# Patient Record
Sex: Female | Born: 1970
Health system: Southern US, Community
[De-identification: ages and names within clinical notes are randomized; demographics above are authoritative.]

## PROBLEM LIST (undated history)

## (undated) DIAGNOSIS — K219 Gastro-esophageal reflux disease without esophagitis: Secondary | ICD-10-CM

## (undated) DIAGNOSIS — D573 Sickle-cell trait: Secondary | ICD-10-CM

## (undated) DIAGNOSIS — K279 Peptic ulcer, site unspecified, unspecified as acute or chronic, without hemorrhage or perforation: Secondary | ICD-10-CM

## (undated) DIAGNOSIS — Z5189 Encounter for other specified aftercare: Secondary | ICD-10-CM

## (undated) DIAGNOSIS — D649 Anemia, unspecified: Secondary | ICD-10-CM

## (undated) DIAGNOSIS — G8929 Other chronic pain: Secondary | ICD-10-CM

## (undated) DIAGNOSIS — IMO0002 Reserved for concepts with insufficient information to code with codable children: Secondary | ICD-10-CM

## (undated) DIAGNOSIS — J45909 Unspecified asthma, uncomplicated: Secondary | ICD-10-CM

## (undated) HISTORY — PX: MYRINGOTOMY: SUR874

## (undated) HISTORY — PX: HEMORRHOID SURGERY: SHX153

---

## 1998-01-19 ENCOUNTER — Emergency Department (HOSPITAL_COMMUNITY): Admission: EM | Admit: 1998-01-19 | Discharge: 1998-01-19 | Payer: Self-pay | Admitting: Emergency Medicine

## 1998-03-19 ENCOUNTER — Emergency Department (HOSPITAL_COMMUNITY): Admission: EM | Admit: 1998-03-19 | Discharge: 1998-03-19 | Payer: Self-pay | Admitting: Emergency Medicine

## 1998-05-11 ENCOUNTER — Encounter: Payer: Self-pay | Admitting: Emergency Medicine

## 1998-05-11 ENCOUNTER — Emergency Department (HOSPITAL_COMMUNITY): Admission: EM | Admit: 1998-05-11 | Discharge: 1998-05-11 | Payer: Self-pay | Admitting: Emergency Medicine

## 1998-07-25 ENCOUNTER — Emergency Department (HOSPITAL_COMMUNITY): Admission: EM | Admit: 1998-07-25 | Discharge: 1998-07-25 | Payer: Self-pay | Admitting: Emergency Medicine

## 1998-10-28 ENCOUNTER — Ambulatory Visit (HOSPITAL_COMMUNITY): Admission: RE | Admit: 1998-10-28 | Discharge: 1998-10-28 | Payer: Self-pay | Admitting: Internal Medicine

## 1998-10-28 ENCOUNTER — Encounter: Payer: Self-pay | Admitting: Internal Medicine

## 1998-12-06 ENCOUNTER — Emergency Department (HOSPITAL_COMMUNITY): Admission: EM | Admit: 1998-12-06 | Discharge: 1998-12-07 | Payer: Self-pay

## 1999-02-18 ENCOUNTER — Inpatient Hospital Stay (HOSPITAL_COMMUNITY): Admission: AD | Admit: 1999-02-18 | Discharge: 1999-02-18 | Payer: Self-pay | Admitting: Obstetrics

## 1999-05-06 ENCOUNTER — Emergency Department (HOSPITAL_COMMUNITY): Admission: EM | Admit: 1999-05-06 | Discharge: 1999-05-06 | Payer: Self-pay | Admitting: Emergency Medicine

## 1999-05-09 ENCOUNTER — Emergency Department (HOSPITAL_COMMUNITY): Admission: EM | Admit: 1999-05-09 | Discharge: 1999-05-09 | Payer: Self-pay

## 1999-05-13 ENCOUNTER — Emergency Department (HOSPITAL_COMMUNITY): Admission: EM | Admit: 1999-05-13 | Discharge: 1999-05-13 | Payer: Self-pay | Admitting: Emergency Medicine

## 1999-05-20 ENCOUNTER — Emergency Department (HOSPITAL_COMMUNITY): Admission: EM | Admit: 1999-05-20 | Discharge: 1999-05-20 | Payer: Self-pay | Admitting: Emergency Medicine

## 1999-07-19 ENCOUNTER — Emergency Department (HOSPITAL_COMMUNITY): Admission: EM | Admit: 1999-07-19 | Discharge: 1999-07-19 | Payer: Self-pay | Admitting: *Deleted

## 1999-09-02 ENCOUNTER — Emergency Department (HOSPITAL_COMMUNITY): Admission: EM | Admit: 1999-09-02 | Discharge: 1999-09-03 | Payer: Self-pay | Admitting: Emergency Medicine

## 1999-09-15 ENCOUNTER — Emergency Department (HOSPITAL_COMMUNITY): Admission: EM | Admit: 1999-09-15 | Discharge: 1999-09-15 | Payer: Self-pay | Admitting: Emergency Medicine

## 1999-12-12 ENCOUNTER — Emergency Department (HOSPITAL_COMMUNITY): Admission: EM | Admit: 1999-12-12 | Discharge: 1999-12-13 | Payer: Self-pay | Admitting: Internal Medicine

## 1999-12-13 ENCOUNTER — Encounter: Payer: Self-pay | Admitting: Emergency Medicine

## 2000-03-04 ENCOUNTER — Emergency Department (HOSPITAL_COMMUNITY): Admission: EM | Admit: 2000-03-04 | Discharge: 2000-03-04 | Payer: Self-pay | Admitting: Emergency Medicine

## 2000-09-29 ENCOUNTER — Emergency Department (HOSPITAL_COMMUNITY): Admission: EM | Admit: 2000-09-29 | Discharge: 2000-09-29 | Payer: Self-pay | Admitting: Emergency Medicine

## 2000-12-15 ENCOUNTER — Encounter: Payer: Self-pay | Admitting: Emergency Medicine

## 2000-12-15 ENCOUNTER — Emergency Department (HOSPITAL_COMMUNITY): Admission: EM | Admit: 2000-12-15 | Discharge: 2000-12-15 | Payer: Self-pay | Admitting: Emergency Medicine

## 2001-01-18 ENCOUNTER — Encounter: Payer: Self-pay | Admitting: Emergency Medicine

## 2001-01-18 ENCOUNTER — Emergency Department (HOSPITAL_COMMUNITY): Admission: EM | Admit: 2001-01-18 | Discharge: 2001-01-18 | Payer: Self-pay | Admitting: Emergency Medicine

## 2001-01-23 ENCOUNTER — Emergency Department (HOSPITAL_COMMUNITY): Admission: EM | Admit: 2001-01-23 | Discharge: 2001-01-23 | Payer: Self-pay | Admitting: Emergency Medicine

## 2001-01-27 ENCOUNTER — Emergency Department (HOSPITAL_COMMUNITY): Admission: EM | Admit: 2001-01-27 | Discharge: 2001-01-27 | Payer: Self-pay | Admitting: *Deleted

## 2001-04-06 ENCOUNTER — Emergency Department (HOSPITAL_COMMUNITY): Admission: EM | Admit: 2001-04-06 | Discharge: 2001-04-06 | Payer: Self-pay | Admitting: Emergency Medicine

## 2001-04-13 ENCOUNTER — Encounter: Payer: Self-pay | Admitting: Emergency Medicine

## 2001-04-13 ENCOUNTER — Emergency Department (HOSPITAL_COMMUNITY): Admission: EM | Admit: 2001-04-13 | Discharge: 2001-04-13 | Payer: Self-pay | Admitting: Emergency Medicine

## 2001-07-05 ENCOUNTER — Emergency Department (HOSPITAL_COMMUNITY): Admission: EM | Admit: 2001-07-05 | Discharge: 2001-07-05 | Payer: Self-pay | Admitting: Emergency Medicine

## 2002-02-28 ENCOUNTER — Emergency Department (HOSPITAL_COMMUNITY): Admission: EM | Admit: 2002-02-28 | Discharge: 2002-02-28 | Payer: Self-pay | Admitting: Emergency Medicine

## 2002-05-12 ENCOUNTER — Emergency Department (HOSPITAL_COMMUNITY): Admission: EM | Admit: 2002-05-12 | Discharge: 2002-05-12 | Payer: Self-pay | Admitting: Emergency Medicine

## 2002-11-15 ENCOUNTER — Encounter: Payer: Self-pay | Admitting: Emergency Medicine

## 2002-11-15 ENCOUNTER — Emergency Department (HOSPITAL_COMMUNITY): Admission: EM | Admit: 2002-11-15 | Discharge: 2002-11-15 | Payer: Self-pay | Admitting: Emergency Medicine

## 2002-11-20 ENCOUNTER — Emergency Department (HOSPITAL_COMMUNITY): Admission: EM | Admit: 2002-11-20 | Discharge: 2002-11-20 | Payer: Self-pay | Admitting: Emergency Medicine

## 2002-12-08 ENCOUNTER — Emergency Department (HOSPITAL_COMMUNITY): Admission: EM | Admit: 2002-12-08 | Discharge: 2002-12-08 | Payer: Self-pay

## 2002-12-13 ENCOUNTER — Emergency Department (HOSPITAL_COMMUNITY): Admission: EM | Admit: 2002-12-13 | Discharge: 2002-12-13 | Payer: Self-pay | Admitting: Emergency Medicine

## 2003-01-01 ENCOUNTER — Emergency Department (HOSPITAL_COMMUNITY): Admission: EM | Admit: 2003-01-01 | Discharge: 2003-01-01 | Payer: Self-pay | Admitting: Emergency Medicine

## 2003-01-07 ENCOUNTER — Emergency Department (HOSPITAL_COMMUNITY): Admission: EM | Admit: 2003-01-07 | Discharge: 2003-01-07 | Payer: Self-pay | Admitting: Emergency Medicine

## 2003-01-08 ENCOUNTER — Emergency Department (HOSPITAL_COMMUNITY): Admission: EM | Admit: 2003-01-08 | Discharge: 2003-01-08 | Payer: Self-pay | Admitting: Emergency Medicine

## 2003-01-10 ENCOUNTER — Emergency Department (HOSPITAL_COMMUNITY): Admission: EM | Admit: 2003-01-10 | Discharge: 2003-01-10 | Payer: Self-pay | Admitting: Emergency Medicine

## 2003-02-04 ENCOUNTER — Emergency Department (HOSPITAL_COMMUNITY): Admission: EM | Admit: 2003-02-04 | Discharge: 2003-02-04 | Payer: Self-pay | Admitting: Emergency Medicine

## 2003-02-08 ENCOUNTER — Emergency Department (HOSPITAL_COMMUNITY): Admission: EM | Admit: 2003-02-08 | Discharge: 2003-02-08 | Payer: Self-pay | Admitting: Emergency Medicine

## 2003-03-03 ENCOUNTER — Emergency Department (HOSPITAL_COMMUNITY): Admission: EM | Admit: 2003-03-03 | Discharge: 2003-03-03 | Payer: Self-pay | Admitting: Emergency Medicine

## 2003-04-22 ENCOUNTER — Emergency Department (HOSPITAL_COMMUNITY): Admission: EM | Admit: 2003-04-22 | Discharge: 2003-04-22 | Payer: Self-pay | Admitting: Emergency Medicine

## 2003-07-20 ENCOUNTER — Emergency Department (HOSPITAL_COMMUNITY): Admission: EM | Admit: 2003-07-20 | Discharge: 2003-07-20 | Payer: Self-pay | Admitting: Emergency Medicine

## 2003-08-01 ENCOUNTER — Emergency Department (HOSPITAL_COMMUNITY): Admission: EM | Admit: 2003-08-01 | Discharge: 2003-08-01 | Payer: Self-pay | Admitting: Emergency Medicine

## 2003-10-23 ENCOUNTER — Emergency Department (HOSPITAL_COMMUNITY): Admission: EM | Admit: 2003-10-23 | Discharge: 2003-10-24 | Payer: Self-pay | Admitting: Emergency Medicine

## 2003-10-24 ENCOUNTER — Emergency Department (HOSPITAL_COMMUNITY): Admission: EM | Admit: 2003-10-24 | Discharge: 2003-10-25 | Payer: Self-pay | Admitting: Emergency Medicine

## 2003-11-15 ENCOUNTER — Emergency Department (HOSPITAL_COMMUNITY): Admission: EM | Admit: 2003-11-15 | Discharge: 2003-11-15 | Payer: Self-pay | Admitting: Emergency Medicine

## 2003-11-28 ENCOUNTER — Emergency Department (HOSPITAL_COMMUNITY): Admission: EM | Admit: 2003-11-28 | Discharge: 2003-11-28 | Payer: Self-pay | Admitting: Emergency Medicine

## 2004-01-10 ENCOUNTER — Emergency Department (HOSPITAL_COMMUNITY): Admission: EM | Admit: 2004-01-10 | Discharge: 2004-01-11 | Payer: Self-pay | Admitting: Emergency Medicine

## 2004-01-11 ENCOUNTER — Inpatient Hospital Stay (HOSPITAL_COMMUNITY): Admission: EM | Admit: 2004-01-11 | Discharge: 2004-01-12 | Payer: Self-pay | Admitting: Psychiatry

## 2004-04-07 ENCOUNTER — Emergency Department (HOSPITAL_COMMUNITY): Admission: EM | Admit: 2004-04-07 | Discharge: 2004-04-07 | Payer: Self-pay | Admitting: Emergency Medicine

## 2004-04-12 ENCOUNTER — Emergency Department (HOSPITAL_COMMUNITY): Admission: EM | Admit: 2004-04-12 | Discharge: 2004-04-12 | Payer: Self-pay | Admitting: Emergency Medicine

## 2004-04-18 ENCOUNTER — Emergency Department (HOSPITAL_COMMUNITY): Admission: EM | Admit: 2004-04-18 | Discharge: 2004-04-18 | Payer: Self-pay | Admitting: *Deleted

## 2004-08-21 ENCOUNTER — Emergency Department (HOSPITAL_COMMUNITY): Admission: EM | Admit: 2004-08-21 | Discharge: 2004-08-21 | Payer: Self-pay | Admitting: Emergency Medicine

## 2004-09-02 ENCOUNTER — Emergency Department (HOSPITAL_COMMUNITY): Admission: EM | Admit: 2004-09-02 | Discharge: 2004-09-02 | Payer: Self-pay | Admitting: Emergency Medicine

## 2004-09-10 ENCOUNTER — Emergency Department (HOSPITAL_COMMUNITY): Admission: EM | Admit: 2004-09-10 | Discharge: 2004-09-10 | Payer: Self-pay | Admitting: *Deleted

## 2004-09-19 ENCOUNTER — Emergency Department (HOSPITAL_COMMUNITY): Admission: EM | Admit: 2004-09-19 | Discharge: 2004-09-20 | Payer: Self-pay | Admitting: Specialist

## 2004-09-21 ENCOUNTER — Emergency Department (HOSPITAL_COMMUNITY): Admission: EM | Admit: 2004-09-21 | Discharge: 2004-09-21 | Payer: Self-pay | Admitting: Family Medicine

## 2004-10-08 ENCOUNTER — Ambulatory Visit: Payer: Self-pay | Admitting: Family Medicine

## 2004-10-10 ENCOUNTER — Ambulatory Visit: Payer: Self-pay | Admitting: *Deleted

## 2004-10-14 ENCOUNTER — Ambulatory Visit: Payer: Self-pay | Admitting: Family Medicine

## 2004-10-16 ENCOUNTER — Emergency Department (HOSPITAL_COMMUNITY): Admission: EM | Admit: 2004-10-16 | Discharge: 2004-10-16 | Payer: Self-pay | Admitting: Emergency Medicine

## 2004-10-23 ENCOUNTER — Ambulatory Visit: Payer: Self-pay | Admitting: Family Medicine

## 2004-10-26 ENCOUNTER — Emergency Department (HOSPITAL_COMMUNITY): Admission: EM | Admit: 2004-10-26 | Discharge: 2004-10-26 | Payer: Self-pay | Admitting: Emergency Medicine

## 2004-11-25 ENCOUNTER — Ambulatory Visit: Payer: Self-pay | Admitting: Family Medicine

## 2004-12-31 ENCOUNTER — Ambulatory Visit: Payer: Self-pay | Admitting: Family Medicine

## 2005-01-24 ENCOUNTER — Emergency Department (HOSPITAL_COMMUNITY): Admission: EM | Admit: 2005-01-24 | Discharge: 2005-01-24 | Payer: Self-pay | Admitting: Emergency Medicine

## 2005-01-26 ENCOUNTER — Emergency Department (HOSPITAL_COMMUNITY): Admission: EM | Admit: 2005-01-26 | Discharge: 2005-01-26 | Payer: Self-pay | Admitting: Emergency Medicine

## 2005-02-28 ENCOUNTER — Ambulatory Visit: Payer: Self-pay | Admitting: Internal Medicine

## 2005-04-01 ENCOUNTER — Emergency Department (HOSPITAL_COMMUNITY): Admission: EM | Admit: 2005-04-01 | Discharge: 2005-04-01 | Payer: Self-pay | Admitting: Emergency Medicine

## 2005-04-14 ENCOUNTER — Emergency Department (HOSPITAL_COMMUNITY): Admission: EM | Admit: 2005-04-14 | Discharge: 2005-04-15 | Payer: Self-pay | Admitting: Emergency Medicine

## 2005-04-25 ENCOUNTER — Ambulatory Visit: Payer: Self-pay | Admitting: Internal Medicine

## 2005-05-08 ENCOUNTER — Emergency Department (HOSPITAL_COMMUNITY): Admission: EM | Admit: 2005-05-08 | Discharge: 2005-05-08 | Payer: Self-pay | Admitting: Emergency Medicine

## 2005-05-17 ENCOUNTER — Emergency Department (HOSPITAL_COMMUNITY): Admission: EM | Admit: 2005-05-17 | Discharge: 2005-05-17 | Payer: Self-pay | Admitting: Emergency Medicine

## 2005-07-31 ENCOUNTER — Emergency Department (HOSPITAL_COMMUNITY): Admission: EM | Admit: 2005-07-31 | Discharge: 2005-07-31 | Payer: Self-pay | Admitting: Emergency Medicine

## 2005-08-09 ENCOUNTER — Emergency Department (HOSPITAL_COMMUNITY): Admission: EM | Admit: 2005-08-09 | Discharge: 2005-08-09 | Payer: Self-pay | Admitting: Emergency Medicine

## 2005-11-13 ENCOUNTER — Emergency Department (HOSPITAL_COMMUNITY): Admission: EM | Admit: 2005-11-13 | Discharge: 2005-11-13 | Payer: Self-pay | Admitting: Emergency Medicine

## 2005-11-17 ENCOUNTER — Emergency Department (HOSPITAL_COMMUNITY): Admission: EM | Admit: 2005-11-17 | Discharge: 2005-11-17 | Payer: Self-pay | Admitting: Emergency Medicine

## 2005-12-22 ENCOUNTER — Emergency Department (HOSPITAL_COMMUNITY): Admission: EM | Admit: 2005-12-22 | Discharge: 2005-12-22 | Payer: Self-pay | Admitting: Emergency Medicine

## 2006-01-12 ENCOUNTER — Emergency Department (HOSPITAL_COMMUNITY): Admission: EM | Admit: 2006-01-12 | Discharge: 2006-01-12 | Payer: Self-pay | Admitting: Emergency Medicine

## 2006-02-11 ENCOUNTER — Ambulatory Visit: Payer: Self-pay | Admitting: Internal Medicine

## 2006-02-24 ENCOUNTER — Emergency Department (HOSPITAL_COMMUNITY): Admission: EM | Admit: 2006-02-24 | Discharge: 2006-02-25 | Payer: Self-pay | Admitting: Emergency Medicine

## 2006-02-24 ENCOUNTER — Emergency Department (HOSPITAL_COMMUNITY): Admission: EM | Admit: 2006-02-24 | Discharge: 2006-02-24 | Payer: Self-pay | Admitting: Emergency Medicine

## 2006-03-04 ENCOUNTER — Emergency Department (HOSPITAL_COMMUNITY): Admission: EM | Admit: 2006-03-04 | Discharge: 2006-03-04 | Payer: Self-pay | Admitting: Emergency Medicine

## 2006-03-11 ENCOUNTER — Emergency Department (HOSPITAL_COMMUNITY): Admission: EM | Admit: 2006-03-11 | Discharge: 2006-03-11 | Payer: Self-pay | Admitting: Emergency Medicine

## 2006-03-25 ENCOUNTER — Ambulatory Visit: Payer: Self-pay | Admitting: Family Medicine

## 2006-07-09 ENCOUNTER — Emergency Department (HOSPITAL_COMMUNITY): Admission: EM | Admit: 2006-07-09 | Discharge: 2006-07-09 | Payer: Self-pay | Admitting: Emergency Medicine

## 2006-07-13 ENCOUNTER — Emergency Department (HOSPITAL_COMMUNITY): Admission: EM | Admit: 2006-07-13 | Discharge: 2006-07-13 | Payer: Self-pay | Admitting: Emergency Medicine

## 2006-07-20 ENCOUNTER — Emergency Department (HOSPITAL_COMMUNITY): Admission: EM | Admit: 2006-07-20 | Discharge: 2006-07-20 | Payer: Self-pay | Admitting: Emergency Medicine

## 2006-08-02 ENCOUNTER — Emergency Department (HOSPITAL_COMMUNITY): Admission: EM | Admit: 2006-08-02 | Discharge: 2006-08-02 | Payer: Self-pay | Admitting: Emergency Medicine

## 2006-08-11 ENCOUNTER — Emergency Department (HOSPITAL_COMMUNITY): Admission: EM | Admit: 2006-08-11 | Discharge: 2006-08-11 | Payer: Self-pay | Admitting: Emergency Medicine

## 2006-08-12 ENCOUNTER — Emergency Department (HOSPITAL_COMMUNITY): Admission: EM | Admit: 2006-08-12 | Discharge: 2006-08-12 | Payer: Self-pay | Admitting: Family Medicine

## 2006-11-21 ENCOUNTER — Emergency Department (HOSPITAL_COMMUNITY): Admission: EM | Admit: 2006-11-21 | Discharge: 2006-11-21 | Payer: Self-pay | Admitting: Emergency Medicine

## 2006-12-06 ENCOUNTER — Emergency Department (HOSPITAL_COMMUNITY): Admission: EM | Admit: 2006-12-06 | Discharge: 2006-12-06 | Payer: Self-pay | Admitting: Emergency Medicine

## 2006-12-07 ENCOUNTER — Ambulatory Visit: Payer: Self-pay | Admitting: Internal Medicine

## 2006-12-08 LAB — CONVERTED CEMR LAB
ALT: 8 units/L
AST: 15 units/L
Albumin: 4.4 g/dL
Alkaline Phosphatase: 75 units/L
BUN: 12 mg/dL
Basophils Absolute: 0 10*3/uL
Basophils Relative: 1 %
CO2: 16 meq/L
Calcium: 9.4 mg/dL
Chloride: 111 meq/L
Cholesterol: 186 mg/dL
Creatinine, Ser: 0.54 mg/dL
Eosinophils Absolute: 0 10*3/uL
Eosinophils Relative: 1 %
Glucose, Bld: 68 mg/dL
HCT: 38.4 %
HDL: 55 mg/dL
Hemoglobin: 13 g/dL
LDL Cholesterol: 120 mg/dL
Lymphocytes Relative: 41 %
Lymphs Abs: 2.6 10*3/uL
MCHC: 33.9 g/dL
MCV: 90.1 fL
Monocytes Absolute: 1 10*3/uL
Monocytes Relative: 0.4 %
Neutro Abs: 3.2 10*3/uL
Neutrophils Relative %: 51 %
Platelets: 424 10*3/uL
Potassium: 4.1 meq/L
RBC: 4.26 M/uL
RDW: 14.6 %
Sodium: 142 meq/L
Total Bilirubin: 0.4 mg/dL
Total CHOL/HDL Ratio: 3.4
Total Protein: 7.3 g/dL
Triglycerides: 53 mg/dL
VLDL: 11 mg/dL
WBC: 6.3 10*3/uL

## 2007-01-05 ENCOUNTER — Ambulatory Visit: Payer: Self-pay | Admitting: Internal Medicine

## 2007-02-05 ENCOUNTER — Emergency Department (HOSPITAL_COMMUNITY): Admission: EM | Admit: 2007-02-05 | Discharge: 2007-02-05 | Payer: Self-pay | Admitting: Emergency Medicine

## 2007-02-13 ENCOUNTER — Emergency Department (HOSPITAL_COMMUNITY): Admission: EM | Admit: 2007-02-13 | Discharge: 2007-02-13 | Payer: Self-pay | Admitting: Emergency Medicine

## 2007-02-16 ENCOUNTER — Emergency Department (HOSPITAL_COMMUNITY): Admission: EM | Admit: 2007-02-16 | Discharge: 2007-02-16 | Payer: Self-pay | Admitting: Emergency Medicine

## 2007-02-25 DIAGNOSIS — L259 Unspecified contact dermatitis, unspecified cause: Secondary | ICD-10-CM | POA: Insufficient documentation

## 2007-02-25 DIAGNOSIS — G43809 Other migraine, not intractable, without status migrainosus: Secondary | ICD-10-CM | POA: Insufficient documentation

## 2007-03-01 ENCOUNTER — Telehealth (INDEPENDENT_AMBULATORY_CARE_PROVIDER_SITE_OTHER): Payer: Self-pay | Admitting: Internal Medicine

## 2007-03-03 ENCOUNTER — Encounter (INDEPENDENT_AMBULATORY_CARE_PROVIDER_SITE_OTHER): Payer: Self-pay | Admitting: *Deleted

## 2007-03-06 ENCOUNTER — Emergency Department (HOSPITAL_COMMUNITY): Admission: EM | Admit: 2007-03-06 | Discharge: 2007-03-06 | Payer: Self-pay | Admitting: Emergency Medicine

## 2007-03-09 ENCOUNTER — Ambulatory Visit: Payer: Self-pay | Admitting: Nurse Practitioner

## 2007-03-09 DIAGNOSIS — K047 Periapical abscess without sinus: Secondary | ICD-10-CM | POA: Insufficient documentation

## 2007-03-09 DIAGNOSIS — F17200 Nicotine dependence, unspecified, uncomplicated: Secondary | ICD-10-CM | POA: Insufficient documentation

## 2007-03-09 DIAGNOSIS — K219 Gastro-esophageal reflux disease without esophagitis: Secondary | ICD-10-CM | POA: Insufficient documentation

## 2007-03-09 DIAGNOSIS — G43909 Migraine, unspecified, not intractable, without status migrainosus: Secondary | ICD-10-CM | POA: Insufficient documentation

## 2007-03-24 ENCOUNTER — Telehealth (INDEPENDENT_AMBULATORY_CARE_PROVIDER_SITE_OTHER): Payer: Self-pay | Admitting: Internal Medicine

## 2007-04-09 ENCOUNTER — Telehealth (INDEPENDENT_AMBULATORY_CARE_PROVIDER_SITE_OTHER): Payer: Self-pay | Admitting: Nurse Practitioner

## 2007-04-10 ENCOUNTER — Emergency Department (HOSPITAL_COMMUNITY): Admission: EM | Admit: 2007-04-10 | Discharge: 2007-04-10 | Payer: Self-pay | Admitting: Emergency Medicine

## 2007-04-14 ENCOUNTER — Ambulatory Visit: Payer: Self-pay | Admitting: Nurse Practitioner

## 2007-04-14 LAB — CONVERTED CEMR LAB
Bilirubin Urine: NEGATIVE
Chlamydia, DNA Probe: NEGATIVE
GC Probe Amp, Genital: NEGATIVE
Glucose, Urine, Semiquant: NEGATIVE
KOH Prep: NEGATIVE
Ketones, urine, test strip: NEGATIVE
Nitrite: NEGATIVE
Protein, U semiquant: NEGATIVE
Specific Gravity, Urine: 1.01
Urobilinogen, UA: NEGATIVE
WBC Urine, dipstick: NEGATIVE
Whiff Test: NEGATIVE
pH: 6.5

## 2007-04-15 ENCOUNTER — Encounter (INDEPENDENT_AMBULATORY_CARE_PROVIDER_SITE_OTHER): Payer: Self-pay | Admitting: Nurse Practitioner

## 2007-04-15 LAB — CONVERTED CEMR LAB: Pap Smear: NORMAL

## 2007-04-17 ENCOUNTER — Emergency Department (HOSPITAL_COMMUNITY): Admission: EM | Admit: 2007-04-17 | Discharge: 2007-04-17 | Payer: Self-pay | Admitting: Emergency Medicine

## 2007-04-20 ENCOUNTER — Encounter (INDEPENDENT_AMBULATORY_CARE_PROVIDER_SITE_OTHER): Payer: Self-pay | Admitting: Nurse Practitioner

## 2007-06-22 ENCOUNTER — Telehealth (INDEPENDENT_AMBULATORY_CARE_PROVIDER_SITE_OTHER): Payer: Self-pay | Admitting: *Deleted

## 2007-07-22 ENCOUNTER — Ambulatory Visit: Payer: Self-pay | Admitting: Nurse Practitioner

## 2007-07-22 ENCOUNTER — Emergency Department (HOSPITAL_COMMUNITY): Admission: EM | Admit: 2007-07-22 | Discharge: 2007-07-22 | Payer: Self-pay | Admitting: Emergency Medicine

## 2007-07-23 ENCOUNTER — Emergency Department (HOSPITAL_COMMUNITY): Admission: EM | Admit: 2007-07-23 | Discharge: 2007-07-23 | Payer: Self-pay | Admitting: Emergency Medicine

## 2007-07-24 ENCOUNTER — Emergency Department (HOSPITAL_COMMUNITY): Admission: EM | Admit: 2007-07-24 | Discharge: 2007-07-24 | Payer: Self-pay | Admitting: Emergency Medicine

## 2007-08-27 ENCOUNTER — Ambulatory Visit: Payer: Self-pay | Admitting: Nurse Practitioner

## 2007-08-27 DIAGNOSIS — R0602 Shortness of breath: Secondary | ICD-10-CM | POA: Insufficient documentation

## 2007-10-12 ENCOUNTER — Encounter (INDEPENDENT_AMBULATORY_CARE_PROVIDER_SITE_OTHER): Payer: Self-pay | Admitting: Nurse Practitioner

## 2007-12-07 ENCOUNTER — Emergency Department (HOSPITAL_COMMUNITY): Admission: EM | Admit: 2007-12-07 | Discharge: 2007-12-07 | Payer: Self-pay | Admitting: Emergency Medicine

## 2007-12-09 ENCOUNTER — Emergency Department (HOSPITAL_COMMUNITY): Admission: EM | Admit: 2007-12-09 | Discharge: 2007-12-09 | Payer: Self-pay | Admitting: Emergency Medicine

## 2007-12-13 ENCOUNTER — Emergency Department (HOSPITAL_COMMUNITY): Admission: EM | Admit: 2007-12-13 | Discharge: 2007-12-13 | Payer: Self-pay | Admitting: Emergency Medicine

## 2007-12-14 ENCOUNTER — Emergency Department (HOSPITAL_COMMUNITY): Admission: EM | Admit: 2007-12-14 | Discharge: 2007-12-14 | Payer: Self-pay | Admitting: Emergency Medicine

## 2007-12-19 ENCOUNTER — Emergency Department (HOSPITAL_COMMUNITY): Admission: EM | Admit: 2007-12-19 | Discharge: 2007-12-19 | Payer: Self-pay | Admitting: Emergency Medicine

## 2008-01-12 ENCOUNTER — Telehealth (INDEPENDENT_AMBULATORY_CARE_PROVIDER_SITE_OTHER): Payer: Self-pay | Admitting: Nurse Practitioner

## 2008-01-15 ENCOUNTER — Emergency Department (HOSPITAL_COMMUNITY): Admission: EM | Admit: 2008-01-15 | Discharge: 2008-01-15 | Payer: Self-pay | Admitting: Emergency Medicine

## 2008-01-20 ENCOUNTER — Ambulatory Visit: Payer: Self-pay | Admitting: Nurse Practitioner

## 2008-01-20 DIAGNOSIS — K5900 Constipation, unspecified: Secondary | ICD-10-CM | POA: Insufficient documentation

## 2008-01-21 ENCOUNTER — Ambulatory Visit (HOSPITAL_COMMUNITY): Admission: RE | Admit: 2008-01-21 | Discharge: 2008-01-21 | Payer: Self-pay | Admitting: Nurse Practitioner

## 2008-01-24 ENCOUNTER — Ambulatory Visit (HOSPITAL_COMMUNITY): Admission: RE | Admit: 2008-01-24 | Discharge: 2008-01-24 | Payer: Self-pay | Admitting: Neurosurgery

## 2008-01-25 ENCOUNTER — Telehealth (INDEPENDENT_AMBULATORY_CARE_PROVIDER_SITE_OTHER): Payer: Self-pay | Admitting: Nurse Practitioner

## 2008-03-23 ENCOUNTER — Ambulatory Visit: Payer: Self-pay | Admitting: Nurse Practitioner

## 2008-03-23 DIAGNOSIS — J309 Allergic rhinitis, unspecified: Secondary | ICD-10-CM | POA: Insufficient documentation

## 2008-04-04 ENCOUNTER — Encounter (INDEPENDENT_AMBULATORY_CARE_PROVIDER_SITE_OTHER): Payer: Self-pay | Admitting: Nurse Practitioner

## 2008-04-12 ENCOUNTER — Encounter (INDEPENDENT_AMBULATORY_CARE_PROVIDER_SITE_OTHER): Payer: Self-pay | Admitting: Nurse Practitioner

## 2008-04-12 ENCOUNTER — Ambulatory Visit (HOSPITAL_COMMUNITY): Admission: RE | Admit: 2008-04-12 | Discharge: 2008-04-12 | Payer: Self-pay | Admitting: Gastroenterology

## 2008-04-21 ENCOUNTER — Telehealth (INDEPENDENT_AMBULATORY_CARE_PROVIDER_SITE_OTHER): Payer: Self-pay | Admitting: Nurse Practitioner

## 2008-05-16 ENCOUNTER — Telehealth (INDEPENDENT_AMBULATORY_CARE_PROVIDER_SITE_OTHER): Payer: Self-pay | Admitting: Nurse Practitioner

## 2008-06-06 ENCOUNTER — Encounter (INDEPENDENT_AMBULATORY_CARE_PROVIDER_SITE_OTHER): Payer: Self-pay | Admitting: Nurse Practitioner

## 2008-06-07 ENCOUNTER — Encounter (INDEPENDENT_AMBULATORY_CARE_PROVIDER_SITE_OTHER): Payer: Self-pay | Admitting: Nurse Practitioner

## 2008-06-07 DIAGNOSIS — R1013 Epigastric pain: Secondary | ICD-10-CM

## 2008-06-07 DIAGNOSIS — K3189 Other diseases of stomach and duodenum: Secondary | ICD-10-CM | POA: Insufficient documentation

## 2008-06-14 ENCOUNTER — Encounter (INDEPENDENT_AMBULATORY_CARE_PROVIDER_SITE_OTHER): Payer: Self-pay | Admitting: *Deleted

## 2008-06-26 ENCOUNTER — Telehealth (INDEPENDENT_AMBULATORY_CARE_PROVIDER_SITE_OTHER): Payer: Self-pay | Admitting: Nurse Practitioner

## 2008-07-05 ENCOUNTER — Encounter (INDEPENDENT_AMBULATORY_CARE_PROVIDER_SITE_OTHER): Payer: Self-pay | Admitting: Nurse Practitioner

## 2008-07-05 ENCOUNTER — Ambulatory Visit (HOSPITAL_COMMUNITY): Admission: RE | Admit: 2008-07-05 | Discharge: 2008-07-05 | Payer: Self-pay | Admitting: Internal Medicine

## 2008-07-07 ENCOUNTER — Ambulatory Visit: Payer: Self-pay | Admitting: Nurse Practitioner

## 2008-07-14 ENCOUNTER — Emergency Department (HOSPITAL_COMMUNITY): Admission: EM | Admit: 2008-07-14 | Discharge: 2008-07-14 | Payer: Self-pay | Admitting: Nurse Practitioner

## 2008-09-19 ENCOUNTER — Emergency Department (HOSPITAL_COMMUNITY): Admission: EM | Admit: 2008-09-19 | Discharge: 2008-09-19 | Payer: Self-pay | Admitting: Emergency Medicine

## 2008-09-25 ENCOUNTER — Emergency Department (HOSPITAL_COMMUNITY): Admission: EM | Admit: 2008-09-25 | Discharge: 2008-09-25 | Payer: Self-pay | Admitting: Emergency Medicine

## 2008-09-26 ENCOUNTER — Telehealth (INDEPENDENT_AMBULATORY_CARE_PROVIDER_SITE_OTHER): Payer: Self-pay | Admitting: Nurse Practitioner

## 2008-09-28 ENCOUNTER — Ambulatory Visit: Payer: Self-pay | Admitting: Nurse Practitioner

## 2008-09-28 ENCOUNTER — Emergency Department (HOSPITAL_COMMUNITY): Admission: EM | Admit: 2008-09-28 | Discharge: 2008-09-28 | Payer: Self-pay | Admitting: Emergency Medicine

## 2008-10-03 ENCOUNTER — Telehealth (INDEPENDENT_AMBULATORY_CARE_PROVIDER_SITE_OTHER): Payer: Self-pay | Admitting: Nurse Practitioner

## 2008-10-03 ENCOUNTER — Emergency Department (HOSPITAL_COMMUNITY): Admission: EM | Admit: 2008-10-03 | Discharge: 2008-10-03 | Payer: Self-pay | Admitting: Emergency Medicine

## 2008-10-06 ENCOUNTER — Encounter (INDEPENDENT_AMBULATORY_CARE_PROVIDER_SITE_OTHER): Payer: Self-pay | Admitting: Nurse Practitioner

## 2008-10-20 ENCOUNTER — Telehealth (INDEPENDENT_AMBULATORY_CARE_PROVIDER_SITE_OTHER): Payer: Self-pay | Admitting: Nurse Practitioner

## 2008-10-23 ENCOUNTER — Emergency Department (HOSPITAL_COMMUNITY): Admission: EM | Admit: 2008-10-23 | Discharge: 2008-10-23 | Payer: Self-pay | Admitting: Emergency Medicine

## 2008-10-23 ENCOUNTER — Telehealth (INDEPENDENT_AMBULATORY_CARE_PROVIDER_SITE_OTHER): Payer: Self-pay | Admitting: Nurse Practitioner

## 2008-10-25 ENCOUNTER — Encounter (INDEPENDENT_AMBULATORY_CARE_PROVIDER_SITE_OTHER): Payer: Self-pay | Admitting: *Deleted

## 2008-10-26 ENCOUNTER — Encounter (INDEPENDENT_AMBULATORY_CARE_PROVIDER_SITE_OTHER): Payer: Self-pay | Admitting: Nurse Practitioner

## 2008-11-01 ENCOUNTER — Ambulatory Visit: Payer: Self-pay | Admitting: Nurse Practitioner

## 2008-11-01 DIAGNOSIS — E876 Hypokalemia: Secondary | ICD-10-CM | POA: Insufficient documentation

## 2008-11-01 LAB — CONVERTED CEMR LAB
BUN: 9 mg/dL (ref 6–23)
CO2: 24 meq/L (ref 19–32)
Calcium: 9.4 mg/dL (ref 8.4–10.5)
Chloride: 105 meq/L (ref 96–112)
Creatinine, Ser: 0.63 mg/dL (ref 0.40–1.20)
Glucose, Bld: 98 mg/dL (ref 70–99)
Potassium: 4.1 meq/L (ref 3.5–5.3)
Sodium: 140 meq/L (ref 135–145)

## 2008-11-02 ENCOUNTER — Encounter (INDEPENDENT_AMBULATORY_CARE_PROVIDER_SITE_OTHER): Payer: Self-pay | Admitting: Nurse Practitioner

## 2008-11-09 ENCOUNTER — Encounter (INDEPENDENT_AMBULATORY_CARE_PROVIDER_SITE_OTHER): Payer: Self-pay | Admitting: *Deleted

## 2008-11-16 ENCOUNTER — Telehealth (INDEPENDENT_AMBULATORY_CARE_PROVIDER_SITE_OTHER): Payer: Self-pay | Admitting: Nurse Practitioner

## 2008-11-27 ENCOUNTER — Telehealth (INDEPENDENT_AMBULATORY_CARE_PROVIDER_SITE_OTHER): Payer: Self-pay | Admitting: Nurse Practitioner

## 2009-01-09 ENCOUNTER — Emergency Department (HOSPITAL_COMMUNITY): Admission: EM | Admit: 2009-01-09 | Discharge: 2009-01-09 | Payer: Self-pay | Admitting: Emergency Medicine

## 2009-01-10 ENCOUNTER — Emergency Department (HOSPITAL_BASED_OUTPATIENT_CLINIC_OR_DEPARTMENT_OTHER): Admission: EM | Admit: 2009-01-10 | Discharge: 2009-01-10 | Payer: Self-pay | Admitting: Emergency Medicine

## 2009-01-12 ENCOUNTER — Emergency Department (HOSPITAL_COMMUNITY): Admission: EM | Admit: 2009-01-12 | Discharge: 2009-01-12 | Payer: Self-pay | Admitting: Emergency Medicine

## 2009-01-23 ENCOUNTER — Ambulatory Visit: Payer: Self-pay | Admitting: Nurse Practitioner

## 2009-01-23 DIAGNOSIS — G4700 Insomnia, unspecified: Secondary | ICD-10-CM | POA: Insufficient documentation

## 2009-01-24 ENCOUNTER — Telehealth (INDEPENDENT_AMBULATORY_CARE_PROVIDER_SITE_OTHER): Payer: Self-pay | Admitting: Nurse Practitioner

## 2009-01-28 ENCOUNTER — Emergency Department (HOSPITAL_COMMUNITY): Admission: EM | Admit: 2009-01-28 | Discharge: 2009-01-28 | Payer: Self-pay | Admitting: Emergency Medicine

## 2009-01-30 ENCOUNTER — Emergency Department (HOSPITAL_COMMUNITY): Admission: EM | Admit: 2009-01-30 | Discharge: 2009-01-30 | Payer: Self-pay | Admitting: Emergency Medicine

## 2009-02-26 ENCOUNTER — Encounter (INDEPENDENT_AMBULATORY_CARE_PROVIDER_SITE_OTHER): Payer: Self-pay | Admitting: Nurse Practitioner

## 2009-03-05 ENCOUNTER — Emergency Department (HOSPITAL_COMMUNITY): Admission: EM | Admit: 2009-03-05 | Discharge: 2009-03-05 | Payer: Self-pay | Admitting: Emergency Medicine

## 2009-03-09 ENCOUNTER — Emergency Department (HOSPITAL_COMMUNITY): Admission: EM | Admit: 2009-03-09 | Discharge: 2009-03-09 | Payer: Self-pay | Admitting: Emergency Medicine

## 2009-03-16 ENCOUNTER — Telehealth (INDEPENDENT_AMBULATORY_CARE_PROVIDER_SITE_OTHER): Payer: Self-pay | Admitting: Nurse Practitioner

## 2009-03-21 ENCOUNTER — Emergency Department (HOSPITAL_BASED_OUTPATIENT_CLINIC_OR_DEPARTMENT_OTHER): Admission: EM | Admit: 2009-03-21 | Discharge: 2009-03-22 | Payer: Self-pay | Admitting: Emergency Medicine

## 2009-03-22 ENCOUNTER — Ambulatory Visit: Payer: Self-pay | Admitting: Diagnostic Radiology

## 2009-03-22 ENCOUNTER — Ambulatory Visit: Payer: Self-pay | Admitting: Nurse Practitioner

## 2009-04-02 ENCOUNTER — Emergency Department (HOSPITAL_BASED_OUTPATIENT_CLINIC_OR_DEPARTMENT_OTHER): Admission: EM | Admit: 2009-04-02 | Discharge: 2009-04-02 | Payer: Self-pay | Admitting: Emergency Medicine

## 2009-04-13 ENCOUNTER — Encounter (INDEPENDENT_AMBULATORY_CARE_PROVIDER_SITE_OTHER): Payer: Self-pay | Admitting: Nurse Practitioner

## 2009-08-23 ENCOUNTER — Emergency Department (HOSPITAL_COMMUNITY): Admission: EM | Admit: 2009-08-23 | Discharge: 2009-08-24 | Payer: Self-pay | Admitting: Emergency Medicine

## 2009-09-06 ENCOUNTER — Ambulatory Visit: Payer: Self-pay | Admitting: Nurse Practitioner

## 2009-09-06 DIAGNOSIS — E669 Obesity, unspecified: Secondary | ICD-10-CM | POA: Insufficient documentation

## 2009-09-06 LAB — CONVERTED CEMR LAB
Bilirubin Urine: NEGATIVE
Glucose, Urine, Semiquant: NEGATIVE
Ketones, urine, test strip: NEGATIVE
Nitrite: NEGATIVE
Protein, U semiquant: NEGATIVE
Specific Gravity, Urine: 1.02
Urobilinogen, UA: 1
WBC Urine, dipstick: NEGATIVE
pH: 5.5

## 2009-09-12 ENCOUNTER — Encounter (INDEPENDENT_AMBULATORY_CARE_PROVIDER_SITE_OTHER): Payer: Self-pay | Admitting: Nurse Practitioner

## 2009-09-18 ENCOUNTER — Emergency Department (HOSPITAL_COMMUNITY): Admission: EM | Admit: 2009-09-18 | Discharge: 2009-09-18 | Payer: Self-pay | Admitting: Emergency Medicine

## 2009-09-19 ENCOUNTER — Encounter (INDEPENDENT_AMBULATORY_CARE_PROVIDER_SITE_OTHER): Payer: Self-pay | Admitting: *Deleted

## 2009-09-21 ENCOUNTER — Encounter (INDEPENDENT_AMBULATORY_CARE_PROVIDER_SITE_OTHER): Payer: Self-pay | Admitting: Nurse Practitioner

## 2009-09-22 ENCOUNTER — Emergency Department (HOSPITAL_BASED_OUTPATIENT_CLINIC_OR_DEPARTMENT_OTHER): Admission: EM | Admit: 2009-09-22 | Discharge: 2009-09-22 | Payer: Self-pay | Admitting: Emergency Medicine

## 2009-10-02 ENCOUNTER — Emergency Department (HOSPITAL_COMMUNITY): Admission: EM | Admit: 2009-10-02 | Discharge: 2009-10-02 | Payer: Self-pay | Admitting: Emergency Medicine

## 2009-10-02 ENCOUNTER — Emergency Department (HOSPITAL_BASED_OUTPATIENT_CLINIC_OR_DEPARTMENT_OTHER): Admission: EM | Admit: 2009-10-02 | Discharge: 2009-10-03 | Payer: Self-pay | Admitting: Emergency Medicine

## 2009-10-05 ENCOUNTER — Emergency Department (HOSPITAL_COMMUNITY): Admission: EM | Admit: 2009-10-05 | Discharge: 2009-10-06 | Payer: Self-pay | Admitting: Emergency Medicine

## 2009-10-09 ENCOUNTER — Telehealth (INDEPENDENT_AMBULATORY_CARE_PROVIDER_SITE_OTHER): Payer: Self-pay | Admitting: Nurse Practitioner

## 2009-10-12 ENCOUNTER — Emergency Department (HOSPITAL_COMMUNITY): Admission: EM | Admit: 2009-10-12 | Discharge: 2009-10-12 | Payer: Self-pay | Admitting: Emergency Medicine

## 2009-10-15 ENCOUNTER — Emergency Department (HOSPITAL_COMMUNITY): Admission: EM | Admit: 2009-10-15 | Discharge: 2009-10-15 | Payer: Self-pay | Admitting: Emergency Medicine

## 2009-10-20 ENCOUNTER — Emergency Department (HOSPITAL_BASED_OUTPATIENT_CLINIC_OR_DEPARTMENT_OTHER): Admission: EM | Admit: 2009-10-20 | Discharge: 2009-10-21 | Payer: Self-pay | Admitting: Emergency Medicine

## 2009-10-20 ENCOUNTER — Ambulatory Visit: Payer: Self-pay | Admitting: Diagnostic Radiology

## 2009-10-25 ENCOUNTER — Telehealth (INDEPENDENT_AMBULATORY_CARE_PROVIDER_SITE_OTHER): Payer: Self-pay | Admitting: Nurse Practitioner

## 2009-11-02 ENCOUNTER — Encounter (INDEPENDENT_AMBULATORY_CARE_PROVIDER_SITE_OTHER): Payer: Self-pay | Admitting: Nurse Practitioner

## 2010-03-01 ENCOUNTER — Ambulatory Visit: Payer: Self-pay | Admitting: Nurse Practitioner

## 2010-03-14 ENCOUNTER — Emergency Department (HOSPITAL_COMMUNITY): Admission: EM | Admit: 2010-03-14 | Discharge: 2010-03-14 | Payer: Self-pay | Admitting: Emergency Medicine

## 2010-03-25 ENCOUNTER — Ambulatory Visit: Payer: Self-pay | Admitting: Physician Assistant

## 2010-03-25 DIAGNOSIS — J209 Acute bronchitis, unspecified: Secondary | ICD-10-CM | POA: Insufficient documentation

## 2010-04-26 ENCOUNTER — Emergency Department (HOSPITAL_COMMUNITY)
Admission: EM | Admit: 2010-04-26 | Discharge: 2010-04-26 | Payer: Self-pay | Source: Home / Self Care | Admitting: Family Medicine

## 2010-06-18 ENCOUNTER — Emergency Department (HOSPITAL_COMMUNITY)
Admission: EM | Admit: 2010-06-18 | Discharge: 2010-06-18 | Payer: Self-pay | Source: Home / Self Care | Admitting: Emergency Medicine

## 2010-06-21 ENCOUNTER — Telehealth (INDEPENDENT_AMBULATORY_CARE_PROVIDER_SITE_OTHER): Payer: Self-pay | Admitting: Nurse Practitioner

## 2010-07-03 ENCOUNTER — Emergency Department (HOSPITAL_BASED_OUTPATIENT_CLINIC_OR_DEPARTMENT_OTHER)
Admission: EM | Admit: 2010-07-03 | Discharge: 2010-07-03 | Payer: Self-pay | Source: Home / Self Care | Admitting: Emergency Medicine

## 2010-07-07 ENCOUNTER — Encounter: Payer: Self-pay | Admitting: Family Medicine

## 2010-07-07 ENCOUNTER — Emergency Department (HOSPITAL_COMMUNITY)
Admission: EM | Admit: 2010-07-07 | Discharge: 2010-07-08 | Payer: Self-pay | Source: Home / Self Care | Admitting: Emergency Medicine

## 2010-07-12 ENCOUNTER — Emergency Department (HOSPITAL_BASED_OUTPATIENT_CLINIC_OR_DEPARTMENT_OTHER)
Admission: EM | Admit: 2010-07-12 | Discharge: 2010-07-12 | Payer: Self-pay | Source: Home / Self Care | Admitting: Emergency Medicine

## 2010-07-12 LAB — PREGNANCY, URINE: Preg Test, Ur: NEGATIVE

## 2010-07-13 ENCOUNTER — Emergency Department (HOSPITAL_COMMUNITY)
Admission: EM | Admit: 2010-07-13 | Discharge: 2010-07-13 | Payer: Self-pay | Source: Home / Self Care | Admitting: Emergency Medicine

## 2010-07-17 ENCOUNTER — Emergency Department (HOSPITAL_BASED_OUTPATIENT_CLINIC_OR_DEPARTMENT_OTHER)
Admission: EM | Admit: 2010-07-17 | Discharge: 2010-07-18 | Disposition: A | Discharge status: Home / Self Care | Payer: Self-pay | Attending: Emergency Medicine | Admitting: Emergency Medicine

## 2010-07-18 NOTE — Progress Notes (Signed)
Summary: meds refill- Imitrex  Phone Note Refill Request   Refills Requested: Medication #1:  IMITREX STATDOSE SYSTEM 6 MG/0.5ML KIT One injection at onset of headache [phone] [facility], patient need it today please   Initial call taken by: Domenic Polite,  June 21, 2010 12:00 PM  Follow-up for Phone Call        I sent a note to the pharmacy earlier today regarding this - verify that they received. I noted that they could give her what they had available until her ICP meds arrive Follow-up by: Lehman Prom FNP,  June 21, 2010 1:30 PM  Additional Follow-up for Phone Call Additional follow up Details #1::        Pharmacy states pt. picked up meds.  Dutch Quint RN  June 24, 2010 12:20 PM

## 2010-07-18 NOTE — Letter (Signed)
Summary: [facility]   Imported By: Arta Bruce 09/28/2009 10:11:10  _____________________________________________________________________  External Attachment:    Type:   Image     Comment:   External Document

## 2010-07-18 NOTE — Assessment & Plan Note (Signed)
Summary: Headache/Obesity   Vital Signs:  Patient profile:   40 year old female LMP:     08/2009 Weight:      178.5 pounds BMI:     30.75 BSA:     1.87 Temp:     98.0 degrees F oral Pulse rate:   62 / minute Pulse rhythm:   regular Resp:     20 per minute BP sitting:   121 / 84  (left arm) Cuff size:   regular  Vitals Entered By: Levon Hedger (September 06, 2009 3:43 PM) CC: trouble sleeping, weight gain, some chest discomfort x 2 weeks, Headache Is Patient Diabetic? No Pain Assessment Patient in pain? no       Does patient need assistance? Functional Status Self care Ambulation Normal LMP (date): 08/2009 LMP - Character: normal     Enter LMP: 08/2009 Last PAP Result normal   CC:  trouble sleeping, weight gain, some chest discomfort x 2 weeks, and Headache.  History of Present Illness:  Pt into the office with trouble sleeping. Long history of insomnia. Pt gets an average of 4 hours per sleep per night Sleep aids in the past have not been beneficial for pt. +snoring +Headache   Weight gain - Previous Hx of GERD but pt has been taking her meds and is doing better.   She has been gaining weight now for the past 6 months and is now at her highest weight. Admits that she get short of breath with everyday activities such as walking to the mailbox  Forgetfulness - Pt reports that she forgets things very quickly.   History of previous brain injury.  Social - Pt is employed in [place] and drives back and forth three times per week.   Headache HPI:      The headaches will last anywhere from 2 hours to 3 days at a time.  She has approximately 5+ headaches per month.  The patient is right handed.  There is no family history of migraine headaches.        On a scale of 1-10, she describes the headache as a 6.  The headaches are associated with an aura.  The location of the headaches are unilateral-left.  Headache quality is throbbing or pulsating.  The headaches are  associated with photophobia.        Additional history: Headache is under much better control since she has been taking the imitrex.     Habits & Providers  Alcohol-Tobacco-Diet     Alcohol drinks/day: <1     Tobacco Status: current     Tobacco Counseling: to quit use of tobacco products     Cigarette Packs/Day: 6-7 daily     Year Started: 1999  Exercise-Depression-Behavior     Does Patient Exercise: no     Have you felt down or hopeless? no     Have you felt little pleasure in things? no     Depression Counseling: not indicated; screening negative for depression     Drug Use: no     Seat Belt Use: 100     Sun Exposure: rarely  Allergies (verified): 1)  ! Vicodin  Review of Systems General:  Denies fever. CV:  Denies chest pain or discomfort. Resp:  Complains of shortness of breath. GI:  Denies abdominal pain, nausea, and vomiting. GU:  Complains of nocturia and urinary frequency. MS:  Denies joint pain.  Physical Exam  General:  alert.  obese Head:  normocephalic.   Eyes:  pupils round.   Lungs:  normal breath sounds.   Heart:  normal rate and regular rhythm.   Msk:  up to the exam table Neurologic:  alert & oriented X3.   Skin:  color normal.   Psych:  Oriented X3.     Impression & Recommendations:  Problem # 1:  INSOMNIA (ICD-780.52)  Her updated medication list for this problem includes:    Zolpidem Tartrate 10 Mg Tabs (Zolpidem tartrate) ..... One tablet nightly as needed for sleep  Orders: Split Night (Split Night)  Problem # 2:  OBESITY (ICD-278.00) advised pt to get sleep study Orders: Split Night (Split Night)  Problem # 3:  TOBACCO ABUSE (ICD-305.1) advised cessation  Problem # 4:  MIGRAINE HEADACHE (ICD-346.90) headache are much improved with imitrex pens The following medications were removed from the medication list:    Tylenol/codeine #3 300-30 Mg Tabs (Acetaminophen-codeine) .[name]... 1 tablet by mouth daily as needed for headache Her  updated medication list for this problem includes:    Maxalt 10 Mg Tabs (Rizatriptan benzoate) .[name]... 1 tablet by mouth daily at onset of headache then may repeat in 2 hours if needed    Imitrex Statdose System 6 Mg/0.74ml Kit (Sumatriptan succinate) ..... One injection at onset of headache  Orders: Split Night (Split Night)  Complete Medication List: 1)  Nexium 40 Mg Cpdr (Esomeprazole magnesium) .[name].. 1 tablet by mouth two times a day 2)  Neurontin 300 Mg Caps (Gabapentin) .[name].. 1 capsule by mouth nightly for headache 3)  Maxalt 10 Mg Tabs (Rizatriptan benzoate) .[name].. 1 tablet by mouth daily at onset of headache then may repeat in 2 hours if needed 4)  Carafate 1 Gm Tabs (Sucralfate) .[name].. 1 tablet by mouth 30 minutes before meals and at bedtime 5)  Zolpidem Tartrate 10 Mg Tabs (Zolpidem tartrate) .... One tablet nightly as needed for sleep 6)  Amitriptyline Hcl 50 Mg Tabs (Amitriptyline hcl) .... One tablet by mouth nightly for headache, increase to 2 tablets by mouth nightly in 1 week 7)  Imitrex Statdose System 6 Mg/0.28ml Kit (Sumatriptan succinate) .... One injection at onset of headache  Patient Instructions: 1)  Overactive bladder - decrease the stimulants such as caffiene and nicotine.  Stop drinking liquids at least 3 hours before bedtime. 2)  Sleep apnea - you will be scheduled for a sleep study.  You will be  notified of the time/date of the appointment.  It is important to know if this is contributing to your headache. 3)  Weight gain - it is important to eat 3 times per day but you should be eating small portions. 4)  Follow up as needed  Prevention & Chronic Care Immunizations   Influenza vaccine: Not documented    Tetanus booster: 12/16/2004: Historical    Pneumococcal vaccine: Not documented  Other Screening   Pap smear: normal  (04/15/2007)   Smoking status: current  (09/06/2009)   Smoking cessation counseling: yes  (03/23/2008)  Lipids   Total Cholesterol: 186   (12/08/2006)   LDL: 120  (12/08/2006)   LDL Direct: Not documented   HDL: 55  (12/08/2006)   Triglycerides: 53  (12/08/2006)   Laboratory Results   Urine Tests  Date/Time Received: September 06, 2009 4:49 PM   Routine Urinalysis   Color: lt. yellow Glucose: negative   (Normal Range: Negative) Bilirubin: negative   (Normal Range: Negative) Ketone: negative   (Normal Range: Negative) Spec. Gravity: 1.020   (Normal Range: 1.003-1.035) Blood: trace-lysed   (Normal  Range: Negative) pH: 5.5   (Normal Range: 5.0-8.0) Protein: negative   (Normal Range: Negative) Urobilinogen: 1.0   (Normal Range: 0-1) Nitrite: negative   (Normal Range: Negative) Leukocyte Esterace: negative   (Normal Range: Negative)

## 2010-07-18 NOTE — Progress Notes (Signed)
Summary: NEEDS HER IMITREX CALLED IN  Phone Note Call from Patient Call back at Home Phone [phone]   Reason for Call: Refill Medication Summary of Call: Jenny Evans. Jenny Evans CALLED TO SAY THAT SHE IS OUTOF HER SHOTS THAT SHE TAKE FOR HER HEADACHES AND SHE HAS BEEN GOING TO THE [facility] [address] FOR HER HEADACHES. . SHE CALLED [facility] AND WAS TOLD BY SOMEONE THERE TO HAVE Caroll Cunnington TO CALL IN THE MEDICINE IN  BECAUSE ITS TOO EARLY TO GET IT. Jenny Evans SAYS THAT SHE GOES BACK  TO WORK THIS FRIDAY AND SHE WANTS TO MAKE SURE SHE HAS THE MEDICINE BEFORE SHE GOES BACK. Initial call taken by: Leodis Rains,  October 09, 2009 12:07 PM  Follow-up for Phone Call        forward to N. Daphine Deutscher, FNP Follow-up by: Levon Hedger,  October 09, 2009 3:21 PM  Additional Follow-up for Phone Call Additional follow up Details #1::        Imitrex refill sent.  advise Evans that this medication is being sent from the drug company and they will only send a certain amount per given time.  If she is using more that that supply then there may be times when she is without injections Evans should maintain f/u at [facility] and be adhering to their advise which includes not taking BC/Goody powders Additional Follow-up by: Lehman Prom FNP,  October 09, 2009 3:44 PM    Additional Follow-up for Phone Call Additional follow up Details #2::    Evans informed of above information. Follow-up by: Levon Hedger,  October 11, 2009 11:53 AM  Prescriptions: Willette Brace STATDOSE SYSTEM 6 MG/0.5ML KIT (SUMATRIPTAN SUCCINATE) One injection at onset of headache  #6 x 3   Entered and Authorized by:   Lehman Prom FNP   Signed by:   Lehman Prom FNP on 10/09/2009   Method used:   Faxed to ...       [facility] - [facility] (retail)       [address] [place], [place]  [address]       Ph: [phone]       Fax: [phone]   RxID:   [id number]

## 2010-07-18 NOTE — Letter (Signed)
[facility]   Imported By: Arta Bruce 11/07/2009 11:27:32  _____________________________________________________________________  External Attachment:    Type:   Image     Comment:   External Document

## 2010-07-18 NOTE — Letter (Signed)
Summary: [facility]  [facility]   Imported By: Arta Bruce 09/12/2009 10:03:26  _____________________________________________________________________  External Attachment:    Type:   Image     Comment:   External Document

## 2010-07-18 NOTE — Progress Notes (Signed)
Summary: REFILL ON HEADACHE MED  Phone Note Call from Patient Call back at Home Phone [phone]   Reason for Call: Refill Medication Summary of Call: MARTIN PT. Dianey CALLED TO SEE IF YOU CAN REFILL HER IMITREX SHOTS EARLY, BECAUSE SHE HAS BEEN GETING HEADACHES MORE FREQUENTLY AND HAVING TO USE THE SHOTS MORE.  SHE USES [facility]. Initial call taken by: Leodis Rains,  Oct 25, 2009 8:59 AM  Follow-up for Phone Call        forward to N. Daphine Deutscher, fnp Follow-up by: Levon Hedger,  Oct 25, 2009 9:29 AM  Additional Follow-up for Phone Call Additional follow up Details #1::        advise pt that she needs to call neurology at [facility] and let them know she is having more frequent headaches and what see  they advise That's why she is going to them. I have filled imitrex with [facility] and as stated in previous phone note she is getting the maximum allowed per month so if she is requiring more that that she needs to let neurology know. Additional Follow-up by: Lehman Prom FNP,  Oct 25, 2009 9:46 AM    Additional Follow-up for Phone Call Additional follow up Details #2::    pt informed of above information. Follow-up by: Levon Hedger,  Oct 26, 2009 4:49 PM

## 2010-07-18 NOTE — Assessment & Plan Note (Signed)
Summary: Bronchitis   Vital Signs:  Patient profile:   40 year old female Height:      64 inches Weight:      166 pounds BMI:     28.60 Temp:     97.8 degrees F oral Pulse rate:   72 / minute Pulse rhythm:   regular Resp:     18 per minute BP sitting:   100 / 76  (left arm) Cuff size:   regular  Vitals Entered By: Arm[place] Shannon (March 25, 2010 12:29 PM) CC: pt is here for sore throat and congested... Is Patient Diabetic? No Pain Assessment Patient in pain? no       Does patient need assistance? Functional Status Self care Ambulation Normal   Primary Care Provider:  Lehman Prom FNP  CC:  pt is here for sore throat and congested....  History of Present Illness: Here for sore throat and cough.  Started 1 week ago.  + nasal congestion.  + cough . . . nonproductive.  Has gagging assoc with cough resulting in vomiting.  No other vomiting.  No diarrhea.  + headache last week . . .better now.  No ear pain.  Notes fever . . . 100.5.   Has been using robitussin without relief.  Has been using tylenol sinus for headache and nasal congestion.  OUt of work since last Monday.  Problems Prior to Update: 1)  Acute Bronchitis  (ICD-466.0) 2)  Obesity  (ICD-278.00) 3)  Insomnia  (ICD-780.52) 4)  Hypokalemia  (ICD-276.8) 5)  Dyspepsia&other Spec Disorders Function Stomach  (ICD-536.8) 6)  Allergic Rhinitis  (ICD-477.9) 7)  Constipation  (ICD-564.00) 8)  Shortness of Breath  (ICD-786.05) 9)  Health Maintenance Exam  (ICD-V70.0) 10)  Tobacco Abuse  (ICD-305.1) 11)  Abscess, Tooth  (ICD-522.5) 12)  Migraine Headache  (ICD-346.90) 13)  Reflux Gastritis  (ICD-535.40) 14)  Eczema  (ICD-692.9) 15)  Headache, Cluster  (ICD-346.20)  Current Medications (verified): 1)  Nexium 40 Mg Cpdr (Esomeprazole Magnesium) .[name].. 1 Tablet By Mouth Two Times A Day 2)  Neurontin 300 Mg Caps (Gabapentin) .[name].. 1 Capsule By Mouth Nightly For Headache 3)  Amitriptyline Hcl 50 Mg Tabs (Amitriptyline  Hcl) .... One Tablet By Mouth Nightly For Headache, Increase To 2 Tablets By Mouth Nightly in 1 Week 4)  Imitrex Statdose System 6 Mg/0.35ml Kit (Sumatriptan Succinate) .... One Injection At Onset of Headache 5)  Tylenol With Codeine #3 300-30 Mg Tabs (Acetaminophen-Codeine) .... One Tablet By Mouth Daily As Needed For Pain  Allergies (verified): 1)  ! Vicodin  Past History:  Past Medical History: Last updated: 02/25/2007 ECZEMA (ICD-692.9) HEADACHE, CLUSTER (ICD-346.20)  Physical Exam  General:  alert, well-developed, and well-nourished.   Head:  normocephalic and atraumatic.   Eyes:  pupils equal, pupils round, pupils reactive to light, and no injection.   Ears:  R ear normal and L ear normal.   Nose:  mucosal edema.   Mouth:  pharynx pink and moist, no erythema, and no exudates.   Neck:  no cervical lymphadenopathy.   Lungs:  normal breath sounds, no crackles, and no wheezes.   Heart:  normal rate and regular rhythm.   Abdomen:  soft, non-tender, and no hepatomegaly.   Extremities:  no edema  Neurologic:  alert & oriented X3 and cranial nerves II-XII intact.   Psych:  normally interactive.     Impression & Recommendations:  Problem # 1:  ACUTE BRONCHITIS (ICD-466.0)  prob viral does have significant cough . [name] [name]  will get CXR proventil as needed tessalon perles afrin x 3 days nasacort x 1-2 weeks fluids, rest work note for last week and this week f/u in 1 week  Her updated medication list for this problem includes:    Proventil Hfa 108 (90 Base) Mcg/act Aers (Albuterol sulfate) .[name]... 1-2 puffs every 4-6 hours as needed    Tessalon Perles 100 Mg Caps (Benzonatate) .[name]... Take 1 capsule by mouth three times a day as needed for cough  Orders: CXR- 2view (CXR)  Complete Medication List: 1)  Nexium 40 Mg Cpdr (Esomeprazole magnesium) .[name].. 1 tablet by mouth two times a day 2)  Neurontin 300 Mg Caps (Gabapentin) .[name].. 1 capsule by mouth nightly for headache 3)   Amitriptyline Hcl 50 Mg Tabs (Amitriptyline hcl) .... One tablet by mouth nightly for headache, increase to 2 tablets by mouth nightly in 1 week 4)  Imitrex Statdose System 6 Mg/0.18ml Kit (Sumatriptan succinate) .... One injection at onset of headache 5)  Tylenol With Codeine #3 300-30 Mg Tabs (Acetaminophen-codeine) .... One tablet by mouth daily as needed for pain 6)  Proventil Hfa 108 (90 Base) Mcg/act Aers (Albuterol sulfate) .[name].. 1-2 puffs every 4-6 hours as needed 7)  Tessalon Perles 100 Mg Caps (Benzonatate) .... Take 1 capsule by mouth three times a day as needed for cough 8)  Nasacort Aq 55 Mcg/act Aers (Triamcinolone acetonide) .... 2 sprays each nostril once daily  Patient Instructions: 1)  I have faxed the following medicines to the [facility]. pharmacy: 2)  Proventil - inhaler to use for cough; Use it around the clock the first 3-4 days, then as needed. 3)  Nasacort - for nasal congestion; use for 1-2 weeks 4)  Tessalon Perles - for cough; use three times a day as needed. 5)  You can continue the Tylenol sinus as needed. 6)  You can also use Robitussin as directed as needed or get Mucinex DM and take one tablet two times a day for one week. 7)  Drink plenty of fluids.  Try to stick to clear liquids for one day so you stop vomiting with coughing. 8)  Get Afrin and use for 3 days only.  Stop after 3 days. 9)  Get plenty of rest.  10)  Schedule follow up with [facility] (or [facility] if nothing available) in one week for cough.  Return sooner if worse. 11)  Get the xray done. Prescriptions: NASACORT AQ 55 MCG/ACT AERS (TRIAMCINOLONE ACETONIDE) 2 sprays each nostril once daily  #1 x 0   Entered and Authorized by:   Tereso Newcomer PA-C   Signed by:   Tereso Newcomer PA-C on 03/25/2010   Method used:   Faxed to ...       [facility] - [facility] (retail)       [address] [place], [place]  [address]       Ph: [phone]       Fax: [phone]   RxID:    [id number] TESSALON PERLES 100 MG CAPS (BENZONATATE) Take 1 capsule by mouth three times a day as needed for cough  #30 x 0   Entered and Authorized by:   Tereso Newcomer PA-C   Signed by:   Tereso Newcomer PA-C on 03/25/2010   Method used:   Faxed to ...       [facility] - [facility] (retail)       [address]  [place], [place]  [address]       Ph: [phone]       Fax: [phone]   RxID:   [id number] PROVENTIL HFA 108 (90 BASE) MCG/ACT AERS (ALBUTEROL SULFATE) 1-2 puffs every 4-6 hours as needed  #1 x 0   Entered and Authorized by:   Tereso Newcomer PA-C   Signed by:   Tereso Newcomer PA-C on 03/25/2010   Method used:   Faxed to ...       [facility] - [facility] (retail)       [address] [place], [place]  [address]       Ph: [phone]       Fax: [phone]   RxID:   [id number]   Laboratory Results    Other Tests  Rapid Strep: negative

## 2010-07-18 NOTE — Letter (Signed)
Summary: *HSN Results Follow up  [facility]  [address] [place], [place] [address]   Phone: [phone]  Fax: [phone]      09/19/2009   Jenny Evans [address] [place], [place]  [address]   Dear  Ms. Jenny Evans,                            ____S.Drinkard,FNP   ____D. Gore,FNP       ____B. McPherson,MD   ____V. Rankins,MD    ____E. Mulberry,MD    ____N. Daphine Deutscher, FNP  ____D. Reche Dixon, MD    ____K. Philipp Deputy, MD    ____Other     This letter is to inform you that your recent test(s):  _______Pap Smear    _______Lab Test     _______X-ray    _______ is within acceptable limits  _______ requires a medication change  _______ requires a follow-up lab visit  _______ requires a follow-up visit with your provider   Comments: [facility] has been trying to contact you to schedule an appointment.  Please contact them at [phone].  If you have any questions please contact the office.       _________________________________________________________ If you have any questions, please contact our office                     Sincerely,  Levon Hedger [facility]

## 2010-07-18 NOTE — Assessment & Plan Note (Signed)
Summary: Headaches   Vital Signs:  Patient profile:   40 year old female LMP:     01/2010 Weight:      172.2 pounds Pulse rate:   67 / minute Pulse rhythm:   regular Resp:     20 per minute BP sitting:   100 / 70  (left arm) Cuff size:   regular  Vitals Entered By: Levon Hedger (March 01, 2010 10:19 AM) CC: headaches...not sleeping, Abdominal Pain, Headache Is Patient Diabetic? No Pain Assessment Patient in pain? no       Does patient need assistance? Functional Status Self care Ambulation Normal LMP (date): 01/2010 LMP - Character: normal     Enter LMP: 01/2010 Last PAP Result normal   CC:  headaches...not sleeping, Abdominal Pain, and Headache.  History of Present Illness:  Pt the office for headache and sleep. Pt is having ongoing problems with sleeping.  She was scheduled sleep study back in May 2011 but they were never able to contact her directly regarding scheduled.  Pt is still interested in having this test done and will get it rescheduled.  Dyspepsia History:      She has no alarm features of dyspepsia including no history of melena, hematochezia, dysphagia, persistent vomiting, or involuntary weight loss > 5%.  There is a prior history of GERD.  The patient does not have a prior history of documented ulcer disease.  The dominant symptom is heartburn or acid reflux.  An H-2 blocker medication is not currently being taken.  She has no history of a positive H. Pylori serology.  A prior EGD has been done which showed no evidence for moderate or severe esophagitis or Barrett's esophagus.    Headache HPI:      The headaches will last anywhere from 2 hours to 3 days at a time.  She has approximately 5+ headaches per month.  The patient is right handed.  There is no family history of migraine headaches.        On a scale of 1-10, she describes the headache as a 6.  The headaches are associated with an aura.  The location of the headaches are unilateral-left.   Headache quality is throbbing or pulsating.  The headaches are associated with photophobia.          Habits & Providers  Alcohol-Tobacco-Diet     Alcohol drinks/day: <1     Tobacco Status: current     Tobacco Counseling: to quit use of tobacco products     Cigarette Packs/Day: 6-7 daily     Year Started: 1999  Exercise-Depression-Behavior     Does Patient Exercise: no     Depression Counseling: not indicated; screening negative for depression     Drug Use: no     Seat Belt Use: 100     Sun Exposure: rarely  Allergies (verified): 1)  ! Vicodin  Review of Systems General:  Complains of sleep disorder. CV:  Denies chest pain or discomfort. Resp:  Denies cough. GI:  Complains of indigestion; denies abdominal pain, nausea, and vomiting. Neuro:  Complains of headaches; denies seizures.  Physical Exam  General:  alert.   Head:  normocephalic.   Ears:  ear piercing(s) noted.   Lungs:  normal breath sounds.   Heart:  normal rate and regular rhythm.     Impression & Recommendations:  Problem # 1:  INSOMNIA (ICD-780.52) Pt will need get the sleep study The following medications were removed from the medication list:  Zolpidem Tartrate 10 Mg Tabs (Zolpidem tartrate) ..... One tablet nightly as needed for sleep  Problem # 2:  MIGRAINE HEADACHE (ICD-346.90) advised pt to take tylenol #3 with "small" headaches The following medications were removed from the medication list:    Maxalt 10 Mg Tabs (Rizatriptan benzoate) .[name]... 1 tablet by mouth daily at onset of headache then may repeat in 2 hours if needed Her updated medication list for this problem includes:    Imitrex Statdose System 6 Mg/0.84ml Kit (Sumatriptan succinate) ..... One injection at onset of headache    Tylenol With Codeine #3 300-30 Mg Tabs (Acetaminophen-codeine) ..... One tablet by mouth daily as needed for pain  Problem # 3:  REFLUX GASTRITIS (ICD-535.40) Advised pt that she need to take her medications The  following medications were removed from the medication list:    Carafate 1 Gm Tabs (Sucralfate) .[name]... 1 tablet by mouth 30 minutes before meals and at bedtime Her updated medication list for this problem includes:    Nexium 40 Mg Cpdr (Esomeprazole magnesium) .[name]... 1 tablet by mouth two times a day  Complete Medication List: 1)  Nexium 40 Mg Cpdr (Esomeprazole magnesium) .[name].. 1 tablet by mouth two times a day 2)  Neurontin 300 Mg Caps (Gabapentin) .[name].. 1 capsule by mouth nightly for headache 3)  Amitriptyline Hcl 50 Mg Tabs (Amitriptyline hcl) .... One tablet by mouth nightly for headache, increase to 2 tablets by mouth nightly in 1 week 4)  Imitrex Statdose System 6 Mg/0.8ml Kit (Sumatriptan succinate) .... One injection at onset of headache 5)  Tylenol With Codeine #3 300-30 Mg Tabs (Acetaminophen-codeine) .... One tablet by mouth daily as needed for pain  Patient Instructions: 1)  You will need your sleep study .  Keep your appointment. 2)  This will help to tell is this is the reason why you are not sleeping 3)  Take Tylenol #3 for mild headache 4)  Schedule an appointment for a complete physical exam 5)  Do not eat after midnight before this appointment Prescriptions: TYLENOL WITH CODEINE #3 300-30 MG TABS (ACETAMINOPHEN-CODEINE) One tablet by mouth daily as needed for pain  #30 x 0   Entered and Authorized by:   Lehman Prom FNP   Signed by:   Lehman Prom FNP on 03/01/2010   Method used:   Print then Give to Patient   RxID:   [id number]

## 2010-07-22 ENCOUNTER — Emergency Department (HOSPITAL_COMMUNITY)
Admission: EM | Admit: 2010-07-22 | Discharge: 2010-07-22 | Discharge status: Against Medical Advice | Payer: Self-pay | Attending: Emergency Medicine | Admitting: Emergency Medicine

## 2010-07-22 DIAGNOSIS — R51 Headache: Secondary | ICD-10-CM | POA: Insufficient documentation

## 2010-07-24 ENCOUNTER — Emergency Department (HOSPITAL_COMMUNITY)
Admission: EM | Admit: 2010-07-24 | Discharge: 2010-07-24 | Disposition: A | Discharge status: Home / Self Care | Payer: Self-pay | Attending: Emergency Medicine | Admitting: Emergency Medicine

## 2010-07-24 DIAGNOSIS — G43909 Migraine, unspecified, not intractable, without status migrainosus: Secondary | ICD-10-CM | POA: Insufficient documentation

## 2010-09-04 LAB — URINALYSIS, ROUTINE W REFLEX MICROSCOPIC
Bilirubin Urine: NEGATIVE
Glucose, UA: NEGATIVE mg/dL
Hgb urine dipstick: NEGATIVE
Ketones, ur: NEGATIVE mg/dL
Nitrite: NEGATIVE
Protein, ur: NEGATIVE mg/dL
Specific Gravity, Urine: 1.015 (ref 1.005–1.030)
Urobilinogen, UA: 2 mg/dL — ABNORMAL HIGH (ref 0.0–1.0)
pH: 7 (ref 5.0–8.0)

## 2010-09-04 LAB — PREGNANCY, URINE: Preg Test, Ur: NEGATIVE

## 2010-09-19 LAB — CBC
HCT: 31.8 % — ABNORMAL LOW (ref 36.0–46.0)
MCHC: 35.6 g/dL (ref 30.0–36.0)
Platelets: 477 10*3/uL — ABNORMAL HIGH (ref 150–400)
RDW: 16.6 % — ABNORMAL HIGH (ref 11.5–15.5)

## 2010-09-19 LAB — DIFFERENTIAL
Basophils Absolute: 0.2 10*3/uL — ABNORMAL HIGH (ref 0.0–0.1)
Basophils Relative: 2 % — ABNORMAL HIGH (ref 0–1)
Eosinophils Relative: 2 % (ref 0–5)
Lymphocytes Relative: 40 % (ref 12–46)
Monocytes Absolute: 0.7 10*3/uL (ref 0.1–1.0)
Neutro Abs: 5 10*3/uL (ref 1.7–7.7)

## 2010-09-19 LAB — BASIC METABOLIC PANEL
BUN: 12 mg/dL (ref 6–23)
CO2: 28 mEq/L (ref 19–32)
Calcium: 9.1 mg/dL (ref 8.4–10.5)
GFR calc non Af Amer: >60 mL/min (ref >60)
Glucose, Bld: 119 mg/dL — ABNORMAL HIGH (ref 70–99)

## 2010-09-19 LAB — POCT CARDIAC MARKERS: Troponin i, poc: <0.05 ng/mL (ref 0.00–0.09)

## 2010-09-21 ENCOUNTER — Emergency Department (HOSPITAL_BASED_OUTPATIENT_CLINIC_OR_DEPARTMENT_OTHER)
Admission: EM | Admit: 2010-09-21 | Discharge: 2010-09-22 | Disposition: A | Discharge status: Home / Self Care | Payer: Self-pay | Attending: Emergency Medicine | Admitting: Emergency Medicine

## 2010-09-21 DIAGNOSIS — X58XXXA Exposure to other specified factors, initial encounter: Secondary | ICD-10-CM | POA: Insufficient documentation

## 2010-09-21 DIAGNOSIS — S40029A Contusion of unspecified upper arm, initial encounter: Secondary | ICD-10-CM | POA: Insufficient documentation

## 2010-09-21 DIAGNOSIS — F17200 Nicotine dependence, unspecified, uncomplicated: Secondary | ICD-10-CM | POA: Insufficient documentation

## 2010-09-22 LAB — URINALYSIS, ROUTINE W REFLEX MICROSCOPIC
Bilirubin Urine: NEGATIVE
Glucose, UA: NEGATIVE mg/dL
Specific Gravity, Urine: 1.016 (ref 1.005–1.030)
Urobilinogen, UA: 1 mg/dL (ref 0.0–1.0)

## 2010-09-22 LAB — URINE MICROSCOPIC-ADD ON

## 2010-09-24 LAB — URINALYSIS, ROUTINE W REFLEX MICROSCOPIC
Glucose, UA: NEGATIVE mg/dL
Hgb urine dipstick: NEGATIVE
Ketones, ur: NEGATIVE mg/dL
pH: 7 (ref 5.0–8.0)

## 2010-09-24 LAB — CBC
Hemoglobin: 11.3 g/dL — ABNORMAL LOW (ref 12.0–15.0)
MCHC: 34.2 g/dL (ref 30.0–36.0)
Platelets: 401 10*3/uL — ABNORMAL HIGH (ref 150–400)
RDW: 17.9 % — ABNORMAL HIGH (ref 11.5–15.5)

## 2010-09-24 LAB — DIFFERENTIAL
Basophils Relative: 1 % (ref 0–1)
Eosinophils Absolute: 0.1 10*3/uL (ref 0.0–0.7)
Lymphs Abs: 2.6 10*3/uL (ref 0.7–4.0)
Monocytes Absolute: 0.5 10*3/uL (ref 0.1–1.0)
Monocytes Relative: 9 % (ref 3–12)
Neutrophils Relative %: 45 % (ref 43–77)

## 2010-09-24 LAB — COMPREHENSIVE METABOLIC PANEL
ALT: 20 U/L (ref 0–35)
AST: 23 U/L (ref 0–37)
Albumin: 3.6 g/dL (ref 3.5–5.2)
Alkaline Phosphatase: 65 U/L (ref 39–117)
Calcium: 8.9 mg/dL (ref 8.4–10.5)
GFR calc Af Amer: >60 mL/min (ref >60)
Glucose, Bld: 106 mg/dL — ABNORMAL HIGH (ref 70–99)
Potassium: 3.1 mEq/L — ABNORMAL LOW (ref 3.5–5.1)
Sodium: 138 mEq/L (ref 135–145)
Total Protein: 6.5 g/dL (ref 6.0–8.3)

## 2010-09-24 LAB — POCT I-STAT, CHEM 8
BUN: 3 mg/dL — ABNORMAL LOW (ref 6–23)
Chloride: 106 mEq/L (ref 96–112)
HCT: 37 % (ref 36.0–46.0)
Potassium: 3.5 mEq/L (ref 3.5–5.1)

## 2010-09-24 LAB — POCT PREGNANCY, URINE: Preg Test, Ur: NEGATIVE

## 2010-09-25 LAB — URINALYSIS, ROUTINE W REFLEX MICROSCOPIC
Leukocytes, UA: NEGATIVE
Nitrite: NEGATIVE
Specific Gravity, Urine: 1.014 (ref 1.005–1.030)
Urobilinogen, UA: 1 mg/dL (ref 0.0–1.0)
pH: 6 (ref 5.0–8.0)

## 2010-09-25 LAB — URINE MICROSCOPIC-ADD ON

## 2010-09-25 LAB — PREGNANCY, URINE: Preg Test, Ur: NEGATIVE

## 2010-10-13 ENCOUNTER — Emergency Department (HOSPITAL_BASED_OUTPATIENT_CLINIC_OR_DEPARTMENT_OTHER)
Admission: EM | Admit: 2010-10-13 | Discharge: 2010-10-13 | Disposition: A | Discharge status: Home / Self Care | Payer: Self-pay | Attending: Emergency Medicine | Admitting: Emergency Medicine

## 2010-10-13 ENCOUNTER — Emergency Department (INDEPENDENT_AMBULATORY_CARE_PROVIDER_SITE_OTHER): Payer: Self-pay

## 2010-10-13 DIAGNOSIS — R319 Hematuria, unspecified: Secondary | ICD-10-CM

## 2010-10-13 DIAGNOSIS — R109 Unspecified abdominal pain: Secondary | ICD-10-CM | POA: Insufficient documentation

## 2010-10-13 DIAGNOSIS — Z79899 Other long term (current) drug therapy: Secondary | ICD-10-CM | POA: Insufficient documentation

## 2010-10-13 DIAGNOSIS — F17200 Nicotine dependence, unspecified, uncomplicated: Secondary | ICD-10-CM | POA: Insufficient documentation

## 2010-10-13 LAB — URINALYSIS, ROUTINE W REFLEX MICROSCOPIC
Bilirubin Urine: NEGATIVE
Glucose, UA: NEGATIVE mg/dL
Ketones, ur: NEGATIVE mg/dL
Leukocytes, UA: NEGATIVE
pH: 6 (ref 5.0–8.0)

## 2010-10-13 LAB — URINE MICROSCOPIC-ADD ON

## 2010-10-16 ENCOUNTER — Emergency Department (HOSPITAL_COMMUNITY)
Admission: EM | Admit: 2010-10-16 | Discharge: 2010-10-16 | Discharge status: Against Medical Advice | Payer: Self-pay | Attending: Emergency Medicine | Admitting: Emergency Medicine

## 2010-10-16 DIAGNOSIS — R109 Unspecified abdominal pain: Secondary | ICD-10-CM | POA: Insufficient documentation

## 2010-10-16 DIAGNOSIS — R51 Headache: Secondary | ICD-10-CM | POA: Insufficient documentation

## 2010-10-17 ENCOUNTER — Emergency Department (HOSPITAL_COMMUNITY)
Admission: EM | Admit: 2010-10-17 | Discharge: 2010-10-17 | Disposition: A | Discharge status: Home / Self Care | Payer: Self-pay | Attending: Emergency Medicine | Admitting: Emergency Medicine

## 2010-10-17 DIAGNOSIS — R319 Hematuria, unspecified: Secondary | ICD-10-CM | POA: Insufficient documentation

## 2010-10-17 DIAGNOSIS — R109 Unspecified abdominal pain: Secondary | ICD-10-CM | POA: Insufficient documentation

## 2010-10-17 LAB — URINALYSIS, ROUTINE W REFLEX MICROSCOPIC
Bilirubin Urine: NEGATIVE
Nitrite: NEGATIVE
Specific Gravity, Urine: 1.012 (ref 1.005–1.030)
Urobilinogen, UA: 1 mg/dL (ref 0.0–1.0)

## 2010-10-17 LAB — URINE MICROSCOPIC-ADD ON

## 2010-10-17 LAB — POCT I-STAT, CHEM 8
Glucose, Bld: 88 mg/dL (ref 70–99)
HCT: 36 % (ref 36.0–46.0)
Hemoglobin: 12.2 g/dL (ref 12.0–15.0)
Potassium: 3.6 mEq/L (ref 3.5–5.1)
Sodium: 142 mEq/L (ref 135–145)

## 2010-10-17 LAB — POCT PREGNANCY, URINE: Preg Test, Ur: NEGATIVE

## 2010-10-29 ENCOUNTER — Emergency Department (HOSPITAL_COMMUNITY): Payer: Self-pay

## 2010-10-29 ENCOUNTER — Emergency Department (HOSPITAL_COMMUNITY)
Admission: EM | Admit: 2010-10-29 | Discharge: 2010-10-29 | Disposition: A | Discharge status: Home / Self Care | Payer: Self-pay | Attending: Emergency Medicine | Admitting: Emergency Medicine

## 2010-10-29 DIAGNOSIS — K5900 Constipation, unspecified: Secondary | ICD-10-CM | POA: Insufficient documentation

## 2010-10-29 DIAGNOSIS — R109 Unspecified abdominal pain: Secondary | ICD-10-CM | POA: Insufficient documentation

## 2010-11-01 NOTE — Discharge Summary (Signed)
NAMEKELISHA, Jenny Evans                          ACCOUNT NO.:  [id number]   MEDICAL RECORD NO.:  [id number]                   PATIENT TYPE:  IPS   LOCATION:  0508                                 [facility]ACILITY[facility]  [facility]   PHYSICIAN:  Carolanne Grumbling, M.D.                 DATE OF BIRTH:  October 10, 1970   DATE OF ADMISSION:  01/11/2004  DATE OF DISCHARGE:  01/12/2004                                 DISCHARGE SUMMARY   Jenny Evans was a 40 year old female.   INITIAL ASSESSMENT AND DIAGNOSIS:  Jenny Evans was admitted after a  reportedly intentional overdose, taking 22 Flexeril tablets. When she was  medically cleared and sent here, she was very angry, saying that she did not  know that she was signing herself into this hospital from the other  hospital, that she was drowsy and half asleep when she was asked to sign the  papers, and she thought she was just signing her discharge papers. She said  she took the pills to relief some pain. She did have some alcohol on top of  it while playing cards with her cousin. She never intended to harm herself,  and if she did take further pills, she had no awareness of doing it. She  said she was not suicidal prior, during the time, or currently.   Mental status at the time of the initial evaluation revealed an alert,  oriented woman who refused to speak. She was tearful. She was angry,  resistant to admission. There was no evidence of any psychosis. Judgment was  fair. Insight was poor at that time.   ADMISSION DIAGNOSES:   AXIS I:  Depressive disorder, not otherwise specified.   AXIS II:  Deferred.   AXIS III:  Sickle cell anemia.   AXIS IV:  Deferred.   AXIS V:  35/unknown.   FINDINGS:  All indicated laboratory examinations were performed prior to  admission.   HOSPITAL COURSE:  While in the hospital, Jenny Evans by later the same day  of admission and by the next day when I saw her was very clear, alert,  pleasant, apologetic for her behavior the  day before. She said she did have  chronic back pain that she was relieving with Flexeril. She took several  more than she needed to, trying to help with the pain, and then she drank on  top of. She knew she should have, but she did not think it would cause a  problem. At some point, she said she have taken more, but she has no recall  of that. She knows she did not take all of the Flexeril that she was  supposed to have taken. She said she was never suicidal anytime during this  episode. She has lots to live for. She has good family. She has good  friends. She feels good about herself in spite of some problems she has in  her  life. She was very upset when she realized she had signed herself in  her. She said she thought she was signing papers for her discharge from the  other hospital and did not grasp that she was agreeing to come to a  psychiatric hospital. Nevertheless, she said at this point she would be  cooperative with whatever it took to get out of the hospital. Because she  was denying any threats towards herself or anyone else and her story was  plausible, I talked to her cousin that is close to her and she sees  practically every day. He confirmed that she was there and she was drinking  some and that she was not suicidal in his opinion, and he never had any  concerns about her one way or the other when it came to taking her life; it  just was never an issue; and he also concurred that she did not need to be  in the hospital. Consequently, she was discharged home.   FINAL DIAGNOSES:   AXIS I:  Adjustment disorder with mixed emotional disturbance.   AXIS II:  No diagnosis.   AXIS III:  Sickle cell anemia.   AXIS IV:  Moderate.   AXIS V:  60/65.   POST-HOSPITAL CARE PLANS:  She was taking no psychiatric medications at the  time of discharge. She was referred to the [facility] with  an appointment with Dr. _____________ on August 1 if she chose to keep  it.  There were no changes in her pain medication. There were no restrictions  placed on her activity or her diet.                                               Carolanne Grumbling, M.D.    GT/MEDQ  D:  01/25/2004  T:  01/26/2004  Job:  [id number]

## 2010-11-01 NOTE — H&P (Signed)
NAMESHAYLINN, HLADIK                          ACCOUNT NO.:  [id number]   MEDICAL RECORD NO.:  [id number]                   PATIENT TYPE:  IPS   LOCATION:  0508                                 FACILITY:  [facility]   PHYSICIAN:  Jeanice Lim, M.D.              DATE OF BIRTH:  1971-05-15   DATE OF ADMISSION:  01/11/2004  DATE OF DISCHARGE:                         PSYCHIATRIC ADMISSION ASSESSMENT   IDENTIFYING INFORMATION:  A 40 year old single African-American female  voluntarily admitted on January 11, 2004.   HISTORY OF PRESENT ILLNESS:  The patient presents with a history of  intentional overdose, overdosing on 22 Flexeril tablets.  The patient  initially was seen in the assessment area.  At this time, the patient did  not want to arrive on the unit.  She is very angry, states that she was half  asleep and was asked to volunteer.  She states that she had taken the pills  only to release some pain, did have a drink, was playing cards with her  cousin at the time, there was never an intention to harm herself.  She  states that if she is admitted she will not either talk or attend groups if  she has to be a patient.   PAST PSYCHIATRIC HISTORY:  First admission to [facility].  Past psychiatric history is relatively unknown as the patient is not  volunteering any information.   SOCIAL HISTORY:  She is a 40 year old single African-American female, living  her grandmother.  Marital status and children are unknown at this time.   FAMILY HISTORY:  Unknown.   ALCOHOL DRUG HISTORY:  Unknown.   PAST MEDICAL HISTORY:  Primary care Dianelly Ferran is not known at this time.  Medical problems are sickle cell anemia.   MEDICATIONS:  Unknown.   DRUG ALLERGIES:  No known allergies.   PHYSICAL EXAMINATION:  The patient was assessed at [facility] where the  patient was charcoaled.  Her vital signs are stable, 97.1, 88 heart rate, 16  respirations, blood pressure is 122/82.  She is 125  pounds, 5 feet 4-1/2  inches tall.  She is a well-nourished female in no acute distress with a  normal EKG.  Potassium 3.3, urine drug screen is positive for barbiturates.  Alcohol level 8.  Acetaminophen level less than 8.  RBC is 3.8, hematocrit  32.8.  Salicylate level less than 4.   MENTAL STATUS EXAM:  Alert young female, little eye contact.  Will not speak  at this time, very tearful.  Her speech is clear.  She is angry and somewhat  resistant to being admitted, tearful at times.  Thought process:  The  patient is not found to be actively responding to internal stimuli.  Cognitive function:  The patient is aware of self and situation.  Judgment  is fair.  Insight at this time is poor.   ADMISSION DIAGNOSES:   AXIS I:  Depressive disorder  not otherwise specified.[name]   AXIS II:  Deferred.   AXIS III:  Sickle cell anemia.   AXIS IV:  Deferred.[name]   AXIS V:  Current is 35, this past year is unknown.   PLAN:  Admission for intentional overdose.  Contract for safety, stabilize  mood and thinking. We will allow the patient to integrate to unit  activities, encourage the patient to attend groups.  Will contact family for  collateral information.  The patient is to increase coping skills.   TENTATIVE LENGTH OF CARE:  3-4 days.     Landry Corporal, N.P.                       Jeanice Lim, M.D.    JO/MEDQ  D:  01/12/2004  T:  01/12/2004  Job:  [id number]

## 2011-03-13 LAB — DIFFERENTIAL
Basophils Absolute: 0
Basophils Relative: 0
Eosinophils Relative: 0
Lymphs Abs: 1.2
Monocytes Relative: 3

## 2011-03-13 LAB — CBC
MCHC: 34.1
MCV: 81.9
Platelets: 532 — ABNORMAL HIGH
RDW: 18.9 — ABNORMAL HIGH
WBC: 9.2

## 2011-03-13 LAB — POCT I-STAT, CHEM 8
BUN: 6
Calcium, Ion: 1.07 — ABNORMAL LOW
Creatinine, Ser: 0.8
TCO2: 27

## 2011-03-13 LAB — RAPID STREP SCREEN (MED CTR MEBANE ONLY): Streptococcus, Group A Screen (Direct): NEGATIVE

## 2011-03-26 ENCOUNTER — Emergency Department (HOSPITAL_BASED_OUTPATIENT_CLINIC_OR_DEPARTMENT_OTHER)
Admission: EM | Admit: 2011-03-26 | Discharge: 2011-03-26 | Disposition: A | Discharge status: Home / Self Care | Payer: Self-pay | Attending: Emergency Medicine | Admitting: Emergency Medicine

## 2011-03-26 ENCOUNTER — Emergency Department (INDEPENDENT_AMBULATORY_CARE_PROVIDER_SITE_OTHER): Payer: Self-pay

## 2011-03-26 DIAGNOSIS — R109 Unspecified abdominal pain: Secondary | ICD-10-CM | POA: Insufficient documentation

## 2011-03-26 DIAGNOSIS — K219 Gastro-esophageal reflux disease without esophagitis: Secondary | ICD-10-CM | POA: Insufficient documentation

## 2011-03-26 DIAGNOSIS — E876 Hypokalemia: Secondary | ICD-10-CM | POA: Insufficient documentation

## 2011-03-26 DIAGNOSIS — R1013 Epigastric pain: Secondary | ICD-10-CM

## 2011-03-26 HISTORY — DX: Gastro-esophageal reflux disease without esophagitis: K21.9

## 2011-03-26 LAB — COMPREHENSIVE METABOLIC PANEL
ALT: 13 U/L (ref 0–35)
Alkaline Phosphatase: 88 U/L (ref 39–117)
BUN: 10 mg/dL (ref 6–23)
CO2: 24 mEq/L (ref 19–32)
Chloride: 106 mEq/L (ref 96–112)
GFR calc Af Amer: >90 mL/min (ref >90)
Glucose, Bld: 86 mg/dL (ref 70–99)
Potassium: 3 mEq/L — ABNORMAL LOW (ref 3.5–5.1)
Sodium: 139 mEq/L (ref 135–145)
Total Bilirubin: 0.2 mg/dL — ABNORMAL LOW (ref 0.3–1.2)
Total Protein: 6.9 g/dL (ref 6.0–8.3)

## 2011-03-26 LAB — URINE MICROSCOPIC-ADD ON

## 2011-03-26 LAB — URINALYSIS, ROUTINE W REFLEX MICROSCOPIC
Bilirubin Urine: NEGATIVE
Ketones, ur: NEGATIVE mg/dL
Specific Gravity, Urine: 1.015 (ref 1.005–1.030)
Urobilinogen, UA: 1 mg/dL (ref 0.0–1.0)

## 2011-03-26 LAB — DIFFERENTIAL
Eosinophils Absolute: 0.1 10*3/uL (ref 0.0–0.7)
Lymphocytes Relative: 45 % (ref 12–46)
Lymphs Abs: 3.2 10*3/uL (ref 0.7–4.0)
Monocytes Relative: 7 % (ref 3–12)
Neutro Abs: 3.3 10*3/uL (ref 1.7–7.7)
Neutrophils Relative %: 46 % (ref 43–77)

## 2011-03-26 LAB — CBC
Hemoglobin: 10.9 g/dL — ABNORMAL LOW (ref 12.0–15.0)
Platelets: 386 10*3/uL (ref 150–400)
RBC: 3.65 MIL/uL — ABNORMAL LOW (ref 3.87–5.11)
WBC: 7 10*3/uL (ref 4.0–10.5)

## 2011-03-26 MED ORDER — MORPHINE SULFATE 4 MG/ML IJ SOLN
4.0000 mg | Freq: Once | INTRAMUSCULAR | Status: AC
  Administered 2011-03-26: 4 mg via INTRAVENOUS
  Filled 2011-03-26: qty 1

## 2011-03-26 MED ORDER — POTASSIUM CHLORIDE CRYS ER 20 MEQ PO TBCR
40.0000 meq | EXTENDED_RELEASE_TABLET | Freq: Once | ORAL | Status: AC
  Administered 2011-03-26: 40 meq via ORAL
  Filled 2011-03-26: qty 1

## 2011-03-26 MED ORDER — ONDANSETRON HCL 4 MG/2ML IJ SOLN
4.0000 mg | Freq: Once | INTRAMUSCULAR | Status: AC
  Administered 2011-03-26: 4 mg via INTRAVENOUS
  Filled 2011-03-26: qty 2

## 2011-03-26 MED ORDER — OXYCODONE-ACETAMINOPHEN 5-325 MG PO TABS: 1.0000 | ORAL_TABLET | ORAL | Status: AC | PRN

## 2011-03-26 NOTE — ED Provider Notes (Signed)
Medical screening examination/treatment/procedure(s) were performed by non-physician practitioner and as supervising physician I was immediately available for consultation/collaboration.   Lyanne Co, MD 03/26/11 Paulo Fruit

## 2011-03-26 NOTE — ED Provider Notes (Signed)
History     CSN: [id number] Arrival date & time: 03/26/2011  4:18 PM  Chief Complaint  Patient presents with  . Abdominal Pain    (Consider location/radiation/quality/duration/timing/severity/associated sxs/prior treatment) HPI Comments: Pt states that she was told in the past that it could be related to an ulcer:pt denies n/v/d or bloody stools  Patient is a 40 y.o. female presenting with abdominal pain. The history is provided by the patient. No language interpreter was used.  Abdominal Pain The primary symptoms of the illness include abdominal pain and nausea. The primary symptoms of the illness do not include fever, vomiting, diarrhea, dysuria, vaginal discharge or vaginal bleeding. The current episode started more than 2 days ago. The onset of the illness was sudden. The problem has not changed since onset. The patient states that she believes she is currently not pregnant. The patient has not had a change in bowel habit. Symptoms associated with the illness do not include urgency, frequency or back pain.    Past Medical History  Diagnosis Date  . Migraine   . GERD (gastroesophageal reflux disease)     Past Surgical History  Procedure Date  . Myringotomy     No family history on file.  History  Substance Use Topics  . Smoking status: Current Everyday Smoker  . Smokeless tobacco: Not on file  . Alcohol Use: No    OB History    Grav Para Term Preterm Abortions TAB SAB Ect Mult Living                  Review of Systems  Constitutional: Negative for fever.  Gastrointestinal: Positive for nausea and abdominal pain. Negative for vomiting and diarrhea.  Genitourinary: Negative for dysuria, urgency, frequency, vaginal bleeding and vaginal discharge.  Musculoskeletal: Negative for back pain.  All other systems reviewed and are negative.    Allergies  Hydrocodone-acetaminophen  Home Medications  No current outpatient prescriptions on file.  BP 112/66  Pulse 65   Temp(Src) 98.1 F (36.7 C) (Oral)  Resp 18  Ht 5\' 5"  (1.651 m)  Wt 150 lb (68.04 kg)  BMI 24.96 kg/m2  SpO2 100%  LMP 03/12/2011  Physical Exam  Nursing note and vitals reviewed. Constitutional: She is oriented to person, place, and time. She appears well-developed.  HENT:  Head: Normocephalic and atraumatic.  Eyes: Pupils are equal, round, and reactive to light.  Cardiovascular: Normal rate and regular rhythm.   Pulmonary/Chest: Effort normal and breath sounds normal.  Abdominal: Soft.       Epigastric and right upper quadrant tenderness  Musculoskeletal: Normal range of motion.  Neurological: She is alert and oriented to person, place, and time.  Skin: Skin is warm and dry.  Psychiatric: She has a normal mood and affect.    ED Course  Procedures (including critical care time)  Labs Reviewed  CBC - Abnormal; Notable for the following:    RBC 3.65 (*)    Hemoglobin 10.9 (*)    HCT 30.5 (*)    All other components within normal limits  COMPREHENSIVE METABOLIC PANEL - Abnormal; Notable for the following:    Potassium 3.0 (*)    Total Bilirubin 0.2 (*)    All other components within normal limits  URINALYSIS, ROUTINE W REFLEX MICROSCOPIC - Abnormal; Notable for the following:    Hgb urine dipstick TRACE (*)    All other components within normal limits  DIFFERENTIAL  LIPASE, BLOOD  PREGNANCY, URINE  URINE MICROSCOPIC-ADD ON   US  Abdomen Complete  03/26/2011  *RADIOLOGY REPORT*  Clinical Data:  Epigastric abdominal pain.  COMPLETE ABDOMINAL ULTRASOUND  Comparison:  10/13/2010  Findings:  Gallbladder:  Normal without stones, sludge or wall thickening.  Common bile duct:  Normal at 1-2 mm.  Liver:  Normal echogenicity.  No focal lesions.  IVC:  Poorly seen because of overlying bowel gas.  Pancreas:  Poorly seen because of overlying bowel gas.  Spleen:  Normal at 7.8 cm.  Right Kidney:  Normal 11.4 cm.  Normal echogenicity.  No cyst, mass, stone or hydronephrosis.  Left  Kidney:  Similarly normal at 13.0 cm.  Abdominal aorta:  Poorly seen because of overlying bowel gas.  No aneurysm identified.  No ascites  IMPRESSION: Normal abdominal ultrasound.  Suboptimal visualization of the aorta, IVC and pancreas because of overlying bowel gas.  Original Report Authenticated By: Thomasenia Sales, M.D.     1. Abdominal pain   2. Hypokalemia       MDM  Pt treated for hypokalemia:discussed gi follow up with pt:pt is okay to follow up for continued or worsening symptoms:will treat symptomatically        Teressa Lower, NP 03/26/11 1830  Teressa Lower, NP 03/26/11 1610

## 2011-03-26 NOTE — ED Notes (Signed)
abd pain-nausea-denies urinary symptoms

## 2011-03-30 ENCOUNTER — Emergency Department (HOSPITAL_COMMUNITY)
Admission: EM | Admit: 2011-03-30 | Discharge: 2011-03-30 | Discharge status: Against Medical Advice | Payer: Self-pay | Attending: Emergency Medicine | Admitting: Emergency Medicine

## 2011-03-30 DIAGNOSIS — R109 Unspecified abdominal pain: Secondary | ICD-10-CM | POA: Insufficient documentation

## 2011-03-30 DIAGNOSIS — R110 Nausea: Secondary | ICD-10-CM | POA: Insufficient documentation

## 2011-03-31 ENCOUNTER — Emergency Department (INDEPENDENT_AMBULATORY_CARE_PROVIDER_SITE_OTHER): Payer: Self-pay

## 2011-03-31 ENCOUNTER — Encounter (HOSPITAL_BASED_OUTPATIENT_CLINIC_OR_DEPARTMENT_OTHER): Payer: Self-pay | Admitting: *Deleted

## 2011-03-31 ENCOUNTER — Emergency Department (HOSPITAL_BASED_OUTPATIENT_CLINIC_OR_DEPARTMENT_OTHER)
Admission: EM | Admit: 2011-03-31 | Discharge: 2011-03-31 | Disposition: A | Discharge status: Home / Self Care | Payer: Self-pay | Attending: Emergency Medicine | Admitting: Emergency Medicine

## 2011-03-31 DIAGNOSIS — F17200 Nicotine dependence, unspecified, uncomplicated: Secondary | ICD-10-CM | POA: Insufficient documentation

## 2011-03-31 DIAGNOSIS — R110 Nausea: Secondary | ICD-10-CM | POA: Insufficient documentation

## 2011-03-31 DIAGNOSIS — R1033 Periumbilical pain: Secondary | ICD-10-CM | POA: Insufficient documentation

## 2011-03-31 DIAGNOSIS — R109 Unspecified abdominal pain: Secondary | ICD-10-CM

## 2011-03-31 HISTORY — DX: Reserved for concepts with insufficient information to code with codable children: IMO0002

## 2011-03-31 LAB — OCCULT BLOOD X 1 CARD TO LAB, STOOL: Fecal Occult Bld: NEGATIVE

## 2011-03-31 MED ORDER — SUCRALFATE 1 G PO TABS
1.0000 g | ORAL_TABLET | Freq: Once | ORAL | Status: AC
  Administered 2011-03-31: 1 g via ORAL
  Filled 2011-03-31: qty 1

## 2011-03-31 MED ORDER — IOHEXOL 300 MG/ML  SOLN
100.0000 mL | Freq: Once | INTRAMUSCULAR | Status: AC | PRN
  Administered 2011-03-31: 100 mL via INTRAVENOUS

## 2011-03-31 MED ORDER — SUCRALFATE 1 G PO TABS: ORAL_TABLET | ORAL | Status: DC

## 2011-03-31 MED ORDER — GI COCKTAIL ~~LOC~~
30.0000 mL | Freq: Once | ORAL | Status: AC
  Administered 2011-03-31: 30 mL via ORAL
  Filled 2011-03-31: qty 30

## 2011-03-31 MED ORDER — SODIUM CHLORIDE 0.9 % IV SOLN: Freq: Once | INTRAVENOUS | Status: DC

## 2011-03-31 NOTE — ED Provider Notes (Signed)
Scribed for Jenny Seamen, MD, the patient was seen in room MH04/MH04 . This chart was scribed by Ellie Lunch. This patient's care was started at 12:26 AM.   CSN: [id number] Arrival date & time: 03/31/2011 12:19 AM  Chief Complaint  Patient presents with  . Abdominal Pain    (Consider location/radiation/quality/duration/timing/severity/associated sxs/prior treatment) HPI Jenny Evans is a 40 y.o. female who presents to the Emergency Department complaining of abdominal pain starting more than a week ago. Pt describes pain as a nagging pain that is constant and severe. Pt was seen in ED on Wednesday 4 days ago reports she was given oxycodone-acetaminophen which improved her pain. Pt reports that she currently has the same pain with some associated nausea. Pt says she has also had some black stool, although she has not had a BM today. Pt's symptoms are affected by what she eats. Pt reports some foods such as steak aggravate her pain. Pt reports pain is different from symptoms she experiences with acid reflux. There are no other associated symptoms and no other alleviating or aggravating factors.    Past Medical History  Diagnosis Date  . Migraine   . GERD (gastroesophageal reflux disease)   . Ulcer     Past Surgical History  Procedure Date  . Myringotomy     History reviewed. No pertinent family history.  History  Substance Use Topics  . Smoking status: Current Everyday Smoker  . Smokeless tobacco: Not on file  . Alcohol Use: No   Review of Systems  Gastrointestinal: Positive for nausea and abdominal pain. Negative for vomiting and diarrhea.       Black stools  All other systems reviewed and are negative.    Allergies  Hydrocodone-acetaminophen  Home Medications   Current Outpatient Rx  Name Route Sig Dispense Refill  . AMITRIPTYLINE HCL 50 MG PO TABS Oral Take 100 mg by mouth at bedtime.      . BC HEADACHE POWDER PO Oral Take 1 packet by mouth once.      .  ESOMEPRAZOLE MAGNESIUM 40 MG PO CPDR Oral Take 40 mg by mouth 2 (two) times daily.      . OXYCODONE-ACETAMINOPHEN 5-325 MG PO TABS Oral Take 1 tablet by mouth every 4 (four) hours as needed for pain. 10 tablet 0  . SUMATRIPTAN SUCCINATE 6 MG/0.5ML [place] SOLN Subcutaneous Inject 6 mg into the skin every 2 (two) hours as needed. For migraines. No more than 2 injections within 24 hours       BP 109/77  Pulse 48  Temp(Src) 98.1 F (36.7 C) (Oral)  Resp 20  Ht 5\' 5"  (1.651 m)  Wt 150 lb (68.04 kg)  BMI 24.96 kg/m2  SpO2 100%  LMP 03/12/2011  Physical Exam  Constitutional: She is oriented to person, place, and time. She appears well-developed and well-nourished.  HENT:  Head: Normocephalic and atraumatic.  Eyes: Conjunctivae and EOM are normal. No scleral icterus.  Neck: Neck supple.  Cardiovascular: Normal rate, regular rhythm and normal heart sounds.   Pulmonary/Chest: Effort normal and breath sounds normal.  Abdominal: Bowel sounds are normal. She exhibits no distension. There is tenderness (moderate periumbilical tenderness).  Genitourinary: Rectum normal. Rectal exam shows anal tone normal.       Dark stool present in vault.   Musculoskeletal: Normal range of motion. She exhibits no tenderness.  Neurological: She is alert and oriented to person, place, and time.  Skin: Skin is warm and dry.  Psychiatric: She has a  normal mood and affect.   Procedures (including critical care time)  OTHER DATA REVIEWED: Nursing notes, vital signs, and past medical records reviewed.  DIAGNOSTIC STUDIES: Oxygen Saturation is 100% on room air, normal by my interpretation.    Nursing notes and vitals signs, including pulse oximetry, reviewed.  Summary of previous visit's results, reviewed by myself:  Labs:  Results for orders placed during the hospital encounter of 03/26/11  CBC      Component Value Range   WBC 7.0  4.0 - 10.5 (K/uL)   RBC 3.65 (*) 3.87 - 5.11 (MIL/uL)   Hemoglobin 10.9 (*)  12.0 - 15.0 (g/dL)   HCT 45.4 (*) 09.8 - 46.0 (%)   MCV 83.6  78.0 - 100.0 (fL)   MCH 29.9  26.0 - 34.0 (pg)   MCHC 35.7  30.0 - 36.0 (g/dL)   RDW 11.9  14.7 - 82.9 (%)   Platelets 386  150 - 400 (K/uL)  DIFFERENTIAL      Component Value Range   Neutrophils Relative 46  43 - 77 (%)   Neutro Abs 3.3  1.7 - 7.7 (K/uL)   Lymphocytes Relative 45  12 - 46 (%)   Lymphs Abs 3.2  0.7 - 4.0 (K/uL)   Monocytes Relative 7  3 - 12 (%)   Monocytes Absolute 0.5  0.1 - 1.0 (K/uL)   Eosinophils Relative 1  0 - 5 (%)   Eosinophils Absolute 0.1  0.0 - 0.7 (K/uL)   Basophils Relative 0  0 - 1 (%)   Basophils Absolute 0.0  0.0 - 0.1 (K/uL)  COMPREHENSIVE METABOLIC PANEL      Component Value Range   Sodium 139  135 - 145 (mEq/L)   Potassium 3.0 (*) 3.5 - 5.1 (mEq/L)   Chloride 106  96 - 112 (mEq/L)   CO2 24  19 - 32 (mEq/L)   Glucose, Bld 86  70 - 99 (mg/dL)   BUN 10  6 - 23 (mg/dL)   Creatinine, Ser 5.62  0.50 - 1.10 (mg/dL)   Calcium 9.4  8.4 - 13.0 (mg/dL)   Total Protein 6.9  6.0 - 8.3 (g/dL)   Albumin 4.0  3.5 - 5.2 (g/dL)   AST 19  0 - 37 (U/L)   ALT 13  0 - 35 (U/L)   Alkaline Phosphatase 88  39 - 117 (U/L)   Total Bilirubin 0.2 (*) 0.3 - 1.2 (mg/dL)   GFR calc non Af Amer >90  >90 (mL/min)   GFR calc Af Amer >90  >90 (mL/min)  LIPASE, BLOOD      Component Value Range   Lipase 42  11 - 59 (U/L)  URINALYSIS, ROUTINE W REFLEX MICROSCOPIC      Component Value Range   Color, Urine YELLOW  YELLOW    Appearance CLEAR  CLEAR    Specific Gravity, Urine 1.015  1.005 - 1.030    pH 6.5  5.0 - 8.0    Glucose, UA NEGATIVE  NEGATIVE (mg/dL)   Hgb urine dipstick TRACE (*) NEGATIVE    Bilirubin Urine NEGATIVE  NEGATIVE    Ketones, ur NEGATIVE  NEGATIVE (mg/dL)   Protein, ur NEGATIVE  NEGATIVE (mg/dL)   Urobilinogen, UA 1.0  0.0 - 1.0 (mg/dL)   Nitrite NEGATIVE  NEGATIVE    Leukocytes, UA NEGATIVE  NEGATIVE   PREGNANCY, URINE      Component Value Range   Preg Test, Ur NEGATIVE    URINE  MICROSCOPIC-ADD ON  Component Value Range   Squamous Epithelial / LPF RARE  RARE    RBC / HPF 0-2  <3 (RBC/hpf)   Bacteria, UA RARE  RARE     Imaging Studies: US Abdomen Complete  03/26/2011  *RADIOLOGY REPORT*  Clinical Data:  Epigastric abdominal pain.  COMPLETE ABDOMINAL ULTRASOUND  Comparison:  10/13/2010  Findings:  Gallbladder:  Normal without stones, sludge or wall thickening.  Common bile duct:  Normal at 1-2 mm.  Liver:  Normal echogenicity.  No focal lesions.  IVC:  Poorly seen because of overlying bowel gas.  Pancreas:  Poorly seen because of overlying bowel gas.  Spleen:  Normal at 7.8 cm.  Right Kidney:  Normal 11.4 cm.  Normal echogenicity.  No cyst, mass, stone or hydronephrosis.  Left Kidney:  Similarly normal at 13.0 cm.  Abdominal aorta:  Poorly seen because of overlying bowel gas.  No aneurysm identified.  No ascites  IMPRESSION: Normal abdominal ultrasound.  Suboptimal visualization of the aorta, IVC and pancreas because of overlying bowel gas.  Original Report Authenticated By: Thomasenia Sales, M.D.   1:17 AM Equivocal improvement with GI cocktail. Nursing notes and vitals signs, including pulse oximetry, reviewed.  Nursing notes and vitals signs, including pulse oximetry, reviewed.  Summary of this visit's results, reviewed by myself:  Labs:  Results for orders placed during the hospital encounter of 03/31/11  OCCULT BLOOD X 1 CARD TO LAB, STOOL      Component Value Range   Fecal Occult Bld NEGATIVE      Imaging Studies:  Ct Abdomen Pelvis W Contrast  03/31/2011  *RADIOLOGY REPORT*  Clinical Data: Abdominal pain and nausea.  CT ABDOMEN AND PELVIS WITH CONTRAST  Technique:  Multidetector CT imaging of the abdomen and pelvis was performed following the standard protocol during bolus administration of intravenous contrast.  Contrast: OMNIPAQUE IOHEXOL 300 MG/ML IV SOLN  Comparison: 10/13/2010  Findings: Mild dependent changes in the lung bases.  The liver,  spleen, gallbladder, pancreas, adrenal glands, kidneys, abdominal aorta, and retroperitoneal lymph nodes are unremarkable.  The stomach and small bowel are decompressed.  Small accessory spleens. No free fluid or free air in the abdomen.  The colon is decompressed.  Colonic wall thickening cannot be evaluated.  Pelvis:  The appendix is normal.  No inflammatory changes in the sigmoid colon.  Ovaries are somewhat prominent in size but are symmetrical bilaterally and stable since the prior study.  The uterus is retroverted and is not enlarged.  No bladder wall thickening.  No free or loculated pelvic fluid collections.  Normal alignment of the lumbar vertebrae.  IMPRESSION: No acute process demonstrated in the abdomen or pelvis.  Original Report Authenticated By: [name], M.D.   3:34 AM Given negative laboratory studies, ultrasound and CT scan the most likely etiology is gastritis or peptic ulcer disease. Patient is already on Nexium. Will add Carafate.    Jenny Seamen, MD 03/31/11 [phone]

## 2011-03-31 NOTE — ED Notes (Signed)
MD at bedside. EDP [place] in to assess pt

## 2011-03-31 NOTE — ED Notes (Signed)
Transported to CT scan

## 2011-03-31 NOTE — ED Notes (Signed)
MD at bedside. EDP [place] in to reassess pt

## 2011-03-31 NOTE — ED Notes (Signed)
Pt seen here last Wed and dx'd with an ulcer. Given pain meds which helped. Now continues to have pain.

## 2011-05-25 ENCOUNTER — Encounter (HOSPITAL_BASED_OUTPATIENT_CLINIC_OR_DEPARTMENT_OTHER): Payer: Self-pay | Admitting: Emergency Medicine

## 2011-05-25 ENCOUNTER — Emergency Department (HOSPITAL_COMMUNITY): Admission: EM | Admit: 2011-05-25 | Discharge: 2011-05-25 | Discharge status: Against Medical Advice | Payer: Self-pay

## 2011-05-25 ENCOUNTER — Emergency Department (HOSPITAL_BASED_OUTPATIENT_CLINIC_OR_DEPARTMENT_OTHER)
Admission: EM | Admit: 2011-05-25 | Discharge: 2011-05-25 | Disposition: A | Discharge status: Home / Self Care | Payer: Self-pay | Attending: Emergency Medicine | Admitting: Emergency Medicine

## 2011-05-25 DIAGNOSIS — R51 Headache: Secondary | ICD-10-CM | POA: Insufficient documentation

## 2011-05-25 MED ORDER — PROMETHAZINE HCL 25 MG/ML IJ SOLN
25.0000 mg | Freq: Once | INTRAMUSCULAR | Status: AC
  Administered 2011-05-25: 25 mg via INTRAMUSCULAR

## 2011-05-25 MED ORDER — KETOROLAC TROMETHAMINE 60 MG/2ML IM SOLN
60.0000 mg | Freq: Once | INTRAMUSCULAR | Status: AC
  Administered 2011-05-25: 60 mg via INTRAMUSCULAR
  Filled 2011-05-25: qty 2

## 2011-05-25 MED ORDER — DIPHENHYDRAMINE HCL 50 MG/ML IJ SOLN: 25.0000 mg | Freq: Once | INTRAMUSCULAR | Status: DC

## 2011-05-25 MED ORDER — DIPHENHYDRAMINE HCL 50 MG/ML IJ SOLN
25.0000 mg | Freq: Once | INTRAMUSCULAR | Status: AC
  Administered 2011-05-25: 25 mg via INTRAMUSCULAR
  Filled 2011-05-25: qty 1

## 2011-05-25 MED ORDER — METHYLPREDNISOLONE SODIUM SUCC 125 MG IJ SOLR: 250.0000 mg | Freq: Once | INTRAMUSCULAR | Status: DC

## 2011-05-25 MED ORDER — KETOROLAC TROMETHAMINE 30 MG/ML IJ SOLN: 30.0000 mg | Freq: Once | INTRAMUSCULAR | Status: DC

## 2011-05-25 MED ORDER — PROMETHAZINE HCL 25 MG/ML IJ SOLN
25.0000 mg | Freq: Once | INTRAMUSCULAR | Status: DC
  Filled 2011-05-25: qty 1

## 2011-05-25 MED ORDER — PROMETHAZINE HCL 25 MG/ML IJ SOLN: 25.0000 mg | Freq: Once | INTRAMUSCULAR | Status: DC

## 2011-05-25 MED ORDER — SODIUM CHLORIDE 0.9 % IV BOLUS (SEPSIS): 1000.0000 mL | Freq: Once | INTRAVENOUS | Status: DC

## 2011-05-25 NOTE — ED Provider Notes (Signed)
History     CSN: [id number] Arrival date & time: 05/25/2011  9:59 AM   First MD Initiated Contact with Patient 05/25/11 1033      Chief Complaint  Patient presents with  . Migraine  . Headache    (Consider location/radiation/quality/duration/timing/severity/associated sxs/prior treatment) HPI Comments: Patient presented complaining of headache that is her typical migraine and cluster headache.  Patient notes that it began 1-2 days ago and has been not relieved by her Imitrex.  It is left-sided primarily in associated with some mild nausea and light sensitivity.  She has no fevers.  No neck stiffness.  It was not sudden in onset and is not worse than her normal migraine headaches.  She does have followup with a neurologist in [place] later this week.  Patient is a 40 y.o. female presenting with migraine and headaches. The history is provided by the patient. No language interpreter was used.  Migraine This is a recurrent problem. The current episode started 2 days ago. The problem occurs constantly. The problem has not changed since onset.Associated symptoms include headaches. Pertinent negatives include no chest pain, no abdominal pain and no shortness of breath.  Headache  Associated symptoms include nausea. Pertinent negatives include no fever, no shortness of breath and no vomiting.    Past Medical History  Diagnosis Date  . Migraine   . GERD (gastroesophageal reflux disease)   . Ulcer     Past Surgical History  Procedure Date  . Myringotomy     History reviewed. No pertinent family history.  History  Substance Use Topics  . Smoking status: Current Everyday Smoker  . Smokeless tobacco: Not on file  . Alcohol Use: No    OB History    Grav Para Term Preterm Abortions TAB SAB Ect Mult Living                  Review of Systems  Constitutional: Negative.  Negative for fever and chills.  Eyes: Positive for photophobia. Negative for discharge and redness.    Respiratory: Negative.  Negative for cough and shortness of breath.   Cardiovascular: Negative.  Negative for chest pain.  Gastrointestinal: Positive for nausea. Negative for vomiting, abdominal pain and diarrhea.  Genitourinary: Negative.  Negative for dysuria and vaginal discharge.  Musculoskeletal: Negative.  Negative for back pain.  Skin: Negative.  Negative for color change and rash.  Neurological: Positive for headaches. Negative for syncope.  Hematological: Negative.  Negative for adenopathy.  Psychiatric/Behavioral: Negative.  Negative for confusion.  All other systems reviewed and are negative.    Allergies  Hydrocodone-acetaminophen  Home Medications   Current Outpatient Rx  Name Route Sig Dispense Refill  . OXYCODONE-ACETAMINOPHEN 5-325 MG PO TABS Oral Take 1 tablet by mouth every 4 (four) hours as needed.      [name] AMITRIPTYLINE HCL 50 MG PO TABS Oral Take 100 mg by mouth at bedtime.      . BC HEADACHE POWDER PO Oral Take 1 packet by mouth once.      . ESOMEPRAZOLE MAGNESIUM 40 MG PO CPDR Oral Take 40 mg by mouth 2 (two) times daily.      . SUCRALFATE 1 G PO TABS  Take one tablet 3 times a day with meals and at bedtime the 40 tablet 0  . SUMATRIPTAN SUCCINATE 6 MG/0.5ML [place] SOLN Subcutaneous Inject 6 mg into the skin every 2 (two) hours as needed. For migraines. No more than 2 injections within 24 hours  BP 104/57  Pulse 66  Temp(Src) 98.3 F (36.8 C) (Oral)  Resp 22  Ht 5\' 5"  (1.651 m)  Wt 150 lb (68.04 kg)  BMI 24.96 kg/m2  SpO2 100%  LMP 05/11/2011  Physical Exam  Constitutional: She is oriented to person, place, and time. She appears well-developed and well-nourished.  Non-toxic appearance. She does not have a sickly appearance.  HENT:  Head: Normocephalic and atraumatic.  Eyes: Conjunctivae, EOM and lids are normal. Pupils are equal, round, and reactive to light. No scleral icterus.  Neck: Trachea normal and normal range of motion. Neck supple.   Cardiovascular: Normal rate, regular rhythm and normal heart sounds.   Pulmonary/Chest: Effort normal and breath sounds normal. No respiratory distress. She has no wheezes. She has no rales.  Abdominal: Soft. Normal appearance. There is no tenderness. There is no rebound, no guarding and no CVA tenderness.  Musculoskeletal: Normal range of motion.  Neurological: She is alert and oriented to person, place, and time. She has normal strength.  Skin: Skin is warm, dry and intact. No rash noted.  Psychiatric: She has a normal mood and affect. Her behavior is normal. Judgment and thought content normal.    ED Course  Procedures (including critical care time)  Labs Reviewed - No data to display No results found.   No diagnosis found.    MDM  Patient presents with her normal migraine and cluster headaches.  I will treat patient's pain and when improved I believe she'll be safe for discharge home.  She denies symptoms are consistent with infection such as meningitis or encephalitis.  Her story is not consistent with subarachnoid hemorrhage at this time.        Nat Christen, MD 05/25/11 1131

## 2011-05-25 NOTE — ED Notes (Signed)
Pt having migraine x 2 weeks, worse this am.  Sensitivity to light and sound.  Some nausea.  Pt states headache is similar to regular migraines.

## 2011-05-25 NOTE — ED Notes (Signed)
Pt refused IV, requests IM injections; Dr. Golda Acre made aware & OKs IM administration.

## 2011-07-21 ENCOUNTER — Encounter (HOSPITAL_BASED_OUTPATIENT_CLINIC_OR_DEPARTMENT_OTHER): Payer: Self-pay | Admitting: *Deleted

## 2011-07-21 ENCOUNTER — Emergency Department (HOSPITAL_BASED_OUTPATIENT_CLINIC_OR_DEPARTMENT_OTHER)
Admission: EM | Admit: 2011-07-21 | Discharge: 2011-07-21 | Discharge status: Against Medical Advice | Payer: Managed Care, Other (non HMO) | Attending: Emergency Medicine | Admitting: Emergency Medicine

## 2011-07-21 DIAGNOSIS — R51 Headache: Secondary | ICD-10-CM | POA: Insufficient documentation

## 2011-07-21 NOTE — ED Notes (Signed)
Headache x 3 weeks. Hx of cluster headaches.

## 2011-07-21 NOTE — ED Notes (Signed)
States she has been getting relief with Imitrex but no relief this am.

## 2011-07-23 ENCOUNTER — Encounter (HOSPITAL_COMMUNITY): Payer: Self-pay | Admitting: Emergency Medicine

## 2011-07-23 ENCOUNTER — Emergency Department (HOSPITAL_COMMUNITY)
Admission: EM | Admit: 2011-07-23 | Discharge: 2011-07-23 | Disposition: A | Discharge status: Home Health | Payer: Managed Care, Other (non HMO) | Attending: Emergency Medicine | Admitting: Emergency Medicine

## 2011-07-23 DIAGNOSIS — H53149 Visual discomfort, unspecified: Secondary | ICD-10-CM | POA: Insufficient documentation

## 2011-07-23 DIAGNOSIS — R110 Nausea: Secondary | ICD-10-CM | POA: Insufficient documentation

## 2011-07-23 DIAGNOSIS — R51 Headache: Secondary | ICD-10-CM | POA: Insufficient documentation

## 2011-07-23 DIAGNOSIS — R519 Headache, unspecified: Secondary | ICD-10-CM

## 2011-07-23 DIAGNOSIS — H9209 Otalgia, unspecified ear: Secondary | ICD-10-CM | POA: Insufficient documentation

## 2011-07-23 DIAGNOSIS — K219 Gastro-esophageal reflux disease without esophagitis: Secondary | ICD-10-CM | POA: Insufficient documentation

## 2011-07-23 MED ORDER — PROMETHAZINE HCL 25 MG/ML IJ SOLN
25.0000 mg | INTRAMUSCULAR | Status: AC
  Administered 2011-07-23: 25 mg via INTRAMUSCULAR
  Filled 2011-07-23: qty 1

## 2011-07-23 MED ORDER — KETOROLAC TROMETHAMINE 60 MG/2ML IM SOLN
60.0000 mg | Freq: Once | INTRAMUSCULAR | Status: AC
  Administered 2011-07-23: 60 mg via INTRAMUSCULAR
  Filled 2011-07-23: qty 2

## 2011-07-23 NOTE — ED Provider Notes (Signed)
History     CSN: [id number]  Arrival date & time 07/23/11  0731   First MD Initiated Contact with Patient 07/23/11 0757      Chief Complaint  Patient presents with  . Otalgia  . Headache    (Consider location/radiation/quality/duration/timing/severity/associated sxs/prior treatment) HPI Comments: Patient presents emergency Department with chief complaint of typical migraine cluster headache and left earache.  This is a recurring problem.  The current episode has been for 7 days.  Patient states that she's taken 2 Imitrex shots a day for the last 5 days.  Patient states her headache is still unresolved.  Exacerbating factors are light and noise.  Headaches are associated with nausea. Pt is currently seeing a neurologist at [facility] (she can not recall name) and has been given amlodipine and Imitrex. In addition patient states that her left-sided otalgia is chronic as well and has been occurring on and off for the last 2 years.  Patient has seen an ENT at [facility] who diagnosed her with chronic otitis media and fever bilateral ear tubes.  The symptoms have since  will fallen out. in her patient denies fevers, night sweats, chills, jaw pain, neck pain, nuchal rigidity, change in vision, trismus, tinnitus, ear discharge or decreased hearing.   The history is provided by the patient.    Past Medical History  Diagnosis Date  . Migraine   . GERD (gastroesophageal reflux disease)   . Ulcer     Past Surgical History  Procedure Date  . Myringotomy     No family history on file.  History  Substance Use Topics  . Smoking status: Current Everyday Smoker  . Smokeless tobacco: Not on file  . Alcohol Use: No    OB History    Grav Para Term Preterm Abortions TAB SAB Ect Mult Living                  Review of Systems  Constitutional: Positive for activity change. Negative for fever, chills, diaphoresis and fatigue.  HENT: Positive for ear pain. Negative for congestion, facial swelling,  neck pain, neck stiffness, sinus pressure and tinnitus.   Eyes: Positive for photophobia. Negative for redness and visual disturbance.  Respiratory: Negative for cough, shortness of breath, wheezing and stridor.   Cardiovascular: Negative for chest pain.  Gastrointestinal: Positive for nausea. Negative for vomiting and abdominal pain.  Musculoskeletal: Negative for myalgias and gait problem.  Skin: Negative for rash.  Neurological: Positive for headaches. Negative for dizziness, syncope, speech difficulty, weakness, light-headedness and numbness.       No bowel or bladder incontinence.  Psychiatric/Behavioral: Negative for confusion.  All other systems reviewed and are negative.    Allergies  Hydrocodone-acetaminophen  Home Medications   Current Outpatient Rx  Name Route Sig Dispense Refill  . AMITRIPTYLINE HCL 50 MG PO TABS Oral Take 100 mg by mouth at bedtime.      . BC HEADACHE POWDER PO Oral Take 1 packet by mouth once.      . ESOMEPRAZOLE MAGNESIUM 40 MG PO CPDR Oral Take 40 mg by mouth 2 (two) times daily.      . SUMATRIPTAN SUCCINATE 6 MG/0.5ML  SOLN Subcutaneous Inject 6 mg into the skin every 2 (two) hours as needed. For migraines. No more than 2 injections within 24 hours       BP 139/71  Pulse 72  Temp(Src) 97.6 F (36.4 C) (Oral)  Resp 18  SpO2 100%  Physical Exam  Nursing note and vitals  reviewed. Constitutional: She is oriented to person, place, and time. She appears well-developed and well-nourished. No distress.  HENT:  Head: Normocephalic and atraumatic.  Right Ear: External ear normal.  Left Ear: External ear normal.       No tragal or mastoid tenderness. Normal TM & canal bilaterally. No sign of middle ear effusion or bulging TM.   Eyes: Conjunctivae and EOM are normal. Pupils are equal, round, and reactive to light. Right eye exhibits no discharge. Left eye exhibits no discharge. Right conjunctiva is not injected. Right conjunctiva has no hemorrhage.  Left conjunctiva is not injected. Left conjunctiva has no hemorrhage. No scleral icterus. Right eye exhibits no nystagmus. Left eye exhibits no nystagmus.  Neck: Normal range of motion and full passive range of motion without pain. Neck supple. No spinous process tenderness present. No rigidity.  Cardiovascular: Normal rate, regular rhythm, normal heart sounds and intact distal pulses.   Pulmonary/Chest: Effort normal and breath sounds normal. No respiratory distress.  Musculoskeletal: Normal range of motion.  Neurological: She is alert and oriented to person, place, and time. She has normal strength. No cranial nerve deficit or sensory deficit. Coordination and gait normal.  Skin: Skin is warm and dry. No rash noted. She is not diaphoretic.    ED Course  Procedures (including critical care time)  Labs Reviewed - No data to display No results found.   No diagnosis found.    MDM  HA  Pt states HA is like typical migraine cluster type & denies neurological s/s. Patient's headache has been managed in the emergency department with Toradol and, Phenergan, and by mouth fluids.  Patient states that her headache has improved and that she will followup with her doctor.  Discussed thoroughly that the over use of Imitrex can cause rebound headaches.  Recommended patient to orally hydrate.        Jaci Carrel, [place] 07/23/11 [phone]

## 2011-07-23 NOTE — ED Notes (Signed)
Pt states that her lt ear and lt side of head has been hurting for 3 days now, no emesis, alert x4, no weakness,

## 2011-07-25 ENCOUNTER — Encounter (HOSPITAL_COMMUNITY): Payer: Self-pay

## 2011-07-25 ENCOUNTER — Emergency Department (INDEPENDENT_AMBULATORY_CARE_PROVIDER_SITE_OTHER)
Admission: EM | Admit: 2011-07-25 | Discharge: 2011-07-25 | Disposition: A | Discharge status: Home / Self Care | Payer: Managed Care, Other (non HMO) | Source: Home / Self Care | Attending: Emergency Medicine | Admitting: Emergency Medicine

## 2011-07-25 DIAGNOSIS — R51 Headache: Secondary | ICD-10-CM

## 2011-07-25 DIAGNOSIS — H9209 Otalgia, unspecified ear: Secondary | ICD-10-CM

## 2011-07-25 LAB — POCT RAPID STREP A: Streptococcus, Group A Screen (Direct): NEGATIVE

## 2011-07-25 MED ORDER — SUMATRIPTAN SUCCINATE 6 MG/0.5ML ~~LOC~~ SOLN: 6.0000 mg | Freq: Once | SUBCUTANEOUS | Status: DC

## 2011-07-25 MED ORDER — ACETAMINOPHEN-CODEINE #3 300-30 MG PO TABS: 1.0000 | ORAL_TABLET | Freq: Four times a day (QID) | ORAL | Status: AC | PRN

## 2011-07-25 NOTE — ED Provider Notes (Signed)
History     CSN: [id number]  Arrival date & time 07/25/11  4098   First MD Initiated Contact with Patient 07/25/11 [phone]      Chief Complaint  Patient presents with  . Otalgia  . Headache    (Consider location/radiation/quality/duration/timing/severity/associated sxs/prior treatment) Patient is a 41 y.o. female presenting with ear pain and headaches. The history is provided by the patient.  Otalgia This is a recurrent problem. There is pain in the left ear. The problem occurs constantly. The problem has not changed since onset.There has been no fever. Associated symptoms include headaches. Pertinent negatives include no neck pain.  Headache The primary symptoms include headaches.    Past Medical History  Diagnosis Date  . Migraine   . GERD (gastroesophageal reflux disease)   . Ulcer     Past Surgical History  Procedure Date  . Myringotomy     No family history on file.  History  Substance Use Topics  . Smoking status: Current Everyday Smoker  . Smokeless tobacco: Not on file  . Alcohol Use: No    OB History    Grav Para Term Preterm Abortions TAB SAB Ect Mult Living                  Review of Systems  HENT: Positive for ear pain. Negative for neck pain.   Neurological: Positive for headaches.    Allergies  Hydrocodone-acetaminophen  Home Medications   Current Outpatient Rx  Name Route Sig Dispense Refill  . AMITRIPTYLINE HCL 50 MG PO TABS Oral Take 100 mg by mouth at bedtime.      . BC HEADACHE POWDER PO Oral Take 1 packet by mouth once.      . ESOMEPRAZOLE MAGNESIUM 40 MG PO CPDR Oral Take 40 mg by mouth 2 (two) times daily.      . SUMATRIPTAN SUCCINATE 6 MG/0.5ML [place] SOLN Subcutaneous Inject 6 mg into the skin every 2 (two) hours as needed. For migraines. No more than 2 injections within 24 hours     . ACETAMINOPHEN-CODEINE #3 300-30 MG PO TABS Oral Take 1-2 tablets by mouth every 6 (six) hours as needed for pain. 15 tablet 0  . SUMATRIPTAN SUCCINATE  6 MG/0.5ML [place] SOLN Subcutaneous Inject 0.5 mLs (6 mg total) into the skin once. F 1 vial 0    BP 130/86  Pulse 78  Temp(Src) 97.9 F (36.6 C) (Oral)  Resp 18  SpO2 98%  LMP 07/18/2011  Physical Exam  Nursing note and vitals reviewed. Constitutional: She is oriented to person, place, and time. She appears well-developed and well-nourished. No distress.  HENT:  Head: Normocephalic.  Right Ear: Tympanic membrane normal. No drainage or tenderness. No foreign bodies. Tympanic membrane is not perforated and not erythematous. No middle ear effusion.  Left Ear: Tympanic membrane normal. No drainage or tenderness. No foreign bodies. Tympanic membrane is not perforated and not erythematous.  No middle ear effusion.  Mouth/Throat: Uvula is midline and mucous membranes are normal. No uvula swelling. No oropharyngeal exudate, posterior oropharyngeal erythema or tonsillar abscesses.  Eyes: Conjunctivae are normal. Right eye exhibits no discharge. Left eye exhibits no discharge.  Neck: Neck supple. No JVD present. No tracheal deviation present. No thyromegaly present.  Lymphadenopathy:    She has no cervical adenopathy.  Neurological: She is alert and oriented to person, place, and time. She has normal strength. She displays tremor. She displays no atrophy. No cranial nerve deficit or sensory deficit. She exhibits normal muscle  tone.  Skin: Skin is warm. No erythema.    ED Course  Procedures (including critical care time)   Labs Reviewed  POCT RAPID STREP A ([facility] CARE ONLY)   No results found.   1. Otalgia   2. Headache       MDM  Patient presents with recurrent ongoing symptoms previously evaluated in the emergency department today cystoscopy and seen at [facility] by home neurologist in your doctor. No didn't        Jimmie Molly, MD 07/25/11 1045

## 2011-07-25 NOTE — ED Notes (Signed)
C/o lt ear and neck pain pain for 2 weeks. Also c/o  intermittent headache to lt temporal area for 2 weeks- out of her IM imitrex.

## 2011-07-26 NOTE — ED Provider Notes (Signed)
Medical screening examination/treatment/procedure(s) were performed by non-physician practitioner and as supervising physician I was immediately available for consultation/collaboration.   Forbes Cellar, MD 07/26/11 [phone]

## 2011-07-29 ENCOUNTER — Emergency Department (HOSPITAL_BASED_OUTPATIENT_CLINIC_OR_DEPARTMENT_OTHER)
Admission: EM | Admit: 2011-07-29 | Discharge: 2011-07-30 | Disposition: A | Discharge status: Home / Self Care | Payer: Managed Care, Other (non HMO) | Attending: Emergency Medicine | Admitting: Emergency Medicine

## 2011-07-29 ENCOUNTER — Emergency Department (HOSPITAL_COMMUNITY)
Admission: EM | Admit: 2011-07-29 | Discharge: 2011-07-29 | Disposition: A | Discharge status: Home / Self Care | Payer: Managed Care, Other (non HMO) | Attending: Emergency Medicine | Admitting: Emergency Medicine

## 2011-07-29 ENCOUNTER — Encounter (HOSPITAL_BASED_OUTPATIENT_CLINIC_OR_DEPARTMENT_OTHER): Payer: Self-pay | Admitting: *Deleted

## 2011-07-29 ENCOUNTER — Encounter (HOSPITAL_COMMUNITY): Payer: Self-pay

## 2011-07-29 DIAGNOSIS — H6692 Otitis media, unspecified, left ear: Secondary | ICD-10-CM

## 2011-07-29 DIAGNOSIS — K219 Gastro-esophageal reflux disease without esophagitis: Secondary | ICD-10-CM | POA: Insufficient documentation

## 2011-07-29 DIAGNOSIS — F17200 Nicotine dependence, unspecified, uncomplicated: Secondary | ICD-10-CM | POA: Insufficient documentation

## 2011-07-29 DIAGNOSIS — H53149 Visual discomfort, unspecified: Secondary | ICD-10-CM | POA: Insufficient documentation

## 2011-07-29 DIAGNOSIS — R110 Nausea: Secondary | ICD-10-CM | POA: Insufficient documentation

## 2011-07-29 DIAGNOSIS — H9209 Otalgia, unspecified ear: Secondary | ICD-10-CM | POA: Insufficient documentation

## 2011-07-29 DIAGNOSIS — H6690 Otitis media, unspecified, unspecified ear: Secondary | ICD-10-CM | POA: Insufficient documentation

## 2011-07-29 DIAGNOSIS — Z79899 Other long term (current) drug therapy: Secondary | ICD-10-CM | POA: Insufficient documentation

## 2011-07-29 DIAGNOSIS — G43909 Migraine, unspecified, not intractable, without status migrainosus: Secondary | ICD-10-CM | POA: Insufficient documentation

## 2011-07-29 MED ORDER — DIPHENHYDRAMINE HCL 50 MG/ML IJ SOLN
25.0000 mg | Freq: Once | INTRAMUSCULAR | Status: DC
  Filled 2011-07-29: qty 1

## 2011-07-29 MED ORDER — KETOROLAC TROMETHAMINE 30 MG/ML IJ SOLN
30.0000 mg | Freq: Once | INTRAMUSCULAR | Status: AC
  Administered 2011-07-29: 30 mg via INTRAMUSCULAR
  Filled 2011-07-29: qty 1

## 2011-07-29 MED ORDER — DIPHENHYDRAMINE HCL 50 MG/ML IJ SOLN
25.0000 mg | Freq: Once | INTRAMUSCULAR | Status: AC
  Administered 2011-07-29: 07:00:00 via INTRAMUSCULAR

## 2011-07-29 MED ORDER — METOCLOPRAMIDE HCL 5 MG/ML IJ SOLN
10.0000 mg | Freq: Once | INTRAMUSCULAR | Status: AC
  Administered 2011-07-29: 10 mg via INTRAMUSCULAR
  Filled 2011-07-29: qty 2

## 2011-07-29 NOTE — ED Provider Notes (Signed)
History     CSN: [id number]  Arrival date & time 07/29/11  1308   First MD Initiated Contact with Patient 07/29/11 [phone]      Chief Complaint  Patient presents with  . Migraine    (Consider location/radiation/quality/duration/timing/severity/associated sxs/prior treatment) HPI Comments: Jenny Evans presents to the ED with 2 hour history of constant left sided migraine and ear pain. This migraine/ear pain is a chronic problem. Symptoms are same as typical symptoms. She takes imitrex IM for her migraines. This usually helps relieve her pain, but the injection did not help this morning. She has associated nausea, +photophobia, +phonophobia. Jenny Evans reports that she usually comes to the ED for pain and nausea medication when her imitrex does not stop her migraines. She does not request any particular medications.   Jenny Evans denies fever, vomiting, recent illness, and head trauma.   Patient is a 41 y.o. female presenting with migraine. The history is provided by the patient.  Migraine This is a new problem. The current episode started today. Associated symptoms include headaches and nausea. Pertinent negatives include no abdominal pain, chest pain, congestion, fever, neck pain, numbness, rash or vomiting.    Past Medical History  Diagnosis Date  . Migraine   . GERD (gastroesophageal reflux disease)   . Ulcer     Past Surgical History  Procedure Date  . Myringotomy     History reviewed. No pertinent family history.  History  Substance Use Topics  . Smoking status: Current Everyday Smoker  . Smokeless tobacco: Not on file  . Alcohol Use: No    OB History    Grav Para Term Preterm Abortions TAB SAB Ect Mult Living                  Review of Systems  Constitutional: Negative for fever.  HENT: Positive for ear pain. Negative for congestion, neck pain, neck stiffness and sinus pressure.   Eyes: Positive for photophobia. Negative for pain.  Respiratory: Negative  for shortness of breath.   Cardiovascular: Negative for chest pain.  Gastrointestinal: Positive for nausea. Negative for vomiting and abdominal pain.  Genitourinary: Negative for dysuria.  Skin: Negative for rash.  Neurological: Positive for headaches. Negative for numbness.    Allergies  Hydrocodone-acetaminophen  Home Medications   Current Outpatient Rx  Name Route Sig Dispense Refill  . ACETAMINOPHEN-CODEINE #3 300-30 MG PO TABS Oral Take 1-2 tablets by mouth every 6 (six) hours as needed for pain. 15 tablet 0  . AMITRIPTYLINE HCL 50 MG PO TABS Oral Take 100 mg by mouth at bedtime.      . BC HEADACHE POWDER PO Oral Take 1 packet by mouth once.      . ESOMEPRAZOLE MAGNESIUM 40 MG PO CPDR Oral Take 40 mg by mouth 2 (two) times daily.      . SUMATRIPTAN SUCCINATE 6 MG/0.5ML [place] SOLN Subcutaneous Inject 0.5 mLs (6 mg total) into the skin once. F 1 vial 0    BP 119/92  Pulse 88  Temp(Src) 97.9 F (36.6 C) (Oral)  Resp 20  SpO2 98%  LMP 07/18/2011  Physical Exam  Nursing note and vitals reviewed. Constitutional: She is oriented to person, place, and time. She appears well-developed and well-nourished.  HENT:  Head: Normocephalic and atraumatic.  Right Ear: Tympanic membrane, external ear and ear canal normal. No hemotympanum.  Left Ear: Tympanic membrane, external ear and ear canal normal. No hemotympanum.  Nose: Nose normal.  Mouth/Throat: Uvula is midline, oropharynx  is clear and moist and mucous membranes are normal.  Eyes: Conjunctivae, EOM and lids are normal. Pupils are equal, round, and reactive to light. Right eye exhibits no nystagmus. Left eye exhibits no nystagmus.  Neck: Normal range of motion. Neck supple.       No meningeal signs  Cardiovascular: Normal rate, regular rhythm and normal heart sounds.   Pulmonary/Chest: Effort normal and breath sounds normal.  Abdominal: Soft.  Musculoskeletal:       Cervical back: She exhibits normal range of motion, no  tenderness and no bony tenderness.       Thoracic back: She exhibits no tenderness and no bony tenderness.       Lumbar back: She exhibits no tenderness and no bony tenderness.  Neurological: She is alert and oriented to person, place, and time. She has normal strength and normal reflexes. No cranial nerve deficit or sensory deficit. Coordination normal. GCS eye subscore is 4. GCS verbal subscore is 5. GCS motor subscore is 6.  Skin: Skin is warm and dry. She is not diaphoretic.  Psychiatric: She has a normal mood and affect.    ED Course  Procedures (including critical care time)  Labs Reviewed - No data to display No results found.   1. Migraine     7:11 AM Patient seen and examined. Medications ordered. Will d/c to home.   Vital signs reviewed and are as follows: Filed Vitals:   07/29/11 0516  BP: 119/92  Pulse: 88  Temp: 97.9 F (36.6 C)  Resp: 20   Meds have been given. Pt has a ride home.   7:12 AM Patient urged to return with worsening symptoms, worsening severe HA, persistent vomiting, fever, or if she has any other concerns. Pt verbalizes understanding and agrees with plan.   MDM  Pt with migraine headache with typical symptoms. She has had multiple recent visits -- but she denies head injury, fever, neck stiffness. Normal neurological exam. Patient has neurology followup. Do not suspect intracranial bleed, meningitis, or other serious etiology given presentation.       Eustace Moore [place], [place] 07/29/11 [phone]

## 2011-07-29 NOTE — Discharge Instructions (Signed)
Please read and follow instructions below.    You were given toradol, reglan, and benadryl today for your headache.  Please follow-up with your family doctor or neurologist for management of your headaches.    Please return to the Emergency Department if you have dizziness, confusion, blurry vision, persistent vomiting, worsening headache, numbness, tingling, or weakness in arms or legs, or if you have any other concerns.

## 2011-07-29 NOTE — ED Notes (Signed)
Pt complains of a migraine for one hour

## 2011-07-29 NOTE — ED Notes (Signed)
Pt c/o left neck pain x 1 day

## 2011-07-29 NOTE — ED Provider Notes (Signed)
Medical screening examination/treatment/procedure(s) were performed by non-physician practitioner and as supervising physician I was immediately available for consultation/collaboration.  Jasmine Awe, MD 07/29/11 [phone]

## 2011-07-30 ENCOUNTER — Encounter (HOSPITAL_COMMUNITY): Payer: Self-pay | Admitting: *Deleted

## 2011-07-30 ENCOUNTER — Emergency Department (HOSPITAL_COMMUNITY)
Admission: EM | Admit: 2011-07-30 | Discharge: 2011-07-31 | Disposition: A | Discharge status: Home / Self Care | Payer: Managed Care, Other (non HMO) | Attending: Emergency Medicine | Admitting: Emergency Medicine

## 2011-07-30 DIAGNOSIS — R599 Enlarged lymph nodes, unspecified: Secondary | ICD-10-CM | POA: Insufficient documentation

## 2011-07-30 DIAGNOSIS — H9209 Otalgia, unspecified ear: Secondary | ICD-10-CM | POA: Insufficient documentation

## 2011-07-30 DIAGNOSIS — H9202 Otalgia, left ear: Secondary | ICD-10-CM

## 2011-07-30 DIAGNOSIS — K219 Gastro-esophageal reflux disease without esophagitis: Secondary | ICD-10-CM | POA: Insufficient documentation

## 2011-07-30 DIAGNOSIS — R51 Headache: Secondary | ICD-10-CM | POA: Insufficient documentation

## 2011-07-30 DIAGNOSIS — Z79899 Other long term (current) drug therapy: Secondary | ICD-10-CM | POA: Insufficient documentation

## 2011-07-30 DIAGNOSIS — M542 Cervicalgia: Secondary | ICD-10-CM | POA: Insufficient documentation

## 2011-07-30 DIAGNOSIS — R070 Pain in throat: Secondary | ICD-10-CM | POA: Insufficient documentation

## 2011-07-30 DIAGNOSIS — F17200 Nicotine dependence, unspecified, uncomplicated: Secondary | ICD-10-CM | POA: Insufficient documentation

## 2011-07-30 MED ORDER — IBUPROFEN 600 MG PO TABS: 600.0000 mg | ORAL_TABLET | Freq: Three times a day (TID) | ORAL | Status: AC | PRN

## 2011-07-30 MED ORDER — AMOXICILLIN 500 MG PO CAPS: 500.0000 mg | ORAL_CAPSULE | Freq: Three times a day (TID) | ORAL | Status: AC

## 2011-07-30 MED ORDER — ANTIPYRINE-BENZOCAINE 5.4-1.4 % OT SOLN
OTIC | Status: AC
  Administered 2011-07-30: 2 [drp] via OTIC
  Filled 2011-07-30: qty 10

## 2011-07-30 NOTE — Discharge Instructions (Signed)

## 2011-07-30 NOTE — ED Provider Notes (Signed)
History     CSN: [id number]  Arrival date & time 07/30/11  7829   First MD Initiated Contact with Patient 07/30/11 [phone]      Chief Complaint  Patient presents with  . Otalgia    (Consider location/radiation/quality/duration/timing/severity/associated sxs/prior treatment) Patient is a 41 y.o. female presenting with ear pain. The history is provided by the patient.  Otalgia This is a new problem. The current episode started more than 2 days ago. There is pain in the left ear. The problem occurs constantly. The problem has been gradually worsening. There has been no fever. The pain is at a severity of 9/10. The pain is moderate. Associated symptoms include headaches, sore throat and neck pain. Pertinent negatives include no ear discharge, no rhinorrhea, no abdominal pain and no rash.  Pt with left ear ache started several days ago. States was seen for this few times already in the last few days. Was seen at [facility] yesterday, had antibiotics and ibuprofen given for an ear infection. Pt states pain is worsening. Ibuprofen not helping. Pt states pain is moving down her left side of the neck. Denies fever, chills. States having a headache, sore throat, pain with swallowing. No other complaints.   Past Medical History  Diagnosis Date  . Migraine   . GERD (gastroesophageal reflux disease)   . Ulcer     Past Surgical History  Procedure Date  . Myringotomy     History reviewed. No pertinent family history.  History  Substance Use Topics  . Smoking status: Current Everyday Smoker  . Smokeless tobacco: Not on file  . Alcohol Use: No    OB History    Grav Para Term Preterm Abortions TAB SAB Ect Mult Living                  Review of Systems  Constitutional: Negative for fever and chills.  HENT: Positive for ear pain, sore throat and neck pain. Negative for rhinorrhea, neck stiffness and ear discharge.   Eyes: Negative.   Respiratory: Negative.   Cardiovascular: Negative.     Gastrointestinal: Negative.  Negative for abdominal pain.  Genitourinary: Negative.   Skin: Negative for rash.  Neurological: Positive for headaches.  Psychiatric/Behavioral: Negative.     Allergies  Hydrocodone-acetaminophen  Home Medications   Current Outpatient Rx  Name Route Sig Dispense Refill  . ACETAMINOPHEN-CODEINE #3 300-30 MG PO TABS Oral Take 1-2 tablets by mouth every 6 (six) hours as needed for pain. 15 tablet 0  . AMITRIPTYLINE HCL 50 MG PO TABS Oral Take 100 mg by mouth at bedtime.      . AMOXICILLIN 500 MG PO CAPS Oral Take 1 capsule (500 mg total) by mouth 3 (three) times daily. 21 capsule 0  . BC HEADACHE POWDER PO Oral Take 1 packet by mouth as needed. For headache and pain    . ESOMEPRAZOLE MAGNESIUM 40 MG PO CPDR Oral Take 40 mg by mouth 2 (two) times daily.      . IBUPROFEN 600 MG PO TABS Oral Take 1 tablet (600 mg total) by mouth every 8 (eight) hours as needed for pain. 15 tablet 0  . SUMATRIPTAN SUCCINATE 6 MG/0.5ML [place] SOLN Subcutaneous Inject 6 mg into the skin every 2 (two) hours as needed. For migraine      BP 143/119  Pulse 83  Temp(Src) 97.5 F (36.4 C) (Oral)  Resp 16  SpO2 97%  LMP 07/15/2011  Physical Exam  Nursing note and vitals reviewed. Constitutional: She  is oriented to person, place, and time. She appears well-developed and well-nourished.  HENT:  Head: Normocephalic.  Right Ear: Tympanic membrane, external ear and ear canal normal.  Left Ear: External ear and ear canal normal. Tympanic membrane is injected.  Nose: Nose normal. No mucosal edema or rhinorrhea.  Mouth/Throat: Uvula is midline, oropharynx is clear and moist and mucous membranes are normal.  Eyes: Conjunctivae are normal.  Neck: Neck supple.       Submandibular and anterior cervical lymphadenopathy on left, tender to palpation  Cardiovascular: Normal rate and regular rhythm.   Pulmonary/Chest: Effort normal and breath sounds normal. No respiratory distress.  Abdominal:  Soft. Bowel sounds are normal. She exhibits no distension.  Neurological: She is alert and oriented to person, place, and time.  Skin: Skin is warm and dry.  Psychiatric: She has a normal mood and affect.    ED Course  Procedures (including critical care time)  This is pt's 3rd visit for the same. Ear slightly injected, however, does not look particulary toxic. No mastoid tenderness. Pain mainly over subnamdibular and down the anterior neck. Pt afebrile, non toxic. I have put in CT soft tissue neck to rule out an abscess after discussing the case with Dr. Judd Lien. Pt however, refusing CT, stated "its not that bad." Asking to be referred to ENT. Pt wanting to be discharged. I will d/c pt, will refer to ENT, however, explained if it gets worse, if she runs fevers, unable to follow up, to return to ED>  No diagnosis found.    MDM          Lottie Mussel, PA 07/30/11 [phone]

## 2011-07-30 NOTE — ED Provider Notes (Signed)
History     CSN: [id number]  Arrival date & time 07/29/11  2208   First MD Initiated Contact with Patient 07/29/11 2339      Chief Complaint  Patient presents with  . Neck Pain    (Consider location/radiation/quality/duration/timing/severity/associated sxs/prior treatment) Patient is a 41 y.o. female presenting with neck pain. The history is provided by the patient.  Neck Pain    patient presents with several days of left-sided ear pain.  Now she does develop mild left neck pain.  She denies sore throat.  She denies fevers and chills.  She reports her ears bother her for approximately one week.  She denies nausea and vomiting.  She denies headache at this time.  Nothing worsens her symptoms.  Nothing improves her symptoms.  Her symptoms are constant.  Her pain is mild in severity the  Past Medical History  Diagnosis Date  . Migraine   . GERD (gastroesophageal reflux disease)   . Ulcer     Past Surgical History  Procedure Date  . Myringotomy     History reviewed. No pertinent family history.  History  Substance Use Topics  . Smoking status: Current Everyday Smoker  . Smokeless tobacco: Not on file  . Alcohol Use: No    OB History    Grav Para Term Preterm Abortions TAB SAB Ect Mult Living                  Review of Systems  HENT: Positive for neck pain.   All other systems reviewed and are negative.    Allergies  Hydrocodone-acetaminophen  Home Medications   Current Outpatient Rx  Name Route Sig Dispense Refill  . ACETAMINOPHEN-CODEINE #3 300-30 MG PO TABS Oral Take 1-2 tablets by mouth every 6 (six) hours as needed for pain. 15 tablet 0  . AMITRIPTYLINE HCL 50 MG PO TABS Oral Take 100 mg by mouth at bedtime.      . BC HEADACHE POWDER PO Oral Take 1 packet by mouth as needed. For headache and pain    . ESOMEPRAZOLE MAGNESIUM 40 MG PO CPDR Oral Take 40 mg by mouth 2 (two) times daily.      . SUMATRIPTAN SUCCINATE 6 MG/0.5ML [place] SOLN Subcutaneous Inject 6  mg into the skin every 2 (two) hours as needed. For migraine    . AMOXICILLIN 500 MG PO CAPS Oral Take 1 capsule (500 mg total) by mouth 3 (three) times daily. 21 capsule 0  . IBUPROFEN 600 MG PO TABS Oral Take 1 tablet (600 mg total) by mouth every 8 (eight) hours as needed for pain. 15 tablet 0    BP 115/86  Pulse 71  Temp 97.7 F (36.5 C)  Resp 18  Ht 5\' 5"  (1.651 m)  Wt 157 lb (71.215 kg)  BMI 26.13 kg/m2  SpO2 99%  LMP 07/15/2011  Physical Exam  Nursing note and vitals reviewed. Constitutional: She is oriented to person, place, and time. She appears well-developed and well-nourished. No distress.  HENT:  Head: Normocephalic and atraumatic.  Right Ear: Hearing, external ear and ear canal normal. Tympanic membrane is not perforated, not erythematous and not bulging.  Left Ear: Hearing, external ear and ear canal normal. Tympanic membrane is erythematous and bulging. Tympanic membrane is not perforated.  Mouth/Throat: Uvula is midline, oropharynx is clear and moist and mucous membranes are normal. No dental caries.  Eyes: EOM are normal.  Neck: Trachea normal and normal range of motion. No spinous process tenderness and  no muscular tenderness present. No rigidity. No Brudzinski's sign and no Kernig's sign noted. No mass present.       No obvious lymphadenopathy noted  Cardiovascular: Normal rate, regular rhythm and normal heart sounds.   Pulmonary/Chest: Effort normal and breath sounds normal.  Abdominal: Soft. She exhibits no distension. There is no tenderness.  Musculoskeletal: Normal range of motion.  Neurological: She is alert and oriented to person, place, and time.  Skin: Skin is warm and dry.  Psychiatric: She has a normal mood and affect. Judgment normal.    ED Course  Procedures (including critical care time)  Labs Reviewed - No data to display No results found.   1. Otitis media, left       MDM  I suspect this is left otitis media given a small effusion in  her left ear.  She be treated with amoxicillin.  She we have and ibuprofen for pain.  DC home in good condition.        Lyanne Co, MD 07/30/11 [phone]

## 2011-07-30 NOTE — ED Notes (Signed)
Patient presents with c/o left ear pain.  Has been seen by PMD and started on Amoxicillin 1 day ago but states she has been taking Ibuprofen without relief.

## 2011-07-30 NOTE — ED Notes (Signed)
Pt reports being seen at [facility] today and diagnosised with a (L) ear infection.  Reports that the medication is not helping her pain.  No distress noted.

## 2011-07-30 NOTE — Discharge Instructions (Signed)
Continue ibuprofen and tylenol #3. Apply auralgan ear drops, 4 drops in left ear, every 2 hrs as needed. Follow up with primary care doctor or ENT as referred. Return if worsening.  Otalgia The most common reason for this in children is an infection of the middle ear. Pain from the middle ear is usually caused by a build-up of fluid and pressure behind the eardrum. Pain from an earache can be sharp, dull, or burning. The pain may be temporary or constant. The middle ear is connected to the nasal passages by a short narrow tube called the Eustachian tube. The Eustachian tube allows fluid to drain out of the middle ear, and helps keep the pressure in your ear equalized. CAUSES  A cold or allergy can block the Eustachian tube with inflammation and the build-up of secretions. This is especially likely in small children, because their Eustachian tube is shorter and more horizontal. When the Eustachian tube closes, the normal flow of fluid from the middle ear is stopped. Fluid can accumulate and cause stuffiness, pain, hearing loss, and an ear infection if germs start growing in this area. SYMPTOMS  The symptoms of an ear infection may include fever, ear pain, fussiness, increased crying, and irritability. Many children will have temporary and minor hearing loss during and right after an ear infection. Permanent hearing loss is rare, but the risk increases the more infections a child has. Other causes of ear pain include retained water in the outer ear canal from swimming and bathing. Ear pain in adults is less likely to be from an ear infection. Ear pain may be referred from other locations. Referred pain may be from the joint between your jaw and the skull. It may also come from a tooth problem or problems in the neck. Other causes of ear pain include:  A foreign body in the ear.   Outer ear infection.   Sinus infections.   Impacted ear wax.   Ear injury.   Arthritis of the jaw or TMJ problems.    Middle ear infection.   Tooth infections.   Sore throat with pain to the ears.  DIAGNOSIS  Your caregiver can usually make the diagnosis by examining you. Sometimes other special studies, including x-rays and lab work may be necessary. TREATMENT   If antibiotics were prescribed, use them as directed and finish them even if you or your child's symptoms seem to be improved.   Sometimes PE tubes are needed in children. These are little plastic tubes which are put into the eardrum during a simple surgical procedure. They allow fluid to drain easier and allow the pressure in the middle ear to equalize. This helps relieve the ear pain caused by pressure changes.  HOME CARE INSTRUCTIONS   Only take over-the-counter or prescription medicines for pain, discomfort, or fever as directed by your caregiver. DO NOT GIVE CHILDREN ASPIRIN because of the association of Reye's Syndrome in children taking aspirin.   Use a cold pack applied to the outer ear for 15 to 20 minutes, 3 to 4 times per day or as needed may reduce pain. Do not apply ice directly to the skin. You may cause frost bite.   Over-the-counter ear drops used as directed may be effective. Your caregiver may sometimes prescribe ear drops.   Resting in an upright position may help reduce pressure in the middle ear and relieve pain.   Ear pain caused by rapidly descending from high altitudes can be relieved by swallowing or chewing gum.  Allowing infants to suck on a bottle during airplane travel can help.   Do not smoke in the house or near children. If you are unable to quit smoking, smoke outside.   Control allergies.  SEEK IMMEDIATE MEDICAL CARE IF:   You or your child are becoming sicker.   Pain or fever relief is not obtained with medicine.   You or your child's symptoms (pain, fever, or irritability) do not improve within 24 to 48 hours or as instructed.   Severe pain suddenly stops hurting. This may indicate a ruptured  eardrum.   You or your children develop new problems such as severe headaches, stiff neck, difficulty swallowing, or swelling of the face or around the ear.  Document Released: 01/18/2004 Document Revised: 02/12/2011 Document Reviewed: 05/24/2008 [facility] Patient Information 2012 [place], [place].

## 2011-07-31 NOTE — ED Provider Notes (Signed)
Medical screening examination/treatment/procedure(s) were performed by non-physician practitioner and as supervising physician I was immediately available for consultation/collaboration.  Geoffery Lyons, MD 07/31/11 [phone]

## 2011-08-09 ENCOUNTER — Encounter (HOSPITAL_COMMUNITY): Payer: Self-pay | Admitting: *Deleted

## 2011-08-09 ENCOUNTER — Emergency Department (HOSPITAL_COMMUNITY): Payer: Managed Care, Other (non HMO)

## 2011-08-09 ENCOUNTER — Emergency Department (HOSPITAL_COMMUNITY)
Admission: EM | Admit: 2011-08-09 | Discharge: 2011-08-09 | Disposition: A | Discharge status: Home Health | Payer: Managed Care, Other (non HMO) | Attending: Emergency Medicine | Admitting: Emergency Medicine

## 2011-08-09 DIAGNOSIS — F17200 Nicotine dependence, unspecified, uncomplicated: Secondary | ICD-10-CM | POA: Insufficient documentation

## 2011-08-09 DIAGNOSIS — H9209 Otalgia, unspecified ear: Secondary | ICD-10-CM | POA: Insufficient documentation

## 2011-08-09 DIAGNOSIS — Z79899 Other long term (current) drug therapy: Secondary | ICD-10-CM | POA: Insufficient documentation

## 2011-08-09 DIAGNOSIS — K219 Gastro-esophageal reflux disease without esophagitis: Secondary | ICD-10-CM | POA: Insufficient documentation

## 2011-08-09 DIAGNOSIS — R51 Headache: Secondary | ICD-10-CM | POA: Insufficient documentation

## 2011-08-09 MED ORDER — IOHEXOL 300 MG/ML  SOLN
100.0000 mL | Freq: Once | INTRAMUSCULAR | Status: AC | PRN
  Administered 2011-08-09: 100 mL via INTRAVENOUS

## 2011-08-09 MED ORDER — MORPHINE SULFATE 4 MG/ML IJ SOLN
4.0000 mg | Freq: Once | INTRAMUSCULAR | Status: AC
  Administered 2011-08-09: 4 mg via INTRAVENOUS
  Filled 2011-08-09: qty 1

## 2011-08-09 MED ORDER — TRAMADOL HCL 50 MG PO TABS: 50.0000 mg | ORAL_TABLET | Freq: Four times a day (QID) | ORAL | Status: AC | PRN

## 2011-08-09 MED ORDER — ONDANSETRON HCL 4 MG/2ML IJ SOLN
4.0000 mg | Freq: Once | INTRAMUSCULAR | Status: AC
  Administered 2011-08-09: 4 mg via INTRAVENOUS
  Filled 2011-08-09: qty 2

## 2011-08-09 NOTE — ED Notes (Signed)
Pt states that she is experiencing head pain and left sided jaw pain that she associates with swelling on the left side of her face.

## 2011-08-09 NOTE — Discharge Instructions (Signed)

## 2011-08-09 NOTE — ED Provider Notes (Signed)
History     CSN: [id number]  Arrival date & time 08/09/11  0348   First MD Initiated Contact with Patient 08/09/11 0354      Chief Complaint  Patient presents with  . Headache  . Otalgia    (Consider location/radiation/quality/duration/timing/severity/associated sxs/prior treatment) HPI Comments: Patient presents to the emergency department complaints left ear pain and swelling to the left side of the neck. She's actually been seen 5 times in February for left ear pain. She was recently started on amoxicillin for an otitis media. She was seen here a few days ago and was complaining of some swelling to left side of her submandibular area and was recommended to have CT soft tissue neck but at that point she refused. She came back today to get a CT scan and she says her pain is not getting any better. She describes the pain mostly to the left upper neck area in the submandibular region just inferior to the left ear. She also feels like this area swollen. She has some pain to her ear itself but it's mostly just under the ear or in the submandibular region. She also has a headache which is consistent with her past migraine-type headaches. She states she's had recurrent pain in her left ear for several years and recurrent infections in the left ear. She denies that the ear pain is related to her migraines. Denies any fevers she's had some nausea but no vomiting. Denies any numbness or weakness in her extremities denies any dizziness or balance problems. Denies any hearing deficits.  Patient is a 41 y.o. female presenting with headaches and ear pain. The history is provided by the patient.  Headache  Pertinent negatives include no fever, no shortness of breath, no nausea and no vomiting.  Otalgia Associated symptoms include headaches. Pertinent negatives include no rhinorrhea, no abdominal pain, no diarrhea, no vomiting, no cough and no rash.    Past Medical History  Diagnosis Date  . Migraine     . GERD (gastroesophageal reflux disease)   . Ulcer     Past Surgical History  Procedure Date  . Myringotomy     No family history on file.  History  Substance Use Topics  . Smoking status: Current Everyday Smoker  . Smokeless tobacco: Not on file  . Alcohol Use: No    OB History    Grav Para Term Preterm Abortions TAB SAB Ect Mult Living                  Review of Systems  Constitutional: Negative for fever, chills, diaphoresis and fatigue.  HENT: Positive for ear pain. Negative for congestion, rhinorrhea and sneezing.   Eyes: Negative.   Respiratory: Negative for cough, chest tightness and shortness of breath.   Cardiovascular: Negative for chest pain and leg swelling.  Gastrointestinal: Negative for nausea, vomiting, abdominal pain, diarrhea and blood in stool.  Genitourinary: Negative for frequency, hematuria, flank pain and difficulty urinating.  Musculoskeletal: Negative for back pain and arthralgias.  Skin: Negative for rash.  Neurological: Positive for headaches. Negative for dizziness, speech difficulty, weakness and numbness.    Allergies  Hydrocodone-acetaminophen  Home Medications   Current Outpatient Rx  Name Route Sig Dispense Refill  . AMITRIPTYLINE HCL 50 MG PO TABS Oral Take 100 mg by mouth at bedtime.      . AMOXICILLIN 500 MG PO CAPS Oral Take 1 capsule (500 mg total) by mouth 3 (three) times daily. 21 capsule 0  . BC  HEADACHE POWDER PO Oral Take 1 packet by mouth as needed. For headache and pain    . ESOMEPRAZOLE MAGNESIUM 40 MG PO CPDR Oral Take 40 mg by mouth 2 (two) times daily.      . IBUPROFEN 600 MG PO TABS Oral Take 1 tablet (600 mg total) by mouth every 8 (eight) hours as needed for pain. 15 tablet 0  . SUMATRIPTAN SUCCINATE 6 MG/0.5ML [place] SOLN Subcutaneous Inject 6 mg into the skin every 2 (two) hours as needed. For migraine    . TRAMADOL HCL 50 MG PO TABS Oral Take 1 tablet (50 mg total) by mouth every 6 (six) hours as needed for pain.  15 tablet 0    BP 133/87  Pulse 107  Temp(Src) 98.3 F (36.8 C) (Oral)  Resp 18  SpO2 97%  LMP 07/15/2011  Physical Exam  Constitutional: She is oriented to person, place, and time. She appears well-developed and well-nourished.  HENT:  Head: Normocephalic and atraumatic.       Both ears appear normal with some questionable clear fluid behind the left ear but does not appear to be infected. There is no elevation of the peanut. There is no pain over the mastoid. There is some tenderness to the left anterior neck area in the submandibular region. There some mild lymphadenopathy and fullness to this area. There is no pain or stiffness to the posterior neck. No trismus is noted.  Eyes: Pupils are equal, round, and reactive to light.  Neck: Normal range of motion. Neck supple.  Cardiovascular: Normal rate, regular rhythm and normal heart sounds.   Pulmonary/Chest: Effort normal and breath sounds normal. No respiratory distress. She has no wheezes. She has no rales. She exhibits no tenderness.  Abdominal: Soft. Bowel sounds are normal. There is no tenderness. There is no rebound and no guarding.  Musculoskeletal: Normal range of motion. She exhibits no edema.  Lymphadenopathy:    She has no cervical adenopathy.  Neurological: She is alert and oriented to person, place, and time.  Skin: Skin is warm and dry. No rash noted.  Psychiatric: She has a normal mood and affect.    ED Course  Procedures (including critical care time)  Results for orders placed during the hospital encounter of 07/25/11  POCT RAPID STREP A ([facility]ARE ONLY)      Component Value Range   Streptococcus, Group A Screen (Direct) NEGATIVE  NEGATIVE    Ct Soft Tissue Neck W Contrast  08/09/2011  *RADIOLOGY REPORT*  Clinical Data: Left jaw pain, left facial swelling.  CT NECK WITH CONTRAST  Technique:  Multidetector CT imaging of the neck was performed with intravenous contrast.  Contrast: OMNIPAQUE IOHEXOL 300  MG/ML IV SOLN  Comparison: 01/12/2006  Findings: Soft tissues are unremarkable.  No evidence of abscess. No acute bony abnormality.  No evidence of peri apical abscess. Airway is patent.  Orbital soft tissues are unremarkable. Paranasal sinuses are clear.  Lung apices clear.  IMPRESSION: No acute findings.  Original Report Authenticated By: Cyndie Chime, M.D.       1. Otalgia       MDM  Pt with left side ear/neck pain.  Unclear etiology.  I don't see any evidence of OM, although pt is currently on antiobiotics.  Nothing to suggest mastoiditis.  No evidence of soft tissue abscess.  Could be a neuralgia, although pt does not have pain to TMJ.  Will refer pt back to see her neurologist in [facility].  Give small rx for pain meds        Rolan Bucco, MD 08/09/11 [phone]

## 2011-09-04 ENCOUNTER — Encounter (HOSPITAL_BASED_OUTPATIENT_CLINIC_OR_DEPARTMENT_OTHER): Payer: Self-pay | Admitting: *Deleted

## 2011-09-04 ENCOUNTER — Emergency Department (HOSPITAL_BASED_OUTPATIENT_CLINIC_OR_DEPARTMENT_OTHER)
Admission: EM | Admit: 2011-09-04 | Discharge: 2011-09-04 | Disposition: A | Discharge status: Home / Self Care | Payer: Managed Care, Other (non HMO) | Attending: Emergency Medicine | Admitting: Emergency Medicine

## 2011-09-04 DIAGNOSIS — R112 Nausea with vomiting, unspecified: Secondary | ICD-10-CM | POA: Insufficient documentation

## 2011-09-04 DIAGNOSIS — IMO0001 Reserved for inherently not codable concepts without codable children: Secondary | ICD-10-CM | POA: Insufficient documentation

## 2011-09-04 DIAGNOSIS — R51 Headache: Secondary | ICD-10-CM

## 2011-09-04 LAB — BASIC METABOLIC PANEL
BUN: 5 mg/dL — ABNORMAL LOW (ref 6–23)
CO2: 26 mEq/L (ref 19–32)
Calcium: 9.5 mg/dL (ref 8.4–10.5)
Creatinine, Ser: 0.5 mg/dL (ref 0.50–1.10)
Glucose, Bld: 119 mg/dL — ABNORMAL HIGH (ref 70–99)

## 2011-09-04 LAB — CBC
HCT: 30.9 % — ABNORMAL LOW (ref 36.0–46.0)
MCH: 28.1 pg (ref 26.0–34.0)
MCV: 78.8 fL (ref 78.0–100.0)
Platelets: 360 10*3/uL (ref 150–400)
RBC: 3.92 MIL/uL (ref 3.87–5.11)

## 2011-09-04 LAB — URINALYSIS, MICROSCOPIC ONLY
Bilirubin Urine: NEGATIVE
Nitrite: NEGATIVE
Protein, ur: NEGATIVE mg/dL
Specific Gravity, Urine: 1.013 (ref 1.005–1.030)
Urobilinogen, UA: 1 mg/dL (ref 0.0–1.0)

## 2011-09-04 MED ORDER — SODIUM CHLORIDE 0.9 % IV BOLUS (SEPSIS)
1000.0000 mL | Freq: Once | INTRAVENOUS | Status: AC
  Administered 2011-09-04: 1000 mL via INTRAVENOUS

## 2011-09-04 MED ORDER — IBUPROFEN 800 MG PO TABS: 800.0000 mg | ORAL_TABLET | Freq: Three times a day (TID) | ORAL | Status: DC

## 2011-09-04 MED ORDER — POTASSIUM CHLORIDE CRYS ER 20 MEQ PO TBCR
20.0000 meq | EXTENDED_RELEASE_TABLET | Freq: Once | ORAL | Status: AC
  Administered 2011-09-04: 20 meq via ORAL
  Filled 2011-09-04: qty 1

## 2011-09-04 MED ORDER — KETOROLAC TROMETHAMINE 30 MG/ML IJ SOLN
30.0000 mg | Freq: Once | INTRAMUSCULAR | Status: AC
  Administered 2011-09-04: 30 mg via INTRAVENOUS
  Filled 2011-09-04: qty 1

## 2011-09-04 MED ORDER — ONDANSETRON HCL 4 MG PO TABS: 4.0000 mg | ORAL_TABLET | Freq: Four times a day (QID) | ORAL | Status: AC

## 2011-09-04 NOTE — ED Provider Notes (Signed)
History     CSN: [id number]  Arrival date & time 09/04/11  0107   First MD Initiated Contact with Patient 09/04/11 0224      Chief Complaint  Patient presents with  . Generalized Body Aches  . Headache    (Consider location/radiation/quality/duration/timing/severity/associated sxs/prior treatment) Patient is a 41 y.o. female presenting with headaches. The history is provided by the patient.  Headache  This is a new problem. The current episode started 6 to 12 hours ago. The problem occurs constantly. The problem has not changed since onset.The headache is associated with nothing. The pain is located in the bilateral region. The quality of the pain is described as throbbing. The pain is moderate. The pain does not radiate. Associated symptoms include nausea and vomiting. Pertinent negatives include no fever and no shortness of breath. She has tried nothing for the symptoms. The treatment provided no relief.   onset around 2 PM this afternoon start her with mild headache which progressively got worse and feels like her typical migraine. She then developed nausea and vomiting and became concerned she may be getting the same viral infection that her son had last week, which was nausea and vomiting with low-grade fevers. No associated abdominal pain, chest pain or difficulty breathing. No weakness or numbness. No sick contacts otherwise. No rash or recent travel. No neck pain or neck stiffness. No difficulty walking or speaking.  Past Medical History  Diagnosis Date  . Migraine   . GERD (gastroesophageal reflux disease)   . Ulcer     Past Surgical History  Procedure Date  . Myringotomy     History reviewed. No pertinent family history.  History  Substance Use Topics  . Smoking status: Current Everyday Smoker  . Smokeless tobacco: Not on file  . Alcohol Use: No    OB History    Grav Para Term Preterm Abortions TAB SAB Ect Mult Living                  Review of Systems    Constitutional: Negative for fever and chills.  HENT: Negative for neck pain and neck stiffness.   Eyes: Negative for pain.  Respiratory: Negative for shortness of breath.   Cardiovascular: Negative for chest pain.  Gastrointestinal: Positive for nausea and vomiting. Negative for abdominal pain.  Genitourinary: Negative for dysuria.  Musculoskeletal: Negative for back pain.  Skin: Negative for rash.  Neurological: Positive for headaches.  All other systems reviewed and are negative.    Allergies  Hydrocodone-acetaminophen  Home Medications   Current Outpatient Rx  Name Route Sig Dispense Refill  . AMITRIPTYLINE HCL 50 MG PO TABS Oral Take 100 mg by mouth at bedtime.      . BC HEADACHE POWDER PO Oral Take 1 packet by mouth as needed. For headache and pain    . ESOMEPRAZOLE MAGNESIUM 40 MG PO CPDR Oral Take 40 mg by mouth 2 (two) times daily.      . SUMATRIPTAN SUCCINATE 6 MG/0.5ML [place] SOLN Subcutaneous Inject 6 mg into the skin every 2 (two) hours as needed. For migraine      BP 101/69  Pulse 56  Temp(Src) 98.6 F (37 C) (Oral)  Resp 16  Ht 5\' 5"  (1.651 m)  Wt 155 lb (70.308 kg)  BMI 25.79 kg/m2  SpO2 98%  Physical Exam  Constitutional: She is oriented to person, place, and time. She appears well-developed and well-nourished.  HENT:  Head: Normocephalic and atraumatic.  Eyes: Conjunctivae and EOM  are normal. Pupils are equal, round, and reactive to light.  Neck: Trachea normal and full passive range of motion without pain. Neck supple. No thyromegaly present.       No meningismus  Cardiovascular: Normal rate, regular rhythm, S1 normal, S2 normal, intact distal pulses and normal pulses.     No systolic murmur is present   No diastolic murmur is present  Pulses:      Radial pulses are 2+ on the right side, and 2+ on the left side.  Pulmonary/Chest: Effort normal and breath sounds normal. She has no wheezes. She has no rhonchi. She has no rales. She exhibits no  tenderness.  Abdominal: Soft. Normal appearance and bowel sounds are normal. There is no tenderness. There is no CVA tenderness and negative Murphy's sign.  Musculoskeletal: Normal range of motion.       BLE:s Calves nontender, no cords or erythema, negative Homans sign  Neurological: She is alert and oriented to person, place, and time. She has normal strength and normal reflexes. No cranial nerve deficit or sensory deficit. She displays a negative Romberg sign. GCS eye subscore is 4. GCS verbal subscore is 5. GCS motor subscore is 6.       Normal Gait  Skin: Skin is warm and dry. No rash noted. She is not diaphoretic. No cyanosis. Nails show no clubbing.  Psychiatric: She has a normal mood and affect. Her speech is normal and behavior is normal.       Cooperative and appropriate    ED Course  Procedures (including critical care time)  Labs Reviewed  URINALYSIS, WITH MICROSCOPIC - Abnormal; Notable for the following:    Hgb urine dipstick MODERATE (*)    Squamous Epithelial / LPF FEW (*)    All other components within normal limits  CBC - Abnormal; Notable for the following:    Hemoglobin 11.0 (*)    HCT 30.9 (*)    RDW 16.4 (*)    All other components within normal limits  BASIC METABOLIC PANEL - Abnormal; Notable for the following:    Potassium 3.4 (*)    Glucose, Bld 119 (*)    BUN 5 (*)    All other components within normal limits  PREGNANCY, URINE   IV fluids. IV Toradol. Labs and UA obtained and reviewed as above. Potassium provided for mild hypokalemia. Recheck at 4 AM is feeling better and requesting to be discharged home.  MDM   Headache with nausea vomiting and history of migraines. No neuro deficits or indication for emergent CT scan of brain. Improved with medications as above. No abnormal tenderness or peritonitis serial exams. No indication for further workup at this time. Given improving condition is stable for discharge home and outpatient followup as needed  reliable historian verbalizes understanding headache and vomiting precautions.Sunnie Nielsen, MD 09/04/11 [phone]

## 2011-09-04 NOTE — Discharge Instructions (Signed)
Headache The specific cause of your headache may not have been found today. There are many causes and types of headache. A few common ones are:  Tension headache.  Migraine.  Infections (examples: dental and sinus infections).  Bone and/or joint problems in the neck or jaw.  Depression.  Eye problems.  These headaches are not life threatening.  Headaches can sometimes be diagnosed by a patient history and a physical exam. Sometimes, lab and imaging studies (such as x-ray and/or CT scan) are used to rule out more serious problems. In some cases, a spinal tap (lumbar puncture) may be requested. There are many times when your exam and tests may be normal on the first visit even when there is a serious problem causing your headaches. Because of that, it is very important to follow up with your doctor or local clinic for further evaluation.  FINDING OUT THE RESULTS OF TESTS  If a radiology test was performed, a radiologist will review your results.  You will be contacted by the emergency department or your physician if any test results require a change in your treatment plan.  Not all test results may be available during your visit. If your test results are not back during the visit, make an appointment with your caregiver to find out the results. Do not assume everything is normal if you have not heard from your caregiver or the medical facility. It is important for you to follow up on all of your test results.  HOME CARE INSTRUCTIONS  Keep follow-up appointments with your caregiver, or any specialist referral.  Only take over-the-counter or prescription medicines for pain, discomfort, or fever as directed by your caregiver.  Biofeedback, massage, or other relaxation techniques may be helpful.  Ice packs or heat applied to the head and neck can be used. Do this three to four times per day, or as needed.  Call your doctor if you have any questions or concerns.  If you smoke, you should quit.  SEEK  MEDICAL CARE IF:  You develop problems with medications prescribed.  You do not respond to or obtain relief from medications.  You have a change from the usual headache.  You develop nausea or vomiting.  SEEK IMMEDIATE MEDICAL CARE IF:  If your headache becomes severe.  You have an unexplained oral temperature above 102 F (38.9 C), or as your caregiver suggests.  You have a stiff neck.  You have loss of vision.  You have muscular weakness.  You have loss of muscular control.  You develop severe symptoms different from your first symptoms.  You start losing your balance or have trouble walking.  You feel faint or pass out.  MAKE SURE YOU:  Understand these instructions.  Will watch your condition.  Will get help right away if you are not doing well or get worse. 

## 2011-09-04 NOTE — ED Notes (Signed)
C/o headache, body aches, nausea, and weakness since this afternoon

## 2011-09-07 ENCOUNTER — Emergency Department (HOSPITAL_COMMUNITY)
Admission: EM | Admit: 2011-09-07 | Discharge: 2011-09-08 | Disposition: A | Discharge status: Home / Self Care | Payer: Managed Care, Other (non HMO) | Attending: Emergency Medicine | Admitting: Emergency Medicine

## 2011-09-07 ENCOUNTER — Encounter (HOSPITAL_COMMUNITY): Payer: Self-pay | Admitting: Emergency Medicine

## 2011-09-07 DIAGNOSIS — F17200 Nicotine dependence, unspecified, uncomplicated: Secondary | ICD-10-CM | POA: Insufficient documentation

## 2011-09-07 DIAGNOSIS — K089 Disorder of teeth and supporting structures, unspecified: Secondary | ICD-10-CM | POA: Insufficient documentation

## 2011-09-07 DIAGNOSIS — K219 Gastro-esophageal reflux disease without esophagitis: Secondary | ICD-10-CM | POA: Insufficient documentation

## 2011-09-07 DIAGNOSIS — G43909 Migraine, unspecified, not intractable, without status migrainosus: Secondary | ICD-10-CM | POA: Insufficient documentation

## 2011-09-07 DIAGNOSIS — K0889 Other specified disorders of teeth and supporting structures: Secondary | ICD-10-CM

## 2011-09-07 DIAGNOSIS — Z79899 Other long term (current) drug therapy: Secondary | ICD-10-CM | POA: Insufficient documentation

## 2011-09-07 NOTE — ED Notes (Signed)
C/o pain to lower left wisdom tooth since yesterday.  States has appt to have it pulled in 2 weeks.

## 2011-09-08 ENCOUNTER — Encounter (HOSPITAL_COMMUNITY): Payer: Self-pay | Admitting: Emergency Medicine

## 2011-09-08 ENCOUNTER — Emergency Department (HOSPITAL_COMMUNITY)
Admission: EM | Admit: 2011-09-08 | Discharge: 2011-09-09 | Disposition: A | Discharge status: Home / Self Care | Payer: Managed Care, Other (non HMO) | Attending: Emergency Medicine | Admitting: Emergency Medicine

## 2011-09-08 DIAGNOSIS — K0889 Other specified disorders of teeth and supporting structures: Secondary | ICD-10-CM

## 2011-09-08 DIAGNOSIS — Z79899 Other long term (current) drug therapy: Secondary | ICD-10-CM | POA: Insufficient documentation

## 2011-09-08 DIAGNOSIS — G43909 Migraine, unspecified, not intractable, without status migrainosus: Secondary | ICD-10-CM | POA: Insufficient documentation

## 2011-09-08 DIAGNOSIS — K029 Dental caries, unspecified: Secondary | ICD-10-CM | POA: Insufficient documentation

## 2011-09-08 DIAGNOSIS — F17200 Nicotine dependence, unspecified, uncomplicated: Secondary | ICD-10-CM | POA: Insufficient documentation

## 2011-09-08 DIAGNOSIS — K219 Gastro-esophageal reflux disease without esophagitis: Secondary | ICD-10-CM | POA: Insufficient documentation

## 2011-09-08 MED ORDER — KETOROLAC TROMETHAMINE 30 MG/ML IJ SOLN
30.0000 mg | Freq: Once | INTRAMUSCULAR | Status: AC
  Administered 2011-09-08: 30 mg via INTRAMUSCULAR
  Filled 2011-09-08: qty 1

## 2011-09-08 MED ORDER — OXYCODONE-ACETAMINOPHEN 5-325 MG PO TABS
2.0000 | ORAL_TABLET | Freq: Once | ORAL | Status: AC
  Administered 2011-09-08: 2 via ORAL
  Filled 2011-09-08: qty 2

## 2011-09-08 MED ORDER — PENICILLIN V POTASSIUM 500 MG PO TABS: 500.0000 mg | ORAL_TABLET | Freq: Four times a day (QID) | ORAL | Status: AC

## 2011-09-08 MED ORDER — ONDANSETRON 4 MG PO TBDP
4.0000 mg | ORAL_TABLET | Freq: Once | ORAL | Status: AC
  Administered 2011-09-08: 4 mg via ORAL
  Filled 2011-09-08: qty 1

## 2011-09-08 MED ORDER — OXYCODONE-ACETAMINOPHEN 5-325 MG PO TABS: 2.0000 | ORAL_TABLET | ORAL | Status: AC | PRN

## 2011-09-08 MED ORDER — MORPHINE SULFATE 4 MG/ML IJ SOLN
4.0000 mg | Freq: Once | INTRAMUSCULAR | Status: AC
  Administered 2011-09-08: 4 mg via INTRAMUSCULAR
  Filled 2011-09-08: qty 1

## 2011-09-08 NOTE — ED Provider Notes (Signed)
History     CSN: [id number]  Arrival date & time 09/08/11  2215   First MD Initiated Contact with Patient 09/08/11 2247      Chief Complaint  Patient presents with  . Dental Pain  . Oral Swelling    (Consider location/radiation/quality/duration/timing/severity/associated sxs/prior treatment) HPI  Patient presents to the emergency department with complaints of wound left lower molar tooth pain the patient was seen at [facility] yesterday in the emergency department for the same given Percocets and penicillin. Patient states she did get the Percocet filled because she didn't feel like they would work. She denies being unable to eat or swallow. She states that her face feels really swollen. The patient says she has a dental appointment tomorrow to get her tooth pulled. She states the pain was so bad that she could not wait until tomorrow to see the dentist before getting something done.  Past Medical History  Diagnosis Date  . Migraine   . GERD (gastroesophageal reflux disease)   . Ulcer     Past Surgical History  Procedure Date  . Myringotomy     No family history on file.  History  Substance Use Topics  . Smoking status: Current Everyday Smoker  . Smokeless tobacco: Not on file  . Alcohol Use: No    OB History    Grav Para Term Preterm Abortions TAB SAB Ect Mult Living                  Review of Systems  All other systems reviewed and are negative.    Allergies  Hydrocodone-acetaminophen  Home Medications   Current Outpatient Rx  Name Route Sig Dispense Refill  . AMITRIPTYLINE HCL 50 MG PO TABS Oral Take 100 mg by mouth at bedtime.      . BC HEADACHE POWDER PO Oral Take 1 packet by mouth as needed. For headache and pain    . ESOMEPRAZOLE MAGNESIUM 40 MG PO CPDR Oral Take 40 mg by mouth 2 (two) times daily.      . SUMATRIPTAN SUCCINATE 6 MG/0.5ML [place] SOLN Subcutaneous Inject 6 mg into the skin every 2 (two) hours as needed. For migraine    . ONDANSETRON HCL  4 MG PO TABS Oral Take 1 tablet (4 mg total) by mouth every 6 (six) hours. 12 tablet 0  . OXYCODONE-ACETAMINOPHEN 5-325 MG PO TABS Oral Take 2 tablets by mouth every 4 (four) hours as needed for pain. 15 tablet 0  . PENICILLIN V POTASSIUM 500 MG PO TABS Oral Take 1 tablet (500 mg total) by mouth 4 (four) times daily. 40 tablet 0    BP 134/72  Pulse 53  Temp(Src) 98.3 F (36.8 C) (Oral)  Resp 20  SpO2 97%  LMP 09/01/2011  Physical Exam  Constitutional: She appears well-developed and well-nourished. No distress.  HENT:  Head: Normocephalic and atraumatic. No trismus in the jaw.  Mouth/Throat: Uvula is midline, oropharynx is clear and moist and mucous membranes are normal. Normal dentition. Dental caries (Pts tooth shows no obvious abscess but moderate to severe tenderness to palpation of marked tooth) present. No uvula swelling. No oropharyngeal exudate or posterior oropharyngeal erythema.  Eyes: Pupils are equal, round, and reactive to light.  Neck: Trachea normal, normal range of motion and full passive range of motion without pain. Neck supple.  Cardiovascular: Normal rate, regular rhythm, normal heart sounds and normal pulses.   Pulmonary/Chest: Effort normal and breath sounds normal. No respiratory distress. Chest wall is not  dull to percussion. She exhibits no tenderness, no crepitus, no edema, no deformity and no retraction.  Abdominal: Normal appearance.  Musculoskeletal: Normal range of motion.  Neurological: She is alert. She has normal strength.  Skin: Skin is warm, dry and intact. She is not diaphoretic.  Psychiatric: She has a normal mood and affect. Her speech is normal. Cognition and memory are normal.    ED Course  Procedures (including critical care time)  Labs Reviewed - No data to display No results found.   1. Toothache       MDM  Pt appears to be uncomfortable but I am not concerned with abscess. Dr. Manus Gunning did not see an abscess yesterday and I do not  see one today either. She is not having any throat pain or trismus. Pt has been given a shot of Morphine and Toradol in ED and advised to have her Percocet filled. She says that she has a dental appointment tomorrow for tooth extraction.  Pt has been advised of the symptoms that warrant their return to the ED. Patient has voiced understanding and has agreed to follow-up with the PCP or specialist.         Dorthula Matas, PA 09/08/11 2351   Medical screening examination/treatment/procedure(s) were performed by non-physician practitioner and as supervising physician I was immediately available for consultation/collaboration.  Sunnie Nielsen, MD 09/10/11 [phone]

## 2011-09-08 NOTE — ED Provider Notes (Signed)
History     CSN: [id number]  Arrival date & time 09/07/11  2321   First MD Initiated Contact with Patient 09/08/11 0104      Chief Complaint  Patient presents with  . Dental Pain    (Consider location/radiation/quality/duration/timing/severity/associated sxs/prior treatment) HPI Comments: Patient complains of left lower molar dental pain for the past 2 days. She denies any trauma or fractured the tooth. She denies any difficulty breathing or swallowing. She denies any nausea or vomiting. She is scheduled to have her tooth pulled in 2 weeks. She taking Tylenol and Goody powder at home.  The history is provided by the patient.    Past Medical History  Diagnosis Date  . Migraine   . GERD (gastroesophageal reflux disease)   . Ulcer     Past Surgical History  Procedure Date  . Myringotomy     No family history on file.  History  Substance Use Topics  . Smoking status: Current Everyday Smoker  . Smokeless tobacco: Not on file  . Alcohol Use: No    OB History    Grav Para Term Preterm Abortions TAB SAB Ect Mult Living                  Review of Systems  Constitutional: Negative for fever, activity change and appetite change.  HENT: Positive for mouth sores and dental problem. Negative for neck pain and neck stiffness.   Respiratory: Negative for cough and shortness of breath.   Cardiovascular: Negative for chest pain.  Gastrointestinal: Negative for nausea and vomiting.  Genitourinary: Negative for dysuria, vaginal bleeding and vaginal discharge.  Musculoskeletal: Negative for back pain.  Neurological: Negative for headaches.    Allergies  Hydrocodone-acetaminophen  Home Medications   Current Outpatient Rx  Name Route Sig Dispense Refill  . AMITRIPTYLINE HCL 50 MG PO TABS Oral Take 100 mg by mouth at bedtime.      . BC HEADACHE POWDER PO Oral Take 1 packet by mouth as needed. For headache and pain    . ESOMEPRAZOLE MAGNESIUM 40 MG PO CPDR Oral Take 40 mg by  mouth 2 (two) times daily.      [name] ONDANSETRON HCL 4 MG PO TABS Oral Take 1 tablet (4 mg total) by mouth every 6 (six) hours. 12 tablet 0  . SUMATRIPTAN SUCCINATE 6 MG/0.5ML [place] SOLN Subcutaneous Inject 6 mg into the skin every 2 (two) hours as needed. For migraine      BP 124/77  Pulse 78  Temp(Src) 98.2 F (36.8 C) (Oral)  Resp 19  SpO2 99%  Physical Exam  Constitutional: She is oriented to person, place, and time. She appears well-developed and well-nourished. No distress.  HENT:  Head: Normocephalic and atraumatic.  Mouth/Throat: Oropharynx is clear and moist. No oropharyngeal exudate.    Eyes: Conjunctivae are normal. Pupils are equal, round, and reactive to light.  Neck: Normal range of motion. Neck supple.  Cardiovascular: Normal rate, regular rhythm and normal heart sounds.   No murmur heard. Pulmonary/Chest: Effort normal. No respiratory distress.  Abdominal: Soft. There is no tenderness. There is no rebound and no guarding.  Musculoskeletal: Normal range of motion. She exhibits no edema and no tenderness.  Neurological: She is alert and oriented to person, place, and time. No cranial nerve deficit.  Skin: Skin is warm.    ED Course  Procedures (including critical care time)  Labs Reviewed - No data to display No results found.   No diagnosis found.  MDM  Dental pain without obvious abscess. No airway involvement. No evidence of ludwig's angina.  Stable for followup with Dentist.       Glynn Octave, MD 09/08/11 [phone]

## 2011-09-08 NOTE — Discharge Instructions (Signed)
Toothache Toothaches are usually caused by tooth decay (cavity). However, other causes of toothache include:  Gum disease.   Cracked tooth.   Cracked filling.   Injury.   Jaw problem (temporo mandibular joint or TMJ disorder).   Tooth abscess.   Root sensitivity.   Grinding.   Eruption problems.  Swelling and redness around a painful tooth often means you have a dental abscess. Pain medicine and antibiotics can help reduce symptoms, but you will need to see a dentist within the next few days to have your problem properly evaluated and treated. If tooth decay is the problem, you may need a filling or root canal to save your tooth. If the problem is more severe, your tooth may need to be pulled. SEEK IMMEDIATE MEDICAL CARE IF:  You cannot swallow.   You develop severe swelling, increased redness, or increased pain in your mouth or face.   You have a fever.   You cannot open your mouth adequately.  Document Released: 07/10/2004 Document Revised: 05/22/2011 Document Reviewed: 08/30/2009 [employer] Patient Information 2012 [employer]. 

## 2011-09-08 NOTE — Discharge Instructions (Signed)
  Get your percocet filled and continue antibiotics until completion.  Dental Pain A tooth ache may be caused by cavities (tooth decay). Cavities expose the nerve of the tooth to air and hot or cold temperatures. It may come from an infection or abscess (also called a boil or furuncle) around your tooth. It is also often caused by dental caries (tooth decay). This causes the pain you are having. DIAGNOSIS  Your caregiver can diagnose this problem by exam. TREATMENT   If caused by an infection, it may be treated with medications which kill germs (antibiotics) and pain medications as prescribed by your caregiver. Take medications as directed.   Only take over-the-counter or prescription medicines for pain, discomfort, or fever as directed by your caregiver.   Whether the tooth ache today is caused by infection or dental disease, you should see your dentist as soon as possible for further care.  SEEK MEDICAL CARE IF: The exam and treatment you received today has been provided on an emergency basis only. This is not a substitute for complete medical or dental care. If your problem worsens or new problems (symptoms) appear, and you are unable to meet with your dentist, call or return to this location. SEEK IMMEDIATE MEDICAL CARE IF:   You have a fever.   You develop redness and swelling of your face, jaw, or neck.   You are unable to open your mouth.   You have severe pain uncontrolled by pain medicine.  MAKE SURE YOU:   Understand these instructions.   Will watch your condition.   Will get help right away if you are not doing well or get worse.  Document Released: 06/02/2005 Document Revised: 05/22/2011 Document Reviewed: 01/19/2008 [facility] Patient Information 2012 [place], [place].

## 2011-09-08 NOTE — ED Notes (Signed)
Pt states that she is having dental pain. Pt refuses to let me exam her mouth due to pain. Pt denies fever chills or drainage.

## 2011-09-08 NOTE — ED Notes (Signed)
Pt reports mouth swelling and pain started a couple hours ago and denies eating or drinking anything out of the ordinary.

## 2011-09-09 ENCOUNTER — Emergency Department (INDEPENDENT_AMBULATORY_CARE_PROVIDER_SITE_OTHER): Payer: Managed Care, Other (non HMO)

## 2011-09-09 ENCOUNTER — Emergency Department (HOSPITAL_BASED_OUTPATIENT_CLINIC_OR_DEPARTMENT_OTHER)
Admission: EM | Admit: 2011-09-09 | Discharge: 2011-09-09 | Disposition: A | Discharge status: Home / Self Care | Payer: Managed Care, Other (non HMO) | Attending: Emergency Medicine | Admitting: Emergency Medicine

## 2011-09-09 ENCOUNTER — Encounter (HOSPITAL_BASED_OUTPATIENT_CLINIC_OR_DEPARTMENT_OTHER): Payer: Self-pay | Admitting: Emergency Medicine

## 2011-09-09 DIAGNOSIS — K219 Gastro-esophageal reflux disease without esophagitis: Secondary | ICD-10-CM | POA: Insufficient documentation

## 2011-09-09 DIAGNOSIS — K0889 Other specified disorders of teeth and supporting structures: Secondary | ICD-10-CM

## 2011-09-09 DIAGNOSIS — K089 Disorder of teeth and supporting structures, unspecified: Secondary | ICD-10-CM | POA: Insufficient documentation

## 2011-09-09 DIAGNOSIS — R1310 Dysphagia, unspecified: Secondary | ICD-10-CM

## 2011-09-09 DIAGNOSIS — R221 Localized swelling, mass and lump, neck: Secondary | ICD-10-CM

## 2011-09-09 DIAGNOSIS — G8929 Other chronic pain: Secondary | ICD-10-CM | POA: Insufficient documentation

## 2011-09-09 DIAGNOSIS — R609 Edema, unspecified: Secondary | ICD-10-CM

## 2011-09-09 DIAGNOSIS — K1370 Unspecified lesions of oral mucosa: Secondary | ICD-10-CM

## 2011-09-09 DIAGNOSIS — J029 Acute pharyngitis, unspecified: Secondary | ICD-10-CM | POA: Insufficient documentation

## 2011-09-09 DIAGNOSIS — F17200 Nicotine dependence, unspecified, uncomplicated: Secondary | ICD-10-CM | POA: Insufficient documentation

## 2011-09-09 DIAGNOSIS — R220 Localized swelling, mass and lump, head: Secondary | ICD-10-CM | POA: Insufficient documentation

## 2011-09-09 HISTORY — DX: Other chronic pain: G89.29

## 2011-09-09 MED ORDER — MORPHINE SULFATE 4 MG/ML IJ SOLN
4.0000 mg | Freq: Once | INTRAMUSCULAR | Status: AC
  Administered 2011-09-09: 4 mg via INTRAVENOUS
  Filled 2011-09-09: qty 1

## 2011-09-09 MED ORDER — DEXTROSE 5 % IV SOLN
1.0000 g | Freq: Once | INTRAVENOUS | Status: AC
  Administered 2011-09-09: 1 g via INTRAVENOUS
  Filled 2011-09-09: qty 10

## 2011-09-09 MED ORDER — PENICILLIN V POTASSIUM 500 MG PO TABS: 500.0000 mg | ORAL_TABLET | Freq: Three times a day (TID) | ORAL | Status: AC

## 2011-09-09 MED ORDER — ONDANSETRON HCL 4 MG/2ML IJ SOLN
4.0000 mg | Freq: Once | INTRAMUSCULAR | Status: AC
  Administered 2011-09-09: 4 mg via INTRAVENOUS
  Filled 2011-09-09: qty 2

## 2011-09-09 MED ORDER — IOHEXOL 300 MG/ML  SOLN
75.0000 mL | Freq: Once | INTRAMUSCULAR | Status: AC | PRN
  Administered 2011-09-09: 75 mL via INTRAVENOUS
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
## 2011-09-09 NOTE — ED Provider Notes (Signed)
History     CSN: [id number]  Arrival date & time 09/09/11  1639   First MD Initiated Contact with Patient 09/09/11 1703      Chief Complaint  Patient presents with  . Sore Throat  . Facial Swelling  . Dental Pain    (Consider location/radiation/quality/duration/timing/severity/associated sxs/prior treatment) HPI Comments: 4th trip to the ER in 4 days for dental pain.  Has tried to get appointment with dentistry but no one can get her in.  Now having pain and swelling in the chin area.  Having difficulty eating and swallowing.  Patient is a 41 y.o. female presenting with pharyngitis and tooth pain. The history is provided by the patient.  Sore Throat This is a new problem. The current episode started more than 2 days ago. The problem occurs constantly. The problem has been gradually worsening. The symptoms are aggravated by swallowing and eating. The symptoms are relieved by nothing. She has tried nothing for the symptoms.  Dental Pain   Past Medical History  Diagnosis Date  . Migraine   . GERD (gastroesophageal reflux disease)   . Ulcer   . Toothache   . Chronic pain     Past Surgical History  Procedure Date  . Myringotomy     History reviewed. No pertinent family history.  History  Substance Use Topics  . Smoking status: Current Everyday Smoker  . Smokeless tobacco: Not on file  . Alcohol Use: No    OB History    Grav Para Term Preterm Abortions TAB SAB Ect Mult Living                  Review of Systems  Allergies  Hydrocodone-acetaminophen  Home Medications   Current Outpatient Rx  Name Route Sig Dispense Refill  . AMITRIPTYLINE HCL 50 MG PO TABS Oral Take 100 mg by mouth at bedtime.      . BC HEADACHE POWDER PO Oral Take 1 packet by mouth 5 (five) times daily. For headache and pain    . DIHYDROERGOTAMINE MESYLATE 4 MG/ML NA SOLN Nasal Place 1 spray into the nose as needed. Use in one nostril as directed.  No more than 4 sprays in one hour for  migraines    . ESOMEPRAZOLE MAGNESIUM 40 MG PO CPDR Oral Take 40 mg by mouth 2 (two) times daily.      [name] ONDANSETRON HCL 4 MG PO TABS Oral Take 1 tablet (4 mg total) by mouth every 6 (six) hours. 12 tablet 0  . OXYCODONE-ACETAMINOPHEN 5-325 MG PO TABS Oral Take 2 tablets by mouth every 4 (four) hours as needed for pain. 15 tablet 0  . PENICILLIN V POTASSIUM 500 MG PO TABS Oral Take 1 tablet (500 mg total) by mouth 4 (four) times daily. 40 tablet 0    LMP 09/01/2011  Physical Exam  ED Course  Procedures (including critical care time)  Labs Reviewed - No data to display No results found.   No diagnosis found.    MDM  There is no sign on ct of abscess/Ludwig's.  She was given rocephin and pain meds.  She will be given the number for Dr. Leanord Asal to arrange follow up in the next 2 days.          Geoffery Lyons, MD 09/09/11 [phone]

## 2011-09-09 NOTE — ED Notes (Signed)
Pt has wisdom tooth coming in on bottom left.  Pt c/o sore throat and swelling in jaw since Friday.  Seen at [facility] yesterday for same.

## 2011-09-09 NOTE — ED Notes (Signed)
Pt acuity changed from 4 to 3 based on plan of care. 

## 2011-09-09 NOTE — Discharge Instructions (Signed)
Dental Pain  A tooth ache may be caused by cavities (tooth decay). Cavities expose the nerve of the tooth to air and hot or cold temperatures. It may come from an infection or abscess (also called a boil or furuncle) around your tooth. It is also often caused by dental caries (tooth decay). This causes the pain you are having.  DIAGNOSIS   Your caregiver can diagnose this problem by exam.  TREATMENT   · If caused by an infection, it may be treated with medications which kill germs (antibiotics) and pain medications as prescribed by your caregiver. Take medications as directed.  · Only take over-the-counter or prescription medicines for pain, discomfort, or fever as directed by your caregiver.  · Whether the tooth ache today is caused by infection or dental disease, you should see your dentist as soon as possible for further care.  SEEK MEDICAL CARE IF:  The exam and treatment you received today has been provided on an emergency basis only. This is not a substitute for complete medical or dental care. If your problem worsens or new problems (symptoms) appear, and you are unable to meet with your dentist, call or return to this location.  SEEK IMMEDIATE MEDICAL CARE IF:   · You have a fever.  · You develop redness and swelling of your face, jaw, or neck.  · You are unable to open your mouth.  · You have severe pain uncontrolled by pain medicine.  MAKE SURE YOU:   · Understand these instructions.  · Will watch your condition.  · Will get help right away if you are not doing well or get worse.  Document Released: 06/02/2005 Document Revised: 05/22/2011 Document Reviewed: 01/19/2008  ExitCare® Patient Information ©2012 [employer].

## 2011-12-14 ENCOUNTER — Encounter (HOSPITAL_COMMUNITY): Payer: Self-pay | Admitting: *Deleted

## 2011-12-14 ENCOUNTER — Emergency Department (HOSPITAL_COMMUNITY)
Admission: EM | Admit: 2011-12-14 | Discharge: 2011-12-14 | Disposition: A | Discharge status: Home / Self Care | Payer: Managed Care, Other (non HMO) | Attending: Emergency Medicine | Admitting: Emergency Medicine

## 2011-12-14 DIAGNOSIS — G8929 Other chronic pain: Secondary | ICD-10-CM | POA: Insufficient documentation

## 2011-12-14 DIAGNOSIS — R109 Unspecified abdominal pain: Secondary | ICD-10-CM

## 2011-12-14 DIAGNOSIS — R110 Nausea: Secondary | ICD-10-CM | POA: Insufficient documentation

## 2011-12-14 DIAGNOSIS — F17200 Nicotine dependence, unspecified, uncomplicated: Secondary | ICD-10-CM | POA: Insufficient documentation

## 2011-12-14 DIAGNOSIS — R197 Diarrhea, unspecified: Secondary | ICD-10-CM | POA: Insufficient documentation

## 2011-12-14 DIAGNOSIS — K219 Gastro-esophageal reflux disease without esophagitis: Secondary | ICD-10-CM | POA: Insufficient documentation

## 2011-12-14 LAB — CBC WITH DIFFERENTIAL/PLATELET
Basophils Absolute: 0 10*3/uL (ref 0.0–0.1)
Basophils Relative: 1 % (ref 0–1)
Eosinophils Absolute: 0.2 10*3/uL (ref 0.0–0.7)
Eosinophils Relative: 3 % (ref 0–5)
HCT: 31.6 % — ABNORMAL LOW (ref 36.0–46.0)
MCHC: 34.2 g/dL (ref 30.0–36.0)
MCV: 78.2 fL (ref 78.0–100.0)
Monocytes Absolute: 0.4 10*3/uL (ref 0.1–1.0)
Neutro Abs: 3 10*3/uL (ref 1.7–7.7)
RDW: 16.4 % — ABNORMAL HIGH (ref 11.5–15.5)

## 2011-12-14 LAB — COMPREHENSIVE METABOLIC PANEL
AST: 22 U/L (ref 0–37)
Albumin: 4.2 g/dL (ref 3.5–5.2)
Calcium: 9.5 mg/dL (ref 8.4–10.5)
Creatinine, Ser: 0.59 mg/dL (ref 0.50–1.10)
Total Protein: 7.6 g/dL (ref 6.0–8.3)

## 2011-12-14 LAB — URINALYSIS, ROUTINE W REFLEX MICROSCOPIC
Bilirubin Urine: NEGATIVE
Nitrite: NEGATIVE
Specific Gravity, Urine: 1.022 (ref 1.005–1.030)
Urobilinogen, UA: 0.2 mg/dL (ref 0.0–1.0)

## 2011-12-14 LAB — URINE MICROSCOPIC-ADD ON

## 2011-12-14 MED ORDER — SUCRALFATE 1 G PO TABS: 1.0000 g | ORAL_TABLET | Freq: Four times a day (QID) | ORAL | Status: DC

## 2011-12-14 MED ORDER — OXYCODONE-ACETAMINOPHEN 5-325 MG PO TABS
1.0000 | ORAL_TABLET | Freq: Once | ORAL | Status: AC
  Administered 2011-12-14: 1 via ORAL
  Filled 2011-12-14: qty 1

## 2011-12-14 NOTE — ED Provider Notes (Signed)
History     CSN: [id number]  Arrival date & time 12/14/11  1408   First MD Initiated Contact with Patient 12/14/11 1706      Chief Complaint  Patient presents with  . Abdominal Pain    Umbilical pain  . Nausea  . Diarrhea    (Consider location/radiation/quality/duration/timing/severity/associated sxs/prior treatment) Patient is a 41 y.o. female presenting with abdominal pain and diarrhea. The history is provided by the patient.  Abdominal Pain The primary symptoms of the illness include abdominal pain and diarrhea. The current episode started more than 2 days ago. The onset of the illness was gradual. The problem has not changed since onset. The abdominal pain is located in the periumbilical region and epigastric region. The abdominal pain does not radiate. Pain scale: nagging and throbbing, waxes and wanes. The abdominal pain is relieved by nothing. The abdominal pain is exacerbated by eating (eating BBQ wings).  The diarrhea occurs once per day.  Additional symptoms associated with the illness include anorexia. Symptoms associated with the illness do not include chills or urgency. Significant associated medical issues include PUD.  Diarrhea The primary symptoms include abdominal pain and diarrhea.  The illness is also significant for anorexia. The illness does not include chills. Significant associated medical issues include PUD.    Past Medical History  Diagnosis Date  . Migraine   . GERD (gastroesophageal reflux disease)   . Ulcer   . Toothache   . Chronic pain     Past Surgical History  Procedure Date  . Myringotomy    No gb or appendix History reviewed. No pertinent family history.  History  Substance Use Topics  . Smoking status: Current Everyday Smoker -- 0.2 packs/day    Types: Cigarettes  . Smokeless tobacco: Never Used  . Alcohol Use: Yes     occ    OB History    Grav Para Term Preterm Abortions TAB SAB Ect Mult Living                  Review of  Systems  Constitutional: Negative for chills.  Gastrointestinal: Positive for abdominal pain, diarrhea and anorexia.  Genitourinary: Negative for urgency.  All other systems reviewed and are negative.    Allergies  Hydrocodone-acetaminophen  Home Medications   Current Outpatient Rx  Name Route Sig Dispense Refill  . AMITRIPTYLINE HCL 50 MG PO TABS Oral Take 100 mg by mouth at bedtime.     . BC HEADACHE POWDER PO Oral Take 1 packet by mouth 5 (five) times daily. For headache and pain    . DIHYDROERGOTAMINE MESYLATE 4 MG/ML NA SOLN Nasal Place 1 spray into the nose as needed. Use in one nostril as directed.  No more than 4 sprays in one hour for migraines    . ESOMEPRAZOLE MAGNESIUM 40 MG PO CPDR Oral Take 40 mg by mouth 2 (two) times daily.        BP 110/72  Pulse 50  Temp 98.4 F (36.9 C) (Oral)  Resp 17  Ht 5\' 4"  (1.626 m)  Wt 160 lb (72.576 kg)  BMI 27.46 kg/m2  SpO2 100%  LMP 11/30/2011  Physical Exam  Nursing note and vitals reviewed. Constitutional: She appears well-developed and well-nourished. No distress.  HENT:  Head: Normocephalic and atraumatic.  Right Ear: External ear normal.  Left Ear: External ear normal.  Eyes: Conjunctivae are normal. Right eye exhibits no discharge. Left eye exhibits no discharge. No scleral icterus.  Neck: Neck supple. No tracheal  deviation present.  Cardiovascular: Normal rate, regular rhythm and intact distal pulses.   Pulmonary/Chest: Effort normal and breath sounds normal. No stridor. No respiratory distress. She has no wheezes. She has no rales.  Abdominal: Soft. Bowel sounds are normal. She exhibits no distension. There is tenderness in the epigastric area and periumbilical area. There is no rigidity, no rebound, no guarding and no CVA tenderness. No hernia.  Musculoskeletal: She exhibits no edema and no tenderness.  Neurological: She is alert. She has normal strength. No sensory deficit. Cranial nerve deficit:  no gross defecits  noted. She exhibits normal muscle tone. She displays no seizure activity. Coordination normal.  Skin: Skin is warm and dry. No rash noted.  Psychiatric: She has a normal mood and affect.    ED Course  Procedures (including critical care time)  Labs Reviewed  URINALYSIS, ROUTINE W REFLEX MICROSCOPIC - Abnormal; Notable for the following:    APPearance CLOUDY (*)     Hgb urine dipstick TRACE (*)     All other components within normal limits  CBC WITH DIFFERENTIAL - Abnormal; Notable for the following:    Hemoglobin 10.8 (*)     HCT 31.6 (*)     RDW 16.4 (*)     Platelets 437 (*)     Lymphocytes Relative 47 (*)     All other components within normal limits  COMPREHENSIVE METABOLIC PANEL - Abnormal; Notable for the following:    Potassium 3.1 (*)     BUN 5 (*)     Total Bilirubin 0.1 (*)     All other components within normal limits  PREGNANCY, URINE  URINE MICROSCOPIC-ADD ON  LIPASE, BLOOD   No results found.     MDM  The patient's abdominal exam is benign. Have a low suspicion for appendicitis or cholecystitis. I reviewed her old records. In the last year patient has had a normal abdominal pelvic CT as well as a normal abdominal ultrasound. The patient has been eating spicy foods and taking a lot of aspirin. We discussed diet modification..  I'll prescribe Carafate and have the patient return to emergency room she denies having any fever or worsening symptoms        Celene Kras, MD 12/14/11 [phone]

## 2011-12-14 NOTE — ED Notes (Signed)
abd pain started 3-4 days ago hurts worse after eating. States "I think I have an ulcer" has hx of ulcers

## 2011-12-14 NOTE — Discharge Instructions (Signed)

## 2011-12-14 NOTE — ED Notes (Signed)
Pt from home with reports of abdominal pain in the umbilical area that started about 3-4 days ago as well as nausea. Pt also endorses diarrhea today. Pt reports that pain is worse with eating and endorses same type of pain last year when she was told that she had stomach ulcers.

## 2012-02-01 ENCOUNTER — Encounter (HOSPITAL_BASED_OUTPATIENT_CLINIC_OR_DEPARTMENT_OTHER): Payer: Self-pay | Admitting: *Deleted

## 2012-02-01 ENCOUNTER — Emergency Department (HOSPITAL_BASED_OUTPATIENT_CLINIC_OR_DEPARTMENT_OTHER)
Admission: EM | Admit: 2012-02-01 | Discharge: 2012-02-01 | Disposition: A | Discharge status: Home / Self Care | Payer: Managed Care, Other (non HMO) | Attending: Emergency Medicine | Admitting: Emergency Medicine

## 2012-02-01 DIAGNOSIS — K089 Disorder of teeth and supporting structures, unspecified: Secondary | ICD-10-CM | POA: Insufficient documentation

## 2012-02-01 DIAGNOSIS — G8929 Other chronic pain: Secondary | ICD-10-CM | POA: Insufficient documentation

## 2012-02-01 DIAGNOSIS — Z888 Allergy status to other drugs, medicaments and biological substances status: Secondary | ICD-10-CM | POA: Insufficient documentation

## 2012-02-01 DIAGNOSIS — R51 Headache: Secondary | ICD-10-CM | POA: Insufficient documentation

## 2012-02-01 DIAGNOSIS — F17200 Nicotine dependence, unspecified, uncomplicated: Secondary | ICD-10-CM | POA: Insufficient documentation

## 2012-02-01 DIAGNOSIS — K219 Gastro-esophageal reflux disease without esophagitis: Secondary | ICD-10-CM | POA: Insufficient documentation

## 2012-02-01 MED ORDER — METOCLOPRAMIDE HCL 5 MG/ML IJ SOLN
10.0000 mg | Freq: Once | INTRAMUSCULAR | Status: AC
  Administered 2012-02-01: 10 mg via INTRAVENOUS
  Filled 2012-02-01: qty 2

## 2012-02-01 MED ORDER — KETOROLAC TROMETHAMINE 30 MG/ML IJ SOLN
30.0000 mg | Freq: Once | INTRAMUSCULAR | Status: AC
  Administered 2012-02-01: 30 mg via INTRAVENOUS
  Filled 2012-02-01: qty 1

## 2012-02-01 MED ORDER — SODIUM CHLORIDE 0.9 % IV BOLUS (SEPSIS)
1000.0000 mL | Freq: Once | INTRAVENOUS | Status: AC
  Administered 2012-02-01: 1000 mL via INTRAVENOUS

## 2012-02-01 NOTE — ED Notes (Signed)
Pt states she feels much better- ride present

## 2012-02-01 NOTE — ED Notes (Signed)
Pt has hx of migraines, but is now c/o headache off and on for 2 weeks. +nausea. Denies other s/s.

## 2012-02-01 NOTE — ED Provider Notes (Signed)
History  This chart was scribed for Jenny B. Bernette Mayers, MD by Shari Heritage. The patient was seen in room MH09/MH09. Patient's care was started at 1807.     CSN: [id number]  Arrival date & time 02/01/12  1807   First MD Initiated Contact with Patient 02/01/12 1824      Chief Complaint  Patient presents with  . Headache    The history is provided by the patient. No language interpreter was used.    [name] is a 41 y.o. female who presents to the Emergency Department complaining of a moderate, constant headache onset more than 12 hours ago. Associated symptoms include nausea, vomiting and photophobia. Patient says that she has been having this HA intermittently for the past 2 weeks, but today it has been persistent. She has tried [employer] and Excedrin for the headache with no relief. She says that she has only taken 1 narcotic pain in the past 2 weeks for headache relief. Patient states that this current pain is different in location but not character or severity from her usual migraine pain. She also has a medical history of ulcer and GERD.  Past Medical History  Diagnosis Date  . Migraine   . GERD (gastroesophageal reflux disease)   . Ulcer   . Toothache   . Chronic pain     Past Surgical History  Procedure Date  . Myringotomy     History reviewed. No pertinent family history.  History  Substance Use Topics  . Smoking status: Current Everyday Smoker -- 0.2 packs/day    Types: Cigarettes  . Smokeless tobacco: Never Used  . Alcohol Use: Yes     occ    OB History    Grav Para Term Preterm Abortions TAB SAB Ect Mult Living                  Review of Systems A complete 10 system review of systems was obtained and all systems are negative except as noted in the HPI and PMH.   Allergies  Hydrocodone-acetaminophen  Home Medications   Current Outpatient Rx  Name Route Sig Dispense Refill  . AMITRIPTYLINE HCL 50 MG PO TABS Oral Take 100 mg by mouth at  bedtime.     . BC HEADACHE POWDER PO Oral Take 1 packet by mouth 5 (five) times daily. For headache and pain    . DIHYDROERGOTAMINE MESYLATE 4 MG/ML NA SOLN Nasal Place 1 spray into the nose as needed. Use in one nostril as directed.  No more than 4 sprays in one hour for migraines    . ESOMEPRAZOLE MAGNESIUM 40 MG PO CPDR Oral Take 40 mg by mouth 2 (two) times daily.      . SUCRALFATE 1 G PO TABS Oral Take 1 tablet (1 g total) by mouth 4 (four) times daily. 28 tablet 1    BP 112/67  Pulse 92  Temp 97.6 F (36.4 C) (Oral)  Resp 18  Ht 5\' 5"  (1.651 m)  Wt 160 lb (72.576 kg)  BMI 26.63 kg/m2  SpO2 96%  LMP 01/15/2012  Physical Exam  Nursing note and vitals reviewed. Constitutional: She is oriented to person, place, and time. She appears well-developed and well-nourished.  HENT:  Head: Normocephalic and atraumatic.  Eyes: EOM are normal.       Pinpoint pupils.  Neck: Normal range of motion. Neck supple.  Cardiovascular: Normal rate, normal heart sounds and intact distal pulses.   Pulmonary/Chest: Effort normal and breath sounds  normal.  Abdominal: Bowel sounds are normal. She exhibits no distension. There is no tenderness.  Musculoskeletal: Normal range of motion. She exhibits no edema and no tenderness.  Neurological: She is alert and oriented to person, place, and time. She has normal strength. No cranial nerve deficit or sensory deficit.  Skin: Skin is warm and dry. No rash noted.  Psychiatric: She has a normal mood and affect.    ED Course  Procedures (including critical care time) DIAGNOSTIC STUDIES: Oxygen Saturation is 96% on room air, adequate by my interpretation.    COORDINATION OF CARE: 6:25pm- Patient informed of current plan for treatment and evaluation and agrees with plan at this time.   Labs Reviewed - No data to display  No results found.   No diagnosis found.    MDM  Chronic headache. Likely rebound to some extent. Advised to avoid narcotics.        I personally performed the services described in the documentation, which were scribed in my presence. The recorded information has been reviewed and considered.     Jenny B. Bernette Mayers, MD 02/01/12 Corky Crafts

## 2012-02-01 NOTE — Discharge Instructions (Signed)
Headaches, Analgesic Rebound Analgesic agents are prescription or over-the-counter medications used to control pain, including headaches. However, overuse or misuse of theses medications can lead to rebound headaches. Rebound headaches are headaches that recur after the analgesic medication wears off. Eventually, the rebound headaches can become longlasting (chronic). If this happens, you must completely stop using analgesic medications. If not, the chronic headache is likely to continue despite the use of any other treatment. Usually when you stop taking analgesic medications, the headache may initally get worse for several days. Along with this you may experience sickness in your stomach (nausea), and you may throw up (vomit). After a period of 3 to 5 days, these symptoms begin to improve. Sometimes improvement may take longer. Eventually, the headaches will slowly improve with treatment with the right medications. Most people are able to stop using analgesic medications at home with a caregiver's supervision. But some find it difficult and may require hospitalization. Document Released: 08/23/2003 Document Revised: 05/22/2011 Document Reviewed: 01/20/2008 [facility] Patient Information 2012 [place], [place].

## 2012-02-20 ENCOUNTER — Encounter (HOSPITAL_BASED_OUTPATIENT_CLINIC_OR_DEPARTMENT_OTHER): Payer: Self-pay | Admitting: *Deleted

## 2012-02-20 DIAGNOSIS — G8929 Other chronic pain: Secondary | ICD-10-CM | POA: Insufficient documentation

## 2012-02-20 DIAGNOSIS — K6289 Other specified diseases of anus and rectum: Secondary | ICD-10-CM | POA: Insufficient documentation

## 2012-02-20 DIAGNOSIS — F17200 Nicotine dependence, unspecified, uncomplicated: Secondary | ICD-10-CM | POA: Insufficient documentation

## 2012-02-20 DIAGNOSIS — K219 Gastro-esophageal reflux disease without esophagitis: Secondary | ICD-10-CM | POA: Insufficient documentation

## 2012-02-20 NOTE — ED Notes (Signed)
Pt c/o Hemorid x 3 days 

## 2012-02-21 ENCOUNTER — Emergency Department (HOSPITAL_BASED_OUTPATIENT_CLINIC_OR_DEPARTMENT_OTHER)
Admission: EM | Admit: 2012-02-21 | Discharge: 2012-02-21 | Disposition: A | Discharge status: Home / Self Care | Payer: Managed Care, Other (non HMO) | Attending: Emergency Medicine | Admitting: Emergency Medicine

## 2012-02-21 DIAGNOSIS — K602 Anal fissure, unspecified: Secondary | ICD-10-CM

## 2012-02-21 MED ORDER — LIDOCAINE (ANORECTAL) 5 % EX CREA: TOPICAL_CREAM | CUTANEOUS | Status: DC

## 2012-02-21 NOTE — ED Notes (Signed)
Pt with c/o hemrrhoids x 3 days

## 2012-02-21 NOTE — ED Provider Notes (Signed)
History     CSN: [id number]  Arrival date & time 02/20/12  2158   First MD Initiated Contact with Patient 02/21/12 0032      Chief Complaint  Patient presents with  . Hemorrhoids    (Consider location/radiation/quality/duration/timing/severity/associated sxs/prior treatment) HPI This is a 41 year old black female with a history of chronic constipation. She was placed on an unspecified laxatives by her primary care physician about 2 weeks ago. About a week ago she developed what she believes is hemorrhoids. She is having pain and swelling in her anal region. The symptoms are moderate and had improved for several days but worsened recently. She states the pain is improved by bearing down during a bowel movement. She denies nausea, vomiting abdominal pain. She has been applying Preparation H without adequate relief. There has been some mild bleeding.  Past Medical History  Diagnosis Date  . Migraine   . GERD (gastroesophageal reflux disease)   . Ulcer   . Toothache   . Chronic pain     Past Surgical History  Procedure Date  . Myringotomy     History reviewed. No pertinent family history.  History  Substance Use Topics  . Smoking status: Current Everyday Smoker -- 0.2 packs/day    Types: Cigarettes  . Smokeless tobacco: Never Used  . Alcohol Use: Yes     occ    OB History    Grav Para Term Preterm Abortions TAB SAB Ect Mult Living                  Review of Systems  All other systems reviewed and are negative.    Allergies  Hydrocodone-acetaminophen  Home Medications   Current Outpatient Rx  Name Route Sig Dispense Refill  . AMITRIPTYLINE HCL 50 MG PO TABS Oral Take 100 mg by mouth at bedtime.     . BC HEADACHE POWDER PO Oral Take 1 packet by mouth 5 (five) times daily. For headache and pain    . DIHYDROERGOTAMINE MESYLATE 4 MG/ML NA SOLN Nasal Place 1 spray into the nose as needed. Use in one nostril as directed.  No more than 4 sprays in one hour for  migraines    . ESOMEPRAZOLE MAGNESIUM 40 MG PO CPDR Oral Take 40 mg by mouth 2 (two) times daily.      . SUCRALFATE 1 G PO TABS Oral Take 1 tablet (1 g total) by mouth 4 (four) times daily. 28 tablet 1    BP 109/67  Pulse 84  Temp 97.8 F (36.6 C)  Resp 16  Ht 5\' 4"  (1.626 m)  Wt 153 lb (69.4 kg)  BMI 26.26 kg/m2  SpO2 100%  LMP 02/17/2012  Physical Exam General: Well-developed, well-nourished female in no acute distress; appearance consistent with age of record HENT: normocephalic, atraumatic Eyes: pupils equal round and reactive to light; extraocular muscles intact Neck: supple Heart: regular rate and rhythm Lungs: clear to auscultation bilaterally Abdomen: soft; nondistended; nontender; no masses or hepatosplenomegaly; bowel sounds present Rectal: No hemorrhoids seen; normal sphincter tone; tenderness along anterior midline Extremities: No deformity; full range of motion Neurologic: Awake, alert and oriented; motor function intact in all extremities and symmetric; no facial droop Skin: Warm and dry Psychiatric: Normal mood and affect    ED Course  Procedures (including critical care time)     MDM  Examination consistent with anal fissure.        [name], MD 02/21/12 0040

## 2012-02-27 ENCOUNTER — Encounter (HOSPITAL_COMMUNITY): Payer: Self-pay

## 2012-02-27 ENCOUNTER — Emergency Department (HOSPITAL_COMMUNITY)
Admission: EM | Admit: 2012-02-27 | Discharge: 2012-02-27 | Disposition: A | Discharge status: Home / Self Care | Payer: Managed Care, Other (non HMO) | Attending: Emergency Medicine | Admitting: Emergency Medicine

## 2012-02-27 ENCOUNTER — Emergency Department (HOSPITAL_COMMUNITY): Payer: Managed Care, Other (non HMO)

## 2012-02-27 DIAGNOSIS — H53149 Visual discomfort, unspecified: Secondary | ICD-10-CM | POA: Insufficient documentation

## 2012-02-27 DIAGNOSIS — R51 Headache: Secondary | ICD-10-CM | POA: Insufficient documentation

## 2012-02-27 DIAGNOSIS — H538 Other visual disturbances: Secondary | ICD-10-CM | POA: Insufficient documentation

## 2012-02-27 DIAGNOSIS — R42 Dizziness and giddiness: Secondary | ICD-10-CM | POA: Insufficient documentation

## 2012-02-27 DIAGNOSIS — R519 Headache, unspecified: Secondary | ICD-10-CM | POA: Diagnosis present

## 2012-02-27 LAB — POCT PREGNANCY, URINE: Preg Test, Ur: NEGATIVE

## 2012-02-27 MED ORDER — METOCLOPRAMIDE HCL 5 MG/ML IJ SOLN
10.0000 mg | Freq: Once | INTRAMUSCULAR | Status: AC
  Administered 2012-02-27: 10 mg via INTRAVENOUS
  Filled 2012-02-27: qty 2

## 2012-02-27 MED ORDER — SODIUM CHLORIDE 0.9 % IV BOLUS (SEPSIS)
1000.0000 mL | INTRAVENOUS | Status: AC
  Administered 2012-02-27: 1000 mL via INTRAVENOUS

## 2012-02-27 MED ORDER — DIPHENHYDRAMINE HCL 50 MG/ML IJ SOLN
25.0000 mg | Freq: Once | INTRAMUSCULAR | Status: AC
  Administered 2012-02-27: 25 mg via INTRAVENOUS
  Filled 2012-02-27: qty 1

## 2012-02-27 MED ORDER — DEXAMETHASONE SODIUM PHOSPHATE 10 MG/ML IJ SOLN
10.0000 mg | Freq: Once | INTRAMUSCULAR | Status: AC
  Administered 2012-02-27: 10 mg via INTRAVENOUS
  Filled 2012-02-27: qty 1

## 2012-02-27 NOTE — ED Provider Notes (Signed)
I saw and evaluated the patient, reviewed the resident's note and I agree with the findings and plan.  Patient given migraine cocktail here feels better. Head CT was negative. Neurological exam stable. No concern for subarachnoid. Patient stable for discharge  Toy Baker, MD 02/27/12 2144

## 2012-02-27 NOTE — ED Notes (Signed)
D/c instructions reviewed w/ pt and family - pt and family deny any further questions or concerns at present. Pt ambulating independently w/ steady gait on d/c in no acute distress, A&Ox4.  

## 2012-02-27 NOTE — ED Provider Notes (Signed)
History     CSN: [id number]  Arrival date & time 02/27/12  1810   First MD Initiated Contact with Patient 02/27/12 2013      Chief Complaint  Patient presents with  . Headache    (Consider location/radiation/quality/duration/timing/severity/associated sxs/prior treatment) Patient is a 41 y.o. female presenting with headaches. The history is provided by the patient.  Headache  This is a new problem. The current episode started more than 1 week ago. The problem occurs constantly. Progression since onset: waxing/waning. Associated with: unknown. Pain location: diffuse. The quality of the pain is described as sharp. The pain is at a severity of 8/10. The pain is moderate. The pain does not radiate. Associated symptoms include nausea. Pertinent negatives include no fever, no shortness of breath and no vomiting. She has tried NSAIDs for the symptoms. The treatment provided mild relief.    Past Medical History  Diagnosis Date  . Migraine   . GERD (gastroesophageal reflux disease)   . Ulcer   . Toothache   . Chronic pain     Past Surgical History  Procedure Date  . Myringotomy     No family history on file.  History  Substance Use Topics  . Smoking status: Current Every Day Smoker -- 0.2 packs/day    Types: Cigarettes  . Smokeless tobacco: Never Used  . Alcohol Use: Yes     occ    OB History    Grav Para Term Preterm Abortions TAB SAB Ect Mult Living                  Review of Systems  Constitutional: Negative for fever and fatigue.  HENT: Negative for congestion, drooling and neck pain.   Eyes: Positive for photophobia. Negative for pain.  Respiratory: Negative for cough and shortness of breath.   Cardiovascular: Negative for chest pain.  Gastrointestinal: Positive for nausea. Negative for vomiting, abdominal pain and diarrhea.  Genitourinary: Negative for dysuria and hematuria.  Musculoskeletal: Negative for back pain and gait problem.  Skin: Negative for color  change.  Neurological: Positive for headaches. Negative for dizziness.  Hematological: Negative for adenopathy.  Psychiatric/Behavioral: Negative for behavioral problems.  All other systems reviewed and are negative.    Allergies  Hydrocodone-acetaminophen  Home Medications   Current Outpatient Rx  Name Route Sig Dispense Refill  . AMITRIPTYLINE HCL 50 MG PO TABS Oral Take 100 mg by mouth at bedtime.     . BC HEADACHE POWDER PO Oral Take 1 packet by mouth 5 (five) times daily as needed. For headache and pain    . ESOMEPRAZOLE MAGNESIUM 40 MG PO CPDR Oral Take 40 mg by mouth 2 (two) times daily.        BP 95/57  Pulse 70  Temp 98.4 F (36.9 C) (Oral)  Resp 18  SpO2 98%  LMP 02/17/2012  Physical Exam  Nursing note and vitals reviewed. Constitutional: She is oriented to person, place, and time. She appears well-developed and well-nourished.  HENT:  Head: Normocephalic.  Mouth/Throat: No oropharyngeal exudate.  Eyes: Conjunctivae normal and EOM are normal. Pupils are equal, round, and reactive to light.  Neck: Normal range of motion. Neck supple.  Cardiovascular: Normal rate, regular rhythm, normal heart sounds and intact distal pulses.  Exam reveals no gallop and no friction rub.   No murmur heard. Pulmonary/Chest: Effort normal and breath sounds normal. No respiratory distress. She has no wheezes.  Abdominal: Soft. Bowel sounds are normal. There is no tenderness. There is  no rebound and no guarding.  Musculoskeletal: Normal range of motion. She exhibits no edema and no tenderness.  Neurological: She is alert and oriented to person, place, and time. She has normal strength. No cranial nerve deficit or sensory deficit. She displays a negative Romberg sign. Coordination and gait normal.  Skin: Skin is warm and dry.  Psychiatric: She has a normal mood and affect. Her behavior is normal.    ED Course  Procedures (including critical care time)   Labs Reviewed  POCT  PREGNANCY, URINE   Ct Head Wo Contrast  02/27/2012  *RADIOLOGY REPORT*  Clinical Data: Fall headache, dizziness, blurry vision.  CT HEAD WITHOUT CONTRAST  Technique:  Contiguous axial images were obtained from the base of the skull through the vertex without contrast.  Comparison: 01/30/2009  Findings: There is no evidence for acute hemorrhage, hydrocephalus, mass lesion, or abnormal extra-axial fluid collection.  No definite CT evidence for acute infarction.  The visualized paranasal sinuses and mastoid air cells are predominately clear.  IMPRESSION: No acute intracranial abnormality.   Original Report Authenticated By: Waneta Martins, M.D.      1. Headache       MDM  12:15 AM 41 y.o. female w hx of migraines pw HA x 3 weeks. Pt notes nausea, but denies fever, vomiting. Pt notes HA is waxing/waning and sometimes completely gone, but returns. Pt AFVSS here, neurologically intact on exam. Will get migraine cocktail and plan for CT head.    12:15 AM: CT head non-contrib. Pt continues to appear well.  I have discussed the diagnosis/risks/treatment options with the patient and believe the pt to be eligible for discharge home to follow-up with migraine clinic as scheduled. We also discussed returning to the ED immediately if new or worsening sx occur. We discussed the sx which are most concerning (e.g., worsening pain, fever) that necessitate immediate return. Any new prescriptions provided to the patient are listed below.  New Prescriptions   No medications on file   Clinical Impression 1. Headache         Purvis Sheffield, MD 02/28/12 [phone]

## 2012-02-27 NOTE — ED Notes (Signed)
Pt complains of headache, light headed and dizzy for three weeks, nad noted intraige.

## 2012-02-28 NOTE — ED Provider Notes (Signed)
I saw and evaluated the patient, reviewed the resident's note and I agree with the findings and plan.  Alpa Salvo T Haris Baack, MD 02/28/12 1656 

## 2012-05-18 ENCOUNTER — Emergency Department (HOSPITAL_BASED_OUTPATIENT_CLINIC_OR_DEPARTMENT_OTHER)
Admission: EM | Admit: 2012-05-18 | Discharge: 2012-05-18 | Disposition: A | Discharge status: Home / Self Care | Payer: Managed Care, Other (non HMO) | Attending: Emergency Medicine | Admitting: Emergency Medicine

## 2012-05-18 ENCOUNTER — Emergency Department (HOSPITAL_BASED_OUTPATIENT_CLINIC_OR_DEPARTMENT_OTHER): Payer: Managed Care, Other (non HMO)

## 2012-05-18 ENCOUNTER — Encounter (HOSPITAL_BASED_OUTPATIENT_CLINIC_OR_DEPARTMENT_OTHER): Payer: Self-pay

## 2012-05-18 DIAGNOSIS — Z3202 Encounter for pregnancy test, result negative: Secondary | ICD-10-CM | POA: Insufficient documentation

## 2012-05-18 DIAGNOSIS — F17200 Nicotine dependence, unspecified, uncomplicated: Secondary | ICD-10-CM | POA: Insufficient documentation

## 2012-05-18 DIAGNOSIS — Z8711 Personal history of peptic ulcer disease: Secondary | ICD-10-CM | POA: Insufficient documentation

## 2012-05-18 DIAGNOSIS — G43909 Migraine, unspecified, not intractable, without status migrainosus: Secondary | ICD-10-CM | POA: Insufficient documentation

## 2012-05-18 DIAGNOSIS — R1013 Epigastric pain: Secondary | ICD-10-CM | POA: Insufficient documentation

## 2012-05-18 DIAGNOSIS — D649 Anemia, unspecified: Secondary | ICD-10-CM | POA: Insufficient documentation

## 2012-05-18 DIAGNOSIS — Z87898 Personal history of other specified conditions: Secondary | ICD-10-CM | POA: Insufficient documentation

## 2012-05-18 DIAGNOSIS — G8929 Other chronic pain: Secondary | ICD-10-CM | POA: Insufficient documentation

## 2012-05-18 DIAGNOSIS — K219 Gastro-esophageal reflux disease without esophagitis: Secondary | ICD-10-CM | POA: Insufficient documentation

## 2012-05-18 DIAGNOSIS — R110 Nausea: Secondary | ICD-10-CM | POA: Insufficient documentation

## 2012-05-18 LAB — LIPASE, BLOOD: Lipase: 32 U/L (ref 11–59)

## 2012-05-18 LAB — CBC WITH DIFFERENTIAL/PLATELET
Basophils Absolute: 0 10*3/uL (ref 0.0–0.1)
Eosinophils Absolute: 0.1 10*3/uL (ref 0.0–0.7)
Lymphocytes Relative: 40 % (ref 12–46)
Lymphs Abs: 3.2 10*3/uL (ref 0.7–4.0)
MCH: 25.1 pg — ABNORMAL LOW (ref 26.0–34.0)
Neutrophils Relative %: 50 % (ref 43–77)
Platelets: 479 10*3/uL — ABNORMAL HIGH (ref 150–400)
RBC: 3.54 MIL/uL — ABNORMAL LOW (ref 3.87–5.11)
RDW: 16.2 % — ABNORMAL HIGH (ref 11.5–15.5)
WBC: 8 10*3/uL (ref 4.0–10.5)

## 2012-05-18 LAB — COMPREHENSIVE METABOLIC PANEL
Albumin: 4 g/dL (ref 3.5–5.2)
BUN: 8 mg/dL (ref 6–23)
Calcium: 9.2 mg/dL (ref 8.4–10.5)
GFR calc Af Amer: >90 mL/min (ref >90)
Glucose, Bld: 101 mg/dL — ABNORMAL HIGH (ref 70–99)
Sodium: 137 mEq/L (ref 135–145)
Total Protein: 7.2 g/dL (ref 6.0–8.3)

## 2012-05-18 LAB — URINALYSIS, ROUTINE W REFLEX MICROSCOPIC
Nitrite: NEGATIVE
Specific Gravity, Urine: 1.016 (ref 1.005–1.030)
Urobilinogen, UA: 1 mg/dL (ref 0.0–1.0)
pH: 5.5 (ref 5.0–8.0)

## 2012-05-18 LAB — URINE MICROSCOPIC-ADD ON

## 2012-05-18 MED ORDER — KETOROLAC TROMETHAMINE 60 MG/2ML IM SOLN
60.0000 mg | Freq: Once | INTRAMUSCULAR | Status: AC
  Administered 2012-05-18: 60 mg via INTRAMUSCULAR
  Filled 2012-05-18: qty 22

## 2012-05-18 MED ORDER — GI COCKTAIL ~~LOC~~
30.0000 mL | Freq: Once | ORAL | Status: AC
  Administered 2012-05-18: 30 mL via ORAL
  Filled 2012-05-18: qty 30

## 2012-05-18 MED ORDER — OMEPRAZOLE 20 MG PO CPDR: 20.0000 mg | DELAYED_RELEASE_CAPSULE | Freq: Two times a day (BID) | ORAL | Status: DC

## 2012-05-18 NOTE — ED Notes (Signed)
Patient transported to Ultrasound 

## 2012-05-18 NOTE — ED Notes (Signed)
Abd pain x 2 weeks

## 2012-05-18 NOTE — ED Provider Notes (Signed)
History     CSN: [id number]  Arrival date & time 05/18/12  2103   First MD Initiated Contact with Patient 05/18/12 2127      Chief Complaint  Patient presents with  . Abdominal Pain    (Consider location/radiation/quality/duration/timing/severity/associated sxs/prior treatment) HPI Comments: Patient presents with complaints of pain in the abdomen for the past two weeks.  She says that she was diagnosed last year with ulcers and this feels the same.  No fevers or chills.  No n/v/d.    Patient is a 41 y.o. female presenting with abdominal pain. The history is provided by the patient.  Abdominal Pain The primary symptoms of the illness include abdominal pain and nausea. The primary symptoms of the illness do not include fever, vomiting or diarrhea. The onset of the illness was gradual. The problem has been gradually worsening.    Past Medical History  Diagnosis Date  . Migraine   . GERD (gastroesophageal reflux disease)   . Ulcer   . Toothache   . Chronic pain     Past Surgical History  Procedure Date  . Myringotomy     No family history on file.  History  Substance Use Topics  . Smoking status: Current Every Day Smoker -- 0.2 packs/day    Types: Cigarettes  . Smokeless tobacco: Never Used  . Alcohol Use: Yes     Comment: occ    OB History    Grav Para Term Preterm Abortions TAB SAB Ect Mult Living                  Review of Systems  Constitutional: Negative for fever.  Gastrointestinal: Positive for nausea and abdominal pain. Negative for vomiting and diarrhea.  All other systems reviewed and are negative.    Allergies  Hydrocodone-acetaminophen  Home Medications   Current Outpatient Rx  Name  Route  Sig  Dispense  Refill  . SUMATRIPTAN SUCCINATE 6 MG/0.5ML [place] SOLN   Subcutaneous   Inject 6 mg into the skin every 2 (two) hours as needed. F         . AMITRIPTYLINE HCL 50 MG PO TABS   Oral   Take 100 mg by mouth at bedtime.          . BC  HEADACHE POWDER PO   Oral   Take 1 packet by mouth 5 (five) times daily as needed. For headache and pain         . ESOMEPRAZOLE MAGNESIUM 40 MG PO CPDR   Oral   Take 40 mg by mouth 2 (two) times daily.             BP 111/66  Pulse 92  Temp 98.2 F (36.8 C) (Oral)  Resp 16  Ht 5\' 5"  (1.651 m)  Wt 159 lb (72.122 kg)  BMI 26.46 kg/m2  SpO2 100%  LMP 05/04/2012  Physical Exam  Nursing note and vitals reviewed. Constitutional: She is oriented to person, place, and time. She appears well-developed and well-nourished. No distress.  HENT:  Head: Normocephalic and atraumatic.  Neck: Normal range of motion. Neck supple.  Cardiovascular: Normal rate and regular rhythm.  Exam reveals no gallop and no friction rub.   No murmur heard. Pulmonary/Chest: Effort normal and breath sounds normal. No respiratory distress. She has no wheezes.  Abdominal: Soft. Bowel sounds are normal. She exhibits no distension. There is no tenderness.  Musculoskeletal: Normal range of motion.  Neurological: She is alert and oriented to person, place,  and time.  Skin: Skin is warm and dry. She is not diaphoretic.    ED Course  Procedures (including critical care time)   Labs Reviewed  PREGNANCY, URINE  URINALYSIS, ROUTINE W REFLEX MICROSCOPIC  CBC WITH DIFFERENTIAL  COMPREHENSIVE METABOLIC PANEL  LIPASE, BLOOD   No results found.   No diagnosis found.    MDM  The [place] is negative and labs are unremarkable with the exception of anemia which she has had before.  She will be discharged with prilosec, follow up with pcp if not improving.          Geoffery Lyons, MD 05/18/12 [phone]

## 2012-07-08 ENCOUNTER — Emergency Department (HOSPITAL_COMMUNITY)
Admission: EM | Admit: 2012-07-08 | Discharge: 2012-07-08 | Disposition: A | Discharge status: Home / Self Care | Payer: Managed Care, Other (non HMO) | Attending: Emergency Medicine | Admitting: Emergency Medicine

## 2012-07-08 ENCOUNTER — Emergency Department (HOSPITAL_COMMUNITY): Payer: Managed Care, Other (non HMO)

## 2012-07-08 ENCOUNTER — Encounter (HOSPITAL_COMMUNITY): Payer: Self-pay | Admitting: *Deleted

## 2012-07-08 DIAGNOSIS — F17200 Nicotine dependence, unspecified, uncomplicated: Secondary | ICD-10-CM | POA: Insufficient documentation

## 2012-07-08 DIAGNOSIS — Z720 Tobacco use: Secondary | ICD-10-CM

## 2012-07-08 DIAGNOSIS — R05 Cough: Secondary | ICD-10-CM

## 2012-07-08 DIAGNOSIS — G43909 Migraine, unspecified, not intractable, without status migrainosus: Secondary | ICD-10-CM | POA: Insufficient documentation

## 2012-07-08 DIAGNOSIS — R059 Cough, unspecified: Secondary | ICD-10-CM | POA: Insufficient documentation

## 2012-07-08 DIAGNOSIS — R071 Chest pain on breathing: Secondary | ICD-10-CM | POA: Insufficient documentation

## 2012-07-08 DIAGNOSIS — J209 Acute bronchitis, unspecified: Secondary | ICD-10-CM | POA: Insufficient documentation

## 2012-07-08 DIAGNOSIS — K219 Gastro-esophageal reflux disease without esophagitis: Secondary | ICD-10-CM | POA: Insufficient documentation

## 2012-07-08 DIAGNOSIS — Z8719 Personal history of other diseases of the digestive system: Secondary | ICD-10-CM | POA: Insufficient documentation

## 2012-07-08 DIAGNOSIS — Z79899 Other long term (current) drug therapy: Secondary | ICD-10-CM | POA: Insufficient documentation

## 2012-07-08 DIAGNOSIS — Z8711 Personal history of peptic ulcer disease: Secondary | ICD-10-CM | POA: Insufficient documentation

## 2012-07-08 MED ORDER — HYDROCOD POLST-CHLORPHEN POLST 10-8 MG/5ML PO LQCR
5.0000 mL | Freq: Once | ORAL | Status: AC
  Administered 2012-07-08: 5 mL via ORAL
  Filled 2012-07-08: qty 5

## 2012-07-08 MED ORDER — AZITHROMYCIN 250 MG PO TABS
500.0000 mg | ORAL_TABLET | Freq: Once | ORAL | Status: AC
  Administered 2012-07-08: 500 mg via ORAL
  Filled 2012-07-08: qty 2

## 2012-07-08 MED ORDER — ONDANSETRON HCL 4 MG PO TABS: 4.0000 mg | ORAL_TABLET | Freq: Four times a day (QID) | ORAL | Status: DC

## 2012-07-08 MED ORDER — AZITHROMYCIN 250 MG PO TABS: ORAL_TABLET | ORAL | Status: DC

## 2012-07-08 MED ORDER — HYDROCOD POLST-CHLORPHEN POLST 10-8 MG/5ML PO LQCR: 5.0000 mL | Freq: Two times a day (BID) | ORAL | Status: DC | PRN

## 2012-07-08 MED ORDER — ALBUTEROL SULFATE HFA 108 (90 BASE) MCG/ACT IN AERS: 2.0000 | INHALATION_SPRAY | RESPIRATORY_TRACT | Status: DC | PRN

## 2012-07-08 MED ORDER — ALBUTEROL SULFATE HFA 108 (90 BASE) MCG/ACT IN AERS
2.0000 | INHALATION_SPRAY | RESPIRATORY_TRACT | Status: DC | PRN
  Administered 2012-07-08: 2 via RESPIRATORY_TRACT
  Filled 2012-07-08: qty 6.7

## 2012-07-08 MED ORDER — ONDANSETRON 8 MG PO TBDP
8.0000 mg | ORAL_TABLET | Freq: Once | ORAL | Status: AC
  Administered 2012-07-08: 8 mg via ORAL
  Filled 2012-07-08: qty 1

## 2012-07-08 NOTE — ED Notes (Signed)
Pt c/o cough x 2 wks; right back pain; no fever

## 2012-07-08 NOTE — ED Provider Notes (Signed)
History     CSN: [id number]  Arrival date & time 07/08/12  0206   First MD Initiated Contact with Patient 07/08/12 0230      Chief Complaint  Patient presents with  . Cough    (Consider location/radiation/quality/duration/timing/severity/associated sxs/prior treatment) HPI 42-year-old female presents to emergency room complaining of persistent cough for the last 2 weeks. Cough is worse when lying down. Patient is a pack-a-day smoker. She denies any fever or chills. Cough is dry, hacking, nonproductive. She reports she has chest wall pain secondary to all the coughing. She reports her son had similar infection but his has cleared up. She denies history of bronchitis, COPD, asthma. She reports she has cut back on her smoking due to the coughing. She has tried over-the-counter NyQuil without improvement in symptoms.  Past Medical History  Diagnosis Date  . Migraine   . GERD (gastroesophageal reflux disease)   . Ulcer   . Toothache   . Chronic pain     Past Surgical History  Procedure Date  . Myringotomy     History reviewed. No pertinent family history.  History  Substance Use Topics  . Smoking status: Current Every Day Smoker -- 0.2 packs/day    Types: Cigarettes  . Smokeless tobacco: Never Used  . Alcohol Use: Yes     Comment: occ    OB History    Grav Para Term Preterm Abortions TAB SAB Ect Mult Living                  Review of Systems  See History of Present Illness; otherwise all other systems are reviewed and negative Allergies  Hydrocodone-acetaminophen Causes nausea and vomiting Home Medications   Current Outpatient Rx  Name  Route  Sig  Dispense  Refill  . AMITRIPTYLINE HCL 50 MG PO TABS   Oral   Take 100 mg by mouth at bedtime.          . BC HEADACHE POWDER PO   Oral   Take 1 packet by mouth 5 (five) times daily as needed. For headache and pain         . ESOMEPRAZOLE MAGNESIUM 40 MG PO CPDR   Oral   Take 40 mg by mouth 2 (two) times  daily.           [name] OMEPRAZOLE 20 MG PO CPDR   Oral   Take 1 capsule (20 mg total) by mouth 2 (two) times daily.   60 capsule   0   . PSEUDOEPH-DOXYLAMINE-DM-APAP 60-7.11-12-998 MG/30ML PO LIQD   Oral   Take 30 mLs by mouth every evening.         . SUMATRIPTAN SUCCINATE 6 MG/0.5ML [place] SOLN   Subcutaneous   Inject 6 mg into the skin every 2 (two) hours as needed. F         . ALBUTEROL SULFATE HFA 108 (90 BASE) MCG/ACT IN AERS   Inhalation   Inhale 2 puffs into the lungs every 4 (four) hours as needed for wheezing or shortness of breath (cough).   1 Inhaler   0   . AZITHROMYCIN 250 MG PO TABS      Take one tab q day for the next 4 days, starting Friday   4 each   0   . HYDROCOD POLST-CPM POLST ER 10-8 MG/5ML PO LQCR   Oral   Take 5 mLs by mouth every 12 (twelve) hours as needed (cough).   140 mL   0   .  ONDANSETRON HCL 4 MG PO TABS   Oral   Take 1 tablet (4 mg total) by mouth every 6 (six) hours. PRN nausea/vomiting   12 tablet   0     BP 105/66  Pulse 84  Temp 97.6 F (36.4 C)  Resp 16  SpO2 98%  LMP 06/24/2012  Physical Exam  Nursing note and vitals reviewed. Constitutional: She is oriented to person, place, and time. She appears well-developed and well-nourished.  HENT:  Head: Normocephalic and atraumatic.  Right Ear: External ear normal.  Left Ear: External ear normal.  Nose: Nose normal.  Mouth/Throat: Oropharynx is clear and moist.  Eyes: Conjunctivae normal and EOM are normal. Pupils are equal, round, and reactive to light.  Neck: Normal range of motion. Neck supple. No JVD present. No tracheal deviation present. No thyromegaly present.  Cardiovascular: Normal rate, regular rhythm, normal heart sounds and intact distal pulses.  Exam reveals no gallop and no friction rub.   No murmur heard. Pulmonary/Chest: Effort normal and breath sounds normal. No stridor. No respiratory distress. She has no wheezes. She has no rales. She exhibits no  tenderness.       Patient with persistent dry hacking cough otherwise normal exam  Abdominal: Soft. Bowel sounds are normal. She exhibits no distension and no mass. There is no tenderness. There is no rebound and no guarding.  Musculoskeletal: Normal range of motion. She exhibits no edema and no tenderness.  Lymphadenopathy:    She has no cervical adenopathy.  Neurological: She is alert and oriented to person, place, and time. She exhibits normal muscle tone. Coordination normal.  Skin: Skin is warm and dry. No rash noted. No erythema. No pallor.  Psychiatric: She has a normal mood and affect. Her behavior is normal. Judgment and thought content normal.    ED Course  Procedures (including critical care time)  Labs Reviewed - No data to display Dg Chest 2 View  07/08/2012  *RADIOLOGY REPORT*  Clinical Data: Chest pain and cough.  CHEST - 2 VIEW  Comparison: Chest radiograph performed 03/22/2009  Findings: The lungs are well-aerated and clear.  There is no evidence of focal opacification, pleural effusion or pneumothorax.  The heart is normal in size; the mediastinal contour is within normal limits.  No acute osseous abnormalities are seen.  IMPRESSION: No acute cardiopulmonary process seen.   Original Report Authenticated By: Tonia Ghent, M.D.      1. Cough   2. Acute bronchitis   3. Tobacco abuse       MDM   42 year old female with cough for 2 weeks. Suspect bronchitis.   chest x-ray unremarkable. Will start on albuterol, Zithromax and Tussionex. Followup with her primary care DrMarland Kitchen        Olivia Mackie, MD 07/08/12 [phone]

## 2012-07-12 ENCOUNTER — Emergency Department (HOSPITAL_BASED_OUTPATIENT_CLINIC_OR_DEPARTMENT_OTHER)
Admission: EM | Admit: 2012-07-12 | Discharge: 2012-07-12 | Disposition: A | Discharge status: Home / Self Care | Payer: Managed Care, Other (non HMO) | Attending: Emergency Medicine | Admitting: Emergency Medicine

## 2012-07-12 ENCOUNTER — Emergency Department (HOSPITAL_BASED_OUTPATIENT_CLINIC_OR_DEPARTMENT_OTHER): Payer: Managed Care, Other (non HMO)

## 2012-07-12 ENCOUNTER — Encounter (HOSPITAL_BASED_OUTPATIENT_CLINIC_OR_DEPARTMENT_OTHER): Payer: Self-pay

## 2012-07-12 DIAGNOSIS — Z8679 Personal history of other diseases of the circulatory system: Secondary | ICD-10-CM | POA: Insufficient documentation

## 2012-07-12 DIAGNOSIS — K219 Gastro-esophageal reflux disease without esophagitis: Secondary | ICD-10-CM | POA: Insufficient documentation

## 2012-07-12 DIAGNOSIS — Z3202 Encounter for pregnancy test, result negative: Secondary | ICD-10-CM | POA: Insufficient documentation

## 2012-07-12 DIAGNOSIS — Z8709 Personal history of other diseases of the respiratory system: Secondary | ICD-10-CM | POA: Insufficient documentation

## 2012-07-12 DIAGNOSIS — Z87898 Personal history of other specified conditions: Secondary | ICD-10-CM | POA: Insufficient documentation

## 2012-07-12 DIAGNOSIS — F17200 Nicotine dependence, unspecified, uncomplicated: Secondary | ICD-10-CM | POA: Insufficient documentation

## 2012-07-12 DIAGNOSIS — J069 Acute upper respiratory infection, unspecified: Secondary | ICD-10-CM | POA: Insufficient documentation

## 2012-07-12 DIAGNOSIS — R0982 Postnasal drip: Secondary | ICD-10-CM | POA: Insufficient documentation

## 2012-07-12 DIAGNOSIS — Z79899 Other long term (current) drug therapy: Secondary | ICD-10-CM | POA: Insufficient documentation

## 2012-07-12 DIAGNOSIS — J3489 Other specified disorders of nose and nasal sinuses: Secondary | ICD-10-CM | POA: Insufficient documentation

## 2012-07-12 MED ORDER — BENZONATATE 100 MG PO CAPS: 100.0000 mg | ORAL_CAPSULE | Freq: Three times a day (TID) | ORAL | Status: DC

## 2012-07-12 MED ORDER — FLUTICASONE PROPIONATE 50 MCG/ACT NA SUSP: 2.0000 | Freq: Every day | NASAL | Status: DC

## 2012-07-12 MED ORDER — DESLORATADINE 5 MG PO TABS: 5.0000 mg | ORAL_TABLET | Freq: Every day | ORAL | Status: DC

## 2012-07-12 MED ORDER — NAPROXEN 375 MG PO TABS: 375.0000 mg | ORAL_TABLET | Freq: Two times a day (BID) | ORAL | Status: DC

## 2012-07-12 MED ORDER — IBUPROFEN 800 MG PO TABS
800.0000 mg | ORAL_TABLET | Freq: Once | ORAL | Status: DC
  Filled 2012-07-12: qty 1

## 2012-07-12 NOTE — ED Notes (Signed)
Patient here with persistent cough and congestion x 3 weeks. Seen at [facility] for same and diagnosed with bronchitis, taking meds as prescribed.

## 2012-07-12 NOTE — ED Notes (Signed)
Pt states "Ibuprofen doesn't work for me, I don't won't it." MD notified of patient's refusal.

## 2012-07-12 NOTE — ED Provider Notes (Signed)
History     CSN: [id number]  Arrival date & time 07/12/12  0039   First MD Initiated Contact with Patient 07/12/12 0211      Chief Complaint  Patient presents with  . cough, congestion     (Consider location/radiation/quality/duration/timing/severity/associated sxs/prior treatment) Patient is a 42 y.o. female presenting with cough. The history is provided by the patient.  Cough This is a new problem. The current episode started more than 1 week ago. The problem occurs constantly. The problem has not changed since onset.The cough is non-productive. Pertinent negatives include no chest pain, no chills, no sweats, no weight loss, no myalgias, no shortness of breath and no wheezing. Treatments tried: z pak just finshed. The treatment provided no relief. She is a smoker. Her past medical history does not include pneumonia.    Past Medical History  Diagnosis Date  . Migraine   . GERD (gastroesophageal reflux disease)   . Ulcer   . Toothache   . Chronic pain   . Bronchitis     Past Surgical History  Procedure Date  . Myringotomy     No family history on file.  History  Substance Use Topics  . Smoking status: Current Every Day Smoker -- 0.2 packs/day    Types: Cigarettes  . Smokeless tobacco: Never Used  . Alcohol Use: Yes     Comment: occ    OB History    Grav Para Term Preterm Abortions TAB SAB Ect Mult Living                  Review of Systems  Constitutional: Negative for chills and weight loss.  HENT: Positive for congestion.   Respiratory: Positive for cough. Negative for shortness of breath and wheezing.   Cardiovascular: Negative for chest pain, palpitations and leg swelling.  Musculoskeletal: Negative for myalgias.  All other systems reviewed and are negative.    Allergies  Hydrocodone-acetaminophen  Home Medications   Current Outpatient Rx  Name  Route  Sig  Dispense  Refill  . ALBUTEROL SULFATE HFA 108 (90 BASE) MCG/ACT IN AERS   Inhalation  Inhale 2 puffs into the lungs every 4 (four) hours as needed for wheezing or shortness of breath (cough).   1 Inhaler   0   . AMITRIPTYLINE HCL 50 MG PO TABS   Oral   Take 100 mg by mouth at bedtime.          . BC HEADACHE POWDER PO   Oral   Take 1 packet by mouth 5 (five) times daily as needed. For headache and pain         . AZITHROMYCIN 250 MG PO TABS      Take one tab q day for the next 4 days, starting Friday   4 each   0   . BENZONATATE 100 MG PO CAPS   Oral   Take 1 capsule (100 mg total) by mouth every 8 (eight) hours.   21 capsule   0   . HYDROCOD POLST-CPM POLST ER 10-8 MG/5ML PO LQCR   Oral   Take 5 mLs by mouth every 12 (twelve) hours as needed (cough).   140 mL   0   . DESLORATADINE 5 MG PO TABS   Oral   Take 1 tablet (5 mg total) by mouth daily.   7 tablet   0   . ESOMEPRAZOLE MAGNESIUM 40 MG PO CPDR   Oral   Take 40 mg by mouth 2 (two) times  daily.           [name] FLUTICASONE PROPIONATE 50 MCG/ACT NA SUSP   Nasal   Place 2 sprays into the nose daily.   16 g   0   . NAPROXEN 375 MG PO TABS   Oral   Take 1 tablet (375 mg total) by mouth 2 (two) times daily.   20 tablet   0   . OMEPRAZOLE 20 MG PO CPDR   Oral   Take 1 capsule (20 mg total) by mouth 2 (two) times daily.   60 capsule   0   . ONDANSETRON HCL 4 MG PO TABS   Oral   Take 1 tablet (4 mg total) by mouth every 6 (six) hours. PRN nausea/vomiting   12 tablet   0   . PSEUDOEPH-DOXYLAMINE-DM-APAP 60-7.11-12-998 MG/30ML PO LIQD   Oral   Take 30 mLs by mouth every evening.         . SUMATRIPTAN SUCCINATE 6 MG/0.5ML [place] SOLN   Subcutaneous   Inject 6 mg into the skin every 2 (two) hours as needed. F           BP 112/68  Pulse 89  Temp 98.1 F (36.7 C) (Oral)  Resp 20  Ht 5\' 5"  (1.651 m)  Wt 156 lb (70.761 kg)  BMI 25.96 kg/m2  SpO2 100%  LMP 06/24/2012  Physical Exam  Constitutional: She is oriented to person, place, and time. She appears well-developed and  well-nourished. No distress.  HENT:  Head: Normocephalic and atraumatic.  Mouth/Throat: No oropharyngeal exudate.       Cobblestoning and clear drainage consistent with post nasal drip  Eyes: Conjunctivae normal are normal. Pupils are equal, round, and reactive to light.  Neck: Normal range of motion. Neck supple.  Cardiovascular: Normal rate, regular rhythm and intact distal pulses.   Pulmonary/Chest: Effort normal and breath sounds normal. No respiratory distress. She has no wheezes. She has no rales. She exhibits no tenderness.  Abdominal: Soft. Bowel sounds are normal.  Musculoskeletal: Normal range of motion. She exhibits no edema.  Lymphadenopathy:    She has no cervical adenopathy.  Neurological: She is alert and oriented to person, place, and time.  Skin: Skin is warm and dry.  Psychiatric: She has a normal mood and affect.    ED Course  Procedures (including critical care time)   Labs Reviewed  PREGNANCY, URINE   Dg Chest 2 View  07/12/2012  *RADIOLOGY REPORT*  Clinical Data: Cough  CHEST - 2 VIEW  Comparison: 07/08/2012  Findings: Lungs are clear. No pleural effusion or pneumothorax. The cardiomediastinal contours are within normal limits. The visualized bones and soft tissues are without significant appreciable abnormality.  IMPRESSION: No radiographic evidence of acute cardiopulmonary process.   Original Report Authenticated By: Jearld Lesch, M.D.      1. Post-nasal drip   2. Viral URI       MDM  Perc negative wells 0  Will treat for post nasal drip.  Z pak is still active in patient's system no indication for further abx        Lisanne Ponce Smitty Cords, MD 07/12/12 0865

## 2012-08-03 ENCOUNTER — Emergency Department (HOSPITAL_COMMUNITY)
Admission: EM | Admit: 2012-08-03 | Discharge: 2012-08-04 | Disposition: A | Discharge status: Home / Self Care | Payer: Managed Care, Other (non HMO) | Attending: Emergency Medicine | Admitting: Emergency Medicine

## 2012-08-03 ENCOUNTER — Encounter (HOSPITAL_COMMUNITY): Payer: Self-pay | Admitting: Emergency Medicine

## 2012-08-03 DIAGNOSIS — Z8679 Personal history of other diseases of the circulatory system: Secondary | ICD-10-CM | POA: Insufficient documentation

## 2012-08-03 DIAGNOSIS — J42 Unspecified chronic bronchitis: Secondary | ICD-10-CM | POA: Insufficient documentation

## 2012-08-03 DIAGNOSIS — F17200 Nicotine dependence, unspecified, uncomplicated: Secondary | ICD-10-CM | POA: Insufficient documentation

## 2012-08-03 DIAGNOSIS — Z872 Personal history of diseases of the skin and subcutaneous tissue: Secondary | ICD-10-CM | POA: Insufficient documentation

## 2012-08-03 DIAGNOSIS — Z79899 Other long term (current) drug therapy: Secondary | ICD-10-CM | POA: Insufficient documentation

## 2012-08-03 DIAGNOSIS — G8929 Other chronic pain: Secondary | ICD-10-CM | POA: Insufficient documentation

## 2012-08-03 DIAGNOSIS — M545 Low back pain, unspecified: Secondary | ICD-10-CM | POA: Insufficient documentation

## 2012-08-03 DIAGNOSIS — K219 Gastro-esophageal reflux disease without esophagitis: Secondary | ICD-10-CM | POA: Insufficient documentation

## 2012-08-03 NOTE — ED Notes (Signed)
Pt c/o cough and chest pain x's 1 month.  St's was dx with bronchitis and took her meds but not feeling any better

## 2012-08-04 ENCOUNTER — Emergency Department (HOSPITAL_COMMUNITY): Payer: Managed Care, Other (non HMO)

## 2012-08-04 MED ORDER — SULFAMETHOXAZOLE-TRIMETHOPRIM 800-160 MG PO TABS: 1.0000 | ORAL_TABLET | Freq: Two times a day (BID) | ORAL | Status: DC

## 2012-08-04 MED ORDER — ALBUTEROL SULFATE HFA 108 (90 BASE) MCG/ACT IN AERS
2.0000 | INHALATION_SPRAY | RESPIRATORY_TRACT | Status: DC | PRN
  Administered 2012-08-04: 2 via RESPIRATORY_TRACT
  Filled 2012-08-04: qty 6.7

## 2012-08-04 MED ORDER — SULFAMETHOXAZOLE-TMP DS 800-160 MG PO TABS
1.0000 | ORAL_TABLET | Freq: Once | ORAL | Status: AC
  Administered 2012-08-04: 1 via ORAL
  Filled 2012-08-04: qty 1

## 2012-08-04 NOTE — ED Provider Notes (Signed)
Medical screening examination/treatment/procedure(s) were performed by non-physician practitioner and as supervising physician I was immediately available for consultation/collaboration.  Shirleymae Hauth Lytle Michaels, MD 08/04/12 [phone]

## 2012-08-04 NOTE — ED Provider Notes (Signed)
History     CSN: [id number]  Arrival date & time 08/03/12  2313   First MD Initiated Contact with Patient 08/04/12 0054      Chief Complaint  Patient presents with  . Cough    (Consider location/radiation/quality/duration/timing/severity/associated sxs/prior treatment) HPI  Patient presents to the emergency department with complaints of cough that is nonproductive it has been persisting for one month. She's been treated with azithromycin, Tussionex, Flonase, albuterol inhaler. She says that it got better after the antibiotics but now it's come back. She is having some throbbing low back pain from all the coughing. [facility] Dr[facility] she is going to see them yet. He body aches, nasal congestion, rhinorrhea, shortness of breath, headache, sinus pressure, neck pain, sore throat, sneezing, chills. His past medical history just positive for migraine, GERD, posterior, today, chronic pain and bronchitis. She denies having history of asthma or COPD. She denies being around other sick contacts. She's concerned that she may have pneumonia and is concerned about her low back hurting. nad vss     Past Medical History  Diagnosis Date  . Migraine   . GERD (gastroesophageal reflux disease)   . Ulcer   . Toothache   . Chronic pain   . Bronchitis     Past Surgical History  Procedure Laterality Date  . Myringotomy      No family history on file.  History  Substance Use Topics  . Smoking status: Current Every Day Smoker -- 0.25 packs/day    Types: Cigarettes  . Smokeless tobacco: Never Used  . Alcohol Use: Yes     Comment: occ    OB History   Grav Para Term Preterm Abortions TAB SAB Ect Mult Living                  Review of Systems  Review of Systems  Gen: no weight loss, fevers, chills, night sweats  Eyes: no discharge or drainage, no occular pain or visual changes  Nose: no epistaxis or rhinorrhea  Mouth: no dental pain, no sore throat  Neck:  no neck pain  Lungs:No wheezing,  or hemoptysis + coughing CV: no chest pain, palpitations, dependent edema or orthopnea  Abd: no abdominal pain, nausea, vomiting  GU: no dysuria or gross hematuria  MSK:  No abnormalities  Neuro: no headache, no focal neurologic deficits  Skin: no abnormalities Psyche: negative.    Allergies  Hydrocodone-acetaminophen  Home Medications   Current Outpatient Rx  Name  Route  Sig  Dispense  Refill  . albuterol (PROVENTIL HFA;VENTOLIN HFA) 108 (90 BASE) MCG/ACT inhaler   Inhalation   Inhale 2 puffs into the lungs every 4 (four) hours as needed for wheezing or shortness of breath (cough).   1 Inhaler   0   . chlorpheniramine-HYDROcodone (TUSSIONEX) 10-8 MG/5ML LQCR   Oral   Take 5 mLs by mouth every 12 (twelve) hours as needed (cough).   140 mL   0   . esomeprazole (NEXIUM) 40 MG capsule   Oral   Take 40 mg by mouth 2 (two) times daily.           . SUMAtriptan (IMITREX) 6 MG/0.5ML SOLN injection   Subcutaneous   Inject 6 mg into the skin every 2 (two) hours as needed for headache. F         . sulfamethoxazole-trimethoprim (SEPTRA DS) 800-160 MG per tablet   Oral   Take 1 tablet by mouth every  12 (twelve) hours.   10 tablet   0     BP 105/69  Pulse 90  Temp(Src) 99.2 F (37.3 C) (Oral)  Resp 20  SpO2 97%  LMP 06/27/2012  Physical Exam  Nursing note and vitals reviewed. Constitutional: She appears well-developed and well-nourished. No distress.  HENT:  Head: Normocephalic and atraumatic.  Eyes: Pupils are equal, round, and reactive to light.  Neck: Normal range of motion. Neck supple.  Cardiovascular: Normal rate and regular rhythm.   Pulmonary/Chest: Effort normal. No respiratory distress. She has no wheezes. She has no rales. She exhibits no tenderness.  Pt coughs frequently during exam. No sputum  Abdominal: Soft.  Neurological: She is alert.  Skin: Skin is warm and dry.    ED Course  Procedures (including  critical care time)  Labs Reviewed - No data to display Dg Chest 2 View  08/04/2012  *RADIOLOGY REPORT*  Clinical Data: Cough, short of breath  CHEST - 2 VIEW  Comparison: Prior chest x-ray 07/12/2012  Findings: The lungs are well-aerated and free from pulmonary edema, focal airspace consolidation or pulmonary nodule.  Cardiac and mediastinal contours are within normal limits.  No pneumothorax, or pleural effusion. No acute osseous findings.  IMPRESSION:  No acute cardiopulmonary disease.   Original Report Authenticated By: Malachy Moan, M.D.      1. Chronic bronchitis       MDM  She given a course of antibiotics at the beginning of warts of her illness. Which helped her infection transiently but then it came back. She is coughing and he exam room. Ultra giving her another antibiotic as well as albuterol inhaler. The patient does have a primary care doctor I given her a work note for tomorrow she is to going to see her PCP for further workup evaluation treatment and management.  Pt has been advised of the symptoms that warrant their return to the ED. Patient has voiced understanding and has agreed to follow-up with the PCP or specialist.         Dorthula Matas, PA 08/04/12 [phone]

## 2012-08-11 ENCOUNTER — Encounter (HOSPITAL_BASED_OUTPATIENT_CLINIC_OR_DEPARTMENT_OTHER): Payer: Self-pay | Admitting: Emergency Medicine

## 2012-08-11 ENCOUNTER — Emergency Department (HOSPITAL_BASED_OUTPATIENT_CLINIC_OR_DEPARTMENT_OTHER)
Admission: EM | Admit: 2012-08-11 | Discharge: 2012-08-11 | Disposition: A | Discharge status: Home / Self Care | Payer: Managed Care, Other (non HMO) | Attending: Emergency Medicine | Admitting: Emergency Medicine

## 2012-08-11 DIAGNOSIS — G43909 Migraine, unspecified, not intractable, without status migrainosus: Secondary | ICD-10-CM | POA: Insufficient documentation

## 2012-08-11 DIAGNOSIS — K219 Gastro-esophageal reflux disease without esophagitis: Secondary | ICD-10-CM | POA: Insufficient documentation

## 2012-08-11 DIAGNOSIS — Z8719 Personal history of other diseases of the digestive system: Secondary | ICD-10-CM | POA: Insufficient documentation

## 2012-08-11 DIAGNOSIS — F17200 Nicotine dependence, unspecified, uncomplicated: Secondary | ICD-10-CM | POA: Insufficient documentation

## 2012-08-11 DIAGNOSIS — Z79899 Other long term (current) drug therapy: Secondary | ICD-10-CM | POA: Insufficient documentation

## 2012-08-11 DIAGNOSIS — Z8709 Personal history of other diseases of the respiratory system: Secondary | ICD-10-CM | POA: Insufficient documentation

## 2012-08-11 DIAGNOSIS — Z872 Personal history of diseases of the skin and subcutaneous tissue: Secondary | ICD-10-CM | POA: Insufficient documentation

## 2012-08-11 MED ORDER — SODIUM CHLORIDE 0.9 % IV BOLUS (SEPSIS)
500.0000 mL | Freq: Once | INTRAVENOUS | Status: AC
  Administered 2012-08-11: 500 mL via INTRAVENOUS

## 2012-08-11 MED ORDER — DIPHENHYDRAMINE HCL 50 MG/ML IJ SOLN
12.5000 mg | Freq: Once | INTRAMUSCULAR | Status: AC
  Administered 2012-08-11: 12.5 mg via INTRAVENOUS
  Filled 2012-08-11: qty 1

## 2012-08-11 MED ORDER — DEXAMETHASONE SODIUM PHOSPHATE 10 MG/ML IJ SOLN
10.0000 mg | Freq: Once | INTRAMUSCULAR | Status: AC
  Administered 2012-08-11: 10 mg via INTRAVENOUS
  Filled 2012-08-11: qty 1

## 2012-08-11 MED ORDER — METOCLOPRAMIDE HCL 5 MG/ML IJ SOLN
10.0000 mg | Freq: Once | INTRAMUSCULAR | Status: AC
  Administered 2012-08-11: 10 mg via INTRAVENOUS
  Filled 2012-08-11: qty 2

## 2012-08-11 MED ORDER — KETOROLAC TROMETHAMINE 30 MG/ML IJ SOLN
30.0000 mg | Freq: Once | INTRAMUSCULAR | Status: AC
Start: 2012-08-11 — End: 2012-08-11
  Administered 2012-08-11: 30 mg via INTRAVENOUS
  Filled 2012-08-11: qty 1

## 2012-08-11 MED ORDER — SODIUM CHLORIDE 0.9 % IV BOLUS (SEPSIS): 1000.0000 mL | Freq: Once | INTRAVENOUS | Status: DC

## 2012-08-11 NOTE — ED Notes (Signed)
Pt c/o headache x 45 min. Pt states symptoms are same as previous migraines

## 2012-08-11 NOTE — ED Notes (Signed)
Upon enter room, pt is sleeping.

## 2012-08-11 NOTE — ED Notes (Signed)
Blanket given and lights dimmed per pt request

## 2012-08-11 NOTE — ED Provider Notes (Signed)
History     CSN: [id number]  Arrival date & time 08/11/12  0232   First MD Initiated Contact with Patient 08/11/12 [phone]      Chief Complaint  Patient presents with  . Migraine    (Consider location/radiation/quality/duration/timing/severity/associated sxs/prior treatment) Patient is a 42 y.o. female presenting with migraines. The history is provided by the patient.  Migraine This is a recurrent problem. The current episode started less than 1 hour ago. The problem occurs constantly. The problem has not changed since onset.Associated symptoms include headaches. Pertinent negatives include no chest pain, no abdominal pain and no shortness of breath. Nothing aggravates the symptoms. She has tried nothing for the symptoms. The treatment provided no relief.  Not sudden onset not the worst HA of her life is out of imitrex  Past Medical History  Diagnosis Date  . Migraine   . GERD (gastroesophageal reflux disease)   . Ulcer   . Toothache   . Chronic pain   . Bronchitis     Past Surgical History  Procedure Laterality Date  . Myringotomy      No family history on file.  History  Substance Use Topics  . Smoking status: Current Every Day Smoker -- 0.25 packs/day    Types: Cigarettes  . Smokeless tobacco: Never Used  . Alcohol Use: Yes     Comment: occ    OB History   Grav Para Term Preterm Abortions TAB SAB Ect Mult Living                  Review of Systems  Eyes: Negative for visual disturbance.  Respiratory: Negative for shortness of breath.   Cardiovascular: Negative for chest pain.  Gastrointestinal: Negative for abdominal pain.  Neurological: Positive for headaches.  All other systems reviewed and are negative.    Allergies  Hydrocodone-acetaminophen  Home Medications   Current Outpatient Rx  Name  Route  Sig  Dispense  Refill  . albuterol (PROVENTIL HFA;VENTOLIN HFA) 108 (90 BASE) MCG/ACT inhaler   Inhalation   Inhale 2 puffs into the lungs every 4  (four) hours as needed for wheezing or shortness of breath (cough).   1 Inhaler   0   . chlorpheniramine-HYDROcodone (TUSSIONEX) 10-8 MG/5ML LQCR   Oral   Take 5 mLs by mouth every 12 (twelve) hours as needed (cough).   140 mL   0   . SUMAtriptan (IMITREX) 6 MG/0.5ML SOLN injection   Subcutaneous   Inject 6 mg into the skin every 2 (two) hours as needed for headache. F         . esomeprazole (NEXIUM) 40 MG capsule   Oral   Take 40 mg by mouth 2 (two) times daily.           [name] sulfamethoxazole-trimethoprim (SEPTRA DS) 800-160 MG per tablet   Oral   Take 1 tablet by mouth every 12 (twelve) hours.   10 tablet   0     BP 119/74  Pulse 89  Temp(Src) 97.6 F (36.4 C) (Oral)  Resp 18  Ht 5\' 5"  (1.651 m)  Wt 150 lb (68.04 kg)  BMI 24.96 kg/m2  SpO2 100%  LMP 06/27/2012  Physical Exam  Constitutional: She is oriented to person, place, and time. She appears well-developed and well-nourished. No distress.  HENT:  Head: Normocephalic and atraumatic.  Mouth/Throat: Oropharynx is clear and moist.  Eyes: Conjunctivae and EOM are normal. Pupils are equal, round, and reactive to light.  Neck: Normal range of  motion. Neck supple.  No meningeal signs  Cardiovascular: Normal rate, regular rhythm and intact distal pulses.   Pulmonary/Chest: Effort normal and breath sounds normal.  Abdominal: Soft. Bowel sounds are normal. There is no tenderness. There is no rebound and no guarding.  Musculoskeletal: Normal range of motion.  Neurological: She is alert and oriented to person, place, and time. She has normal reflexes. She displays normal reflexes. No cranial nerve deficit.  Skin: Skin is warm and dry.  Psychiatric: She has a normal mood and affect.    ED Course  Procedures (including critical care time)  Labs Reviewed - No data to display No results found.   No diagnosis found.    MDM  Sleeping soundly in room upon entrance arouses easily to tactile stimuli.  No  indication for imaging or LP at this time.  Follow up with your family doctor        Candas Deemer K Keyonte Cookston-Rasch, MD 08/11/12 [phone]

## 2012-08-13 ENCOUNTER — Emergency Department (HOSPITAL_COMMUNITY)
Admission: EM | Admit: 2012-08-13 | Discharge: 2012-08-13 | Disposition: A | Discharge status: Home / Self Care | Payer: Managed Care, Other (non HMO) | Attending: Emergency Medicine | Admitting: Emergency Medicine

## 2012-08-13 ENCOUNTER — Encounter (HOSPITAL_COMMUNITY): Payer: Self-pay | Admitting: *Deleted

## 2012-08-13 DIAGNOSIS — R51 Headache: Secondary | ICD-10-CM

## 2012-08-13 DIAGNOSIS — Z872 Personal history of diseases of the skin and subcutaneous tissue: Secondary | ICD-10-CM | POA: Insufficient documentation

## 2012-08-13 DIAGNOSIS — K219 Gastro-esophageal reflux disease without esophagitis: Secondary | ICD-10-CM | POA: Insufficient documentation

## 2012-08-13 DIAGNOSIS — F17200 Nicotine dependence, unspecified, uncomplicated: Secondary | ICD-10-CM | POA: Insufficient documentation

## 2012-08-13 DIAGNOSIS — Z79899 Other long term (current) drug therapy: Secondary | ICD-10-CM | POA: Insufficient documentation

## 2012-08-13 DIAGNOSIS — Z8709 Personal history of other diseases of the respiratory system: Secondary | ICD-10-CM | POA: Insufficient documentation

## 2012-08-13 DIAGNOSIS — G8929 Other chronic pain: Secondary | ICD-10-CM | POA: Insufficient documentation

## 2012-08-13 DIAGNOSIS — G43909 Migraine, unspecified, not intractable, without status migrainosus: Secondary | ICD-10-CM | POA: Insufficient documentation

## 2012-08-13 MED ORDER — SUMATRIPTAN SUCCINATE 6 MG/0.5ML ~~LOC~~ SOLN: SUBCUTANEOUS | Status: DC

## 2012-08-13 MED ORDER — METOCLOPRAMIDE HCL 5 MG/ML IJ SOLN
10.0000 mg | Freq: Once | INTRAMUSCULAR | Status: AC
  Administered 2012-08-13: 10 mg via INTRAVENOUS
  Filled 2012-08-13: qty 2

## 2012-08-13 MED ORDER — SUMATRIPTAN SUCCINATE 6 MG/0.5ML ~~LOC~~ SOLN
6.0000 mg | Freq: Once | SUBCUTANEOUS | Status: AC
  Administered 2012-08-13: 6 mg via SUBCUTANEOUS
  Filled 2012-08-13: qty 0.5

## 2012-08-13 MED ORDER — DIPHENHYDRAMINE HCL 50 MG/ML IJ SOLN
25.0000 mg | Freq: Once | INTRAMUSCULAR | Status: AC
  Administered 2012-08-13: 25 mg via INTRAVENOUS
  Filled 2012-08-13: qty 1

## 2012-08-13 MED ORDER — KETOROLAC TROMETHAMINE 30 MG/ML IJ SOLN
30.0000 mg | Freq: Once | INTRAMUSCULAR | Status: AC
  Administered 2012-08-13: 30 mg via INTRAVENOUS
  Filled 2012-08-13: qty 1

## 2012-08-13 NOTE — ED Notes (Addendum)
Pt seen in ED for same complaint yesterday, reports not given any rx. Pt has hx of migraines. Reports migraine x2-3 days. sensitivity to light and sound. Pain 10/10. Reports n/v, but no vomiting today.   Upon assessment pt reports LMP 1/12, denies chance of pregnancy, reports normal for her to skip periods.

## 2012-08-13 NOTE — ED Provider Notes (Signed)
History     CSN: [id number]  Arrival date & time 08/13/12  0711   First MD Initiated Contact with Patient 08/13/12 [phone]      Chief Complaint  Patient presents with  . Migraine    (Consider location/radiation/quality/duration/timing/severity/associated sxs/prior treatment) Patient is a 42 y.o. female presenting with migraines. The history is provided by the patient (the pt complains of a headache). No language interpreter was used.  Migraine This is a recurrent problem. The current episode started 6 to 12 hours ago. The problem occurs constantly. The problem has not changed since onset.Pertinent negatives include no chest pain, no abdominal pain and no headaches. Nothing aggravates the symptoms. Nothing relieves the symptoms.    Past Medical History  Diagnosis Date  . Migraine   . GERD (gastroesophageal reflux disease)   . Ulcer   . Toothache   . Chronic pain   . Bronchitis     Past Surgical History  Procedure Laterality Date  . Myringotomy      History reviewed. No pertinent family history.  History  Substance Use Topics  . Smoking status: Current Every Day Smoker -- 0.25 packs/day    Types: Cigarettes  . Smokeless tobacco: Never Used  . Alcohol Use: Yes     Comment: occ    OB History   Grav Para Term Preterm Abortions TAB SAB Ect Mult Living                  Review of Systems  Constitutional: Negative for fatigue.  HENT: Negative for congestion, sinus pressure and ear discharge.   Eyes: Negative for discharge.  Respiratory: Negative for cough.   Cardiovascular: Negative for chest pain.  Gastrointestinal: Negative for abdominal pain and diarrhea.  Genitourinary: Negative for frequency and hematuria.  Musculoskeletal: Negative for back pain.  Skin: Negative for rash.  Neurological: Negative for seizures and headaches.  Psychiatric/Behavioral: Negative for hallucinations.    Allergies  Hydrocodone-acetaminophen  Home Medications   Current  Outpatient Rx  Name  Route  Sig  Dispense  Refill  . albuterol (PROVENTIL HFA;VENTOLIN HFA) 108 (90 BASE) MCG/ACT inhaler   Inhalation   Inhale 2 puffs into the lungs every 4 (four) hours as needed for wheezing or shortness of breath (cough).   1 Inhaler   0   . chlorpheniramine-HYDROcodone (TUSSIONEX) 10-8 MG/5ML LQCR   Oral   Take 5 mLs by mouth every 12 (twelve) hours as needed (cough).   140 mL   0   . esomeprazole (NEXIUM) 40 MG capsule   Oral   Take 40 mg by mouth 2 (two) times daily.           [name] sulfamethoxazole-trimethoprim (SEPTRA DS) 800-160 MG per tablet   Oral   Take 1 tablet by mouth every 12 (twelve) hours.   10 tablet   0   . SUMAtriptan (IMITREX) 6 MG/0.5ML SOLN injection   Subcutaneous   Inject 6 mg into the skin every 2 (two) hours as needed for headache. F         . SUMAtriptan (IMITREX) 6 MG/0.5ML SOLN injection      Use once a day if needed for headache   10 vial   0     BP 116/81  Pulse 63  Temp(Src) 97.9 F (36.6 C) (Oral)  Resp 16  SpO2 100%  LMP 06/27/2012  Physical Exam  Constitutional: She is oriented to person, place, and time. She appears well-developed.  HENT:  Head: Normocephalic and atraumatic.  Eyes: Conjunctivae and EOM are normal. No scleral icterus.  Neck: Neck supple. No thyromegaly present.  Cardiovascular: Normal rate and regular rhythm.  Exam reveals no gallop and no friction rub.   No murmur heard. Pulmonary/Chest: No stridor. She has no wheezes. She has no rales. She exhibits no tenderness.  Abdominal: She exhibits no distension. There is no tenderness. There is no rebound.  Musculoskeletal: Normal range of motion. She exhibits no edema.  Lymphadenopathy:    She has no cervical adenopathy.  Neurological: She is oriented to person, place, and time. Coordination normal.  Skin: No rash noted. No erythema.  Psychiatric: She has a normal mood and affect. Her behavior is normal.    ED Course  Procedures (including  critical care time)  Labs Reviewed - No data to display No results found.   1. Headache    Pt improved with tx   MDM          Benny Lennert, MD 08/13/12 [phone]

## 2012-08-13 NOTE — ED Notes (Addendum)
Per Zamett  If can not get IV ok to give medication IM & done.

## 2012-08-14 ENCOUNTER — Encounter (HOSPITAL_COMMUNITY): Payer: Self-pay

## 2012-08-14 ENCOUNTER — Emergency Department (HOSPITAL_COMMUNITY)
Admission: EM | Admit: 2012-08-14 | Discharge: 2012-08-14 | Disposition: A | Discharge status: Home / Self Care | Payer: Managed Care, Other (non HMO) | Attending: Emergency Medicine | Admitting: Emergency Medicine

## 2012-08-14 DIAGNOSIS — Z8709 Personal history of other diseases of the respiratory system: Secondary | ICD-10-CM | POA: Insufficient documentation

## 2012-08-14 DIAGNOSIS — J3489 Other specified disorders of nose and nasal sinuses: Secondary | ICD-10-CM | POA: Insufficient documentation

## 2012-08-14 DIAGNOSIS — R51 Headache: Secondary | ICD-10-CM

## 2012-08-14 DIAGNOSIS — Z8711 Personal history of peptic ulcer disease: Secondary | ICD-10-CM | POA: Insufficient documentation

## 2012-08-14 DIAGNOSIS — G8929 Other chronic pain: Secondary | ICD-10-CM | POA: Insufficient documentation

## 2012-08-14 DIAGNOSIS — K219 Gastro-esophageal reflux disease without esophagitis: Secondary | ICD-10-CM | POA: Insufficient documentation

## 2012-08-14 DIAGNOSIS — J329 Chronic sinusitis, unspecified: Secondary | ICD-10-CM

## 2012-08-14 DIAGNOSIS — Z79899 Other long term (current) drug therapy: Secondary | ICD-10-CM | POA: Insufficient documentation

## 2012-08-14 DIAGNOSIS — Z8719 Personal history of other diseases of the digestive system: Secondary | ICD-10-CM | POA: Insufficient documentation

## 2012-08-14 DIAGNOSIS — F17200 Nicotine dependence, unspecified, uncomplicated: Secondary | ICD-10-CM | POA: Insufficient documentation

## 2012-08-14 DIAGNOSIS — H53149 Visual discomfort, unspecified: Secondary | ICD-10-CM | POA: Insufficient documentation

## 2012-08-14 MED ORDER — AMOXICILLIN-POT CLAVULANATE 875-125 MG PO TABS: 1.0000 | ORAL_TABLET | Freq: Two times a day (BID) | ORAL | Status: DC

## 2012-08-14 MED ORDER — SUMATRIPTAN SUCCINATE 6 MG/0.5ML ~~LOC~~ SOLN
6.0000 mg | Freq: Once | SUBCUTANEOUS | Status: AC
  Administered 2012-08-14: 6 mg via SUBCUTANEOUS
  Filled 2012-08-14: qty 0.5

## 2012-08-14 NOTE — ED Provider Notes (Signed)
History     CSN: [id number]  Arrival date & time 08/14/12  1112   First MD Initiated Contact with Patient 08/14/12 1114      Chief Complaint  Patient presents with  . Headache    (Consider location/radiation/quality/duration/timing/severity/associated sxs/prior treatment) HPI Comments: Patient with history of chronic headaches, has been seen by neurology at [facility] and tried on different medications, seen in emergency department yesterday and discharged home in improved condition -- presents with typical headache pain is around the left eye with associated documentation. Pain is described as throbbing. It has been severe since 11 pm last night. This morning upon waking, the patient noted that she had swelling of her left neck and tenderness. She has not had trouble or difficulty swallowing or breathing. No trouble with movement of the neck. Review of records show patient with similar complaints approximately one year ago. She had 2 soft tissue neck CTs performed which were negative. Pain is made worse with palpation. Nothing makes it better. Patient has had no success with headaches with Imitrex. Oxygen has helped in the past. No fevers, nausea, vomiting or diarrhea. No recent cold symptoms. Onset of symptoms gradual. Course is constant. Leg makes pain worse.  Patient is a 42 y.o. female presenting with headaches. The history is provided by the patient and medical records.  Headache Associated symptoms: congestion and photophobia   Associated symptoms: no fever, no nausea, no neck pain, no neck stiffness, no numbness, no sinus pressure and no vomiting     Past Medical History  Diagnosis Date  . Migraine   . GERD (gastroesophageal reflux disease)   . Ulcer   . Toothache   . Chronic pain   . Bronchitis     Past Surgical History  Procedure Laterality Date  . Myringotomy      History reviewed. No pertinent family history.  History  Substance Use Topics  . Smoking status:  Current Every Day Smoker -- 0.25 packs/day    Types: Cigarettes  . Smokeless tobacco: Never Used  . Alcohol Use: No     Comment: occ    OB History   Grav Para Term Preterm Abortions TAB SAB Ect Mult Living                  Review of Systems  Constitutional: Negative for fever.  HENT: Positive for congestion and rhinorrhea. Negative for neck pain, neck stiffness, dental problem and sinus pressure.   Eyes: Positive for photophobia and discharge (lacrimation). Negative for redness and visual disturbance.  Respiratory: Negative for shortness of breath.   Cardiovascular: Negative for chest pain.  Gastrointestinal: Negative for nausea and vomiting.  Musculoskeletal: Negative for gait problem.  Skin: Negative for rash.  Neurological: Positive for headaches. Negative for syncope, speech difficulty, weakness, light-headedness and numbness.  Psychiatric/Behavioral: Negative for confusion.    Allergies  Hydrocodone-acetaminophen  Home Medications   Current Outpatient Rx  Name  Route  Sig  Dispense  Refill  . albuterol (PROVENTIL HFA;VENTOLIN HFA) 108 (90 BASE) MCG/ACT inhaler   Inhalation   Inhale 2 puffs into the lungs every 4 (four) hours as needed for wheezing or shortness of breath (cough).   1 Inhaler   0   . chlorpheniramine-HYDROcodone (TUSSIONEX) 10-8 MG/5ML LQCR   Oral   Take 5 mLs by mouth every 12 (twelve) hours as needed (cough).   140 mL   0   . esomeprazole (NEXIUM) 40 MG capsule   Oral   Take 40 mg  by mouth 2 (two) times daily.           [name] sulfamethoxazole-trimethoprim (SEPTRA DS) 800-160 MG per tablet   Oral   Take 1 tablet by mouth every 12 (twelve) hours.   10 tablet   0   . SUMAtriptan (IMITREX) 6 MG/0.5ML SOLN injection   Subcutaneous   Inject 6 mg into the skin every 2 (two) hours as needed for headache. F         . SUMAtriptan (IMITREX) 6 MG/0.5ML SOLN injection      Use once a day if needed for headache   10 vial   0     BP 124/86   Pulse 75  Temp(Src) 98.2 F (36.8 C) (Oral)  Resp 16  SpO2 100%  LMP 07/31/2012  Physical Exam  Nursing note and vitals reviewed. Constitutional: She is oriented to person, place, and time. She appears well-developed and well-nourished.  HENT:  Head: Normocephalic and atraumatic.  Right Ear: Tympanic membrane, external ear and ear canal normal.  Left Ear: Tympanic membrane, external ear and ear canal normal.  Nose: Mucosal edema and rhinorrhea present. No epistaxis. Right sinus exhibits no maxillary sinus tenderness and no frontal sinus tenderness. Left sinus exhibits maxillary sinus tenderness and frontal sinus tenderness.  Mouth/Throat: Uvula is midline, oropharynx is clear and moist and mucous membranes are normal.  No mastoid tenderness or swelling, erythema  Eyes: Conjunctivae, EOM and lids are normal. Pupils are equal, round, and reactive to light. Right eye exhibits no nystagmus. Left eye exhibits no nystagmus.  Lacrimation during exam. Mild conjunctival injection.   Neck: Normal range of motion. Neck supple.  Patient tender along left lateral aspect of neck. Patient does not have pain to movement of head. No difficulty swallowing. No erythema or signs of cellulitis. No gross edema.  Cardiovascular: Normal rate and regular rhythm.   Pulmonary/Chest: Effort normal and breath sounds normal.  Abdominal: Soft. There is no tenderness.  Musculoskeletal:       Cervical back: She exhibits normal range of motion, no tenderness and no bony tenderness.  Neurological: She is alert and oriented to person, place, and time. She has normal strength and normal reflexes. No cranial nerve deficit or sensory deficit. She displays a negative Romberg sign. Coordination and gait normal. GCS eye subscore is 4. GCS verbal subscore is 5. GCS motor subscore is 6.  Skin: Skin is warm and dry.  Psychiatric: She has a normal mood and affect.    ED Course  Procedures (including critical care time)  Labs  Reviewed - No data to display No results found.   1. Headache   2. Sinusitis     11:33 AM Patient seen and examined. Previous ED visits reviewed. Work-up initiated. Medications ordered.   Vital signs reviewed and are as follows: Filed Vitals:   08/14/12 1119  BP: 124/86  Pulse: 75  Temp: 98.2 F (36.8 C)  Resp: 16   Patient discussed with and seen by Dr. Hyacinth Meeker. Degrees, no indication for CT scan. Will treat for sinusitis.  Patient with nearly complete resolution of headache after Imitrex. Will discharge home with course of Augmentin. Patient urged to return with worsening symptoms, fever, worsening head or neck pain, persistent vomiting, confusion or trouble walking, or if she has any other concerns. Patient verbalizes understanding and agrees with plan.    MDM  Headache: Similar to patient's previous chronic headaches. Resolved with Imitrex. Normal neurological exam.  Neck pain: Do not suspect meningitis, no meningeal  signs. Possible reactive lymphadenopathy. There is no redness or swelling to suggest cellulitis. There is no pain with movement of the head to suggest deep space tissue infection in neck. Patient appears well, nontoxic. No dental abscess.        Renne Crigler, [place] 08/14/12 1340

## 2012-08-14 NOTE — ED Notes (Signed)
Pt c/co headache and "the side of my neck is swollen." Pt has mild swelling noted to left side of neck. Pt states pain started yesterday and is also c/o left ear pain.

## 2012-08-14 NOTE — ED Provider Notes (Signed)
Vision presents with recurrent and ongoing left-sided headache which is above her left eye, below left eye and associated with photophobia. She has had very little to E., had some improvement yesterday when seen with medications  On exam the patient has mild tenderness over sinuses of the left frontal and maxillary sinuses, supple neck, no lymphadenopathy, no trismus or torticollis, no fver and no focal neuro findings as she has clear speech, normal coordinated movements and no visual changes, CN 3-12 intact.    Stable for outpt tx of ha with abx for sinusitis and pain medicine PRN  Medical screening examination/treatment/procedure(s) were conducted as a shared visit with non-physician practitioner(s) and myself.  I personally evaluated the patient during the encounter    Vida Roller, MD 08/15/12 [phone]

## 2012-08-18 ENCOUNTER — Emergency Department (HOSPITAL_BASED_OUTPATIENT_CLINIC_OR_DEPARTMENT_OTHER): Payer: Managed Care, Other (non HMO)

## 2012-08-18 ENCOUNTER — Encounter (HOSPITAL_BASED_OUTPATIENT_CLINIC_OR_DEPARTMENT_OTHER): Payer: Self-pay | Admitting: Family Medicine

## 2012-08-18 ENCOUNTER — Emergency Department (HOSPITAL_BASED_OUTPATIENT_CLINIC_OR_DEPARTMENT_OTHER)
Admission: EM | Admit: 2012-08-18 | Discharge: 2012-08-18 | Disposition: A | Discharge status: Home / Self Care | Payer: Managed Care, Other (non HMO) | Attending: Emergency Medicine | Admitting: Emergency Medicine

## 2012-08-18 DIAGNOSIS — Z8719 Personal history of other diseases of the digestive system: Secondary | ICD-10-CM | POA: Insufficient documentation

## 2012-08-18 DIAGNOSIS — Z792 Long term (current) use of antibiotics: Secondary | ICD-10-CM | POA: Insufficient documentation

## 2012-08-18 DIAGNOSIS — H9209 Otalgia, unspecified ear: Secondary | ICD-10-CM | POA: Insufficient documentation

## 2012-08-18 DIAGNOSIS — Z79899 Other long term (current) drug therapy: Secondary | ICD-10-CM | POA: Insufficient documentation

## 2012-08-18 DIAGNOSIS — J3489 Other specified disorders of nose and nasal sinuses: Secondary | ICD-10-CM | POA: Insufficient documentation

## 2012-08-18 DIAGNOSIS — R51 Headache: Secondary | ICD-10-CM | POA: Insufficient documentation

## 2012-08-18 DIAGNOSIS — F17200 Nicotine dependence, unspecified, uncomplicated: Secondary | ICD-10-CM | POA: Insufficient documentation

## 2012-08-18 DIAGNOSIS — Z8709 Personal history of other diseases of the respiratory system: Secondary | ICD-10-CM | POA: Insufficient documentation

## 2012-08-18 DIAGNOSIS — K219 Gastro-esophageal reflux disease without esophagitis: Secondary | ICD-10-CM | POA: Insufficient documentation

## 2012-08-18 DIAGNOSIS — Z8679 Personal history of other diseases of the circulatory system: Secondary | ICD-10-CM | POA: Insufficient documentation

## 2012-08-18 DIAGNOSIS — Z872 Personal history of diseases of the skin and subcutaneous tissue: Secondary | ICD-10-CM | POA: Insufficient documentation

## 2012-08-18 DIAGNOSIS — M542 Cervicalgia: Secondary | ICD-10-CM

## 2012-08-18 LAB — BASIC METABOLIC PANEL
BUN: 6 mg/dL (ref 6–23)
Chloride: 104 mEq/L (ref 96–112)
GFR calc Af Amer: >90 mL/min (ref >90)
GFR calc non Af Amer: >90 mL/min (ref >90)
Glucose, Bld: 95 mg/dL (ref 70–99)
Potassium: 3.5 mEq/L (ref 3.5–5.1)
Sodium: 138 mEq/L (ref 135–145)

## 2012-08-18 LAB — CBC WITH DIFFERENTIAL/PLATELET
Basophils Relative: 0 % (ref 0–1)
Hemoglobin: 9.5 g/dL — ABNORMAL LOW (ref 12.0–15.0)
Lymphs Abs: 1.8 10*3/uL (ref 0.7–4.0)
Monocytes Relative: 8 % (ref 3–12)
Neutro Abs: 5.1 10*3/uL (ref 1.7–7.7)
Neutrophils Relative %: 67 % (ref 43–77)
Platelets: 604 10*3/uL — ABNORMAL HIGH (ref 150–400)
RBC: 3.98 MIL/uL (ref 3.87–5.11)

## 2012-08-18 MED ORDER — TRAMADOL HCL 50 MG PO TABS: 50.0000 mg | ORAL_TABLET | Freq: Four times a day (QID) | ORAL | Status: DC | PRN

## 2012-08-18 MED ORDER — SODIUM CHLORIDE 0.9 % IV BOLUS (SEPSIS)
500.0000 mL | Freq: Once | INTRAVENOUS | Status: AC
  Administered 2012-08-18: 500 mL via INTRAVENOUS

## 2012-08-18 MED ORDER — NAPROXEN 500 MG PO TABS: 500.0000 mg | ORAL_TABLET | Freq: Two times a day (BID) | ORAL | Status: DC

## 2012-08-18 MED ORDER — SODIUM CHLORIDE 0.9 % IV SOLN: INTRAVENOUS | Status: DC

## 2012-08-18 MED ORDER — IOHEXOL 300 MG/ML  SOLN
76.0000 mL | Freq: Once | INTRAMUSCULAR | Status: AC | PRN
  Administered 2012-08-18: 76 mL via INTRAVENOUS

## 2012-08-18 MED ORDER — CYCLOBENZAPRINE HCL 10 MG PO TABS: 10.0000 mg | ORAL_TABLET | Freq: Two times a day (BID) | ORAL | Status: DC | PRN

## 2012-08-18 NOTE — ED Notes (Signed)
Pt c/o left sided facial and neck swelling and pain. Pt seen on 03/01 for same and taking amoxicillin for same.

## 2012-08-18 NOTE — ED Provider Notes (Addendum)
History     CSN: [id number]  Arrival date & time 08/18/12  0955   First MD Initiated Contact with Patient 08/18/12 1035      Chief Complaint  Patient presents with  . Facial Swelling    (Consider location/radiation/quality/duration/timing/severity/associated sxs/prior treatment) The history is provided by the patient.   patient with 2 week history of left-sided neck face and head pain. Has been seen 3 times before twice treated for migraine-type headache which she has a history of in the last time given a trial of Augmentin antibiotic. Notices made a difference. Patient feels as if the swelling along the left side of her face and her neck. The pain is anywhere from 2-8/10. Is made worse with movement of the neck. Described as a sharp ache and has swelling.  Past Medical History  Diagnosis Date  . Migraine   . GERD (gastroesophageal reflux disease)   . Ulcer   . Toothache   . Chronic pain   . Bronchitis     Past Surgical History  Procedure Laterality Date  . Myringotomy      No family history on file.  History  Substance Use Topics  . Smoking status: Current Every Day Smoker -- 0.25 packs/day    Types: Cigarettes  . Smokeless tobacco: Never Used  . Alcohol Use: No     Comment: occ    OB History   Grav Para Term Preterm Abortions TAB SAB Ect Mult Living                  Review of Systems  Constitutional: Negative for fever.  HENT: Positive for ear pain, congestion and neck pain. Negative for sore throat, trouble swallowing and voice change.   Eyes: Negative for redness.  Respiratory: Negative for cough, shortness of breath and stridor.   Cardiovascular: Negative for chest pain.  Gastrointestinal: Negative for nausea, vomiting and abdominal pain.  Genitourinary: Negative for dysuria.  Musculoskeletal: Negative for back pain.  Skin: Negative for rash.  Neurological: Positive for headaches. Negative for dizziness and speech difficulty.  Hematological: Negative  for adenopathy. Does not bruise/bleed easily.    Allergies  Hydrocodone-acetaminophen  Home Medications   Current Outpatient Rx  Name  Route  Sig  Dispense  Refill  . albuterol (PROVENTIL HFA;VENTOLIN HFA) 108 (90 BASE) MCG/ACT inhaler   Inhalation   Inhale 2 puffs into the lungs every 4 (four) hours as needed for wheezing or shortness of breath (cough).   1 Inhaler   0   . amoxicillin-clavulanate (AUGMENTIN) 875-125 MG per tablet   Oral   Take 1 tablet by mouth every 12 (twelve) hours.   20 tablet   0   . chlorpheniramine-HYDROcodone (TUSSIONEX) 10-8 MG/5ML LQCR   Oral   Take 5 mLs by mouth every 12 (twelve) hours as needed (cough).   140 mL   0   . cyclobenzaprine (FLEXERIL) 10 MG tablet   Oral   Take 1 tablet (10 mg total) by mouth 2 (two) times daily as needed for muscle spasms.   20 tablet   0   . esomeprazole (NEXIUM) 40 MG capsule   Oral   Take 40 mg by mouth 2 (two) times daily as needed (acid reflux).          . naproxen (NAPROSYN) 500 MG tablet   Oral   Take 1 tablet (500 mg total) by mouth 2 (two) times daily.   14 tablet   0   . sulfamethoxazole-trimethoprim (BACTRIM DS)  800-160 MG per tablet   Oral   Take 1 tablet by mouth 2 (two) times daily. Started 08-04-12 for upper respiratory infection . Has 2 doses left to take.         . SUMAtriptan (IMITREX) 6 MG/0.5ML SOLN injection   Subcutaneous   Inject 6 mg into the skin every 2 (two) hours as needed for migraine or headache. F         . traMADol (ULTRAM) 50 MG tablet   Oral   Take 1 tablet (50 mg total) by mouth every 6 (six) hours as needed for pain.   15 tablet   0     BP 119/64  Pulse 92  Temp(Src) 97.9 F (36.6 C) (Oral)  Resp 16  Ht 5\' 4"  (1.626 m)  Wt 145 lb (65.772 kg)  BMI 24.88 kg/m2  SpO2 100%  LMP 07/31/2012  Physical Exam  Nursing note and vitals reviewed. Constitutional: She is oriented to person, place, and time. She appears well-developed and well-nourished. No  distress.  HENT:  Head: Normocephalic and atraumatic.  Left Ear: External ear normal.  Mouth/Throat: Oropharynx is clear and moist.  Eyes: Conjunctivae and EOM are normal. Pupils are equal, round, and reactive to light. Left eye exhibits no discharge. No scleral icterus.  Neck: Normal range of motion. Neck supple. No JVD present. No tracheal deviation present. No thyromegaly present.  Tender to palpation along the left sternocleidomastoid muscle. No adenopathy no mass no significant swelling.  Cardiovascular: Normal rate, regular rhythm and normal heart sounds.   No murmur heard. Pulmonary/Chest: Effort normal and breath sounds normal. No stridor. No respiratory distress. She has no wheezes. She has no rales.  Abdominal: Soft. Bowel sounds are normal. There is no tenderness.  Musculoskeletal: Normal range of motion.  Lymphadenopathy:    She has no cervical adenopathy.  Neurological: She is alert and oriented to person, place, and time. No cranial nerve deficit. She exhibits normal muscle tone. Coordination normal.  Skin: Skin is warm. No rash noted. No erythema.    ED Course  Procedures (including critical care time)  Labs Reviewed  CBC WITH DIFFERENTIAL - Abnormal; Notable for the following:    Hemoglobin 9.5 (*)    HCT 28.5 (*)    MCV 71.6 (*)    MCH 23.9 (*)    RDW 17.4 (*)    Platelets 604 (*)    All other components within normal limits  BASIC METABOLIC PANEL   Ct Head Wo Contrast  08/18/2012  *RADIOLOGY REPORT*  Clinical Data: Headache.  CT HEAD WITHOUT CONTRAST  Technique:  Contiguous axial images were obtained from the base of the skull through the vertex without contrast.  Comparison: 02/27/2012  Findings: No intracranial hemorrhage.  No CT evidence of large acute infarct.  No hydrocephalus.  No intracranial mass lesion detected on this unenhanced exam.  Mastoid air cells, middle ear cavities and visualized sinuses are clear.  IMPRESSION: Negative noncontrast enhanced head  CT as noted above.   Original Report Authenticated By: Lacy Duverney, M.D.    Ct Soft Tissue Neck W Contrast  08/18/2012  *RADIOLOGY REPORT*  Clinical Data: Left-sided facial and neck swelling.  Headache.  CT NECK WITH CONTRAST  Technique:  Multidetector CT imaging of the neck was performed with intravenous contrast.  Contrast: 76mL OMNIPAQUE IOHEXOL 300 MG/ML  SOLN .  Comparison: 08/09/2011  Findings: No evidence of neck inflammatory process or worrisome adenopathy.  There is minimal asymmetry of the vocal cord region with  slight adduction on the right. This is of questionable significance. Direct visualization would be necessary to determine if there is vocal cord abnormality.  Lung apices which are visualized are clear.  Visualized mastoid air cells, middle ear cavities, and paranasal sinuses are clear.  No bony destructive lesion.  No significant dental disease detected.  IMPRESSION: No evidence of neck inflammatory process or worrisome adenopathy.  There is minimal asymmetry of the vocal cord region with slight adduction on the right. This is of questionable significance. Direct visualization would be necessary to determine if there is vocal cord abnormality.   Original Report Authenticated By: Lacy Duverney, M.D.    Results for orders placed during the hospital encounter of 08/18/12  CBC WITH DIFFERENTIAL      Result Value Range   WBC 7.6  4.0 - 10.5 K/uL   RBC 3.98  3.87 - 5.11 MIL/uL   Hemoglobin 9.5 (*) 12.0 - 15.0 g/dL   HCT 16.1 (*) 09.6 - 04.5 %   MCV 71.6 (*) 78.0 - 100.0 fL   MCH 23.9 (*) 26.0 - 34.0 pg   MCHC 33.3  30.0 - 36.0 g/dL   RDW 40.9 (*) 81.1 - 91.4 %   Platelets 604 (*) 150 - 400 K/uL   Neutrophils Relative 67  43 - 77 %   Neutro Abs 5.1  1.7 - 7.7 K/uL   Lymphocytes Relative 24  12 - 46 %   Lymphs Abs 1.8  0.7 - 4.0 K/uL   Monocytes Relative 8  3 - 12 %   Monocytes Absolute 0.6  0.1 - 1.0 K/uL   Eosinophils Relative 1  0 - 5 %   Eosinophils Absolute 0.1  0.0 - 0.7 K/uL    Basophils Relative 0  0 - 1 %   Basophils Absolute 0.0  0.0 - 0.1 K/uL  BASIC METABOLIC PANEL      Result Value Range   Sodium 138  135 - 145 mEq/L   Potassium 3.5  3.5 - 5.1 mEq/L   Chloride 104  96 - 112 mEq/L   CO2 26  19 - 32 mEq/L   Glucose, Bld 95  70 - 99 mg/dL   BUN 6  6 - 23 mg/dL   Creatinine, Ser 7.82  0.50 - 1.10 mg/dL   Calcium 9.5  8.4 - 95.6 mg/dL   GFR calc non Af Amer >90  >90 mL/min   GFR calc Af Amer >90  >90 mL/min     1. Neck pain on left side       MDM   Today's workup negative from 6 CT head CT soft tissue neck with contrast no evidence of abscess no evidence of significant soft tissue swelling. No evidence of sinusitis. Suspect her symptoms may be related to irritation or inflammation of the left sternocleidomastoid mastoid muscle. She is tender to palpation in that area. Will treat with muscle relaxers anti-inflammatories and pain medicine. Patient is no acute distress and nontoxic.       Shelda Jakes, MD 08/18/12 1303  Shelda Jakes, MD 08/18/12 [phone]

## 2012-08-18 NOTE — ED Notes (Signed)
Pt returned from CT. IVF infusing.

## 2012-09-18 ENCOUNTER — Emergency Department (HOSPITAL_COMMUNITY)
Admission: EM | Admit: 2012-09-18 | Discharge: 2012-09-18 | Disposition: A | Discharge status: Home / Self Care | Payer: Managed Care, Other (non HMO) | Attending: Emergency Medicine | Admitting: Emergency Medicine

## 2012-09-18 ENCOUNTER — Encounter (HOSPITAL_COMMUNITY): Payer: Self-pay

## 2012-09-18 DIAGNOSIS — F17200 Nicotine dependence, unspecified, uncomplicated: Secondary | ICD-10-CM | POA: Insufficient documentation

## 2012-09-18 DIAGNOSIS — Z8711 Personal history of peptic ulcer disease: Secondary | ICD-10-CM | POA: Insufficient documentation

## 2012-09-18 DIAGNOSIS — Z8679 Personal history of other diseases of the circulatory system: Secondary | ICD-10-CM | POA: Insufficient documentation

## 2012-09-18 DIAGNOSIS — K219 Gastro-esophageal reflux disease without esophagitis: Secondary | ICD-10-CM | POA: Insufficient documentation

## 2012-09-18 DIAGNOSIS — Z79899 Other long term (current) drug therapy: Secondary | ICD-10-CM | POA: Insufficient documentation

## 2012-09-18 DIAGNOSIS — R21 Rash and other nonspecific skin eruption: Secondary | ICD-10-CM

## 2012-09-18 DIAGNOSIS — G8929 Other chronic pain: Secondary | ICD-10-CM | POA: Insufficient documentation

## 2012-09-18 DIAGNOSIS — Z8709 Personal history of other diseases of the respiratory system: Secondary | ICD-10-CM | POA: Insufficient documentation

## 2012-09-18 DIAGNOSIS — Z8719 Personal history of other diseases of the digestive system: Secondary | ICD-10-CM | POA: Insufficient documentation

## 2012-09-18 MED ORDER — HYDROXYZINE HCL 25 MG PO TABS: 25.0000 mg | ORAL_TABLET | Freq: Four times a day (QID) | ORAL | Status: DC

## 2012-09-18 MED ORDER — HYDROXYZINE HCL 10 MG PO TABS
10.0000 mg | ORAL_TABLET | Freq: Once | ORAL | Status: AC
  Administered 2012-09-18: 10 mg via ORAL
  Filled 2012-09-18: qty 1

## 2012-09-18 MED ORDER — DEXAMETHASONE SODIUM PHOSPHATE 10 MG/ML IJ SOLN
10.0000 mg | Freq: Once | INTRAMUSCULAR | Status: AC
  Administered 2012-09-18: 10 mg via INTRAMUSCULAR
  Filled 2012-09-18: qty 1

## 2012-09-18 MED ORDER — PREDNISONE 20 MG PO TABS
60.0000 mg | ORAL_TABLET | Freq: Once | ORAL | Status: AC
  Administered 2012-09-18: 60 mg via ORAL
  Filled 2012-09-18: qty 3

## 2012-09-18 MED ORDER — PREDNISONE 20 MG PO TABS: ORAL_TABLET | ORAL | Status: DC

## 2012-09-18 NOTE — ED Provider Notes (Signed)
History    This chart was scribed for non-physician practitioner working with Richardean Canal, MD by Sofie Rower, ED Scribe. This patient was seen in room TR08C/TR08C and the patient's care was started at 7:03PM.   CSN: [id number]  Arrival date & time 09/18/12  1830   First MD Initiated Contact with Patient 09/18/12 1903      Chief Complaint  Patient presents with  . Rash    (Consider location/radiation/quality/duration/timing/severity/associated sxs/prior treatment) Patient is a 42 y.o. female presenting with rash. The history is provided by the patient. No language interpreter was used.  Rash Location:  Shoulder/arm and torso Shoulder/arm rash location:  L arm, R arm, L forearm and R forearm Torso rash location:  Lower back, abd LUQ, abd LLQ, abd RUQ and abd RLQ Quality: itchiness   Severity:  Moderate Onset quality:  Gradual Duration:  2 weeks Timing:  Constant Progression:  Worsening Chronicity:  New Context: not chemical exposure, not exposure to similar rash, not food, not insect bite/sting, not medications, not new detergent/soap, not nuts, not pollen, not pregnancy, not sick contacts and not sun exposure   Relieved by:  Nothing Worsened by:  Nothing tried Ineffective treatments:  None tried Associated symptoms: no fever, no hoarse voice, no nausea, no shortness of breath, no throat swelling, no tongue swelling and not vomiting     Jenny Evans is a 42 y.o. female , with a hx of migraine, who presents to the Emergency Department complaining of  gradual, progressively worsening, rash located at the bilateral upper arms, bilateral lower extremities, abdomen and back, onset two weeks ago.  Associated symptoms include itching located at the bilateral upper extremities and bilateral lower extremities. 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 The pt believes the new environmental exposures may have contributed to her rash at  the present point and time. The pt has taken benadryl which she informs, provides mild relief of the itching associated with the rash.  The pt denies genital involvement with regards to the rash. Additionally, the pt denies utilizing any new soaps, detergents, and lotions.   The pt is a current everyday smoker, in addition to drinking alcohol occasionally.      Past Medical History  Diagnosis Date  . Migraine   . GERD (gastroesophageal reflux disease)   . Ulcer   . Toothache   . Chronic pain   . Bronchitis     Past Surgical History  Procedure Laterality Date  . Myringotomy      No family history on file.  History  Substance Use Topics  . Smoking status: Current Every Day Smoker -- 0.25 packs/day    Types: Cigarettes  . Smokeless tobacco: Never Used  . Alcohol Use: No     Comment: occ    OB History   Grav Para Term Preterm Abortions TAB SAB Ect Mult Living                  Review of Systems  Constitutional: Negative for fever and chills.  HENT: Negative for hoarse voice.   Respiratory: Negative for shortness of breath.   Gastrointestinal: Negative for nausea and vomiting.  Skin: Positive for rash.    Allergies  Hydrocodone-acetaminophen  Home Medications   Current Outpatient Rx  Name  Route  Sig  Dispense  Refill  . albuterol (PROVENTIL HFA;VENTOLIN HFA) 108 (90 BASE) MCG/ACT inhaler   Inhalation   Inhale 2 puffs into the  lungs every 4 (four) hours as needed for wheezing or shortness of breath (cough).   1 Inhaler   0   . amoxicillin-clavulanate (AUGMENTIN) 875-125 MG per tablet   Oral   Take 1 tablet by mouth every 12 (twelve) hours.   20 tablet   0   . chlorpheniramine-HYDROcodone (TUSSIONEX) 10-8 MG/5ML LQCR   Oral   Take 5 mLs by mouth every 12 (twelve) hours as needed (cough).   140 mL   0   . cyclobenzaprine (FLEXERIL) 10 MG tablet   Oral   Take 1 tablet (10 mg total) by mouth 2 (two) times daily as needed for muscle spasms.   20  tablet   0   . esomeprazole (NEXIUM) 40 MG capsule   Oral   Take 40 mg by mouth 2 (two) times daily as needed (acid reflux).          . naproxen (NAPROSYN) 500 MG tablet   Oral   Take 1 tablet (500 mg total) by mouth 2 (two) times daily.   14 tablet   0   . sulfamethoxazole-trimethoprim (BACTRIM DS) 800-160 MG per tablet   Oral   Take 1 tablet by mouth 2 (two) times daily. Started 08-04-12 for upper respiratory infection . Has 2 doses left to take.         . SUMAtriptan (IMITREX) 6 MG/0.5ML SOLN injection   Subcutaneous   Inject 6 mg into the skin every 2 (two) hours as needed for migraine or headache. F         . traMADol (ULTRAM) 50 MG tablet   Oral   Take 1 tablet (50 mg total) by mouth every 6 (six) hours as needed for pain.   15 tablet   0     BP 119/85  Pulse 95  Temp(Src) 98.2 F (36.8 C) (Oral)  Resp 18  SpO2 98%  LMP 09/05/2012  Physical Exam  Nursing note and vitals reviewed. Constitutional: She is oriented to person, place, and time. She appears well-developed and well-nourished. No distress.  HENT:  Head: Normocephalic and atraumatic.  Eyes: EOM are normal.  Neck: Neck supple. No tracheal deviation present.  Cardiovascular: Normal rate.   Pulmonary/Chest: Effort normal. No respiratory distress.  Musculoskeletal: Normal range of motion.  Neurological: She is alert and oriented to person, place, and time.  Skin: Skin is warm and dry. Rash noted.  Multiple, scattered, macular papular lesions with some scale on the bilateral arms and trunk,   Psychiatric: She has a normal mood and affect. Her behavior is normal.    ED Course  Procedures (including critical care time)  DIAGNOSTIC STUDIES: Oxygen Saturation is 98% on room air, normal by my interpretation.    COORDINATION OF CARE:  7:35 PM- Treatment plan discussed with patient. Pt agrees with treatment.      Labs Reviewed - No data to display No results found.   1. Rash       MDM   BP 119/85  Pulse 95  Temp(Src) 98.2 F (36.8 C) (Oral)  Resp 18  SpO2 98%  LMP 09/05/2012 Rash appears to be allergic or related to bites. No contacts with similar,changes in lotions/soaps/detergents, exposure to animal or plant irritants, and denies purulent discharge. Will treat symptoms and I have referred her to both primary care and dermatology if symptoms worsen or do not resolve. The patient appears reasonably screened and/or stabilized for discharge and I doubt any other medical condition or other [facility] requiring further screening,  evaluation, or treatment in the ED at this time prior to discharge.  I personally performed the services described in this documentation, which was scribed in my presence. The recorded information has been reviewed and is accurate.          Arthor Captain, PA-C 09/21/12 1300  Arthor Captain, PA-C 09/21/12 1307

## 2012-09-18 NOTE — ED Notes (Signed)
Pt. Noticed a rash developing 2 weeks ago  Abdomen and back, arms and legs. Itching.

## 2012-09-22 NOTE — ED Provider Notes (Signed)
Medical screening examination/treatment/procedure(s) were performed by non-physician practitioner and as supervising physician I was immediately available for consultation/collaboration.   Richardean Canal, MD 09/22/12 2258

## 2012-10-13 ENCOUNTER — Encounter (HOSPITAL_COMMUNITY): Payer: Self-pay | Admitting: *Deleted

## 2012-10-13 ENCOUNTER — Emergency Department (HOSPITAL_COMMUNITY)
Admission: EM | Admit: 2012-10-13 | Discharge: 2012-10-13 | Discharge status: Against Medical Advice | Payer: Managed Care, Other (non HMO) | Attending: Emergency Medicine | Admitting: Emergency Medicine

## 2012-10-13 DIAGNOSIS — Z3202 Encounter for pregnancy test, result negative: Secondary | ICD-10-CM | POA: Insufficient documentation

## 2012-10-13 DIAGNOSIS — F17200 Nicotine dependence, unspecified, uncomplicated: Secondary | ICD-10-CM | POA: Insufficient documentation

## 2012-10-13 DIAGNOSIS — R109 Unspecified abdominal pain: Secondary | ICD-10-CM

## 2012-10-13 LAB — URINALYSIS, ROUTINE W REFLEX MICROSCOPIC
Bilirubin Urine: NEGATIVE
Leukocytes, UA: NEGATIVE
Nitrite: NEGATIVE
Specific Gravity, Urine: 1.014 (ref 1.005–1.030)
Urobilinogen, UA: 1 mg/dL (ref 0.0–1.0)
pH: 6.5 (ref 5.0–8.0)

## 2012-10-13 LAB — URINE MICROSCOPIC-ADD ON: Urine-Other: NONE SEEN

## 2012-10-13 LAB — PREGNANCY, URINE: Preg Test, Ur: NEGATIVE

## 2012-10-13 NOTE — ED Notes (Signed)
Pt upset with wait time and care. Pt states MD came into room for 5 min, called out of room by phone and did not return.  No orders were given and pt has been asleep.  Pt decided to leave AMA.  Will notify physician.

## 2012-10-13 NOTE — ED Provider Notes (Addendum)
I was called away to a cardiac arrest CODE BLUE before I could perform an H&P on this patient. This patient eloped before I returned.   [name], MD 10/13/12 4098  [name], MD 10/13/12 [phone]

## 2012-10-13 NOTE — ED Notes (Signed)
Patient is alert and oriented x3.  She is complaining of right flank pain that started today.  She states that the pain is intermittent.  Currently she rates her pain at a 6 of 10.  She adds That she has had urinary frequency.

## 2012-10-16 ENCOUNTER — Emergency Department (HOSPITAL_COMMUNITY)
Admission: EM | Admit: 2012-10-16 | Discharge: 2012-10-16 | Disposition: A | Discharge status: Home / Self Care | Payer: Managed Care, Other (non HMO) | Attending: Emergency Medicine | Admitting: Emergency Medicine

## 2012-10-16 ENCOUNTER — Encounter (HOSPITAL_COMMUNITY): Payer: Self-pay | Admitting: *Deleted

## 2012-10-16 ENCOUNTER — Emergency Department (HOSPITAL_COMMUNITY): Payer: Managed Care, Other (non HMO)

## 2012-10-16 DIAGNOSIS — F17200 Nicotine dependence, unspecified, uncomplicated: Secondary | ICD-10-CM | POA: Insufficient documentation

## 2012-10-16 DIAGNOSIS — K219 Gastro-esophageal reflux disease without esophagitis: Secondary | ICD-10-CM | POA: Insufficient documentation

## 2012-10-16 DIAGNOSIS — R079 Chest pain, unspecified: Secondary | ICD-10-CM

## 2012-10-16 DIAGNOSIS — R0789 Other chest pain: Secondary | ICD-10-CM | POA: Insufficient documentation

## 2012-10-16 DIAGNOSIS — G8929 Other chronic pain: Secondary | ICD-10-CM | POA: Insufficient documentation

## 2012-10-16 DIAGNOSIS — Z8711 Personal history of peptic ulcer disease: Secondary | ICD-10-CM | POA: Insufficient documentation

## 2012-10-16 DIAGNOSIS — Z8719 Personal history of other diseases of the digestive system: Secondary | ICD-10-CM | POA: Insufficient documentation

## 2012-10-16 DIAGNOSIS — Z79899 Other long term (current) drug therapy: Secondary | ICD-10-CM | POA: Insufficient documentation

## 2012-10-16 DIAGNOSIS — M549 Dorsalgia, unspecified: Secondary | ICD-10-CM

## 2012-10-16 DIAGNOSIS — Z8709 Personal history of other diseases of the respiratory system: Secondary | ICD-10-CM | POA: Insufficient documentation

## 2012-10-16 DIAGNOSIS — R109 Unspecified abdominal pain: Secondary | ICD-10-CM | POA: Insufficient documentation

## 2012-10-16 DIAGNOSIS — Z8679 Personal history of other diseases of the circulatory system: Secondary | ICD-10-CM | POA: Insufficient documentation

## 2012-10-16 LAB — URINALYSIS, ROUTINE W REFLEX MICROSCOPIC
Bilirubin Urine: NEGATIVE
Ketones, ur: NEGATIVE mg/dL
Leukocytes, UA: NEGATIVE
Nitrite: NEGATIVE
Protein, ur: NEGATIVE mg/dL
Urobilinogen, UA: 1 mg/dL (ref 0.0–1.0)
pH: 7 (ref 5.0–8.0)

## 2012-10-16 LAB — URINE MICROSCOPIC-ADD ON

## 2012-10-16 MED ORDER — NAPROXEN 500 MG PO TABS: 500.0000 mg | ORAL_TABLET | Freq: Two times a day (BID) | ORAL | Status: DC

## 2012-10-16 MED ORDER — KETOROLAC TROMETHAMINE 60 MG/2ML IM SOLN
60.0000 mg | Freq: Once | INTRAMUSCULAR | Status: AC
  Administered 2012-10-16: 60 mg via INTRAMUSCULAR
  Filled 2012-10-16: qty 2

## 2012-10-16 MED ORDER — TRAMADOL HCL 50 MG PO TABS: 50.0000 mg | ORAL_TABLET | Freq: Four times a day (QID) | ORAL | Status: DC | PRN

## 2012-10-16 NOTE — ED Provider Notes (Signed)
History     CSN: [id number]  Arrival date & time 10/16/12  0113   First MD Initiated Contact with Patient 10/16/12 0124      Chief Complaint  Patient presents with  . Chest Pain  . Hip Pain    (Consider location/radiation/quality/duration/timing/severity/associated sxs/prior treatment) HPI Jenny Evans is a 42 y.o. female who presents to ED with complaint of bilateral flank pain and chest pain. States symptoms began about 2 wks ago. States pain in her flank is sharp, does not radiate, worsened with movement. Denies any injuries. States "my sides feel swollen." Denies urinary symptoms. No nausea, vomiting, diarrhea. No fever. States chest pain is intermittent for several weeks as well. Hx of gerd. No sob, no cough. No recent travel or OCPs.  Past Medical History  Diagnosis Date  . Migraine   . GERD (gastroesophageal reflux disease)   . Ulcer   . Toothache   . Chronic pain   . Bronchitis     Past Surgical History  Procedure Laterality Date  . Myringotomy      No family history on file.  History  Substance Use Topics  . Smoking status: Current Every Day Smoker -- 0.25 packs/day    Types: Cigarettes  . Smokeless tobacco: Never Used  . Alcohol Use: No     Comment: occ    OB History   Grav Para Term Preterm Abortions TAB SAB Ect Mult Living                  Review of Systems  Constitutional: Negative for fever and chills.  HENT: Negative for neck pain and neck stiffness.   Respiratory: Negative.   Cardiovascular: Positive for chest pain. Negative for palpitations and leg swelling.  Gastrointestinal: Negative.   Genitourinary: Positive for flank pain. Negative for dysuria, urgency, hematuria, decreased urine volume, vaginal bleeding, vaginal discharge and pelvic pain.  Neurological: Negative for dizziness and weakness.    Allergies  Hydrocodone-acetaminophen  Home Medications   Current Outpatient Rx  Name  Route  Sig  Dispense  Refill  . albuterol  (PROVENTIL HFA;VENTOLIN HFA) 108 (90 BASE) MCG/ACT inhaler   Inhalation   Inhale 2 puffs into the lungs every 4 (four) hours as needed for wheezing or shortness of breath (cough).   1 Inhaler   0   . esomeprazole (NEXIUM) 40 MG capsule   Oral   Take 40 mg by mouth 2 (two) times daily as needed (acid reflux).          . SUMAtriptan (IMITREX) 6 MG/0.5ML SOLN injection   Subcutaneous   Inject 6 mg into the skin every 2 (two) hours as needed for migraine or headache. F           BP 112/69  Pulse 92  Temp(Src) 97.9 F (36.6 C) (Oral)  SpO2 100%  LMP 08/22/2012  Physical Exam  Nursing note and vitals reviewed. Constitutional: She is oriented to person, place, and time. She appears well-developed and well-nourished. No distress.  HENT:  Head: Normocephalic.  Eyes: Conjunctivae are normal.  Neck: Neck supple.  Cardiovascular: Normal rate, regular rhythm and normal heart sounds.   Pulmonary/Chest: Effort normal and breath sounds normal. No respiratory distress. She has no wheezes. She has no rales. She exhibits tenderness.  Tender over sternum  Abdominal: Soft.  Tender in bilateral flank. CVA tenderness bilaterally  Musculoskeletal: She exhibits no edema.  Neurological: She is alert and oriented to person, place, and time.  Skin: Skin is warm  and dry.    ED Course  Procedures (including critical care time)   Date: 10/16/2012  Rate: 77  Rhythm: normal sinus rhythm  QRS Axis: normal  Intervals: normal  ST/T Wave abnormalities: normal  Conduction Disutrbances:none  Narrative Interpretation:   Old EKG Reviewed: none available  Results for orders placed during the hospital encounter of 10/16/12  URINALYSIS, ROUTINE W REFLEX MICROSCOPIC      Result Value Range   Color, Urine YELLOW  YELLOW   APPearance CLEAR  CLEAR   Specific Gravity, Urine 1.009  1.005 - 1.030   pH 7.0  5.0 - 8.0   Glucose, UA NEGATIVE  NEGATIVE mg/dL   Hgb urine dipstick TRACE (*) NEGATIVE    Bilirubin Urine NEGATIVE  NEGATIVE   Ketones, ur NEGATIVE  NEGATIVE mg/dL   Protein, ur NEGATIVE  NEGATIVE mg/dL   Urobilinogen, UA 1.0  0.0 - 1.0 mg/dL   Nitrite NEGATIVE  NEGATIVE   Leukocytes, UA NEGATIVE  NEGATIVE  PREGNANCY, URINE      Result Value Range   Preg Test, Ur NEGATIVE  NEGATIVE  URINE MICROSCOPIC-ADD ON      Result Value Range   Squamous Epithelial / LPF FEW (*) RARE   RBC / HPF 0-2  <3 RBC/hpf   Bacteria, UA RARE  RARE   Dg Chest 2 View  10/16/2012  *RADIOLOGY REPORT*  Clinical Data: Chest pain  CHEST - 2 VIEW  Comparison: 08/04/2012  Findings: Lungs are clear. No pleural effusion or pneumothorax. The cardiomediastinal contours are within normal limits. The visualized bones and soft tissues are without significant appreciable abnormality.  IMPRESSION: No radiographic evidence of acute cardiopulmonary process.   Original Report Authenticated By: Jearld Lesch, M.D.    Dg Abd 2 Views  10/16/2012  *RADIOLOGY REPORT*  Clinical Data: Abdominal pain  ABDOMEN - 2 VIEW  Comparison: 03/31/2011 CT  Findings: Lung bases clear.  No free intraperitoneal air. The bowel gas pattern is non-obstructive. Organ outlines are normal where seen. No acute or aggressive osseous abnormality identified.  IMPRESSION: Nonobstructive bowel gas pattern.   Original Report Authenticated By: Jearld Lesch, M.D.       1. Back pain   2. Chest pain       MDM  Pt with bilateral flank pain. Worsened with movement and palpation. Atypical cp. No risk factors for PE or ACS. PERC negative. UA negative. X-rays normal. Suspect pain is musculoskeletal. Will treat with naprosyn and ultram for pain. Follow up with pcp. Pt has an apt in 2 days.    Filed Vitals:   10/16/12 0122 10/16/12 0125 10/16/12 0208 10/16/12 0336  BP: 112/69  101/61 99/72  Pulse:  92 82 73  Temp:      TempSrc:      Resp:    15  SpO2:  100% 98% 100%        Myriam Jacobson Cutberto Winfree, PA-C 10/16/12 0422

## 2012-10-16 NOTE — ED Notes (Addendum)
EDPA at [facility], speaking with pt about plan, NAD, calm, interactive, resps e/u.

## 2012-10-16 NOTE — ED Notes (Signed)
BP: L arm, small adult cuff, lying supine on back.

## 2012-10-16 NOTE — ED Notes (Signed)
Substernal cp with inspiration. Bilateral hip pain. Onset: x 1 - 2 weeks ago. Pain is much worse now.

## 2012-10-16 NOTE — ED Notes (Signed)
Pt alert, NAD, calm, interactive, resps e/u, speaking in clear complete sentences, to xray via stretcher.

## 2012-10-18 NOTE — ED Provider Notes (Signed)
Medical screening examination/treatment/procedure(s) were performed by non-physician practitioner and as supervising physician I was immediately available for consultation/collaboration.   Sophira Rumler L Amunique Neyra, MD 10/18/12 1921 

## 2012-12-14 ENCOUNTER — Encounter (HOSPITAL_COMMUNITY): Payer: Self-pay | Admitting: Emergency Medicine

## 2012-12-14 DIAGNOSIS — K219 Gastro-esophageal reflux disease without esophagitis: Secondary | ICD-10-CM | POA: Insufficient documentation

## 2012-12-14 DIAGNOSIS — Z872 Personal history of diseases of the skin and subcutaneous tissue: Secondary | ICD-10-CM | POA: Insufficient documentation

## 2012-12-14 DIAGNOSIS — D649 Anemia, unspecified: Secondary | ICD-10-CM | POA: Insufficient documentation

## 2012-12-14 DIAGNOSIS — F17200 Nicotine dependence, unspecified, uncomplicated: Secondary | ICD-10-CM | POA: Insufficient documentation

## 2012-12-14 DIAGNOSIS — G8929 Other chronic pain: Secondary | ICD-10-CM | POA: Insufficient documentation

## 2012-12-14 DIAGNOSIS — R079 Chest pain, unspecified: Secondary | ICD-10-CM | POA: Insufficient documentation

## 2012-12-14 DIAGNOSIS — Z8709 Personal history of other diseases of the respiratory system: Secondary | ICD-10-CM | POA: Insufficient documentation

## 2012-12-14 DIAGNOSIS — Z79899 Other long term (current) drug therapy: Secondary | ICD-10-CM | POA: Insufficient documentation

## 2012-12-14 DIAGNOSIS — E876 Hypokalemia: Secondary | ICD-10-CM | POA: Insufficient documentation

## 2012-12-14 DIAGNOSIS — G43909 Migraine, unspecified, not intractable, without status migrainosus: Secondary | ICD-10-CM | POA: Insufficient documentation

## 2012-12-14 DIAGNOSIS — Z3202 Encounter for pregnancy test, result negative: Secondary | ICD-10-CM | POA: Insufficient documentation

## 2012-12-14 DIAGNOSIS — Z8719 Personal history of other diseases of the digestive system: Secondary | ICD-10-CM | POA: Insufficient documentation

## 2012-12-14 NOTE — ED Notes (Signed)
PT. REPORTS INTERMITTENT MID CHEST PAIN WITH SLIGHT SOB FOR 3 DAYS , DENIES NAUSEA OR DIAPHORESIS.

## 2012-12-15 ENCOUNTER — Emergency Department (HOSPITAL_COMMUNITY)
Admit: 2012-12-15 | Discharge: 2012-12-15 | Disposition: A | Discharge status: Home / Self Care | Payer: Managed Care, Other (non HMO) | Attending: Emergency Medicine | Admitting: Emergency Medicine

## 2012-12-15 ENCOUNTER — Emergency Department (HOSPITAL_COMMUNITY)
Admission: EM | Admit: 2012-12-15 | Discharge: 2012-12-15 | Disposition: A | Discharge status: Home / Self Care | Payer: Managed Care, Other (non HMO) | Attending: Emergency Medicine | Admitting: Emergency Medicine

## 2012-12-15 DIAGNOSIS — D649 Anemia, unspecified: Secondary | ICD-10-CM

## 2012-12-15 DIAGNOSIS — E876 Hypokalemia: Secondary | ICD-10-CM

## 2012-12-15 DIAGNOSIS — R079 Chest pain, unspecified: Secondary | ICD-10-CM

## 2012-12-15 LAB — BASIC METABOLIC PANEL
Calcium: 9.3 mg/dL (ref 8.4–10.5)
GFR calc Af Amer: >90 mL/min (ref >90)
GFR calc non Af Amer: >90 mL/min (ref >90)
Potassium: 3.2 mEq/L — ABNORMAL LOW (ref 3.5–5.1)
Sodium: 137 mEq/L (ref 135–145)

## 2012-12-15 LAB — CBC
MCH: 23.2 pg — ABNORMAL LOW (ref 26.0–34.0)
MCHC: 33.1 g/dL (ref 30.0–36.0)
Platelets: 401 10*3/uL — ABNORMAL HIGH (ref 150–400)

## 2012-12-15 LAB — POCT I-STAT TROPONIN I: Troponin i, poc: 0 ng/mL (ref 0.00–0.08)

## 2012-12-15 LAB — PRO B NATRIURETIC PEPTIDE: Pro B Natriuretic peptide (BNP): 25.4 pg/mL (ref 0–125)

## 2012-12-15 MED ORDER — KETOROLAC TROMETHAMINE 60 MG/2ML IM SOLN
60.0000 mg | Freq: Once | INTRAMUSCULAR | Status: AC
  Administered 2012-12-15: 60 mg via INTRAMUSCULAR
  Filled 2012-12-15: qty 2

## 2012-12-15 MED ORDER — FAMOTIDINE 20 MG PO TABS: 20.0000 mg | ORAL_TABLET | Freq: Two times a day (BID) | ORAL | Status: DC

## 2012-12-15 MED ORDER — POTASSIUM CHLORIDE CRYS ER 20 MEQ PO TBCR
40.0000 meq | EXTENDED_RELEASE_TABLET | Freq: Once | ORAL | Status: AC
  Administered 2012-12-15: 40 meq via ORAL
  Filled 2012-12-15: qty 1

## 2012-12-15 MED ORDER — NITROGLYCERIN 0.4 MG SL SUBL: 0.4000 mg | SUBLINGUAL_TABLET | SUBLINGUAL | Status: DC | PRN

## 2012-12-15 NOTE — ED Notes (Signed)
Pt. C/o intermittent throbbing chest pain with numbness and tingling in left arm. Pt. Has hx of acid reflux.

## 2012-12-15 NOTE — ED Provider Notes (Signed)
History    CSN: [id number] Arrival date & time 12/14/12  2332  First MD Initiated Contact with Patient 12/15/12 0146     Chief Complaint  Patient presents with  . Chest Pain   (Consider location/radiation/quality/duration/timing/severity/associated sxs/prior Treatment) HPI Hx per PT - CP x 3 days L sided able to point with one finger where it hurts. No cough, no trauma, no rash, no h/o same, no associated SOB, nausea or diaphoresis. No associated leg pain or swelling. No history of DVT or PE. Pain does not radiate but has associated tingling in the left hand. 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 Uses tob daily. Pain mild to mod in severity. Has history of chronic pain and reflux disease.  Past Medical History  Diagnosis Date  . Migraine   . GERD (gastroesophageal reflux disease)   . Ulcer   . Toothache   . Chronic pain   . Bronchitis    Past Surgical History  Procedure Laterality Date  . Myringotomy     No family history on file. History  Substance Use Topics  . Smoking status: Current Every Day Smoker -- 0.25 packs/day    Types: Cigarettes  . Smokeless tobacco: Never Used  . Alcohol Use: No     Comment: occ   OB History   Grav Para Term Preterm Abortions TAB SAB Ect Mult Living                 Review of Systems  Constitutional: Negative for fever and chills.  HENT: Negative for neck pain and neck stiffness.   Eyes: Negative for pain.  Respiratory: Negative for shortness of breath.   Cardiovascular: Positive for chest pain.  Gastrointestinal: Negative for abdominal pain.  Genitourinary: Negative for dysuria.  Musculoskeletal: Negative for back pain.  Skin: Negative for rash.  Neurological: Negative for headaches.  All other systems reviewed and are negative.    Allergies  Hydrocodone-acetaminophen  Home Medications   Current Outpatient Rx  Name  Route  Sig  Dispense  Refill  . albuterol (PROVENTIL HFA;VENTOLIN HFA) 108 (90  BASE) MCG/ACT inhaler   Inhalation   Inhale 2 puffs into the lungs every 4 (four) hours as needed for wheezing or shortness of breath (cough).   1 Inhaler   0   . esomeprazole (NEXIUM) 40 MG capsule   Oral   Take 40 mg by mouth 2 (two) times daily as needed (acid reflux).          . naproxen (NAPROSYN) 500 MG tablet   Oral   Take 1 tablet (500 mg total) by mouth 2 (two) times daily.   30 tablet   0   . SUMAtriptan (IMITREX) 6 MG/0.5ML SOLN injection   Subcutaneous   Inject 6 mg into the skin every 2 (two) hours as needed for migraine or headache. F         . traMADol (ULTRAM) 50 MG tablet   Oral   Take 1 tablet (50 mg total) by mouth every 6 (six) hours as needed for pain.   15 tablet   0    BP 111/74  Pulse 60  Temp(Src) 98.5 F (36.9 C) (Oral)  Resp 20  SpO2 98%  LMP 12/06/2012 Physical Exam  Constitutional: She is oriented to person, place, and time. She appears well-developed and well-nourished.  HENT:  Head: Normocephalic and atraumatic.  Eyes: Conjunctivae and EOM are normal. Pupils are equal, round, and reactive to light.  Neck: Trachea normal. Neck supple. No thyromegaly present.  Cardiovascular: Normal rate, regular rhythm, S1 normal, S2 normal and normal pulses.     No systolic murmur is present   No diastolic murmur is present  Pulses:      Radial pulses are 2+ on the right side, and 2+ on the left side.  Pulmonary/Chest: Effort normal and breath sounds normal. She has no wheezes. She has no rhonchi. She has no rales. She exhibits no tenderness.  Abdominal: Soft. Normal appearance and bowel sounds are normal. There is no tenderness. There is no CVA tenderness and negative Murphy's sign.  Musculoskeletal:  calves nontender, no cords or erythema, negative Homans sign  Neurological: She is alert and oriented to person, place, and time. She has normal strength. No cranial nerve deficit or sensory deficit. GCS eye subscore is 4. GCS verbal subscore is 5.  GCS motor subscore is 6.  Skin: Skin is warm and dry. No rash noted. She is not diaphoretic.  Psychiatric: Her speech is normal.  Cooperative and appropriate    ED Course  Procedures (including critical care time)   Results for orders placed during the hospital encounter of 12/15/12  CBC      Result Value Range   WBC 6.2  4.0 - 10.5 K/uL   RBC 3.71 (*) 3.87 - 5.11 MIL/uL   Hemoglobin 8.6 (*) 12.0 - 15.0 g/dL   HCT 81.1 (*) 91.4 - 78.2 %   MCV 70.1 (*) 78.0 - 100.0 fL   MCH 23.2 (*) 26.0 - 34.0 pg   MCHC 33.1  30.0 - 36.0 g/dL   RDW 95.6 (*) 21.3 - 08.6 %   Platelets 401 (*) 150 - 400 K/uL  BASIC METABOLIC PANEL      Result Value Range   Sodium 137  135 - 145 mEq/L   Potassium 3.2 (*) 3.5 - 5.1 mEq/L   Chloride 102  96 - 112 mEq/L   CO2 26  19 - 32 mEq/L   Glucose, Bld 94  70 - 99 mg/dL   BUN 9  6 - 23 mg/dL   Creatinine, Ser 5.78  0.50 - 1.10 mg/dL   Calcium 9.3  8.4 - 46.9 mg/dL   GFR calc non Af Amer >90  >90 mL/min   GFR calc Af Amer >90  >90 mL/min  PRO B NATRIURETIC PEPTIDE      Result Value Range   Pro B Natriuretic peptide (BNP) 25.4  0 - 125 pg/mL  POCT I-STAT TROPONIN I      Result Value Range   Troponin i, poc 0.00  0.00 - 0.08 ng/mL   Comment 3           POCT PREGNANCY, URINE      Result Value Range   Preg Test, Ur NEGATIVE  NEGATIVE  POCT I-STAT TROPONIN I      Result Value Range   Troponin i, poc 0.05  0.00 - 0.08 ng/mL   Comment 3            Dg Chest 2 View  12/15/2012   *RADIOLOGY REPORT*  Clinical Data: Chest pain.  CHEST - 2 VIEW  Comparison: 10/16/2012.  Findings:  Cardiopericardial silhouette within normal limits. Mediastinal contours normal. Trachea midline.  No airspace disease or effusion.  IMPRESSION: No active cardiopulmonary disease.   Original Report Authenticated By: Andreas Newport, M.D.     Date: 12/15/2012  Rate: 91  Rhythm: normal sinus rhythm  QRS Axis: normal  Intervals:  normal  ST/T Wave abnormalities: nonspecific ST changes   Conduction Disutrbances:none  Narrative Interpretation: NSR with PVCs  Old EKG Reviewed: none available  Plan discharge home with outpatient referral provided. Prescription for Pepcid. Chest pain precautions verbalized is understood.  MDM  Chest pain x3 days in a patient with history of reflux and chronic pain. Symptoms improved with medications provided.  EKG reviewed as above.  Chest x-ray reviewed as above - no pneumothorax portable trait.  Labs reviewed as above, serial troponins negative.  Is anemic today and by review of records has history of same.  For mild hypokalemia, potassium provided.  Vital signs and nursing notes reviewed  Sunnie Nielsen, MD 12/15/12 [phone]

## 2012-12-15 NOTE — ED Notes (Signed)
Pt. States Toradol helped pain.

## 2012-12-26 ENCOUNTER — Encounter (HOSPITAL_BASED_OUTPATIENT_CLINIC_OR_DEPARTMENT_OTHER): Payer: Self-pay

## 2012-12-26 ENCOUNTER — Emergency Department (HOSPITAL_BASED_OUTPATIENT_CLINIC_OR_DEPARTMENT_OTHER): Payer: Managed Care, Other (non HMO)

## 2012-12-26 ENCOUNTER — Emergency Department (HOSPITAL_BASED_OUTPATIENT_CLINIC_OR_DEPARTMENT_OTHER)
Admission: EM | Admit: 2012-12-26 | Discharge: 2012-12-26 | Disposition: A | Discharge status: Home / Self Care | Payer: Managed Care, Other (non HMO) | Attending: Emergency Medicine | Admitting: Emergency Medicine

## 2012-12-26 DIAGNOSIS — Z872 Personal history of diseases of the skin and subcutaneous tissue: Secondary | ICD-10-CM | POA: Insufficient documentation

## 2012-12-26 DIAGNOSIS — K219 Gastro-esophageal reflux disease without esophagitis: Secondary | ICD-10-CM | POA: Insufficient documentation

## 2012-12-26 DIAGNOSIS — F17200 Nicotine dependence, unspecified, uncomplicated: Secondary | ICD-10-CM | POA: Insufficient documentation

## 2012-12-26 DIAGNOSIS — G8929 Other chronic pain: Secondary | ICD-10-CM | POA: Insufficient documentation

## 2012-12-26 DIAGNOSIS — G43909 Migraine, unspecified, not intractable, without status migrainosus: Secondary | ICD-10-CM | POA: Insufficient documentation

## 2012-12-26 DIAGNOSIS — Z8709 Personal history of other diseases of the respiratory system: Secondary | ICD-10-CM | POA: Insufficient documentation

## 2012-12-26 DIAGNOSIS — K5900 Constipation, unspecified: Secondary | ICD-10-CM | POA: Insufficient documentation

## 2012-12-26 MED ORDER — FLEET ENEMA 7-19 GM/118ML RE ENEM
1.0000 | ENEMA | Freq: Once | RECTAL | Status: AC
  Administered 2012-12-26: 1 via RECTAL
  Filled 2012-12-26: qty 1

## 2012-12-26 MED ORDER — MAGNESIUM CITRATE PO SOLN
1.0000 | Freq: Once | ORAL | Status: AC
  Administered 2012-12-26: 1 via ORAL
  Filled 2012-12-26: qty 296

## 2012-12-26 NOTE — ED Notes (Signed)
Pt reports she is constipated unrelieved after taking multiple doses of miralax.

## 2012-12-26 NOTE — ED Provider Notes (Signed)
History    CSN: [id number] Arrival date & time 12/26/12  1045  First MD Initiated Contact with Patient 12/26/12 1129     Chief Complaint  Patient presents with  . Constipation   (Consider location/radiation/quality/duration/timing/severity/associated sxs/prior Treatment) HPI Comments: Patient presents with constipation. She states that she has not had a bowel movement in about 2 weeks. She states that this is not uncommon for her. She has some mild discomfort along her bilateral flank areas. She denies he nausea or vomiting. She denies a fevers or chills. She denies any urinary symptoms. She's taking MiraLAX at home without improvement of symptoms.  Patient is a 42 y.o. female presenting with constipation.  Constipation Associated symptoms: no abdominal pain, no back pain, no diarrhea, no fever, no nausea and no vomiting    Past Medical History  Diagnosis Date  . Migraine   . GERD (gastroesophageal reflux disease)   . Ulcer   . Toothache   . Chronic pain   . Bronchitis    Past Surgical History  Procedure Laterality Date  . Myringotomy     No family history on file. History  Substance Use Topics  . Smoking status: Current Every Day Smoker -- 0.25 packs/day    Types: Cigarettes  . Smokeless tobacco: Never Used  . Alcohol Use: No     Comment: occ   OB History   Grav Para Term Preterm Abortions TAB SAB Ect Mult Living                 Review of Systems  Constitutional: Negative for fever, chills, diaphoresis, fatigue and unexpected weight change.  HENT: Negative for congestion, rhinorrhea and sneezing.   Eyes: Negative.   Respiratory: Negative for cough, chest tightness and shortness of breath.   Cardiovascular: Negative for chest pain and leg swelling.  Gastrointestinal: Positive for constipation. Negative for nausea, vomiting, abdominal pain, diarrhea and blood in stool.  Genitourinary: Negative for frequency, hematuria, flank pain and difficulty urinating.   Musculoskeletal: Negative for back pain and arthralgias.  Skin: Negative for rash.  Neurological: Negative for dizziness, speech difficulty, weakness, numbness and headaches.    Allergies  Hydrocodone-acetaminophen  Home Medications   Current Outpatient Rx  Name  Route  Sig  Dispense  Refill  . albuterol (PROVENTIL HFA;VENTOLIN HFA) 108 (90 BASE) MCG/ACT inhaler   Inhalation   Inhale 2 puffs into the lungs every 4 (four) hours as needed for wheezing or shortness of breath (cough).   1 Inhaler   0   . esomeprazole (NEXIUM) 40 MG capsule   Oral   Take 40 mg by mouth 2 (two) times daily.          . SUMAtriptan (IMITREX) 6 MG/0.5ML SOLN injection   Subcutaneous   Inject 6 mg into the skin every 2 (two) hours as needed for migraine or headache. F          BP 113/83  Pulse 96  Temp(Src) 98.4 F (36.9 C) (Oral)  Resp 16  Ht 5\' 4"  (1.626 m)  Wt 150 lb (68.04 kg)  BMI 25.73 kg/m2  SpO2 100%  LMP 12/06/2012 Physical Exam  Constitutional: She is oriented to person, place, and time. She appears well-developed and well-nourished.  HENT:  Head: Normocephalic and atraumatic.  Eyes: Pupils are equal, round, and reactive to light.  Neck: Normal range of motion. Neck supple.  Cardiovascular: Normal rate, regular rhythm and normal heart sounds.   Pulmonary/Chest: Effort normal and breath sounds normal. No respiratory  distress. She has no wheezes. She has no rales. She exhibits no tenderness.  Abdominal: Soft. Bowel sounds are normal. There is no tenderness. There is no rebound and no guarding.  Musculoskeletal: Normal range of motion. She exhibits no edema.  Lymphadenopathy:    She has no cervical adenopathy.  Neurological: She is alert and oriented to person, place, and time.  Skin: Skin is warm and dry. No rash noted.  Psychiatric: She has a normal mood and affect.    ED Course  Procedures (including critical care time) Labs Reviewed - No data to display Dg Abd 1  View  12/26/2012   *RADIOLOGY REPORT*  Clinical Data: Abdominal pain.  Constipation.  ABDOMEN - 1 VIEW  Comparison: 10/16/2012  Findings: No evidence of dilated bowel loops.  A large stool burden seen throughout the colon.  Right pelvic phlebolith again noted but no radiopaque calculi identified.  IMPRESSION: No acute findings.  Large stool burden noted.   Original Report Authenticated By: Myles Rosenthal, M.D.   1. Constipation     MDM  Patient is well-appearing with no abdominal tenderness on exam. She has no fevers or vomiting. At this point I did not feel that further imaging is indicated. She was discharged with a fleets enema and mag citrate to try home. I advised her followup with her primary care physician if her symptoms are not improving.  Rolan Bucco, MD 12/26/12 1214

## 2012-12-31 ENCOUNTER — Emergency Department (HOSPITAL_COMMUNITY)
Admission: EM | Admit: 2012-12-31 | Discharge: 2013-01-01 | Discharge status: Against Medical Advice | Payer: Managed Care, Other (non HMO) | Attending: Emergency Medicine | Admitting: Emergency Medicine

## 2012-12-31 ENCOUNTER — Encounter (HOSPITAL_COMMUNITY): Payer: Self-pay | Admitting: *Deleted

## 2012-12-31 DIAGNOSIS — K5900 Constipation, unspecified: Secondary | ICD-10-CM | POA: Insufficient documentation

## 2012-12-31 NOTE — ED Notes (Signed)
Continue to be unable to locate patient in [facility]

## 2012-12-31 NOTE — ED Notes (Signed)
Pt reports constipation.  LBM Sunday.  Pt reports being seen at [facility] and was given enema and stool softener and only had small BM afterwards.  Pt reports she has not had a BM since.  Pt reports taking 2 stool softeners and drinking castor oil today without relief.  Pt also reports abd pain with nausea.

## 2012-12-31 NOTE — ED Notes (Signed)
Unable to locate patient.

## 2013-01-14 ENCOUNTER — Emergency Department (HOSPITAL_BASED_OUTPATIENT_CLINIC_OR_DEPARTMENT_OTHER)
Admission: EM | Admit: 2013-01-14 | Discharge: 2013-01-14 | Disposition: A | Discharge status: Home / Self Care | Payer: Managed Care, Other (non HMO) | Attending: Emergency Medicine | Admitting: Emergency Medicine

## 2013-01-14 ENCOUNTER — Encounter (HOSPITAL_BASED_OUTPATIENT_CLINIC_OR_DEPARTMENT_OTHER): Payer: Self-pay | Admitting: *Deleted

## 2013-01-14 DIAGNOSIS — Z8709 Personal history of other diseases of the respiratory system: Secondary | ICD-10-CM | POA: Insufficient documentation

## 2013-01-14 DIAGNOSIS — Z79899 Other long term (current) drug therapy: Secondary | ICD-10-CM | POA: Insufficient documentation

## 2013-01-14 DIAGNOSIS — G43909 Migraine, unspecified, not intractable, without status migrainosus: Secondary | ICD-10-CM | POA: Insufficient documentation

## 2013-01-14 DIAGNOSIS — Z872 Personal history of diseases of the skin and subcutaneous tissue: Secondary | ICD-10-CM | POA: Insufficient documentation

## 2013-01-14 DIAGNOSIS — N343 Urethral syndrome, unspecified: Secondary | ICD-10-CM | POA: Insufficient documentation

## 2013-01-14 DIAGNOSIS — F17200 Nicotine dependence, unspecified, uncomplicated: Secondary | ICD-10-CM | POA: Insufficient documentation

## 2013-01-14 DIAGNOSIS — G8929 Other chronic pain: Secondary | ICD-10-CM | POA: Insufficient documentation

## 2013-01-14 DIAGNOSIS — Z8719 Personal history of other diseases of the digestive system: Secondary | ICD-10-CM | POA: Insufficient documentation

## 2013-01-14 DIAGNOSIS — K219 Gastro-esophageal reflux disease without esophagitis: Secondary | ICD-10-CM | POA: Insufficient documentation

## 2013-01-14 DIAGNOSIS — R109 Unspecified abdominal pain: Secondary | ICD-10-CM | POA: Insufficient documentation

## 2013-01-14 DIAGNOSIS — Z3202 Encounter for pregnancy test, result negative: Secondary | ICD-10-CM | POA: Insufficient documentation

## 2013-01-14 LAB — URINALYSIS, ROUTINE W REFLEX MICROSCOPIC
Bilirubin Urine: NEGATIVE
Glucose, UA: NEGATIVE mg/dL
Hgb urine dipstick: NEGATIVE
Nitrite: NEGATIVE
Specific Gravity, Urine: 1.014 (ref 1.005–1.030)
pH: 6.5 (ref 5.0–8.0)

## 2013-01-14 MED ORDER — NAPROXEN 500 MG PO TABS: 500.0000 mg | ORAL_TABLET | Freq: Two times a day (BID) | ORAL | Status: DC

## 2013-01-14 MED ORDER — DOXYCYCLINE HYCLATE 100 MG IV SOLR: 100.0000 mg | Freq: Once | INTRAVENOUS | Status: DC

## 2013-01-14 MED ORDER — DOXYCYCLINE HYCLATE 100 MG PO CAPS: 100.0000 mg | ORAL_CAPSULE | Freq: Two times a day (BID) | ORAL | Status: DC

## 2013-01-14 MED ORDER — NAPROXEN 250 MG PO TABS
500.0000 mg | ORAL_TABLET | Freq: Once | ORAL | Status: AC
  Administered 2013-01-14: 500 mg via ORAL
  Filled 2013-01-14: qty 2

## 2013-01-14 MED ORDER — DOXYCYCLINE HYCLATE 100 MG PO TABS
100.0000 mg | ORAL_TABLET | Freq: Once | ORAL | Status: AC
  Administered 2013-01-14: 100 mg via ORAL
  Filled 2013-01-14: qty 1

## 2013-01-14 NOTE — ED Provider Notes (Signed)
CSN: [id number]     Arrival date & time 01/14/13  0509 History     First MD Initiated Contact with Patient 01/14/13 0536     Chief Complaint  Patient presents with  . Dysuria   (Consider location/radiation/quality/duration/timing/severity/associated sxs/prior Treatment) Patient is a 42 y.o. female presenting with dysuria. The history is provided by the patient.  Dysuria She complains of bilateral flank pain for the last week. Nothing makes pain better nothing makes it worse. She rates the pain at 8.5/10. Along with this pain, she has noticed urinary urgency, frequency, and dysuria. She denies fever, chills, sweats. She denies any radiation of the pain. There is no nausea or vomiting or diarrhea. She denies any weakness or numbness or tingling. She has taken [employer] with no relief.  Past Medical History  Diagnosis Date  . Migraine   . GERD (gastroesophageal reflux disease)   . Ulcer   . Toothache   . Chronic pain   . Bronchitis    Past Surgical History  Procedure Laterality Date  . Myringotomy     No family history on file. History  Substance Use Topics  . Smoking status: Current Every Day Smoker -- 0.25 packs/day    Types: Cigarettes  . Smokeless tobacco: Never Used  . Alcohol Use: No     Comment: occ   OB History   Grav Para Term Preterm Abortions TAB SAB Ect Mult Living                 Review of Systems  Genitourinary: Positive for dysuria.  All other systems reviewed and are negative.    Allergies  Hydrocodone-acetaminophen  Home Medications   Current Outpatient Rx  Name  Route  Sig  Dispense  Refill  . albuterol (PROVENTIL HFA;VENTOLIN HFA) 108 (90 BASE) MCG/ACT inhaler   Inhalation   Inhale 2 puffs into the lungs every 4 (four) hours as needed for wheezing or shortness of breath (cough).   1 Inhaler   0   . esomeprazole (NEXIUM) 40 MG capsule   Oral   Take 40 mg by mouth 2 (two) times daily.          . SUMAtriptan (IMITREX) 6 MG/0.5ML SOLN  injection   Subcutaneous   Inject 6 mg into the skin every 2 (two) hours as needed for migraine or headache. F          BP 111/70  Pulse 80  Temp(Src) 97.7 F (36.5 C) (Oral)  Resp 18  Ht 5\' 4"  (1.626 m)  Wt 135 lb (61.236 kg)  BMI 23.16 kg/m2  SpO2 100%  LMP 12/21/2012 Physical Exam  Nursing note and vitals reviewed.  42 year old female, resting comfortably and in no acute distress. Vital signs are normal. Oxygen saturation is 100%, which is normal. Head is normocephalic and atraumatic. PERRLA, EOMI. Oropharynx is clear. Neck is nontender and supple without adenopathy or JVD. Back is nontender in the midline. There is mild to moderate bilateral CVA tenderness. There is a negative straight leg raise. Lungs are clear without rales, wheezes, or rhonchi. Chest is nontender. Heart has regular rate and rhythm without murmur. Abdomen is soft, flat, nontender without masses or hepatosplenomegaly and peristalsis is normoactive. Extremities have no cyanosis or edema, full range of motion is present. Skin is warm and dry without rash. Neurologic: Mental status is normal, cranial nerves are intact, there are no motor or sensory deficits.  ED Course   Procedures (including critical care time)  Results  for orders placed during the hospital encounter of 01/14/13  URINALYSIS, ROUTINE W REFLEX MICROSCOPIC      Result Value Range   Color, Urine YELLOW  YELLOW   APPearance CLOUDY (*) CLEAR   Specific Gravity, Urine 1.014  1.005 - 1.030   pH 6.5  5.0 - 8.0   Glucose, UA NEGATIVE  NEGATIVE mg/dL   Hgb urine dipstick NEGATIVE  NEGATIVE   Bilirubin Urine NEGATIVE  NEGATIVE   Ketones, ur NEGATIVE  NEGATIVE mg/dL   Protein, ur NEGATIVE  NEGATIVE mg/dL   Urobilinogen, UA 1.0  0.0 - 1.0 mg/dL   Nitrite NEGATIVE  NEGATIVE   Leukocytes, UA NEGATIVE  NEGATIVE  PREGNANCY, URINE      Result Value Range   Preg Test, Ur NEGATIVE  NEGATIVE   1. Bilateral flank pain   2. Urethral syndrome      MDM  Bilateral flank pain and dysuria with essentially normal urinalysis. This could be musculoskeletal pain but also consider possibility of urethral syndrome. She will be treated empirically with doxycycline and also given a prescription for naproxen. She is to followup with her PCP if not improving over the next 3 days. Old records are reviewed and there are no relevant previous visits.  Dione Booze, MD 01/14/13 [phone]

## 2013-01-14 NOTE — ED Notes (Signed)
Pt c/o urinary frequency and intermittent bilateral lower back pain x1 week.

## 2013-01-18 ENCOUNTER — Other Ambulatory Visit (HOSPITAL_COMMUNITY)
Admission: RE | Admit: 2013-01-18 | Discharge: 2013-01-18 | Disposition: A | Discharge status: Home / Self Care | Payer: Managed Care, Other (non HMO) | Source: Ambulatory Visit | Attending: Family Medicine | Admitting: Family Medicine

## 2013-01-18 ENCOUNTER — Other Ambulatory Visit: Payer: Self-pay | Admitting: Family Medicine

## 2013-01-18 DIAGNOSIS — Z0000 Encounter for general adult medical examination without abnormal findings: Secondary | ICD-10-CM | POA: Insufficient documentation

## 2013-01-19 ENCOUNTER — Other Ambulatory Visit: Payer: Self-pay

## 2013-01-19 ENCOUNTER — Other Ambulatory Visit: Payer: Self-pay | Admitting: Family Medicine

## 2013-01-19 DIAGNOSIS — Z1231 Encounter for screening mammogram for malignant neoplasm of breast: Secondary | ICD-10-CM

## 2013-02-09 ENCOUNTER — Ambulatory Visit: Payer: Managed Care, Other (non HMO)

## 2013-02-23 ENCOUNTER — Ambulatory Visit: Payer: Managed Care, Other (non HMO)

## 2013-03-14 ENCOUNTER — Emergency Department (HOSPITAL_COMMUNITY)
Admission: EM | Admit: 2013-03-14 | Discharge: 2013-03-14 | Disposition: A | Discharge status: Home / Self Care | Payer: Managed Care, Other (non HMO) | Attending: Emergency Medicine | Admitting: Emergency Medicine

## 2013-03-14 ENCOUNTER — Encounter (HOSPITAL_COMMUNITY): Payer: Self-pay | Admitting: Emergency Medicine

## 2013-03-14 ENCOUNTER — Emergency Department (HOSPITAL_COMMUNITY): Payer: Managed Care, Other (non HMO)

## 2013-03-14 DIAGNOSIS — Z8709 Personal history of other diseases of the respiratory system: Secondary | ICD-10-CM | POA: Insufficient documentation

## 2013-03-14 DIAGNOSIS — M6281 Muscle weakness (generalized): Secondary | ICD-10-CM | POA: Insufficient documentation

## 2013-03-14 DIAGNOSIS — N949 Unspecified condition associated with female genital organs and menstrual cycle: Secondary | ICD-10-CM | POA: Insufficient documentation

## 2013-03-14 DIAGNOSIS — Z3202 Encounter for pregnancy test, result negative: Secondary | ICD-10-CM | POA: Insufficient documentation

## 2013-03-14 DIAGNOSIS — K921 Melena: Secondary | ICD-10-CM | POA: Insufficient documentation

## 2013-03-14 DIAGNOSIS — N938 Other specified abnormal uterine and vaginal bleeding: Secondary | ICD-10-CM | POA: Insufficient documentation

## 2013-03-14 DIAGNOSIS — R110 Nausea: Secondary | ICD-10-CM | POA: Insufficient documentation

## 2013-03-14 DIAGNOSIS — R109 Unspecified abdominal pain: Secondary | ICD-10-CM | POA: Insufficient documentation

## 2013-03-14 DIAGNOSIS — R5381 Other malaise: Secondary | ICD-10-CM | POA: Insufficient documentation

## 2013-03-14 DIAGNOSIS — K219 Gastro-esophageal reflux disease without esophagitis: Secondary | ICD-10-CM | POA: Insufficient documentation

## 2013-03-14 DIAGNOSIS — G8929 Other chronic pain: Secondary | ICD-10-CM | POA: Insufficient documentation

## 2013-03-14 DIAGNOSIS — R630 Anorexia: Secondary | ICD-10-CM | POA: Insufficient documentation

## 2013-03-14 DIAGNOSIS — Z8669 Personal history of other diseases of the nervous system and sense organs: Secondary | ICD-10-CM | POA: Insufficient documentation

## 2013-03-14 DIAGNOSIS — Z885 Allergy status to narcotic agent status: Secondary | ICD-10-CM | POA: Insufficient documentation

## 2013-03-14 DIAGNOSIS — Z79899 Other long term (current) drug therapy: Secondary | ICD-10-CM | POA: Insufficient documentation

## 2013-03-14 DIAGNOSIS — N939 Abnormal uterine and vaginal bleeding, unspecified: Secondary | ICD-10-CM

## 2013-03-14 DIAGNOSIS — F17200 Nicotine dependence, unspecified, uncomplicated: Secondary | ICD-10-CM | POA: Insufficient documentation

## 2013-03-14 LAB — URINE MICROSCOPIC-ADD ON

## 2013-03-14 LAB — CBC WITH DIFFERENTIAL/PLATELET
Eosinophils Relative: 1 % (ref 0–5)
Hemoglobin: 12.9 g/dL (ref 12.0–15.0)
Lymphs Abs: 2.3 10*3/uL (ref 0.7–4.0)
MCV: 81 fL (ref 78.0–100.0)
Monocytes Relative: 9 % (ref 3–12)
Platelets: 371 10*3/uL (ref 150–400)
RBC: 4.43 MIL/uL (ref 3.87–5.11)
WBC: 5.6 10*3/uL (ref 4.0–10.5)

## 2013-03-14 LAB — COMPREHENSIVE METABOLIC PANEL
ALT: 13 U/L (ref 0–35)
AST: 20 U/L (ref 0–37)
Albumin: 4.1 g/dL (ref 3.5–5.2)
Calcium: 9.7 mg/dL (ref 8.4–10.5)
Sodium: 139 mEq/L (ref 135–145)
Total Protein: 7.3 g/dL (ref 6.0–8.3)

## 2013-03-14 LAB — URINALYSIS, ROUTINE W REFLEX MICROSCOPIC
Glucose, UA: NEGATIVE mg/dL
Leukocytes, UA: NEGATIVE
Protein, ur: NEGATIVE mg/dL
Urobilinogen, UA: 0.2 mg/dL (ref 0.0–1.0)

## 2013-03-14 LAB — POCT PREGNANCY, URINE: Preg Test, Ur: NEGATIVE

## 2013-03-14 LAB — CG4 I-STAT (LACTIC ACID): Lactic Acid, Venous: 1.87 mmol/L (ref 0.5–2.2)

## 2013-03-14 LAB — OCCULT BLOOD, POC DEVICE: Fecal Occult Bld: NEGATIVE

## 2013-03-14 MED ORDER — MORPHINE SULFATE 4 MG/ML IJ SOLN
4.0000 mg | Freq: Once | INTRAMUSCULAR | Status: AC
  Administered 2013-03-14: 4 mg via INTRAVENOUS
  Filled 2013-03-14: qty 1

## 2013-03-14 MED ORDER — ONDANSETRON HCL 4 MG/2ML IJ SOLN
4.0000 mg | Freq: Once | INTRAMUSCULAR | Status: AC
  Administered 2013-03-14: 4 mg via INTRAVENOUS
  Filled 2013-03-14: qty 2

## 2013-03-14 MED ORDER — OXYCODONE-ACETAMINOPHEN 5-325 MG PO TABS: 2.0000 | ORAL_TABLET | Freq: Three times a day (TID) | ORAL | Status: DC | PRN

## 2013-03-14 MED ORDER — SODIUM CHLORIDE 0.9 % IV BOLUS (SEPSIS)
1000.0000 mL | Freq: Once | INTRAVENOUS | Status: AC
  Administered 2013-03-14: 1000 mL via INTRAVENOUS

## 2013-03-14 MED ORDER — IOHEXOL 300 MG/ML  SOLN
100.0000 mL | Freq: Once | INTRAMUSCULAR | Status: AC | PRN
  Administered 2013-03-14: 100 mL via INTRAVENOUS

## 2013-03-14 MED ORDER — MORPHINE SULFATE 4 MG/ML IJ SOLN
6.0000 mg | Freq: Once | INTRAMUSCULAR | Status: AC
  Administered 2013-03-14: 6 mg via INTRAVENOUS
  Filled 2013-03-14: qty 2

## 2013-03-14 MED ORDER — IOHEXOL 300 MG/ML  SOLN
25.0000 mL | INTRAMUSCULAR | Status: AC
  Administered 2013-03-14: 25 mL via ORAL

## 2013-03-14 NOTE — ED Provider Notes (Signed)
One month bilateral flank pain with abnormal vaginal bleeding, no prior pregnancies, negative pregnancy test today, usually has normal periods however had abnormal vaginal bleeding last week with last normal period 3 weeks ago, today had some light spotting vaginal bleeding as well but no vaginal discharge, chaperone present for bimanual examination cervix is closed no cervical motion tenderness no adnexal tenderness no palpable masses nontender bimanual examination scant blood on the examination glove no discharge on examination glove, doubt pelvic inflammatory disease or endometritis, patient aware outpatient pelvic ultrasound is recommended.Patient / Family / Caregiver informed of clinical course, understand medical decision-making process, and agree with plan.  Hurman Horn, MD 03/16/13 [phone]

## 2013-03-14 NOTE — ED Notes (Signed)
In And Out Cath not necessary, Pt provided Clean Catch Urine sample.

## 2013-03-14 NOTE — ED Notes (Signed)
[facility]. From home for [facility]. Family gave Hx. Recent GI bleed. VSS for EMS. GCS 3. Pupils reactive 4 BIL. CBG 74.

## 2013-03-14 NOTE — ED Notes (Signed)
Patient transported to CT 

## 2013-03-14 NOTE — ED Provider Notes (Signed)
CSN: [id number]     Arrival date & time 03/14/13  1445 History   First MD Initiated Contact with Patient 03/14/13 1446     Chief Complaint  Patient presents with  . Altered Mental Status   (Consider location/radiation/quality/duration/timing/severity/associated sxs/prior Treatment) HPI Comments: Pt has had about 1 month BL flank pain, now with 1 week of black colored stool, fatigue and generalized weakness.  Saw Pa and PCPs office this week with plan to see GI as outpt due to concern for GI bleed given hx of heavy BC/Goody's use and report of dark stool. No hemoccult stool done in office, but Hb stable.    Patient is a 42 y.o. female presenting with flank pain. The history is provided by the patient. No language interpreter was used.  Flank Pain This is a chronic problem. Episode onset: 1 month. The problem occurs constantly. The problem has been gradually worsening. Associated symptoms include abdominal pain. Pertinent negatives include no chest pain, no headaches and no shortness of breath. Nothing aggravates the symptoms. Nothing relieves the symptoms. She has tried nothing for the symptoms. The treatment provided no relief.    Past Medical History  Diagnosis Date  . Migraine   . GERD (gastroesophageal reflux disease)   . Ulcer   . Toothache   . Chronic pain   . Bronchitis    Past Surgical History  Procedure Laterality Date  . Myringotomy     History reviewed. No pertinent family history. History  Substance Use Topics  . Smoking status: Current Every Day Smoker -- 0.25 packs/day    Types: Cigarettes  . Smokeless tobacco: Never Used  . Alcohol Use: No     Comment: occ   OB History   Grav Para Term Preterm Abortions TAB SAB Ect Mult Living                 Review of Systems  Constitutional: Negative for fever, chills, diaphoresis, activity change, appetite change and fatigue.  HENT: Negative for congestion, sore throat, facial swelling, rhinorrhea, neck pain and neck  stiffness.   Eyes: Negative for photophobia and discharge.  Respiratory: Negative for cough, chest tightness and shortness of breath.   Cardiovascular: Negative for chest pain, palpitations and leg swelling.  Gastrointestinal: Positive for abdominal pain. Negative for nausea, vomiting and diarrhea.  Endocrine: Negative for polydipsia and polyuria.  Genitourinary: Positive for flank pain. Negative for dysuria, frequency, difficulty urinating and pelvic pain.  Musculoskeletal: Negative for back pain and arthralgias.  Skin: Negative for color change and wound.  Allergic/Immunologic: Negative for immunocompromised state.  Neurological: Negative for facial asymmetry, weakness, numbness and headaches.  Hematological: Does not bruise/bleed easily.  Psychiatric/Behavioral: Negative for confusion and agitation.    Allergies  Hydrocodone-acetaminophen  Home Medications   Current Outpatient Rx  Name  Route  Sig  Dispense  Refill  . albuterol (PROVENTIL HFA;VENTOLIN HFA) 108 (90 BASE) MCG/ACT inhaler   Inhalation   Inhale 2 puffs into the lungs every 4 (four) hours as needed for wheezing or shortness of breath (cough).   1 Inhaler   0   . Aspirin-Acetaminophen-Caffeine (GOODY HEADACHE PO)   Oral   Take 1 Package by mouth daily as needed (for headache).         . oxyCODONE-acetaminophen (PERCOCET) 5-325 MG per tablet   Oral   Take 2 tablets by mouth every 8 (eight) hours as needed for pain.   10 tablet   0    BP 134/77  Pulse 89  Temp(Src) 98.1 F (36.7 C) (Oral)  Resp 17  SpO2 99%  LMP 02/14/2013 Physical Exam  Constitutional: She is oriented to person, place, and time. She appears well-developed and well-nourished. No distress.  HENT:  Head: Normocephalic and atraumatic.  Mouth/Throat: No oropharyngeal exudate.  Eyes: Pupils are equal, round, and reactive to light.  Neck: Normal range of motion. Neck supple.  Cardiovascular: Normal rate, regular rhythm and normal heart  sounds.  Exam reveals no gallop and no friction rub.   No murmur heard. Pulmonary/Chest: Effort normal and breath sounds normal. No respiratory distress. She has no wheezes. She has no rales.  Abdominal: Soft. Bowel sounds are normal. She exhibits no distension and no mass. There is no tenderness. There is no rigidity, no rebound and no guarding.  BL CVA tenderness  Musculoskeletal: Normal range of motion. She exhibits no edema and no tenderness.  Neurological: She is alert and oriented to person, place, and time.  Skin: Skin is warm and dry.  Psychiatric: She has a normal mood and affect.    ED Course  Procedures (including critical care time) Labs Review Labs Reviewed  CBC WITH DIFFERENTIAL - Abnormal; Notable for the following:    HCT 35.9 (*)    RDW 21.1 (*)    All other components within normal limits  URINALYSIS, ROUTINE W REFLEX MICROSCOPIC - Abnormal; Notable for the following:    Hgb urine dipstick SMALL (*)    All other components within normal limits  COMPREHENSIVE METABOLIC PANEL - Abnormal; Notable for the following:    Potassium 3.2 (*)    BUN 5 (*)    Creatinine, Ser 0.49 (*)    Total Bilirubin 0.1 (*)    All other components within normal limits  URINE MICROSCOPIC-ADD ON - Abnormal; Notable for the following:    Squamous Epithelial / LPF MANY (*)    All other components within normal limits  OCCULT BLOOD, POC DEVICE  CG4 I-STAT (LACTIC ACID)  POCT PREGNANCY, URINE   Imaging Review Ct Abdomen Pelvis W Contrast  03/14/2013   CLINICAL DATA:  Bilateral flank and lower abdominal pain.  EXAM: CT ABDOMEN AND PELVIS WITH CONTRAST  TECHNIQUE: Multidetector CT imaging of the abdomen and pelvis was performed using the standard protocol following bolus administration of intravenous contrast.  CONTRAST:  OMNIPAQUE IOHEXOL 300 MG/ML SOLN, 1 OMNIPAQUE IOHEXOL 300 MG/ML SOLN  COMPARISON:  03/31/2011.  FINDINGS: The lung bases are clear except for dependent bibasilar  atelectasis.  The solid abdominal organs are normal. The stomach, duodenum, small bowel and colon are unremarkable. No mesenteric or retroperitoneal mass or adenopathy. Small scattered lymph nodes are noted. The aorta is normal. The appendix is normal.  The uterus is retroverted. Endometrial thickening and area of low attenuation in the posterior myometrium. Could not exclude pregnancy. Endometrioid is or adenomyosis are other possibilities. Recommend correlation with pregnancy test. Pelvic ultrasound may be helpful for further evaluation.  No old free pelvic fluid collections. The ovaries are normal. The bladder is normal. No inguinal mass or adenopathy.  The bony structures are unremarkable.  IMPRESSION: Abnormal CT appearance of the uterus. Recommend correlation with physical examination and pelvic ultrasound may be helpful for further evaluation. The patient's pregnancy test was negative.  No other significant abdominal/ pelvic findings.   Electronically Signed   By: Loralie Champagne M.D.   On: 03/14/2013 20:27     Date: 03/14/2013  Rate: 84  Rhythm: normal sinus rhythm  QRS Axis: normal  Intervals: normal  ST/T Wave abnormalities: normal  Conduction Disutrbances: none  Narrative Interpretation: unremarkable      MDM   1. Bilateral flank pain   2. Abnormal vaginal bleeding    Pt is a 42 y.o. female with Pmhx as above who presents with BL flank, nausea, weakness, poor PO intake.  Pain presents for several weeks, constant w/o aggravating or alleviating symptoms, now with dark stool x5 for about 1 week and seeing blood on toilet paper when wiping.  She has had decreased LOC today with generalized weakness.  I called pt's PCP office, Dr. Cain Saupe, she reports PA saw pt recently, pt was complaining of 1 month of BL flank and low ab pain, hg 10.4, UA small blood at that time, but also reported using 4-6 Goody's/BC's a day.  On PE, VSS, pt weak, though GCS 15, no focal neuro findings.  +bl flank  pain on exam. No abdominal rebound or guarding. Heme negative stool. LA nml. Hb stable, no CBC elevation, urine not infected, CMP unremarkable. Waiting for CT, care transferred to Dr. Fonnie Jarvis, but doubt acute surgical cause of pain given timing of symptoms.   1. Bilateral flank pain   2. Abnormal vaginal bleeding         Shanna Cisco, MD 03/15/13 1222

## 2013-04-01 ENCOUNTER — Encounter (HOSPITAL_COMMUNITY): Payer: Self-pay | Admitting: *Deleted

## 2013-04-04 ENCOUNTER — Encounter (HOSPITAL_COMMUNITY)
Admission: RE | Disposition: A | Discharge status: Home / Self Care | Payer: Self-pay | Source: Ambulatory Visit | Attending: Gastroenterology

## 2013-04-04 ENCOUNTER — Other Ambulatory Visit: Payer: Self-pay | Admitting: Gastroenterology

## 2013-04-04 ENCOUNTER — Encounter (HOSPITAL_COMMUNITY): Payer: PRIVATE HEALTH INSURANCE | Admitting: Anesthesiology

## 2013-04-04 ENCOUNTER — Ambulatory Visit (HOSPITAL_COMMUNITY): Payer: PRIVATE HEALTH INSURANCE | Admitting: Anesthesiology

## 2013-04-04 ENCOUNTER — Ambulatory Visit (HOSPITAL_COMMUNITY)
Admission: RE | Admit: 2013-04-04 | Discharge: 2013-04-04 | Disposition: A | Discharge status: Home / Self Care | Payer: PRIVATE HEALTH INSURANCE | Source: Ambulatory Visit | Attending: Gastroenterology | Admitting: Gastroenterology

## 2013-04-04 ENCOUNTER — Encounter (HOSPITAL_COMMUNITY): Payer: Self-pay | Admitting: *Deleted

## 2013-04-04 DIAGNOSIS — R195 Other fecal abnormalities: Secondary | ICD-10-CM | POA: Insufficient documentation

## 2013-04-04 DIAGNOSIS — R109 Unspecified abdominal pain: Secondary | ICD-10-CM | POA: Insufficient documentation

## 2013-04-04 DIAGNOSIS — F17200 Nicotine dependence, unspecified, uncomplicated: Secondary | ICD-10-CM | POA: Insufficient documentation

## 2013-04-04 DIAGNOSIS — K259 Gastric ulcer, unspecified as acute or chronic, without hemorrhage or perforation: Secondary | ICD-10-CM | POA: Insufficient documentation

## 2013-04-04 DIAGNOSIS — K209 Esophagitis, unspecified without bleeding: Secondary | ICD-10-CM | POA: Insufficient documentation

## 2013-04-04 DIAGNOSIS — Z791 Long term (current) use of non-steroidal anti-inflammatories (NSAID): Secondary | ICD-10-CM | POA: Insufficient documentation

## 2013-04-04 HISTORY — DX: Sickle-cell trait: D57.3

## 2013-04-04 HISTORY — PX: ESOPHAGOGASTRODUODENOSCOPY (EGD) WITH PROPOFOL: SHX5813

## 2013-04-04 SURGERY — ESOPHAGOGASTRODUODENOSCOPY (EGD) WITH PROPOFOL
Anesthesia: Monitor Anesthesia Care

## 2013-04-04 MED ORDER — ESOMEPRAZOLE MAGNESIUM 40 MG PO CPDR: 40.0000 mg | DELAYED_RELEASE_CAPSULE | Freq: Two times a day (BID) | ORAL | Status: DC

## 2013-04-04 MED ORDER — KETAMINE HCL 50 MG/ML IJ SOLN
INTRAMUSCULAR | Status: DC | PRN
  Administered 2013-04-04: 25 mg via INTRAMUSCULAR

## 2013-04-04 MED ORDER — SODIUM CHLORIDE 0.9 % IV SOLN: INTRAVENOUS | Status: DC

## 2013-04-04 MED ORDER — PROPOFOL INFUSION 10 MG/ML OPTIME
INTRAVENOUS | Status: DC | PRN
  Administered 2013-04-04: 50 ug/kg/min via INTRAVENOUS

## 2013-04-04 MED ORDER — MIDAZOLAM HCL 5 MG/5ML IJ SOLN
INTRAMUSCULAR | Status: DC | PRN
  Administered 2013-04-04: 2 mg via INTRAVENOUS

## 2013-04-04 MED ORDER — BUTAMBEN-TETRACAINE-BENZOCAINE 2-2-14 % EX AERO
INHALATION_SPRAY | CUTANEOUS | Status: DC | PRN
  Administered 2013-04-04: 1 via TOPICAL

## 2013-04-04 MED ORDER — LACTATED RINGERS IV SOLN
INTRAVENOUS | Status: DC
  Administered 2013-04-04: 1000 mL via INTRAVENOUS

## 2013-04-04 SURGICAL SUPPLY — 15 items

## 2013-04-04 NOTE — Addendum Note (Signed)
Addended by: Willis Modena on: 04/04/2013 10:06 AM   Modules accepted: Orders

## 2013-04-04 NOTE — H&P (Signed)
Patient interval history reviewed.  Patient examined again.  There has been no change from documented H/P dated 03/31/13 (scanned into chart from our office) except as documented above.  Assessment:  1.  Abdominal pain. 2.  Dark stools. 3.  NSAID use.  Plan:  1.  Endoscopy. 2.  Risks (bleeding, infection, bowel perforation that could require surgery, sedation-related changes in cardiopulmonary systems), benefits (identification and possible treatment of source of symptoms, exclusion of certain causes of symptoms), and alternatives (watchful waiting, radiographic imaging studies, empiric medical treatment) of upper endoscopy (EGD) were explained to patient/family in detail and patient wishes to proceed.

## 2013-04-04 NOTE — Anesthesia Preprocedure Evaluation (Addendum)
Anesthesia Evaluation  Patient identified by MRN, date of birth, ID band Patient awake    Reviewed: Allergy & Precautions, H&P , NPO status , Patient's Chart, lab work & pertinent test results  Airway Mallampati: II TM Distance: >3 FB Neck ROM: Full    Dental no notable dental hx.    Pulmonary neg pulmonary ROS, Current Smoker,  breath sounds clear to auscultation  Pulmonary exam normal       Cardiovascular negative cardio ROS  Rhythm:Regular Rate:Normal     Neuro/Psych negative neurological ROS  negative psych ROS   GI/Hepatic negative GI ROS, Neg liver ROS,   Endo/Other  negative endocrine ROS  Renal/GU negative Renal ROS  negative genitourinary   Musculoskeletal negative musculoskeletal ROS (+)   Abdominal   Peds negative pediatric ROS (+)  Hematology negative hematology ROS (+)   Anesthesia Other Findings   Reproductive/Obstetrics negative OB ROS                          Anesthesia Physical Anesthesia Plan  ASA: II  Anesthesia Plan: MAC   Post-op Pain Management:    Induction: Intravenous  Airway Management Planned: Simple Face Mask  Additional Equipment:   Intra-op Plan:   Post-operative Plan: Extubation in OR  Informed Consent: I have reviewed the patients History and Physical, chart, labs and discussed the procedure including the risks, benefits and alternatives for the proposed anesthesia with the patient or authorized representative who has indicated his/her understanding and acceptance.   Dental advisory given  Plan Discussed with: CRNA  Anesthesia Plan Comments:         Anesthesia Quick Evaluation

## 2013-04-04 NOTE — Transfer of Care (Signed)
Immediate Anesthesia Transfer of Care Note  Patient: Jenny Evans  Procedure(s) Performed: Procedure(s): ESOPHAGOGASTRODUODENOSCOPY (EGD) WITH PROPOFOL (N/A)  Patient Location: PACU  Anesthesia Type:MAC  Level of Consciousness: sedated  Airway & Oxygen Therapy: Patient Spontanous Breathing and Patient connected to nasal cannula oxygen  Post-op Assessment: Report given to PACU RN and Post -op Vital signs reviewed and stable  Post vital signs: Reviewed and stable  Complications: No apparent anesthesia complications

## 2013-04-04 NOTE — Anesthesia Postprocedure Evaluation (Signed)
  Anesthesia Post-op Note  Patient: Jenny Evans  Procedure(s) Performed: Procedure(s) (LRB): ESOPHAGOGASTRODUODENOSCOPY (EGD) WITH PROPOFOL (N/A)  Patient Location: PACU  Anesthesia Type: MAC  Level of Consciousness: awake and alert   Airway and Oxygen Therapy: Patient Spontanous Breathing  Post-op Pain: mild  Post-op Assessment: Post-op Vital signs reviewed, Patient's Cardiovascular Status Stable, Respiratory Function Stable, Patent Airway and No signs of Nausea or vomiting  Last Vitals:  Filed Vitals:   04/04/13 1155  BP: 114/76  Pulse: 75  Temp: 36.9 C  Resp: 20    Post-op Vital Signs: stable   Complications: No apparent anesthesia complications

## 2013-04-04 NOTE — Op Note (Signed)
[facility] [address] [place], [address]   ENDOSCOPY PROCEDURE REPORT  PATIENT: Jenny, Evans  MR#: [id number] BIRTHDATE: [birth year] , 42  yrs. old GENDER: Female ENDOSCOPIST: Willis Modena, MD REFERRED BY:  Cammie Fulp PROCEDURE DATE:  04/04/2013 PROCEDURE:  EGD, diagnostic ASA CLASS:     Class II INDICATIONS:  dark stool, abdominal pain. MEDICATIONS: MAC sedation, administered by CRNA TOPICAL ANESTHETIC: Cetacaine Spray  DESCRIPTION OF PROCEDURE: After the risks benefits and alternatives of the procedure were thoroughly explained, informed consent was obtained.  The PENTAX GASTOROSCOPE [id number] endoscope was introduced through the mouth and advanced to the second portion of the duodenum. Without limitations.  The instrument was slowly withdrawn as the mucosa was fully examined.     Findings:  Mild distal esophagitis, otherwise normal esophagus. Several shallow small clean-based pre-pyloric antral ulcers. Otherwise normal endoscopy to the second portion of the duodenum.            The scope was then withdrawn from the patient and the procedure completed.  ENDOSCOPIC IMPRESSION:     As above.  Suspect pain and dark stools are from antral ulcers, which in turn are highly likely NSAID-related.  RECOMMENDATIONS:     1.  Watch for potential complications of procedure. 2.  Avoid/minimize NSAIDs as clinically feasible. 3.  Increase Nexium to 40 mg bid, and continue at this dosage for the next 8 weeks. 4.  If pain persists despite bid PPI, consider addition of sucralfate. 5.  Follow-up with [facility] in 2-3 months.  eSigned:  Willis Modena, MD 04/04/2013 12:04 PM   CC:

## 2013-04-05 ENCOUNTER — Encounter (HOSPITAL_COMMUNITY): Payer: Self-pay | Admitting: Gastroenterology

## 2013-05-20 ENCOUNTER — Encounter (HOSPITAL_BASED_OUTPATIENT_CLINIC_OR_DEPARTMENT_OTHER): Payer: Self-pay | Admitting: Emergency Medicine

## 2013-05-20 ENCOUNTER — Emergency Department (HOSPITAL_BASED_OUTPATIENT_CLINIC_OR_DEPARTMENT_OTHER)
Admission: EM | Admit: 2013-05-20 | Discharge: 2013-05-20 | Disposition: A | Discharge status: Home / Self Care | Payer: Managed Care, Other (non HMO) | Attending: Emergency Medicine | Admitting: Emergency Medicine

## 2013-05-20 DIAGNOSIS — K219 Gastro-esophageal reflux disease without esophagitis: Secondary | ICD-10-CM | POA: Insufficient documentation

## 2013-05-20 DIAGNOSIS — Z8709 Personal history of other diseases of the respiratory system: Secondary | ICD-10-CM | POA: Insufficient documentation

## 2013-05-20 DIAGNOSIS — K279 Peptic ulcer, site unspecified, unspecified as acute or chronic, without hemorrhage or perforation: Secondary | ICD-10-CM | POA: Insufficient documentation

## 2013-05-20 DIAGNOSIS — K5900 Constipation, unspecified: Secondary | ICD-10-CM | POA: Insufficient documentation

## 2013-05-20 DIAGNOSIS — Z8679 Personal history of other diseases of the circulatory system: Secondary | ICD-10-CM | POA: Insufficient documentation

## 2013-05-20 DIAGNOSIS — Z79899 Other long term (current) drug therapy: Secondary | ICD-10-CM | POA: Insufficient documentation

## 2013-05-20 DIAGNOSIS — G8929 Other chronic pain: Secondary | ICD-10-CM | POA: Insufficient documentation

## 2013-05-20 DIAGNOSIS — Z862 Personal history of diseases of the blood and blood-forming organs and certain disorders involving the immune mechanism: Secondary | ICD-10-CM | POA: Insufficient documentation

## 2013-05-20 DIAGNOSIS — F17200 Nicotine dependence, unspecified, uncomplicated: Secondary | ICD-10-CM | POA: Insufficient documentation

## 2013-05-20 MED ORDER — SUCRALFATE 1 GM/10ML PO SUSP: 1.0000 g | Freq: Three times a day (TID) | ORAL | Status: DC

## 2013-05-20 MED ORDER — GI COCKTAIL ~~LOC~~
30.0000 mL | Freq: Once | ORAL | Status: AC
  Administered 2013-05-20: 30 mL via ORAL
  Filled 2013-05-20: qty 30

## 2013-05-20 NOTE — ED Notes (Signed)
MD at bedside. 

## 2013-05-20 NOTE — ED Provider Notes (Signed)
CSN: [id number]     Arrival date & time 05/20/13  1451 History   First MD Initiated Contact with Patient 05/20/13 1500     Chief Complaint  Patient presents with  . Abdominal Pain   (Consider location/radiation/quality/duration/timing/severity/associated sxs/prior Treatment) HPI Comments: Patient presents with upper abdominal pain. She states it's from her peptic ulcer disease. She states she's had this pain for about 2 months. She saw her primary care physician who referred her to a gastroenterologist. She saw Dr. Dulce Sellar about a month ago and had an EGD done.  She states that showed about 3 ulcerations to her stomach. She was started on Nexium which she has been taking. She states the pains have never really gone away but got worse over the last 2 days. It's the same type of pain she's had in the past with her peptic ulcer disease. She denies he nausea or vomiting. She denies any hematemesis. She denies any blood in her stool. She saw Dr. Dulce Sellar yesterday and at that time she also had constipation and so was started on a laxative which she says has helped. He did not prescribe her anything else for her abdominal pain. She denies any fevers or chills. She denies urinary symptoms.  Patient is a 42 y.o. female presenting with abdominal pain.  Abdominal Pain Associated symptoms: no chest pain, no chills, no cough, no diarrhea, no fatigue, no fever, no hematuria, no nausea, no shortness of breath and no vomiting     Past Medical History  Diagnosis Date  . Migraine   . GERD (gastroesophageal reflux disease)   . Ulcer   . Toothache   . Chronic pain   . Bronchitis   . Sickle cell trait    Past Surgical History  Procedure Laterality Date  . Myringotomy      age 74  . Esophagogastroduodenoscopy (egd) with propofol N/A 04/04/2013    Procedure: ESOPHAGOGASTRODUODENOSCOPY (EGD) WITH PROPOFOL;  Surgeon: Willis Modena, MD;  Location: [facility];  Service: Endoscopy;  Laterality: N/A;   No family  history on file. History  Substance Use Topics  . Smoking status: Current Every Day Smoker -- 0.50 packs/day for 15 years    Types: Cigarettes  . Smokeless tobacco: Never Used  . Alcohol Use: Yes     Comment: overy rare   OB History   Grav Para Term Preterm Abortions TAB SAB Ect Mult Living                 Review of Systems  Constitutional: Negative for fever, chills, diaphoresis and fatigue.  HENT: Negative for congestion, rhinorrhea and sneezing.   Eyes: Negative.   Respiratory: Negative for cough, chest tightness and shortness of breath.   Cardiovascular: Negative for chest pain and leg swelling.  Gastrointestinal: Positive for abdominal pain. Negative for nausea, vomiting, diarrhea and blood in stool.  Genitourinary: Negative for frequency, hematuria, flank pain and difficulty urinating.  Musculoskeletal: Negative for arthralgias and back pain.  Skin: Negative for rash.  Neurological: Negative for dizziness, speech difficulty, weakness, numbness and headaches.    Allergies  Hydrocodone-acetaminophen  Home Medications   Current Outpatient Rx  Name  Route  Sig  Dispense  Refill  . albuterol (PROVENTIL HFA;VENTOLIN HFA) 108 (90 BASE) MCG/ACT inhaler   Inhalation   Inhale 2 puffs into the lungs every 4 (four) hours as needed for wheezing or shortness of breath (cough).   1 Inhaler   0   . esomeprazole (NEXIUM) 40 MG capsule  Oral   Take 1 capsule (40 mg total) by mouth 2 (two) times daily before a meal.         . oxyCODONE-acetaminophen (PERCOCET) 5-325 MG per tablet   Oral   Take 2 tablets by mouth every 8 (eight) hours as needed for pain.   10 tablet   0   . sucralfate (CARAFATE) 1 GM/10ML suspension   Oral   Take 10 mLs (1 g total) by mouth 4 (four) times daily -  with meals and at bedtime.   420 mL   0    BP 113/74  Pulse 89  Temp(Src) 98.2 F (36.8 C) (Oral)  Resp 20  Wt 130 lb (58.968 kg)  SpO2 100% Physical Exam  Constitutional: She is  oriented to person, place, and time. She appears well-developed and well-nourished.  HENT:  Head: Normocephalic and atraumatic.  Eyes: Pupils are equal, round, and reactive to light.  Neck: Normal range of motion. Neck supple.  Cardiovascular: Normal rate, regular rhythm and normal heart sounds.   Pulmonary/Chest: Effort normal and breath sounds normal. No respiratory distress. She has no wheezes. She has no rales. She exhibits no tenderness.  Abdominal: Soft. Bowel sounds are normal. There is tenderness (mild epigastric TTP). There is no rebound and no guarding.  Musculoskeletal: Normal range of motion. She exhibits no edema.  Lymphadenopathy:    She has no cervical adenopathy.  Neurological: She is alert and oriented to person, place, and time.  Skin: Skin is warm and dry. No rash noted.  Psychiatric: She has a normal mood and affect.    ED Course  Procedures (including critical care time) Labs Review Labs Reviewed - No data to display Imaging Review No results found.  EKG Interpretation   None       MDM   1. PUD (peptic ulcer disease)    Patient is given a GI cocktail and her pain is completely relieved. She stayed up in bed smiling and using her phone.  Given that she has no current pain, I have a low suspicion of other etiology such as pancreatitis. She has no suggestions of perforation. She was given a prescription for Carafate and advised to followup with Dr. Dulce Sellar.    Rolan Bucco, MD 05/20/13 [phone]

## 2013-05-20 NOTE — ED Notes (Signed)
Abdominal pain. She was seen by a GI and had a upper GI and was told she has ulcers. She is suppose to take nexium.

## 2013-07-04 ENCOUNTER — Emergency Department (HOSPITAL_COMMUNITY)
Admission: EM | Admit: 2013-07-04 | Discharge: 2013-07-04 | Disposition: A | Discharge status: Home / Self Care | Payer: Managed Care, Other (non HMO) | Attending: Emergency Medicine | Admitting: Emergency Medicine

## 2013-07-04 ENCOUNTER — Encounter (HOSPITAL_COMMUNITY): Payer: Self-pay | Admitting: Emergency Medicine

## 2013-07-04 DIAGNOSIS — Z872 Personal history of diseases of the skin and subcutaneous tissue: Secondary | ICD-10-CM | POA: Insufficient documentation

## 2013-07-04 DIAGNOSIS — K219 Gastro-esophageal reflux disease without esophagitis: Secondary | ICD-10-CM | POA: Insufficient documentation

## 2013-07-04 DIAGNOSIS — Z862 Personal history of diseases of the blood and blood-forming organs and certain disorders involving the immune mechanism: Secondary | ICD-10-CM | POA: Insufficient documentation

## 2013-07-04 DIAGNOSIS — F17200 Nicotine dependence, unspecified, uncomplicated: Secondary | ICD-10-CM | POA: Insufficient documentation

## 2013-07-04 DIAGNOSIS — R0981 Nasal congestion: Secondary | ICD-10-CM

## 2013-07-04 DIAGNOSIS — Z79899 Other long term (current) drug therapy: Secondary | ICD-10-CM | POA: Insufficient documentation

## 2013-07-04 DIAGNOSIS — Z8739 Personal history of other diseases of the musculoskeletal system and connective tissue: Secondary | ICD-10-CM | POA: Insufficient documentation

## 2013-07-04 DIAGNOSIS — J3489 Other specified disorders of nose and nasal sinuses: Secondary | ICD-10-CM | POA: Insufficient documentation

## 2013-07-04 DIAGNOSIS — G43909 Migraine, unspecified, not intractable, without status migrainosus: Secondary | ICD-10-CM

## 2013-07-04 DIAGNOSIS — Z8709 Personal history of other diseases of the respiratory system: Secondary | ICD-10-CM | POA: Insufficient documentation

## 2013-07-04 DIAGNOSIS — G8929 Other chronic pain: Secondary | ICD-10-CM | POA: Insufficient documentation

## 2013-07-04 MED ORDER — KETOROLAC TROMETHAMINE 60 MG/2ML IM SOLN
60.0000 mg | Freq: Once | INTRAMUSCULAR | Status: AC
  Administered 2013-07-04: 60 mg via INTRAMUSCULAR
  Filled 2013-07-04: qty 2

## 2013-07-04 MED ORDER — PSEUDOEPHEDRINE HCL 60 MG PO TABS
30.0000 mg | ORAL_TABLET | Freq: Once | ORAL | Status: AC
  Administered 2013-07-04: 30 mg via ORAL
  Filled 2013-07-04: qty 1

## 2013-07-04 MED ORDER — DIPHENHYDRAMINE HCL 25 MG PO CAPS
25.0000 mg | ORAL_CAPSULE | Freq: Once | ORAL | Status: AC
  Administered 2013-07-04: 25 mg via ORAL
  Filled 2013-07-04: qty 1

## 2013-07-04 MED ORDER — FLUTICASONE PROPIONATE 50 MCG/ACT NA SUSP: 1.0000 | Freq: Every day | NASAL | Status: DC

## 2013-07-04 MED ORDER — PSEUDOEPHEDRINE HCL 30 MG PO TABS: 30.0000 mg | ORAL_TABLET | Freq: Four times a day (QID) | ORAL | Status: DC | PRN

## 2013-07-04 MED ORDER — METOCLOPRAMIDE HCL 5 MG/ML IJ SOLN
10.0000 mg | Freq: Once | INTRAMUSCULAR | Status: AC
  Administered 2013-07-04: 10 mg via INTRAMUSCULAR
  Filled 2013-07-04: qty 2

## 2013-07-04 NOTE — ED Provider Notes (Signed)
CSN: [id number]     Arrival date & time 07/04/13  0758 History   First MD Initiated Contact with Patient 07/04/13 [phone]     Chief Complaint  Patient presents with  . Migraine   (Consider location/radiation/quality/duration/timing/severity/associated sxs/prior Treatment) HPI Comments: Pt is a 43 y/o female with a PMHx of migraines who presents to the ED complaining of gradual onset headache x 3 days. States the headache is the same as her typical migraines. Headache located on left side, throbbing, constant. Tried taking imitrex without relief two days ago. Admits to associated nausea, photophobia and phonophobia. Denies vomiting, fever, chills, night sweats, rashes, vision changes.  Patient is a 43 y.o. female presenting with migraines. The history is provided by the patient.  Migraine Associated symptoms include headaches and nausea.    Past Medical History  Diagnosis Date  . Migraine   . GERD (gastroesophageal reflux disease)   . Ulcer   . Toothache   . Chronic pain   . Bronchitis   . Sickle cell trait    Past Surgical History  Procedure Laterality Date  . Myringotomy      age 32  . Esophagogastroduodenoscopy (egd) with propofol N/A 04/04/2013    Procedure: ESOPHAGOGASTRODUODENOSCOPY (EGD) WITH PROPOFOL;  Surgeon: Willis Modena, MD;  Location: [facility];  Service: Endoscopy;  Laterality: N/A;   History reviewed. No pertinent family history. History  Substance Use Topics  . Smoking status: Current Every Day Smoker -- 0.50 packs/day for 15 years    Types: Cigarettes  . Smokeless tobacco: Never Used  . Alcohol Use: Yes     Comment: overy rare   OB History   Grav Para Term Preterm Abortions TAB SAB Ect Mult Living                 Review of Systems  Eyes: Positive for photophobia.  Gastrointestinal: Positive for nausea.  Neurological: Positive for headaches.  All other systems reviewed and are negative.    Allergies  Hydrocodone-acetaminophen  Home Medications    Current Outpatient Rx  Name  Route  Sig  Dispense  Refill  . albuterol (PROVENTIL HFA;VENTOLIN HFA) 108 (90 BASE) MCG/ACT inhaler   Inhalation   Inhale 2 puffs into the lungs every 4 (four) hours as needed for wheezing or shortness of breath (cough).   1 Inhaler   0   . esomeprazole (NEXIUM) 40 MG capsule   Oral   Take 1 capsule (40 mg total) by mouth 2 (two) times daily before a meal.         . SUMAtriptan (IMITREX) 6 MG/0.5ML SOLN injection   Subcutaneous   Inject 6 mg into the skin every 2 (two) hours as needed for migraine or headache. May repeat in 2 hours if headache persists or recurs.          BP 125/87  Pulse 85  Temp(Src) 97.6 F (36.4 C) (Oral)  Resp 16  SpO2 100%  LMP 06/20/2013 Physical Exam  Nursing note and vitals reviewed. Constitutional: She is oriented to person, place, and time. She appears well-developed and well-nourished. No distress.  Laying comfortably on exam bed with lights off.  HENT:  Head: Normocephalic and atraumatic.  Mouth/Throat: Oropharynx is clear and moist.  Eyes: Conjunctivae and EOM are normal. Pupils are equal, round, and reactive to light.  Neck: Normal range of motion. Neck supple.  No meningeal signs.  Cardiovascular: Normal rate, regular rhythm and normal heart sounds.   Pulmonary/Chest: Effort normal and breath sounds normal.  Abdominal: Soft. Bowel sounds are normal. There is no tenderness.  Musculoskeletal: Normal range of motion. She exhibits no edema.  Neurological: She is alert and oriented to person, place, and time. She has normal strength. No cranial nerve deficit or sensory deficit. She displays a negative Romberg sign. Coordination and gait normal.  Skin: Skin is warm and dry. She is not diaphoretic.  Psychiatric: She has a normal mood and affect. Her behavior is normal.    ED Course  Procedures (including critical care time) Labs Review Labs Reviewed - No data to display Imaging Review No results  found.  EKG Interpretation   None       MDM   1. Migraine     Pt presenting with headache, similar to her typical migraines. She appears in NAD, normal VS. Afebrile. No meningeal signs. Unremarkable neuro exam. Will give Toradol, reglan, benadryl and re-assess. 10:04 AM Pt reports improved symptoms with above medications. She is stable for discharge. Return precautions given. Patient states understanding of treatment care plan and is agreeable.   Trevor MaceRobyn M Albert, PA-C 07/04/13 1004

## 2013-07-04 NOTE — Discharge Instructions (Signed)

## 2013-07-04 NOTE — Discharge Instructions (Signed)
Migraine Headache A migraine headache is an intense, throbbing pain on one or both sides of your head. A migraine can last for 30 minutes to several hours. CAUSES  The exact cause of a migraine headache is not always known. However, a migraine may be caused when nerves in the brain become irritated and release chemicals that cause inflammation. This causes pain. Certain things may also trigger migraines, such as:  Alcohol.  Smoking.  Stress.  Menstruation.  Aged cheeses.  Foods or drinks that contain nitrates, glutamate, aspartame, or tyramine.  Lack of sleep.  Chocolate.  Caffeine.  Hunger.  Physical exertion.  Fatigue.  Medicines used to treat chest pain (nitroglycerine), birth control pills, estrogen, and some blood pressure medicines. SIGNS AND SYMPTOMS  Pain on one or both sides of your head.  Pulsating or throbbing pain.  Severe pain that prevents daily activities.  Pain that is aggravated by any physical activity.  Nausea, vomiting, or both.  Dizziness.  Pain with exposure to bright lights, loud noises, or activity.  General sensitivity to bright lights, loud noises, or smells. Before you get a migraine, you may get warning signs that a migraine is coming (aura). An aura may include:  Seeing flashing lights.  Seeing bright spots, halos, or zig-zag lines.  Having tunnel vision or blurred vision.  Having feelings of numbness or tingling.  Having trouble talking.  Having muscle weakness. DIAGNOSIS  A migraine headache is often diagnosed based on:  Symptoms.  Physical exam.  A CT scan or MRI of your head. These imaging tests cannot diagnose migraines, but they can help rule out other causes of headaches. TREATMENT Medicines may be given for pain and nausea. Medicines can also be given to help prevent recurrent migraines.  HOME CARE INSTRUCTIONS  Only take over-the-counter or prescription medicines for pain or discomfort as directed by your  health care provider. The use of long-term narcotics is not recommended.  Lie down in a dark, quiet room when you have a migraine.  Keep a journal to find out what may trigger your migraine headaches. For example, write down:  What you eat and drink.  How much sleep you get.  Any change to your diet or medicines.  Limit alcohol consumption.  Quit smoking if you smoke.  Get 7 9 hours of sleep, or as recommended by your health care provider.  Limit stress.  Keep lights dim if bright lights bother you and make your migraines worse. SEEK IMMEDIATE MEDICAL CARE IF:   Your migraine becomes severe.  You have a fever.  You have a stiff neck.  You have vision loss.  You have muscular weakness or loss of muscle control.  You start losing your balance or have trouble walking.  You feel faint or pass out.  You have severe symptoms that are different from your first symptoms. MAKE SURE YOU:   Understand these instructions.  Will watch your condition.  Will get help right away if you are not doing well or get worse. Document Released: 06/02/2005 Document Revised: 03/23/2013 Document Reviewed: 02/07/2013 [employer] Patient Information 2014 [employer].  

## 2013-07-04 NOTE — ED Provider Notes (Signed)
Medical screening examination/treatment/procedure(s) were performed by non-physician practitioner and as supervising physician I was immediately available for consultation/collaboration.  EKG Interpretation   None         Gilda Creasehristopher J. Lasheika Ortloff, MD 07/04/13 1007

## 2013-07-04 NOTE — ED Notes (Signed)
Pt has hx of migraines.  Pt states takes shots at home and it is not working.  Pt states migraine x 3 days.  Also states some sinus congestion noted.

## 2013-07-04 NOTE — ED Provider Notes (Signed)
CSN: [id number]     Arrival date & time 07/04/13  2001 History  This chart was scribed for non-physician practitioner, Roxy Horsemanobert Noris Kulinski, PA-C working with Juliet RudeNathan R. Rubin PayorPickering, MD by Luisa DagoPriscilla Tutu, ED scribe. This patient was seen in room WTR9/WTR9 and the patient's care was started at 9:50 PM.    Chief Complaint  Patient presents with  . Headache  . Nasal Congestion   The history is provided by the patient. No language interpreter was used.   HPI Comments: Jenny Evans is a 43 y.o. female who presents to the Emergency Department with a chief complaint of pain in her nose and sinus congestion/pressure that started a couple of hours before reporting to work today. She states that the sinus pressure and congestion is making it hard to breathe. Pt is also complaining of associated nausea.Pt reports ibuprofen and benadryl with minimal to no relief. Pt has a history of migraines. She also reports getting nauseous when she takes Vicodin. She denies any fever.   Past Medical History  Diagnosis Date  . Migraine   . GERD (gastroesophageal reflux disease)   . Ulcer   . Toothache   . Chronic pain   . Bronchitis   . Sickle cell trait    Past Surgical History  Procedure Laterality Date  . Myringotomy      age 43  . Esophagogastroduodenoscopy (egd) with propofol N/A 04/04/2013    Procedure: ESOPHAGOGASTRODUODENOSCOPY (EGD) WITH PROPOFOL;  Surgeon: Willis ModenaWilliam Outlaw, MD;  Location: [facility] ENDOSCOPY;  Service: Endoscopy;  Laterality: N/A;   No family history on file. History  Substance Use Topics  . Smoking status: Current Every Day Smoker -- 0.50 packs/day for 15 years    Types: Cigarettes  . Smokeless tobacco: Never Used  . Alcohol Use: Yes     Comment: overy rare   OB History   Grav Para Term Preterm Abortions TAB SAB Ect Mult Living                 Review of Systems  HENT: Positive for congestion and sinus pressure.    A complete 10 system review of systems was obtained and all  systems are negative except as noted in the HPI and PMH.   Allergies  Hydrocodone-acetaminophen  Home Medications   Current Outpatient Rx  Name  Route  Sig  Dispense  Refill  . albuterol (PROVENTIL HFA;VENTOLIN HFA) 108 (90 BASE) MCG/ACT inhaler   Inhalation   Inhale 2 puffs into the lungs every 4 (four) hours as needed for wheezing or shortness of breath (cough).   1 Inhaler   0   . esomeprazole (NEXIUM) 40 MG capsule   Oral   Take 1 capsule (40 mg total) by mouth 2 (two) times daily before a meal.         . ibuprofen (ADVIL,MOTRIN) 200 MG tablet   Oral   Take 400 mg by mouth every 6 (six) hours as needed (pain).         . SUMAtriptan (IMITREX) 6 MG/0.5ML SOLN injection   Subcutaneous   Inject 6 mg into the skin every 2 (two) hours as needed for migraine or headache. May repeat in 2 hours if headache persists or recurs.          Triage vitals: BP 132/103  Pulse 85  Temp(Src) 98 F (36.7 C) (Oral)  Resp 18  Ht 5\' 5"  (1.651 m)  Wt 125 lb (56.7 kg)  BMI 20.80 kg/m2  SpO2 98%  LMP 06/20/2013  Physical Exam  Nursing note and vitals reviewed. Constitutional: She is oriented to person, place, and time. She appears well-developed and well-nourished. No distress.  HENT:  Head: Normocephalic and atraumatic.  Mild congestion behid bilateral TMs, nasal turbinates  mildly inflammed, oropharynx is clear, no exudates, no evidence of peritonsillar edema , uvula is midlined, airway is intact.  Eyes: EOM are normal.  Neck: Neck supple. No tracheal deviation present.  Cardiovascular: Normal rate, regular rhythm and normal heart sounds.  Exam reveals no gallop and no friction rub.   No murmur heard. Pulmonary/Chest: Effort normal and breath sounds normal. No respiratory distress. She has no wheezes. She has no rales. She exhibits no tenderness.  Musculoskeletal: Normal range of motion.  Lymphadenopathy:    She has no cervical adenopathy.  Neurological: She is alert and  oriented to person, place, and time.  Skin: Skin is warm and dry.  Psychiatric: She has a normal mood and affect. Her behavior is normal.    ED Course  Procedures (including critical care time)  DIAGNOSTIC STUDIES: Oxygen Saturation is 98% on RA, normal by my interpretation.    COORDINATION OF CARE: 9:55 PM- Will prescribe pt a nasal spray to relieve congestion. Advised pt that if she has a fever she should return to the ED. Pt advised of plan for treatment and pt agrees.  Medications - No data to display  Labs Review Labs Reviewed - No data to display Imaging Review No results found.  EKG Interpretation   None       MDM   1. Sinus congestion      I personally performed the services described in this documentation, which was scribed in my presence. The recorded information has been reviewed and is accurate.     [name], PA-C 07/05/13 [id number]

## 2013-07-04 NOTE — ED Notes (Signed)
Pt from home c/o a headache x 4 days, sinus congestion that started today and hurts when she takes a deep breath but denies shortness of breath.

## 2013-07-05 ENCOUNTER — Emergency Department (HOSPITAL_BASED_OUTPATIENT_CLINIC_OR_DEPARTMENT_OTHER)
Admission: EM | Admit: 2013-07-05 | Discharge: 2013-07-05 | Disposition: A | Discharge status: Home / Self Care | Payer: Managed Care, Other (non HMO) | Attending: Emergency Medicine | Admitting: Emergency Medicine

## 2013-07-05 ENCOUNTER — Encounter (HOSPITAL_BASED_OUTPATIENT_CLINIC_OR_DEPARTMENT_OTHER): Payer: Self-pay | Admitting: Emergency Medicine

## 2013-07-05 DIAGNOSIS — G43909 Migraine, unspecified, not intractable, without status migrainosus: Secondary | ICD-10-CM | POA: Insufficient documentation

## 2013-07-05 DIAGNOSIS — Z862 Personal history of diseases of the blood and blood-forming organs and certain disorders involving the immune mechanism: Secondary | ICD-10-CM | POA: Insufficient documentation

## 2013-07-05 DIAGNOSIS — R519 Headache, unspecified: Secondary | ICD-10-CM

## 2013-07-05 DIAGNOSIS — Z872 Personal history of diseases of the skin and subcutaneous tissue: Secondary | ICD-10-CM | POA: Insufficient documentation

## 2013-07-05 DIAGNOSIS — R51 Headache: Secondary | ICD-10-CM | POA: Insufficient documentation

## 2013-07-05 DIAGNOSIS — Z9889 Other specified postprocedural states: Secondary | ICD-10-CM | POA: Insufficient documentation

## 2013-07-05 DIAGNOSIS — Z8709 Personal history of other diseases of the respiratory system: Secondary | ICD-10-CM | POA: Insufficient documentation

## 2013-07-05 DIAGNOSIS — H9202 Otalgia, left ear: Secondary | ICD-10-CM

## 2013-07-05 DIAGNOSIS — H9209 Otalgia, unspecified ear: Secondary | ICD-10-CM | POA: Insufficient documentation

## 2013-07-05 DIAGNOSIS — Z79899 Other long term (current) drug therapy: Secondary | ICD-10-CM | POA: Insufficient documentation

## 2013-07-05 DIAGNOSIS — F17200 Nicotine dependence, unspecified, uncomplicated: Secondary | ICD-10-CM | POA: Insufficient documentation

## 2013-07-05 DIAGNOSIS — IMO0002 Reserved for concepts with insufficient information to code with codable children: Secondary | ICD-10-CM | POA: Insufficient documentation

## 2013-07-05 DIAGNOSIS — G8929 Other chronic pain: Secondary | ICD-10-CM | POA: Insufficient documentation

## 2013-07-05 DIAGNOSIS — K219 Gastro-esophageal reflux disease without esophagitis: Secondary | ICD-10-CM | POA: Insufficient documentation

## 2013-07-05 MED ORDER — OXYCODONE-ACETAMINOPHEN 5-325 MG PO TABS
2.0000 | ORAL_TABLET | Freq: Once | ORAL | Status: AC
  Administered 2013-07-05: 2 via ORAL
  Filled 2013-07-05: qty 2

## 2013-07-05 MED ORDER — OXYCODONE-ACETAMINOPHEN 5-325 MG PO TABS: 2.0000 | ORAL_TABLET | ORAL | Status: DC | PRN

## 2013-07-05 NOTE — ED Notes (Signed)
[facility]. Today she feels worse. Swelling to the left side of her neck. Sore throat, ear pain and headache as yesterday.

## 2013-07-05 NOTE — ED Notes (Signed)
C/o left sided neck pain and facial pressure,  Was seen at [facility] yesterday for same

## 2013-07-05 NOTE — ED Provider Notes (Signed)
Medical screening examination/treatment/procedure(s) were performed by non-physician practitioner and as supervising physician I was immediately available for consultation/collaboration.     Nevaen Tredway, MD 07/05/13 2306 

## 2013-07-05 NOTE — Discharge Instructions (Signed)

## 2013-07-05 NOTE — ED Provider Notes (Signed)
CSN: [id number]     Arrival date & time 07/05/13  2014 History   First MD Initiated Contact with Patient 07/05/13 2110     Chief Complaint  Patient presents with  . Headache  . Otalgia   (Consider location/radiation/quality/duration/timing/severity/associated sxs/prior Treatment) Patient is a 43 y.o. female presenting with headaches and ear pain. The history is provided by the patient. No language interpreter was used.  Headache Pain location:  L temporal Radiates to:  L neck Severity currently:  8/10 Severity at highest:  8/10 Onset quality:  Gradual Relieved by:  Nothing Associated symptoms: ear pain   Otalgia Associated symptoms: headaches   Pt complains of a headache and left ear pain.    Past Medical History  Diagnosis Date  . Migraine   . GERD (gastroesophageal reflux disease)   . Ulcer   . Toothache   . Chronic pain   . Bronchitis   . Sickle cell trait    Past Surgical History  Procedure Laterality Date  . Myringotomy      age 43  . Esophagogastroduodenoscopy (egd) with propofol N/A 04/04/2013    Procedure: ESOPHAGOGASTRODUODENOSCOPY (EGD) WITH PROPOFOL;  Surgeon: Willis ModenaWilliam Outlaw, MD;  Location: [facility] ENDOSCOPY;  Service: Endoscopy;  Laterality: N/A;   No family history on file. History  Substance Use Topics  . Smoking status: Current Every Day Smoker -- 0.50 packs/day for 15 years    Types: Cigarettes  . Smokeless tobacco: Never Used  . Alcohol Use: Yes     Comment: overy rare   OB History   Grav Para Term Preterm Abortions TAB SAB Ect Mult Living                 Review of Systems  HENT: Positive for ear pain.   Neurological: Positive for headaches.  All other systems reviewed and are negative.    Allergies  Hydrocodone-acetaminophen  Home Medications   Current Outpatient Rx  Name  Route  Sig  Dispense  Refill  . albuterol (PROVENTIL HFA;VENTOLIN HFA) 108 (90 BASE) MCG/ACT inhaler   Inhalation   Inhale 2 puffs into the lungs every 4 (four)  hours as needed for wheezing or shortness of breath (cough).   1 Inhaler   0   . esomeprazole (NEXIUM) 40 MG capsule   Oral   Take 1 capsule (40 mg total) by mouth 2 (two) times daily before a meal.         . fluticasone (FLONASE) 50 MCG/ACT nasal spray   Each Nare   Place 1 spray into both nostrils daily.   16 g   0   . ibuprofen (ADVIL,MOTRIN) 200 MG tablet   Oral   Take 400 mg by mouth every 6 (six) hours as needed (pain).         . pseudoephedrine (SUDAFED) 30 MG tablet   Oral   Take 1 tablet (30 mg total) by mouth every 6 (six) hours as needed for congestion.   30 tablet   0   . SUMAtriptan (IMITREX) 6 MG/0.5ML SOLN injection   Subcutaneous   Inject 6 mg into the skin every 2 (two) hours as needed for migraine or headache. May repeat in 2 hours if headache persists or recurs.          BP 139/84  Pulse 84  Temp(Src) 97.9 F (36.6 C) (Oral)  Resp 14  Ht 5\' 5"  (1.651 m)  Wt 120 lb (54.432 kg)  BMI 19.97 kg/m2  SpO2 100%  LMP 06/20/2013  Physical Exam  Nursing note and vitals reviewed. Constitutional: She appears well-developed and well-nourished.  HENT:  Head: Normocephalic and atraumatic.  Right Ear: External ear normal.  Left Ear: External ear normal.  Nose: Nose normal.  Mouth/Throat: Oropharynx is clear and moist.  Eyes: Conjunctivae are normal. Pupils are equal, round, and reactive to light.  Neck: Normal range of motion. Neck supple.  Cardiovascular: Normal rate, regular rhythm and normal heart sounds.   Pulmonary/Chest: Effort normal.  Abdominal: Soft.  Musculoskeletal: Normal range of motion.  Neurological: She is alert.  Skin: Skin is warm.  Psychiatric: She has a normal mood and affect.    ED Course  Procedures (including critical care time) Labs Review Labs Reviewed - No data to display Imaging Review No results found.  EKG Interpretation   None       MDM   1. Otalgia of left ear   2. Headache    percocet    Elson Areas, PA-C 07/05/13 2140

## 2013-07-05 NOTE — ED Notes (Signed)
Pt denies nausea at time of dc

## 2013-07-06 NOTE — ED Provider Notes (Signed)
Medical screening examination/treatment/procedure(s) were performed by non-physician practitioner and as supervising physician I was immediately available for consultation/collaboration.  EKG Interpretation   None        Tylyn Derwin R. Aaniyah Strohm, MD 07/06/13 1454 

## 2013-07-13 ENCOUNTER — Encounter (HOSPITAL_COMMUNITY): Payer: Self-pay | Admitting: Emergency Medicine

## 2013-07-13 ENCOUNTER — Emergency Department (HOSPITAL_COMMUNITY)
Admission: EM | Admit: 2013-07-13 | Discharge: 2013-07-13 | Disposition: A | Discharge status: Home / Self Care | Payer: Managed Care, Other (non HMO) | Attending: Emergency Medicine | Admitting: Emergency Medicine

## 2013-07-13 DIAGNOSIS — Z862 Personal history of diseases of the blood and blood-forming organs and certain disorders involving the immune mechanism: Secondary | ICD-10-CM | POA: Insufficient documentation

## 2013-07-13 DIAGNOSIS — J3489 Other specified disorders of nose and nasal sinuses: Secondary | ICD-10-CM | POA: Insufficient documentation

## 2013-07-13 DIAGNOSIS — Z872 Personal history of diseases of the skin and subcutaneous tissue: Secondary | ICD-10-CM | POA: Insufficient documentation

## 2013-07-13 DIAGNOSIS — K219 Gastro-esophageal reflux disease without esophagitis: Secondary | ICD-10-CM | POA: Insufficient documentation

## 2013-07-13 DIAGNOSIS — R42 Dizziness and giddiness: Secondary | ICD-10-CM | POA: Insufficient documentation

## 2013-07-13 DIAGNOSIS — Z79899 Other long term (current) drug therapy: Secondary | ICD-10-CM | POA: Insufficient documentation

## 2013-07-13 DIAGNOSIS — K649 Unspecified hemorrhoids: Secondary | ICD-10-CM

## 2013-07-13 DIAGNOSIS — G43909 Migraine, unspecified, not intractable, without status migrainosus: Secondary | ICD-10-CM | POA: Insufficient documentation

## 2013-07-13 DIAGNOSIS — K089 Disorder of teeth and supporting structures, unspecified: Secondary | ICD-10-CM | POA: Insufficient documentation

## 2013-07-13 DIAGNOSIS — IMO0002 Reserved for concepts with insufficient information to code with codable children: Secondary | ICD-10-CM | POA: Insufficient documentation

## 2013-07-13 DIAGNOSIS — K644 Residual hemorrhoidal skin tags: Secondary | ICD-10-CM | POA: Insufficient documentation

## 2013-07-13 DIAGNOSIS — G8929 Other chronic pain: Secondary | ICD-10-CM | POA: Insufficient documentation

## 2013-07-13 DIAGNOSIS — F17200 Nicotine dependence, unspecified, uncomplicated: Secondary | ICD-10-CM | POA: Insufficient documentation

## 2013-07-13 LAB — OCCULT BLOOD, POC DEVICE: FECAL OCCULT BLD: NEGATIVE

## 2013-07-13 MED ORDER — DEXAMETHASONE SODIUM PHOSPHATE 10 MG/ML IJ SOLN
10.0000 mg | Freq: Once | INTRAMUSCULAR | Status: DC
  Filled 2013-07-13: qty 1

## 2013-07-13 MED ORDER — HYDROCORTISONE ACETATE 25 MG RE SUPP: 25.0000 mg | Freq: Two times a day (BID) | RECTAL | Status: DC

## 2013-07-13 MED ORDER — DEXAMETHASONE SODIUM PHOSPHATE 10 MG/ML IJ SOLN
10.0000 mg | Freq: Once | INTRAMUSCULAR | Status: AC
  Administered 2013-07-13: 10 mg via INTRAVENOUS

## 2013-07-13 MED ORDER — PROCHLORPERAZINE MALEATE 10 MG PO TABS: 10.0000 mg | ORAL_TABLET | Freq: Two times a day (BID) | ORAL | Status: DC | PRN

## 2013-07-13 MED ORDER — SODIUM CHLORIDE 0.9 % IV BOLUS (SEPSIS)
1000.0000 mL | Freq: Once | INTRAVENOUS | Status: AC
  Administered 2013-07-13: 1000 mL via INTRAVENOUS

## 2013-07-13 MED ORDER — PROCHLORPERAZINE EDISYLATE 5 MG/ML IJ SOLN
10.0000 mg | Freq: Once | INTRAMUSCULAR | Status: AC
  Administered 2013-07-13: 10 mg via INTRAVENOUS
  Filled 2013-07-13: qty 2

## 2013-07-13 MED ORDER — DIPHENHYDRAMINE HCL 50 MG/ML IJ SOLN
25.0000 mg | Freq: Once | INTRAMUSCULAR | Status: AC
  Administered 2013-07-13: 25 mg via INTRAVENOUS
  Filled 2013-07-13: qty 1

## 2013-07-13 MED ORDER — SUMATRIPTAN SUCCINATE 6 MG/0.5ML ~~LOC~~ SOLN
6.0000 mg | Freq: Once | SUBCUTANEOUS | Status: AC
  Administered 2013-07-13: 6 mg via SUBCUTANEOUS
  Filled 2013-07-13: qty 0.5

## 2013-07-13 NOTE — ED Provider Notes (Signed)
CSN: [id number]     Arrival date & time 07/13/13  0813 History   First MD Initiated Contact with Patient 07/13/13 [phone]     Chief Complaint  Patient presents with  . Migraine  . Hemorrhoids   (Consider location/radiation/quality/duration/timing/severity/associated sxs/prior Treatment) HPI Comments: She is a 43 year old female history of migraines, chronic pain, sickle cell trait who presents today with a migraine. Her migraine has been gradually worsening over the past few hours. It is a sharp pain on the left side of her face. She woke up with this migraine. It is associated with photophobia, blurry vision, nausea, vomiting. She last vomited just prior to arrival. She has not vomited since she has been here. She took a nasal spray for congestion, but no other medications. This feels like her typical migraine. She normally takes Imitrex. She has run out of her Imitrex, but wants to tell her doctor that she needs a stronger medication. Additionally she complained of a hemorrhoid which has been present for the past 5 days. The pain is worse when she is walking. It is a burning, sharp pain. She denies any bright red blood per rectum or melena. She denies any history of constipation or straining with bowel movements. She had this a few weeks ago, but it went away on its own. She denies fevers, chills, shortness of breath, chest pain.  Patient is a 43 y.o. female presenting with migraines. The history is provided by the patient. No language interpreter was used.  Migraine Associated symptoms include congestion, headaches, nausea and vomiting. Pertinent negatives include no abdominal pain, chest pain, chills, fever or sore throat.    Past Medical History  Diagnosis Date  . Migraine   . GERD (gastroesophageal reflux disease)   . Ulcer   . Toothache   . Chronic pain   . Bronchitis   . Sickle cell trait    Past Surgical History  Procedure Laterality Date  . Myringotomy      age 79  .  Esophagogastroduodenoscopy (egd) with propofol N/A 04/04/2013    Procedure: ESOPHAGOGASTRODUODENOSCOPY (EGD) WITH PROPOFOL;  Surgeon: Willis Modena, MD;  Location: [facility] ENDOSCOPY;  Service: Endoscopy;  Laterality: N/A;   History reviewed. No pertinent family history. History  Substance Use Topics  . Smoking status: Current Every Day Smoker -- 0.50 packs/day for 15 years    Types: Cigarettes  . Smokeless tobacco: Never Used  . Alcohol Use: Yes     Comment: overy rare   OB History   Grav Para Term Preterm Abortions TAB SAB Ect Mult Living                 Review of Systems  Constitutional: Negative for fever and chills.  HENT: Positive for congestion and dental problem. Negative for sore throat.   Eyes: Positive for photophobia and visual disturbance.  Respiratory: Negative for shortness of breath.   Cardiovascular: Negative for chest pain.  Gastrointestinal: Positive for nausea and vomiting. Negative for abdominal pain.  Neurological: Positive for light-headedness and headaches.  All other systems reviewed and are negative.    Allergies  Hydrocodone-acetaminophen  Home Medications   Current Outpatient Rx  Name  Route  Sig  Dispense  Refill  . albuterol (PROVENTIL HFA;VENTOLIN HFA) 108 (90 BASE) MCG/ACT inhaler   Inhalation   Inhale 2 puffs into the lungs every 4 (four) hours as needed for wheezing or shortness of breath (cough).   1 Inhaler   0   . esomeprazole (NEXIUM) 40 MG capsule  Oral   Take 1 capsule (40 mg total) by mouth 2 (two) times daily before a meal.         . fluticasone (FLONASE) 50 MCG/ACT nasal spray   Each Nare   Place 1 spray into both nostrils daily.   16 g   0   . ibuprofen (ADVIL,MOTRIN) 200 MG tablet   Oral   Take 400 mg by mouth every 6 (six) hours as needed (pain).         . pseudoephedrine (SUDAFED) 30 MG tablet   Oral   Take 1 tablet (30 mg total) by mouth every 6 (six) hours as needed for congestion.   30 tablet   0   .  SUMAtriptan (IMITREX) 6 MG/0.5ML SOLN injection   Subcutaneous   Inject 6 mg into the skin every 2 (two) hours as needed for migraine or headache. May repeat in 2 hours if headache persists or recurs.          BP 112/75  Pulse 80  Temp(Src) 97.7 F (36.5 C) (Oral)  Resp 12  Ht 5\' 5"  (1.651 m)  Wt 120 lb (54.432 kg)  BMI 19.97 kg/m2  SpO2 100%  LMP 07/12/2013 Physical Exam  Nursing note and vitals reviewed. Constitutional: She is oriented to person, place, and time. She appears well-developed and well-nourished. She appears distressed.  HENT:  Head: Normocephalic and atraumatic.  Right Ear: External ear normal.  Left Ear: External ear normal.  Nose: Nose normal.  Mouth/Throat: Uvula is midline, oropharynx is clear and moist and mucous membranes are normal.  ttp over left side of head. No temporal artery tenderness.   Eyes: Conjunctivae and EOM are normal. Pupils are equal, round, and reactive to light.  Pupils at 5mm bilaterally, reactive to light  Neck: Normal range of motion.  No nuchal rigidity or meningeal signs  Cardiovascular: Normal rate, regular rhythm and normal heart sounds.   Pulmonary/Chest: Effort normal and breath sounds normal. No stridor. No respiratory distress. She has no wheezes. She has no rales.  Abdominal: Soft. She exhibits no distension. There is no tenderness. There is no rebound and no guarding.  Genitourinary: Rectal exam shows external hemorrhoid and tenderness. Rectal exam shows no mass and anal tone normal. Guaiac negative stool.  Small amount of light brown stool seen on rectal exam. No induration, ulceration  Musculoskeletal: Normal range of motion.  Neurological: She is alert and oriented to person, place, and time. She has normal strength. Coordination and gait normal.  No pronator drift. Finger nose finger normal, heel knee shin normal. Grip strength 5/5 bilaterally.   Skin: Skin is warm and dry. She is not diaphoretic. No erythema.   Psychiatric: She has a normal mood and affect. Her behavior is normal.    ED Course  Procedures (including critical care time) Labs Review Labs Reviewed  OCCULT BLOOD, POC DEVICE   Imaging Review No results found.  EKG Interpretation   None       MDM   1. Migraine   2. Hemorrhoid    Pt HA treated and improved while in ED.  Presentation is like pts typical HA and non concerning for [facility], ICH, Meningitis, or temporal arteritis. Pt is afebrile with no focal neuro deficits, nuchal rigidity, or change in vision. Pt is to follow up with PCP to discuss prophylactic medication. Pt verbalizes understanding and is agreeable with plan to dc.   Pt also with complaint of external hemorrhoids. Home care discussed including sitz baths 15  min TID. Dc with Anusol follow up with PCP. Pt w normal VS and in NAD prior to dc.   Mora BellmanHannah S Laia Wiley, PA-C 07/13/13 2025

## 2013-07-13 NOTE — Discharge Instructions (Signed)
Migraine Headache A migraine headache is an intense, throbbing pain on one or both sides of your head. A migraine can last for 30 minutes to several hours. CAUSES  The exact cause of a migraine headache is not always known. However, a migraine may be caused when nerves in the brain become irritated and release chemicals that cause inflammation. This causes pain. Certain things may also trigger migraines, such as:  Alcohol.  Smoking.  Stress.  Menstruation.  Aged cheeses.  Foods or drinks that contain nitrates, glutamate, aspartame, or tyramine.  Lack of sleep.  Chocolate.  Caffeine.  Hunger.  Physical exertion.  Fatigue.  Medicines used to treat chest pain (nitroglycerine), birth control pills, estrogen, and some blood pressure medicines. SIGNS AND SYMPTOMS  Pain on one or both sides of your head.  Pulsating or throbbing pain.  Severe pain that prevents daily activities.  Pain that is aggravated by any physical activity.  Nausea, vomiting, or both.  Dizziness.  Pain with exposure to bright lights, loud noises, or activity.  General sensitivity to bright lights, loud noises, or smells. Before you get a migraine, you may get warning signs that a migraine is coming (aura). An aura may include:  Seeing flashing lights.  Seeing bright spots, halos, or zig-zag lines.  Having tunnel vision or blurred vision.  Having feelings of numbness or tingling.  Having trouble talking.  Having muscle weakness. DIAGNOSIS  A migraine headache is often diagnosed based on:  Symptoms.  Physical exam.  A CT scan or MRI of your head. These imaging tests cannot diagnose migraines, but they can help rule out other causes of headaches. TREATMENT Medicines may be given for pain and nausea. Medicines can also be given to help prevent recurrent migraines.  HOME CARE INSTRUCTIONS  Only take over-the-counter or prescription medicines for pain or discomfort as directed by your  health care provider. The use of long-term narcotics is not recommended.  Lie down in a dark, quiet room when you have a migraine.  Keep a journal to find out what may trigger your migraine headaches. For example, write down:  What you eat and drink.  How much sleep you get.  Any change to your diet or medicines.  Limit alcohol consumption.  Quit smoking if you smoke.  Get 7 9 hours of sleep, or as recommended by your health care provider.  Limit stress.  Keep lights dim if bright lights bother you and make your migraines worse. SEEK IMMEDIATE MEDICAL CARE IF:   Your migraine becomes severe.  You have a fever.  You have a stiff neck.  You have vision loss.  You have muscular weakness or loss of muscle control.  You start losing your balance or have trouble walking.  You feel faint or pass out.  You have severe symptoms that are different from your first symptoms. MAKE SURE YOU:   Understand these instructions.  Will watch your condition.  Will get help right away if you are not doing well or get worse. Document Released: 06/02/2005 Document Revised: 03/23/2013 Document Reviewed: 02/07/2013 [facility] Patient Information 2014 [place], [place].  Hemorrhoids Hemorrhoids are puffy (swollen) veins around the rectum or anus. Hemorrhoids can cause pain, itching, bleeding, or irritation. HOME CARE  Eat foods with fiber, such as whole grains, beans, nuts, fruits, and vegetables. Ask your doctor about taking products with added fiber in them (fibersupplements).  Drink enough fluid to keep your pee (urine) clear or pale yellow.  Exercise often.  Go to the  bathroom when you have the urge to poop. Do not wait.  Avoid straining to poop (bowel movement).  Keep the butt area dry and clean. Use wet toilet paper or moist paper towels.  Medicated creams and medicine inserted into the anus (anal suppository) may be used or applied as told.  Only take medicine as told by  your doctor.  Take a warm water bath (sitz bath) for 15 20 minutes to ease pain. Do this 3 4 times a day.  Place ice packs on the area if it is tender or puffy. Use the ice packs between the warm water baths.  Put ice in a plastic bag.  Place a towel between your skin and the bag.  Leave the ice on for 15-20 minutes, 03-04 times a day.  Do not use a donut-shaped pillow or sit on the toilet for a long time. GET HELP RIGHT AWAY IF:   You have more pain that is not controlled by treatment or medicine.  You have bleeding that will not stop.  You have trouble or are unable to poop (bowel movement).  You have pain or puffiness outside the area of the hemorrhoids. MAKE SURE YOU:   Understand these instructions.  Will watch your condition.  Will get help right away if you are not doing well or get worse. Document Released: 03/11/2008 Document Revised: 05/19/2012 Document Reviewed: 04/13/2012 [facility] Patient Information 2014 [place].

## 2013-07-13 NOTE — ED Notes (Signed)
MD at bedside. 

## 2013-07-13 NOTE — ED Notes (Addendum)
Patient states she woke up this morning with a migraine. Complaining of sharp, constant pain 10/10. Patient states she has a history of migraines, but she ran out  of her prescription. Patient has nausea and vomiting x3 this morning and reports intermittent dizziness. Complains of sensitivity to light and sound. Patient complains of hemorrhoids starting five days ago. Denies and blood in stool and reports "a bulge down there."

## 2013-07-15 NOTE — ED Provider Notes (Signed)
Medical screening examination/treatment/procedure(s) were conducted as a shared visit with non-physician practitioner(s) and myself.  I personally evaluated the patient during the encounter.  EKG Interpretation   None       Patient seen and evaluated. Reviewed her history. On exam she appears to have the inflamed, but not thrombosed, bleeding, or ulcerated external hemorrhoid. Sent back treatment. Sitz baths. Anusol-HC.  Primary care physician followup  Rolland PorterMark Omega Durante, MD 07/15/13 1544

## 2013-07-29 ENCOUNTER — Encounter (INDEPENDENT_AMBULATORY_CARE_PROVIDER_SITE_OTHER): Payer: Self-pay | Admitting: Surgery

## 2013-07-29 ENCOUNTER — Ambulatory Visit (INDEPENDENT_AMBULATORY_CARE_PROVIDER_SITE_OTHER): Payer: PRIVATE HEALTH INSURANCE | Admitting: Surgery

## 2013-07-29 VITALS — BP 126/81 | HR 66 | Temp 98.6°F | Resp 18 | Ht 64.0 in | Wt 129.6 lb

## 2013-07-29 DIAGNOSIS — K6289 Other specified diseases of anus and rectum: Secondary | ICD-10-CM

## 2013-07-29 NOTE — Patient Instructions (Signed)
Hemorrhoids Hemorrhoids are swollen veins around the rectum or anus. There are two types of hemorrhoids:   Internal hemorrhoids. These occur in the veins just inside the rectum. They may poke through to the outside and become irritated and painful.  External hemorrhoids. These occur in the veins outside the anus and can be felt as a painful swelling or hard lump near the anus. CAUSES  Pregnancy.   Obesity.   Constipation or diarrhea.   Straining to have a bowel movement.   Sitting for long periods on the toilet.  Heavy lifting or other activity that caused you to strain.  Anal intercourse. SYMPTOMS   Pain.   Anal itching or irritation.   Rectal bleeding.   Fecal leakage.   Anal swelling.   One or more lumps around the anus.  DIAGNOSIS  Your caregiver may be able to diagnose hemorrhoids by visual examination. Other examinations or tests that may be performed include:   Examination of the rectal area with a gloved hand (digital rectal exam).   Examination of anal canal using a small tube (scope).   A blood test if you have lost a significant amount of blood.  A test to look inside the colon (sigmoidoscopy or colonoscopy). TREATMENT Most hemorrhoids can be treated at home. However, if symptoms do not seem to be getting better or if you have a lot of rectal bleeding, your caregiver may perform a procedure to help make the hemorrhoids get smaller or remove them completely. Possible treatments include:   Placing a rubber band at the base of the hemorrhoid to cut off the circulation (rubber band ligation).   Injecting a chemical to shrink the hemorrhoid (sclerotherapy).   Using a tool to burn the hemorrhoid (infrared light therapy).   Surgically removing the hemorrhoid (hemorrhoidectomy).   Stapling the hemorrhoid to block blood flow to the tissue (hemorrhoid stapling).  HOME CARE INSTRUCTIONS   Eat foods with fiber, such as whole grains, beans,  nuts, fruits, and vegetables. Ask your doctor about taking products with added fiber in them (fibersupplements).  Increase fluid intake. Drink enough water and fluids to keep your urine clear or pale yellow.   Exercise regularly.   Go to the bathroom when you have the urge to have a bowel movement. Do not wait.   Avoid straining to have bowel movements.   Keep the anal area dry and clean. Use wet toilet paper or moist towelettes after a bowel movement.   Medicated creams and suppositories may be used or applied as directed.   Only take over-the-counter or prescription medicines as directed by your caregiver.   Take warm sitz baths for 15 20 minutes, 3 4 times a day to ease pain and discomfort.   Place ice packs on the hemorrhoids if they are tender and swollen. Using ice packs between sitz baths may be helpful.   Put ice in a plastic bag.   Place a towel between your skin and the bag.   Leave the ice on for 15 20 minutes, 3 4 times a day.   Do not use a donut-shaped pillow or sit on the toilet for long periods. This increases blood pooling and pain.  SEEK MEDICAL CARE IF:  You have increasing pain and swelling that is not controlled by treatment or medicine.  You have uncontrolled bleeding.  You have difficulty or you are unable to have a bowel movement.  You have pain or inflammation outside the area of the hemorrhoids. MAKE SURE YOU:    Understand these instructions.  Will watch your condition.  Will get help right away if you are not doing well or get worse. Document Released: 05/30/2000 Document Revised: 05/19/2012 Document Reviewed: 04/06/2012 [employer] Patient Information 2014 [employer].  

## 2013-07-29 NOTE — Progress Notes (Signed)
Patient ID: Jenny Evans, female   DOB: 1971-06-07, 43 y.o.   MRN: [id number]  Chief Complaint  Patient presents with  . Rectal Problems    HPI Jenny Evans is a 43 y.o. female.  Patient sent at the request of Cammie Fulp for anal pain, bleeding, and possible hemorrhoid disease. She has had constipation for the last 2 months. After bowel movement she notices swelling and protuberance of tissue. She has anal pain is well after defecation and bleeding. The pain is not severe though. This has been going on for 2 months. Problems with constipation noted. Some blood when she wipes but not her stool. History of anal fissure. HPI  Past Medical History  Diagnosis Date  . Migraine   . GERD (gastroesophageal reflux disease)   . Ulcer   . Toothache   . Chronic pain   . Bronchitis   . Sickle cell trait     Past Surgical History  Procedure Laterality Date  . Myringotomy      age 70  . Esophagogastroduodenoscopy (egd) with propofol N/A 04/04/2013    Procedure: ESOPHAGOGASTRODUODENOSCOPY (EGD) WITH PROPOFOL;  Surgeon: Willis Modena, MD;  Location: [facility];  Service: Endoscopy;  Laterality: N/A;    History reviewed. No pertinent family history.  Social History History  Substance Use Topics  . Smoking status: Current Every Day Smoker -- 0.50 packs/day for 15 years    Types: Cigarettes  . Smokeless tobacco: Never Used  . Alcohol Use: Yes     Comment: overy rare    Allergies  Allergen Reactions  . Hydrocodone-Acetaminophen Nausea And Vomiting    Current Outpatient Prescriptions  Medication Sig Dispense Refill  . albuterol (PROVENTIL HFA;VENTOLIN HFA) 108 (90 BASE) MCG/ACT inhaler Inhale 2 puffs into the lungs every 4 (four) hours as needed for wheezing or shortness of breath (cough).  1 Inhaler  0  . esomeprazole (NEXIUM) 40 MG capsule Take 1 capsule (40 mg total) by mouth 2 (two) times daily before a meal.      . fluticasone (FLONASE) 50 MCG/ACT nasal spray Place 1 spray  into both nostrils daily.  16 g  0  . hydrocortisone (ANUSOL-HC) 25 MG suppository Place 1 suppository (25 mg total) rectally 2 (two) times daily. For 7 days  14 suppository  0  . ibuprofen (ADVIL,MOTRIN) 200 MG tablet Take 400 mg by mouth every 6 (six) hours as needed (pain).      . prochlorperazine (COMPAZINE) 10 MG tablet Take 1 tablet (10 mg total) by mouth 2 (two) times daily as needed for nausea or vomiting (Nausea ).  10 tablet  0  . pseudoephedrine (SUDAFED) 30 MG tablet Take 1 tablet (30 mg total) by mouth every 6 (six) hours as needed for congestion.  30 tablet  0  . SUMAtriptan (IMITREX) 6 MG/0.5ML SOLN injection Inject 6 mg into the skin every 2 (two) hours as needed for migraine or headache. May repeat in 2 hours if headache persists or recurs.       No current facility-administered medications for this visit.    Review of Systems Review of Systems  Constitutional: Positive for fatigue.  HENT: Negative.   Eyes: Negative.   Respiratory: Positive for cough.   Cardiovascular: Negative.   Gastrointestinal: Positive for anal bleeding and rectal pain.  Musculoskeletal: Negative.   Skin: Negative.   Hematological: Negative.   Psychiatric/Behavioral: Negative.     Blood pressure 126/81, pulse 66, temperature 98.6 F (37 C), temperature source Temporal, resp. rate  18, height 5\' 4"  (1.626 m), weight 129 lb 9.6 oz (58.786 kg), last menstrual period 07/12/2013.  Physical Exam Physical Exam  Constitutional: She appears well-developed and well-nourished.  HENT:  Head: Normocephalic and atraumatic.  Eyes: Pupils are equal, round, and reactive to light. No scleral icterus.  Neck: Normal range of motion. Neck supple.  Cardiovascular: Normal rate and regular rhythm.   Pulmonary/Chest: Effort normal and breath sounds normal.  Abdominal: Soft. There is no tenderness.  Genitourinary:       Data Reviewed ED records  Assessment    Rectal/anal canal pain, swelling with history of  constipation without significant hemorrhoid prolapse    Plan    This exam under anesthesia. She has not had any significant prolapse or other sign of hemorrhoid disease on exam. She has some element of pelvic descent.The procedure has been discussed with the patient.  Alternative therapies have been discussed with the patient.  Operative risks include bleeding,  Infection,  Organ injury,  Incontinence pain and need for more surgery Nerve injury,  Blood vessel injury,  DVT,  Pulmonary embolism,  Death,  And possible reoperation.  Medical management risks include worsening of present situation.  The success of the procedure is 50 -90 % at treating patients symptoms.  The patient understands and agrees to proceed.       Oaklyn Mans A. 07/29/2013, 10:37 AM

## 2013-08-04 ENCOUNTER — Ambulatory Visit (INDEPENDENT_AMBULATORY_CARE_PROVIDER_SITE_OTHER): Payer: Managed Care, Other (non HMO) | Admitting: General Surgery

## 2013-08-05 ENCOUNTER — Encounter (HOSPITAL_BASED_OUTPATIENT_CLINIC_OR_DEPARTMENT_OTHER): Payer: Self-pay | Admitting: *Deleted

## 2013-08-05 NOTE — Progress Notes (Signed)
Talked with pt and her friend-will come in for [facility] labs-was also told to do fleets enema

## 2013-08-09 ENCOUNTER — Encounter (HOSPITAL_BASED_OUTPATIENT_CLINIC_OR_DEPARTMENT_OTHER)
Admission: RE | Admit: 2013-08-09 | Discharge: 2013-08-09 | Disposition: A | Discharge status: Home / Self Care | Payer: 59 | Source: Ambulatory Visit | Attending: Surgery | Admitting: Surgery

## 2013-08-09 LAB — CBC WITH DIFFERENTIAL/PLATELET
BASOS ABS: 0 10*3/uL (ref 0.0–0.1)
Basophils Relative: 0 % (ref 0–1)
EOS PCT: 2 % (ref 0–5)
Eosinophils Absolute: 0.1 10*3/uL (ref 0.0–0.7)
HCT: 32.8 % — ABNORMAL LOW (ref 36.0–46.0)
Hemoglobin: 11.7 g/dL — ABNORMAL LOW (ref 12.0–15.0)
Lymphocytes Relative: 34 % (ref 12–46)
Lymphs Abs: 2.4 10*3/uL (ref 0.7–4.0)
MCH: 31.5 pg (ref 26.0–34.0)
MCHC: 35.7 g/dL (ref 30.0–36.0)
MCV: 88.2 fL (ref 78.0–100.0)
MONO ABS: 0.6 10*3/uL (ref 0.1–1.0)
Monocytes Relative: 9 % (ref 3–12)
NEUTROS ABS: 4 10*3/uL (ref 1.7–7.7)
Neutrophils Relative %: 56 % (ref 43–77)
PLATELETS: 347 10*3/uL (ref 150–400)
RBC: 3.72 MIL/uL — ABNORMAL LOW (ref 3.87–5.11)
RDW: 13.5 % (ref 11.5–15.5)
WBC: 7.1 10*3/uL (ref 4.0–10.5)

## 2013-08-09 LAB — COMPREHENSIVE METABOLIC PANEL
ALT: 13 U/L (ref 0–35)
AST: 19 U/L (ref 0–37)
Albumin: 3.9 g/dL (ref 3.5–5.2)
Alkaline Phosphatase: 63 U/L (ref 39–117)
BUN: 7 mg/dL (ref 6–23)
CALCIUM: 9.7 mg/dL (ref 8.4–10.5)
CO2: 28 mEq/L (ref 19–32)
CREATININE: 0.59 mg/dL (ref 0.50–1.10)
Chloride: 101 mEq/L (ref 96–112)
GFR calc Af Amer: >90 mL/min (ref >90)
GFR calc non Af Amer: >90 mL/min (ref >90)
Glucose, Bld: 107 mg/dL — ABNORMAL HIGH (ref 70–99)
Potassium: 3.9 mEq/L (ref 3.7–5.3)
Sodium: 139 mEq/L (ref 137–147)
Total Bilirubin: 0.2 mg/dL — ABNORMAL LOW (ref 0.3–1.2)
Total Protein: 7.1 g/dL (ref 6.0–8.3)

## 2013-08-10 ENCOUNTER — Encounter (HOSPITAL_BASED_OUTPATIENT_CLINIC_OR_DEPARTMENT_OTHER)
Admission: RE | Disposition: A | Discharge status: Home / Self Care | Payer: Self-pay | Source: Ambulatory Visit | Attending: Surgery

## 2013-08-10 ENCOUNTER — Encounter (HOSPITAL_BASED_OUTPATIENT_CLINIC_OR_DEPARTMENT_OTHER): Payer: 59 | Admitting: Anesthesiology

## 2013-08-10 ENCOUNTER — Encounter (HOSPITAL_BASED_OUTPATIENT_CLINIC_OR_DEPARTMENT_OTHER): Payer: Self-pay | Admitting: *Deleted

## 2013-08-10 ENCOUNTER — Other Ambulatory Visit (INDEPENDENT_AMBULATORY_CARE_PROVIDER_SITE_OTHER): Payer: Self-pay

## 2013-08-10 ENCOUNTER — Ambulatory Visit (HOSPITAL_BASED_OUTPATIENT_CLINIC_OR_DEPARTMENT_OTHER)
Admission: RE | Admit: 2013-08-10 | Discharge: 2013-08-10 | Disposition: A | Discharge status: Home / Self Care | Payer: 59 | Source: Ambulatory Visit | Attending: Surgery | Admitting: Surgery

## 2013-08-10 ENCOUNTER — Ambulatory Visit (HOSPITAL_BASED_OUTPATIENT_CLINIC_OR_DEPARTMENT_OTHER): Payer: 59 | Admitting: Anesthesiology

## 2013-08-10 DIAGNOSIS — N816 Rectocele: Secondary | ICD-10-CM

## 2013-08-10 DIAGNOSIS — K6289 Other specified diseases of anus and rectum: Secondary | ICD-10-CM

## 2013-08-10 DIAGNOSIS — K648 Other hemorrhoids: Secondary | ICD-10-CM

## 2013-08-10 DIAGNOSIS — F17200 Nicotine dependence, unspecified, uncomplicated: Secondary | ICD-10-CM | POA: Insufficient documentation

## 2013-08-10 DIAGNOSIS — D573 Sickle-cell trait: Secondary | ICD-10-CM | POA: Insufficient documentation

## 2013-08-10 DIAGNOSIS — K5900 Constipation, unspecified: Secondary | ICD-10-CM | POA: Insufficient documentation

## 2013-08-10 DIAGNOSIS — G43909 Migraine, unspecified, not intractable, without status migrainosus: Secondary | ICD-10-CM | POA: Insufficient documentation

## 2013-08-10 DIAGNOSIS — K219 Gastro-esophageal reflux disease without esophagitis: Secondary | ICD-10-CM | POA: Insufficient documentation

## 2013-08-10 HISTORY — DX: Unspecified asthma, uncomplicated: J45.909

## 2013-08-10 HISTORY — PX: EXAMINATION UNDER ANESTHESIA: SHX1540

## 2013-08-10 SURGERY — EXAM UNDER ANESTHESIA
Anesthesia: General | Site: Rectum

## 2013-08-10 MED ORDER — ONDANSETRON HCL 4 MG/2ML IJ SOLN
4.0000 mg | Freq: Once | INTRAMUSCULAR | Status: DC | PRN
Start: 2013-08-10 — End: 2013-08-10

## 2013-08-10 MED ORDER — LACTATED RINGERS IV SOLN
INTRAVENOUS | Status: DC
  Administered 2013-08-10: 11:00:00 via INTRAVENOUS

## 2013-08-10 MED ORDER — FENTANYL CITRATE 0.05 MG/ML IJ SOLN: 50.0000 ug | INTRAMUSCULAR | Status: DC | PRN

## 2013-08-10 MED ORDER — LIDOCAINE HCL (CARDIAC) 20 MG/ML IV SOLN
INTRAVENOUS | Status: DC | PRN
  Administered 2013-08-10: 50 mg via INTRAVENOUS

## 2013-08-10 MED ORDER — FENTANYL CITRATE 0.05 MG/ML IJ SOLN
INTRAMUSCULAR | Status: AC
  Filled 2013-08-10: qty 6

## 2013-08-10 MED ORDER — OXYCODONE HCL 5 MG/5ML PO SOLN: 5.0000 mg | Freq: Once | ORAL | Status: AC | PRN

## 2013-08-10 MED ORDER — SODIUM CHLORIDE 0.9 % IJ SOLN
INTRAMUSCULAR | Status: DC | PRN
  Administered 2013-08-10: 13:00:00

## 2013-08-10 MED ORDER — HYDROMORPHONE HCL PF 1 MG/ML IJ SOLN
INTRAMUSCULAR | Status: AC
  Filled 2013-08-10: qty 1

## 2013-08-10 MED ORDER — MIDAZOLAM HCL 2 MG/2ML IJ SOLN: 1.0000 mg | INTRAMUSCULAR | Status: DC | PRN

## 2013-08-10 MED ORDER — MIDAZOLAM HCL 5 MG/5ML IJ SOLN
INTRAMUSCULAR | Status: DC | PRN
  Administered 2013-08-10: 2 mg via INTRAVENOUS

## 2013-08-10 MED ORDER — PROPOFOL 10 MG/ML IV BOLUS
INTRAVENOUS | Status: DC | PRN
  Administered 2013-08-10: 200 mg via INTRAVENOUS

## 2013-08-10 MED ORDER — CHLORHEXIDINE GLUCONATE 4 % EX LIQD: 1.0000 "application " | Freq: Once | CUTANEOUS | Status: DC

## 2013-08-10 MED ORDER — DIBUCAINE 1 % RE OINT
TOPICAL_OINTMENT | RECTAL | Status: AC
  Filled 2013-08-10: qty 28

## 2013-08-10 MED ORDER — OXYCODONE-ACETAMINOPHEN 5-325 MG PO TABS: 1.0000 | ORAL_TABLET | ORAL | Status: DC | PRN

## 2013-08-10 MED ORDER — FENTANYL CITRATE 0.05 MG/ML IJ SOLN
INTRAMUSCULAR | Status: DC | PRN
  Administered 2013-08-10 (×2): 50 ug via INTRAVENOUS

## 2013-08-10 MED ORDER — BUPIVACAINE-EPINEPHRINE PF 0.25-1:200000 % IJ SOLN
INTRAMUSCULAR | Status: AC
  Filled 2013-08-10: qty 30

## 2013-08-10 MED ORDER — FLEET ENEMA 7-19 GM/118ML RE ENEM
1.0000 | ENEMA | Freq: Once | RECTAL | Status: AC
  Administered 2013-08-09: 1 via RECTAL

## 2013-08-10 MED ORDER — CEFAZOLIN SODIUM-DEXTROSE 2-3 GM-% IV SOLR
INTRAVENOUS | Status: AC
  Filled 2013-08-10: qty 50

## 2013-08-10 MED ORDER — MIDAZOLAM HCL 2 MG/2ML IJ SOLN
INTRAMUSCULAR | Status: AC
  Filled 2013-08-10: qty 2

## 2013-08-10 MED ORDER — HYDROMORPHONE HCL PF 1 MG/ML IJ SOLN
0.2500 mg | INTRAMUSCULAR | Status: DC | PRN
  Administered 2013-08-10 (×2): 0.25 mg via INTRAVENOUS

## 2013-08-10 MED ORDER — MEPERIDINE HCL 25 MG/ML IJ SOLN: 6.2500 mg | INTRAMUSCULAR | Status: DC | PRN

## 2013-08-10 MED ORDER — OXYCODONE HCL 5 MG PO TABS
5.0000 mg | ORAL_TABLET | Freq: Once | ORAL | Status: AC | PRN
  Administered 2013-08-10: 5 mg via ORAL

## 2013-08-10 MED ORDER — BUPIVACAINE LIPOSOME 1.3 % IJ SUSP
INTRAMUSCULAR | Status: AC
  Filled 2013-08-10: qty 20

## 2013-08-10 MED ORDER — CEFAZOLIN SODIUM-DEXTROSE 2-3 GM-% IV SOLR
2.0000 g | INTRAVENOUS | Status: AC
  Administered 2013-08-10: 2 g via INTRAVENOUS

## 2013-08-10 MED ORDER — DEXAMETHASONE SODIUM PHOSPHATE 4 MG/ML IJ SOLN
INTRAMUSCULAR | Status: DC | PRN
  Administered 2013-08-10: 10 mg via INTRAVENOUS

## 2013-08-10 MED ORDER — SODIUM CHLORIDE 0.9 % IJ SOLN
INTRAMUSCULAR | Status: AC
  Filled 2013-08-10: qty 20

## 2013-08-10 MED ORDER — OXYCODONE HCL 5 MG PO TABS
ORAL_TABLET | ORAL | Status: AC
  Filled 2013-08-10: qty 1

## 2013-08-10 SURGICAL SUPPLY — 38 items
BLADE SURG 15 STRL LF DISP TIS (BLADE) ×1 IMPLANT
BLADE SURG 15 STRL SS (BLADE) ×2
BRIEF STRETCH FOR OB PAD LRG (UNDERPADS AND DIAPERS) ×1 IMPLANT
CANISTER SUCT 1200ML W/VALVE (MISCELLANEOUS) ×2 IMPLANT
COVER MAYO STAND STRL (DRAPES) IMPLANT
DECANTER SPIKE VIAL GLASS SM (MISCELLANEOUS) ×1 IMPLANT
DRAPE UTILITY XL STRL (DRAPES) ×2 IMPLANT
ELECT REM PT RETURN 9FT ADLT (ELECTROSURGICAL) ×2
ELECTRODE REM PT RTRN 9FT ADLT (ELECTROSURGICAL) IMPLANT
FILTER 7/8 IN (FILTER) IMPLANT
GLOVE BIOGEL PI IND STRL 8 (GLOVE) ×1 IMPLANT
GLOVE BIOGEL PI INDICATOR 8 (GLOVE) ×1
GLOVE ECLIPSE 8.0 STRL XLNG CF (GLOVE) ×2 IMPLANT
GOWN STRL REUS W/ TWL LRG LVL3 (GOWN DISPOSABLE) ×2 IMPLANT
GOWN STRL REUS W/TWL LRG LVL3 (GOWN DISPOSABLE) ×4
NDL HYPO 25X1 1.5 SAFETY (NEEDLE) ×1 IMPLANT
NDL SAFETY ECLIPSE 18X1.5 (NEEDLE) IMPLANT
NEEDLE HYPO 18GX1.5 SHARP (NEEDLE) ×2
NEEDLE HYPO 25X1 1.5 SAFETY (NEEDLE) ×2 IMPLANT
PACK BASIN DAY SURGERY FS (CUSTOM PROCEDURE TRAY) ×2 IMPLANT
PACK LITHOTOMY IV (CUSTOM PROCEDURE TRAY) ×2 IMPLANT
PAD ABD 8X10 STRL (GAUZE/BANDAGES/DRESSINGS) ×1 IMPLANT
PENCIL BUTTON HOLSTER BLD 10FT (ELECTRODE) IMPLANT
SHEET MEDIUM DRAPE 40X70 STRL (DRAPES) IMPLANT
SPONGE GAUZE 4X4 12PLY STER LF (GAUZE/BANDAGES/DRESSINGS) ×2 IMPLANT
SPONGE SURGIFOAM ABS GEL 12-7 (HEMOSTASIS) IMPLANT
SURGILUBE 2OZ TUBE FLIPTOP (MISCELLANEOUS) ×2 IMPLANT
SUT CHROMIC 3 0 SH 27 (SUTURE) IMPLANT
SYR 20CC LL (SYRINGE) ×1 IMPLANT
SYR CONTROL 10ML LL (SYRINGE) ×2 IMPLANT
TOWEL OR 17X24 6PK STRL BLUE (TOWEL DISPOSABLE) ×3 IMPLANT
TOWEL OR NON WOVEN STRL DISP B (DISPOSABLE) ×1 IMPLANT
TRAY DSU PREP LF (CUSTOM PROCEDURE TRAY) ×2 IMPLANT
TRAY PROCTOSCOPIC FIBER OPTIC (SET/KITS/TRAYS/PACK) IMPLANT
TUBE CONNECTING 20X1/4 (TUBING) ×2 IMPLANT
TUBING STERILE (MISCELLANEOUS) IMPLANT
UNDERPAD 30X30 INCONTINENT (UNDERPADS AND DIAPERS) ×2 IMPLANT
YANKAUER SUCT BULB TIP NO VENT (SUCTIONS) ×2 IMPLANT

## 2013-08-10 NOTE — H&P (View-Only) (Signed)
Patient ID: Jenny Evans, female   DOB: 1971-06-07, 43 y.o.   MRN: [id number]  Chief Complaint  Patient presents with  . Rectal Problems    HPI Jenny Evans is a 43 y.o. female.  Patient sent at the request of Jenny Evans for anal pain, bleeding, and possible hemorrhoid disease. She has had constipation for the last 2 months. After bowel movement she notices swelling and protuberance of tissue. She has anal pain is well after defecation and bleeding. The pain is not severe though. This has been going on for 2 months. Problems with constipation noted. Some blood when she wipes but not her stool. History of anal fissure. HPI  Past Medical History  Diagnosis Date  . Migraine   . GERD (gastroesophageal reflux disease)   . Ulcer   . Toothache   . Chronic pain   . Bronchitis   . Sickle cell trait     Past Surgical History  Procedure Laterality Date  . Myringotomy      age 70  . Esophagogastroduodenoscopy (egd) with propofol N/A 04/04/2013    Procedure: ESOPHAGOGASTRODUODENOSCOPY (EGD) WITH PROPOFOL;  Surgeon: Jenny Modena, MD;  Location: [facility];  Service: Endoscopy;  Laterality: N/A;    History reviewed. No pertinent family history.  Social History History  Substance Use Topics  . Smoking status: Current Every Day Smoker -- 0.50 packs/day for 15 years    Types: Cigarettes  . Smokeless tobacco: Never Used  . Alcohol Use: Yes     Comment: overy rare    Allergies  Allergen Reactions  . Hydrocodone-Acetaminophen Nausea And Vomiting    Current Outpatient Prescriptions  Medication Sig Dispense Refill  . albuterol (PROVENTIL HFA;VENTOLIN HFA) 108 (90 BASE) MCG/ACT inhaler Inhale 2 puffs into the lungs every 4 (four) hours as needed for wheezing or shortness of breath (cough).  1 Inhaler  0  . esomeprazole (NEXIUM) 40 MG capsule Take 1 capsule (40 mg total) by mouth 2 (two) times daily before a meal.      . fluticasone (FLONASE) 50 MCG/ACT nasal spray Place 1 spray  into both nostrils daily.  16 g  0  . hydrocortisone (ANUSOL-HC) 25 MG suppository Place 1 suppository (25 mg total) rectally 2 (two) times daily. For 7 days  14 suppository  0  . ibuprofen (ADVIL,MOTRIN) 200 MG tablet Take 400 mg by mouth every 6 (six) hours as needed (pain).      . prochlorperazine (COMPAZINE) 10 MG tablet Take 1 tablet (10 mg total) by mouth 2 (two) times daily as needed for nausea or vomiting (Nausea ).  10 tablet  0  . pseudoephedrine (SUDAFED) 30 MG tablet Take 1 tablet (30 mg total) by mouth every 6 (six) hours as needed for congestion.  30 tablet  0  . SUMAtriptan (IMITREX) 6 MG/0.5ML SOLN injection Inject 6 mg into the skin every 2 (two) hours as needed for migraine or headache. May repeat in 2 hours if headache persists or recurs.       No current facility-administered medications for this visit.    Review of Systems Review of Systems  Constitutional: Positive for fatigue.  HENT: Negative.   Eyes: Negative.   Respiratory: Positive for cough.   Cardiovascular: Negative.   Gastrointestinal: Positive for anal bleeding and rectal pain.  Musculoskeletal: Negative.   Skin: Negative.   Hematological: Negative.   Psychiatric/Behavioral: Negative.     Blood pressure 126/81, pulse 66, temperature 98.6 F (37 C), temperature source Temporal, resp. rate  18, height 5\' 4"  (1.626 m), weight 129 lb 9.6 oz (58.786 kg), last menstrual period 07/12/2013.  Physical Exam Physical Exam  Constitutional: She appears well-developed and well-nourished.  HENT:  Head: Normocephalic and atraumatic.  Eyes: Pupils are equal, round, and reactive to light. No scleral icterus.  Neck: Normal range of motion. Neck supple.  Cardiovascular: Normal rate and regular rhythm.   Pulmonary/Chest: Effort normal and breath sounds normal.  Abdominal: Soft. There is no tenderness.  Genitourinary:       Data Reviewed ED records  Assessment    Rectal/anal canal pain, swelling with history of  constipation without significant hemorrhoid prolapse    Plan    This exam under anesthesia. She has not had any significant prolapse or other sign of hemorrhoid disease on exam. She has some element of pelvic descent.The procedure has been discussed with the patient.  Alternative therapies have been discussed with the patient.  Operative risks include bleeding,  Infection,  Organ injury,  Incontinence pain and need for more surgery Nerve injury,  Blood vessel injury,  DVT,  Pulmonary embolism,  Death,  And possible reoperation.  Medical management risks include worsening of present situation.  The success of the procedure is 50 -90 % at treating patients symptoms.  The patient understands and agrees to proceed.       Jenny Mans A. 07/29/2013, 10:37 AM

## 2013-08-10 NOTE — Op Note (Signed)
Preoperative diagnosis: Rectal and anal pain  Postoperative diagnosis: Large rectocele with grade 2 left lateral and right posterior columns hemorrhoid internal  Procedure: Exam under anesthesia with suture ligation of grade 2 left lateral and right posterior column hemorrhoid tissue  Surgeon: Harriette Bouillonhomas Carla Whilden M.D.  EBL: Minimal  Anesthesia: LMA with Exparel local  IV fluids: 400 cc crystalloid  Specimens: None  Indications for procedure: The patient is a 43 year old female who I saw earlier this month for rectal and anal pain. He states he is having prolapse of tissue after defecation. She's having some problems defecating with constipation she states. She's having some rectal pain is well. She was seen in the office and could not tolerate anoscopy. There is no evidence of anal fissure and I saw no significant prolapse of hemorrhoidal tissue with Valsalva maneuver. I recommended exam under anesthesia to further evaluate her anal and rectal symptoms of bleeding, burning, pain and difficulty with bowel movements. I discussed possibilities of hemorrhoidectomy or finding anal fissure. I talked about banding procedures with her is well. I also explained that nothing  may need to be done as well. After discussion of the above, she agreed to proceed.  Description of procedure: Patient and holding area and questions are answered. She's taken back to the operating room where general anesthesia was initiated. She's placed in lithotomy and appropriately padded. The anal canal was prepped and draped in a sterile fashion and timeout was done. Digital examination was performed. She had no external hemorrhoidal tissue. She had no evidence of hemorrhoid prolapse. Upon digital examination, I felt a large anterior rectal defect. Upon placing 2 fingers into the introitus of the vagina, a large rectocele was identified. Anoscopy showed grade 2 left lateral and right posterior column hemorrhoid disease which was  minimal. Both areas showed some stigmata of recent bleeding. I oversewed these areas with 3-0 Monocryl. He was not enough tissue for hemorrhoidectomy. Given the large rectocele, I felt that this would need to be addressed with urology. The remainder examination was normal. Exparel was used for local anesthesia an anal block. 20 cc was mixed with 20 cc of saline and a total of 20 cc of this volume was infiltrated into the anal canal. Dry dressings were applied. Of note, her sphincter fell intact. All final counts were correct. The patient was taken out of lithotomy, extubated and taken to recovery in satisfactory condition.

## 2013-08-10 NOTE — Discharge Instructions (Signed)
[facility] _______[facility] ° °RECTAL SURGERY POST OP INSTRUCTIONS: POST OP INSTRUCTIONS ° °Always review your discharge instruction sheet given to you by the facility where your surgery was performed. °IF YOU HAVE DISABILITY OR FAMILY LEAVE FORMS, YOU MUST BRING THEM TO THE OFFICE FOR PROCESSING.   °DO NOT GIVE THEM TO YOUR DOCTOR. ° °1. A  prescription for pain medication may be given to you upon discharge.  Take your pain medication as prescribed, if needed.  If narcotic pain medicine is not needed, then you may take acetaminophen (Tylenol) or ibuprofen (Advil) as needed. °2. Take your usually prescribed medications unless otherwise directed. °3. If you need a refill on your pain medication, please contact your pharmacy.  They will contact our office to request authorization. Prescriptions will not be filled after 5 pm or on week-ends. °4. You should follow a light diet the first 48 hours after arrival home, such as soup and crackers, etc.  Be sure to include lots of fluids daily.  Resume your normal diet 2-3 days after surgery.. °5. Most patients will experience some swelling and discomfort in the rectal area. Ice packs, reclining and warm tub soaks will help.  Swelling and discomfort can take several days to resolve.  °6. It is common to experience some constipation if taking pain medication after surgery.  Increasing fluid intake and taking a stool softener (such as Colace) will usually help or prevent this problem from occurring.  A mild laxative (Milk of Magnesia or Miralax) should be taken according to package directions if there are no bowel movements after 48 hours. °7. Unless discharge instructions indicate otherwise, leave your bandage dry and in place for 24 hours, or remove the bandage if you have a bowel movement. You may notice a small amount of bleeding with bowel movements for the first few days. You may have some packing in the rectum which will come out over the first day or two. You  will need to wear an absorbent pad or soft cotton gauze in your underwear until the drainage stops.it. °8. ACTIVITIES:  You may resume regular (light) daily activities beginning the next day--such as daily self-care, walking, climbing stairs--gradually increasing activities as tolerated.  You may have sexual intercourse when it is comfortable.  Refrain from any heavy lifting or straining until approved by your doctor. °a. You may drive when you are no longer taking prescription pain medication, you can comfortably wear a seatbelt, and you can safely maneuver your car and apply brakes. °b. RETURN TO WORK: : ____________________ °c.  °9. You should see your doctor in the office for a follow-up appointment approximately 2-3 weeks after your surgery.  Make sure that you call for this appointment within a day or two after you arrive home to insure a convenient appointment time. °10. OTHER INSTRUCTIONS:  __________________________________________________________________________________________________________________________________________________________________________________________  °WHEN TO CALL YOUR DOCTOR: °1. Fever over 101.0 °2. Inability to urinate °3. Nausea and/or vomiting °4. Extreme swelling or bruising °5. Continued bleeding from rectum. °6. Increased pain, redness, or drainage from the incision °7. Constipation ° °The clinic staff is available to answer your questions during regular business hours.  Please don’t hesitate to call and ask to speak to one of the nurses for clinical concerns.  If you have a medical emergency, go to the nearest emergency room or call 911.  A surgeon from [facility] is always on call at the hospital ° ° °[address], [address], [place], [place]  [address] ? °   [address] [address], [place], [place]   [address] °[phone] ? [phone] ? FAX [phone] °Web site: [email or web address] ° ° °Post Anesthesia Home Care Instructions ° °Activity: °Get  plenty of rest for the remainder of the day. A responsible adult should stay with you for 24 hours following the procedure.  °For the next 24 hours, DO NOT: °-Drive a car °-Operate machinery °-Drink alcoholic beverages °-Take any medication unless instructed by your physician °-Make any legal decisions or sign important papers. ° °Meals: °Start with liquid foods such as gelatin or soup. Progress to regular foods as tolerated. Avoid greasy, spicy, heavy foods. If nausea and/or vomiting occur, drink only clear liquids until the nausea and/or vomiting subsides. Call your physician if vomiting continues. ° °Special Instructions/Symptoms: °Your throat may feel dry or sore from the anesthesia or the breathing tube placed in your throat during surgery. If this causes discomfort, gargle with warm salt water. The discomfort should disappear within 24 hours. ° °

## 2013-08-10 NOTE — Transfer of Care (Signed)
Immediate Anesthesia Transfer of Care Note  Patient: Jenny Evans  Procedure(s) Performed: Procedure(s) with comments: RECTAL EXAM UNDER ANESTHESIA (N/A) - ligation  Patient Location: PACU  Anesthesia Type:General  Level of Consciousness: awake, alert  and oriented  Airway & Oxygen Therapy: Patient Spontanous Breathing and Patient connected to face mask oxygen  Post-op Assessment: Report given to PACU RN and Post -op Vital signs reviewed and stable  Post vital signs: Reviewed and stable  Complications: No apparent anesthesia complications

## 2013-08-10 NOTE — Anesthesia Preprocedure Evaluation (Signed)
Anesthesia Evaluation  Patient identified by MRN, date of birth, ID band Patient awake    Reviewed: Allergy & Precautions, H&P , NPO status , Patient's Chart, lab work & pertinent test results  Airway Mallampati: I TM Distance: >3 FB Neck ROM: Full    Dental   Pulmonary Current Smoker,          Cardiovascular     Neuro/Psych  Headaches,    GI/Hepatic GERD-  Medicated and Controlled,  Endo/Other    Renal/GU      Musculoskeletal   Abdominal   Peds  Hematology   Anesthesia Other Findings   Reproductive/Obstetrics                           Anesthesia Physical Anesthesia Plan  ASA: II  Anesthesia Plan: General   Post-op Pain Management:    Induction: Intravenous  Airway Management Planned: LMA  Additional Equipment:   Intra-op Plan:   Post-operative Plan: Extubation in OR  Informed Consent: I have reviewed the patients History and Physical, chart, labs and discussed the procedure including the risks, benefits and alternatives for the proposed anesthesia with the patient or authorized representative who has indicated his/her understanding and acceptance.     Plan Discussed with: CRNA and Surgeon  Anesthesia Plan Comments:         Anesthesia Quick Evaluation

## 2013-08-10 NOTE — Interval H&P Note (Signed)
History and Physical Interval Note:  08/10/2013 11:31 AM  Jenny Evans  has presented today for surgery, with the diagnosis of anal/rectal pain  The various methods of treatment have been discussed with the patient and family. After consideration of risks, benefits and other options for treatment, the patient has consented to  Procedure(s): RECTAL EXAM UNDER ANESTHESIA (N/A) as a surgical intervention .  The patient's history has been reviewed, patient examined, no change in status, stable for surgery.  I have reviewed the patient's chart and labs.  Questions were answered to the patient's satisfaction.     Neiman Roots A.

## 2013-08-10 NOTE — Anesthesia Postprocedure Evaluation (Signed)
Anesthesia Post Note  Patient: Jenny Evans  Procedure(s) Performed: Procedure(s) (LRB): RECTAL EXAM UNDER ANESTHESIA (N/A)  Anesthesia type: general  Patient location: PACU  Post pain: Pain level controlled  Post assessment: Patient's Cardiovascular Status Stable  Last Vitals:  Filed Vitals:   08/10/13 1357  BP: 111/73  Pulse: 86  Temp: 36.5 C  Resp: 16    Post vital signs: Reviewed and stable  Level of consciousness: sedated  Complications: No apparent anesthesia complications

## 2013-08-11 ENCOUNTER — Encounter (HOSPITAL_BASED_OUTPATIENT_CLINIC_OR_DEPARTMENT_OTHER): Payer: Self-pay | Admitting: Surgery

## 2013-08-17 ENCOUNTER — Telehealth (INDEPENDENT_AMBULATORY_CARE_PROVIDER_SITE_OTHER): Payer: Self-pay | Admitting: *Deleted

## 2013-08-17 NOTE — Telephone Encounter (Signed)
Patient is scheduled with Dr. Sherron MondayMacDiarmid at [facility] on 08/25/13 @ 1:00pm.

## 2013-08-26 ENCOUNTER — Encounter (INDEPENDENT_AMBULATORY_CARE_PROVIDER_SITE_OTHER): Payer: Self-pay

## 2013-08-26 ENCOUNTER — Ambulatory Visit (INDEPENDENT_AMBULATORY_CARE_PROVIDER_SITE_OTHER): Payer: PRIVATE HEALTH INSURANCE | Admitting: Surgery

## 2013-08-26 ENCOUNTER — Encounter (INDEPENDENT_AMBULATORY_CARE_PROVIDER_SITE_OTHER): Payer: Self-pay | Admitting: Surgery

## 2013-08-26 VITALS — BP 110/70 | HR 70 | Temp 98.0°F | Resp 14 | Ht 64.0 in | Wt 128.0 lb

## 2013-08-26 DIAGNOSIS — Z9889 Other specified postprocedural states: Secondary | ICD-10-CM

## 2013-08-26 DIAGNOSIS — N816 Rectocele: Secondary | ICD-10-CM

## 2013-08-26 NOTE — Patient Instructions (Signed)
Rectocele/Enterocele, Care After A woman's birth canal (vagina) can become weak or stretched. This can be caused by childbirth, heavy lifting, lasting (chronic) constipation, aging, or pelvic surgery. When the vagina is weak and stretched, parts of the intestine can bulge into the vagina by pushing against the vaginal walls. A rectocele is when the very end of the large intestine (rectum) causes the bulge. An enterocele is when the small intestine causes the bulge. Surgery to fix this problem is usually done through the vagina. If you just had this surgery, you were probably given a drug to make you sleep (general anesthetic) or a drug that numbs you from the waist down (spinal/epidural). Here is what happened:  The small intestine or rectum was pushed back to its normal place.  The vaginal wall was made stronger. Sometimes this is done with stitches or a mesh-like material. HOME CARE INSTRUCTIONS  Some women go home the same day as their surgery. Others stay in the hospital for a few days. This depends on the size and type of repair.  Pain and Medications  Some pain is normal after this surgery. Only take pain medicine your surgeon prescribed. Follow the directions carefully.  Do not take aspirin. It can cause bleeding.  Do not drink alcohol while taking pain medication.  You may be given a medicine (antibiotic) that kills germs. Follow the directions carefully.  Take warm sitz baths 2 times a day to control discomfort and reduce any swelling. Take sitz baths with your caregiver's permission. Diet  Go back to your normal eating as directed by your caregiver.  Drink a lot of fluids. Drink at least 6 glasses of water every day. Activity  Move around and walk as much as possible. This can keep blood clots from forming in your legs.  Do not climb stairs until your caregiver says it is okay.  Do not lift objects 5 pounds (2.3 kg) or heavier. Do not bend or strain for 6 to 8 weeks.  Do  not drive until after you stop taking pain medicine and your caregiver says it is okay.  Your return to work will depend on the type of work you do. Ask your caregiver what is best for you.  Ask your caregiver when you can resume sexual activity. Most women can start having sex in about 6 weeks after their surgery.  Get plenty of rest during the day and sleep at night.  Have someone help you with your household chores and activities for 3 to 4 weeks. Other Precautions  You may have some discharge from the vagina for a few weeks after the surgery. It may have small amounts of blood in it. This is normal. If you have questions, ask your caregiver.  Do not use tampons or douche.  You should be able to take a shower a day after your surgery. Do not take a tub bath for at least a week.  Take it easy for awhile. You should feel much better in 2 to 3 weeks. It may take up to 6 weeks to feel completely normal.  Keep all follow-up appointments.  Take your temperature twice a day and write it down.  Make sure your family understands everything about your surgery and recovery. SEEK MEDICAL CARE IF:   You have any questions about your medication, or you need stronger pain medication.  Pain continues, even after taking pain medication.  You become constipated.  You have an oral temperature above 102 F (38.9 C).    You develop swelling and redness in the surgery area.  You become dizzy or lightheaded.  You feel sick to your stomach (nauseous), throw up (vomit), or have diarrhea.  You develop a rash.  You have a reaction to your medications. SEEK IMMEDIATE MEDICAL CARE IF:   Pain gets worse.  You have new bleeding from your vagina.  Discharge from the vagina becomes heavy, or it has a bad smell.  You have an oral temperature above 102 F (38.9 C), not controlled by medicine.  You develop belly (abdominal) pain.  You develop chest pain.  You develop shortness of  breath.  You pass out (faint).  You develop pain, swelling, or redness in the leg.  You have pain or burning with urination.  You have bloody urine or cannot urinate. MAKE SURE YOU:   Understand these instructions.  Will watch your condition.  Will get help right away if you are not doing well or get worse. Document Released: 08/27/2009 Document Revised: 03/23/2013 Document Reviewed: 08/27/2009 [employer] Patient Information 2014 [employer].  

## 2013-08-26 NOTE — Progress Notes (Signed)
Patient returns after exam under anesthesia. She had minimal hemorrhoid disease treated with localized ligation. She is seeing the urologist about her rectocele and this was found to be minimal. There is some question she may have interstitial cystitis. Still having some rectal pain.  Exam: Anal canal well healed without seizure, abscess or hemorrhoid tissue noted.  Impression: Rectal pain evaluated by exam under anesthesia with mild internal hemorrhoid disease and rectocele  Plan: Her hemorrhoid disease does not explain the pain she was having. At this point in time I have nothing else to offer her. If she continues to have rectal complaints, she may benefit from a colorectal specialist which I can refer her.

## 2013-09-02 ENCOUNTER — Telehealth (INDEPENDENT_AMBULATORY_CARE_PROVIDER_SITE_OTHER): Payer: Self-pay | Admitting: *Deleted

## 2013-09-02 ENCOUNTER — Encounter (INDEPENDENT_AMBULATORY_CARE_PROVIDER_SITE_OTHER): Payer: Self-pay | Admitting: *Deleted

## 2013-09-02 NOTE — Telephone Encounter (Signed)
Letter completed at this time and placed at the front desk ready for patient pickup.  Message left on patient's voicemail to make her aware.

## 2013-09-02 NOTE — Telephone Encounter (Signed)
Patient called to ask if there is any way to change her return to work note to state light duty next week but she can work 8 hour days.  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  Explained I would check on this then we will let her know.  Patient states understanding and agreeable at this time.

## 2013-09-02 NOTE — Telephone Encounter (Signed)
sure

## 2013-09-06 ENCOUNTER — Telehealth (INDEPENDENT_AMBULATORY_CARE_PROVIDER_SITE_OTHER): Payer: Self-pay | Admitting: *Deleted

## 2013-09-06 NOTE — Telephone Encounter (Signed)
Pt called in regards to being referred to another surgeon?  I read in the notes where it says if she continues to have pain, then she would benefit in seeing a colorectal specialists,  " in which I can refer her", but there is no specific referral that has been given.  Please advise. Thanks! Victorino DikeJennifer

## 2013-09-06 NOTE — Telephone Encounter (Signed)
Can see Jenny Evans

## 2013-09-07 NOTE — Telephone Encounter (Signed)
Appt with Dr. Maisie Fushomas 09-22-13@940  patient aware

## 2013-09-07 NOTE — Telephone Encounter (Signed)
[place]t.[name].jw

## 2013-09-09 ENCOUNTER — Other Ambulatory Visit: Payer: Self-pay | Admitting: Gastroenterology

## 2013-09-09 ENCOUNTER — Ambulatory Visit
Admission: RE | Admit: 2013-09-09 | Discharge: 2013-09-09 | Disposition: A | Discharge status: Home / Self Care | Payer: Managed Care, Other (non HMO) | Source: Ambulatory Visit | Attending: Gastroenterology | Admitting: Gastroenterology

## 2013-09-09 DIAGNOSIS — K5900 Constipation, unspecified: Secondary | ICD-10-CM

## 2013-09-12 ENCOUNTER — Other Ambulatory Visit: Payer: Self-pay | Admitting: Gastroenterology

## 2013-09-12 ENCOUNTER — Ambulatory Visit
Admission: RE | Admit: 2013-09-12 | Discharge: 2013-09-12 | Disposition: A | Discharge status: Home / Self Care | Payer: Managed Care, Other (non HMO) | Source: Ambulatory Visit | Attending: Gastroenterology | Admitting: Gastroenterology

## 2013-09-12 DIAGNOSIS — K5900 Constipation, unspecified: Secondary | ICD-10-CM

## 2013-09-14 ENCOUNTER — Other Ambulatory Visit: Payer: Self-pay | Admitting: Gastroenterology

## 2013-09-14 ENCOUNTER — Ambulatory Visit
Admission: RE | Admit: 2013-09-14 | Discharge: 2013-09-14 | Disposition: A | Discharge status: Home / Self Care | Payer: Managed Care, Other (non HMO) | Source: Ambulatory Visit | Attending: Gastroenterology | Admitting: Gastroenterology

## 2013-09-14 DIAGNOSIS — K5900 Constipation, unspecified: Secondary | ICD-10-CM

## 2013-09-22 ENCOUNTER — Ambulatory Visit (INDEPENDENT_AMBULATORY_CARE_PROVIDER_SITE_OTHER): Payer: PRIVATE HEALTH INSURANCE | Admitting: General Surgery

## 2013-09-22 ENCOUNTER — Encounter (INDEPENDENT_AMBULATORY_CARE_PROVIDER_SITE_OTHER): Payer: Self-pay | Admitting: General Surgery

## 2013-09-22 VITALS — BP 110/80 | HR 86 | Temp 97.7°F | Resp 14 | Ht 64.0 in | Wt 128.2 lb

## 2013-09-22 DIAGNOSIS — K6289 Other specified diseases of anus and rectum: Secondary | ICD-10-CM

## 2013-09-22 MED ORDER — HYDROCORTISONE ACETATE 25 MG RE SUPP: 25.0000 mg | Freq: Two times a day (BID) | RECTAL | Status: DC

## 2013-09-22 NOTE — Patient Instructions (Signed)
HEMORRHOIDS    Did you know... Hemorrhoids are one of the most common ailments known.  More than half the population will develop hemorrhoids, usually after age 41.  Millions of Americans currently suffer from hemorrhoids.  The average person suffers in silence for a long period before seeking medical care.  Today's treatment methods make some types of hemorrhoid removal much less painful.  What are hemorrhoids? Often described as "varicose veins of the anus and rectum", hemorrhoids are enlarged, bulging blood vessels in and about the anus and lower rectum. There are two types of hemorrhoids: external and internal, which refer to their location.  External (outside) hemorrhoids develop near the anus and are covered by very sensitive skin. These are usually painless. However, if a blood clot (thrombosis) develops in an external hemorrhoid, it becomes a painful, hard lump. The external hemorrhoid may bleed if it ruptures. Internal (inside) hemorrhoids develop within the anus beneath the lining. Painless bleeding and protrusion during bowel movements are the most common symptom. However, an internal hemorrhoid can cause severe pain if it is completely "prolapsed" - protrudes from the anal opening and cannot be pushed back inside.   What causes hemorrhoids? An exact cause is unknown; however, the upright posture of humans alone forces a great deal of pressure on the rectal veins, which sometimes causes them to bulge. Other contributing factors include:  . Aging  . Chronic constipation or diarrhea  . Pregnancy  . Heredity  . Straining during bowel movements  . Faulty bowel function due to overuse of laxatives or enemas . Spending long periods of time (e.g., reading) on the toilet  Whatever the cause, the tissues supporting the vessels stretch. As a result, the vessels dilate; their walls become thin and bleed. If the stretching and pressure continue, the weakened vessels protrude.  What are the  symptoms? If you notice any of the following, you could have hemorrhoids:  . Bleeding during bowel movements  . Protrusion during bowel movements . Itching in the anal area  . Pain  . Sensitive lump(s)  How are hemorrhoids treated? Mild symptoms can be relieved frequently by increasing the amount of fiber (e.g., fruits, vegetables, breads and cereals) and fluids in the diet. Eliminating excessive straining reduces the pressure on hemorrhoids and helps prevent them from protruding. A sitz bath - sitting in plain warm water for about 10 minutes - can also provide some relief . With these measures, the pain and swelling of most symptomatic hemorrhoids will decrease in two to seven days, and the firm lump should recede within four to six weeks. In cases of severe or persistent pain from a thrombosed hemorrhoid, your physician may elect to remove the hemorrhoid containing the clot with a small incision. Performed under local anesthesia as an outpatient, this procedure generally provides relief. Severe hemorrhoids may require special treatment, much of which can be performed on an outpatient basis.  . Ligation - the rubber band treatment - works effectively on internal hemorrhoids that protrude with bowel movements. A small rubber band is placed over the hemorrhoid, cutting off its blood supply. The hemorrhoid and the band fall off in a few days and the wound usually heals in a week or two. This procedure sometimes produces mild discomfort and bleeding and may need to be repeated for a full effect.  There is a more intense version of this procedure that is done in the OR as outpatient surgery called THD.  It involves identifying blood vessels leading to the   hemorrhoids and then tying them off with sutures.  This method is a little more painful than rubber band ligation but less painful than traditional hemorrhoidectomy and usually does not have to be repeated.  It is best for internal hemorrhoids that  bleed.  Rubber Band Ligation of Internal Hemorrhoids:  A.  Bulging, bleeding, internal hemorrhoid B.  Rubber band applied at the base of the hemorrhoid C.  About 7 days later, the banded hemorrhoid has fallen off leaving a small scar (arrow)  . Injection and Coagulation can also be used on bleeding hemorrhoids that do not protrude. Both methods are relatively painless and cause the hemorrhoid to shrivel up. . Hemorrhoidectomy - surgery to remove the hemorrhoids - is the most complete method for removal of internal and external hemorrhoids. It is necessary when (1) clots repeatedly form in external hemorrhoids; (2) ligation fails to treat internal hemorrhoids; (3) the protruding hemorrhoid cannot be reduced; or (4) there is persistent bleeding. A hemorrhoidectomy removes excessive tissue that causes the bleeding and protrusion. It is done under anesthesia using sutures, and may, depending upon circumstances, require hospitalization and a period of inactivity. Laser hemorrhoidectomies do not offer any advantage over standard operative techniques. They are also quite expensive, and contrary to popular belief, are no less painful.  Do hemorrhoids lead to cancer? No. There is no relationship between hemorrhoids and cancer. However, the symptoms of hemorrhoids, particularly bleeding, are similar to those of colorectal cancer and other diseases of the digestive system. Therefore, it is important that all symptoms are investigated by a physician specially trained in treating diseases of the colon and rectum and that everyone 50 years or older undergo screening tests for colorectal cancer. Do not rely on over-the-counter medications or other self-treatments. See a colorectal surgeon first so your symptoms can be properly evaluated and effective treatment prescribed.  2012 American Society of Colon & Rectal Surgeons    Fiber Chart  You should 25-30g of fiber per day and drinking 8 glasses of water to help  your bowels move regularly.  In the chart below you can look up how much fiber you are getting in an average day.  If you are not getting enough fiber, you should add a fiber supplement to your diet.  Examples of this include Metamucil, FiberCon and Citrucel.  These can be purchased at your local grocery store or pharmacy.      http://www.[employer].edu/offices/health/nutritioncoach/AtoZ/handouts/Fiber.pdf    GETTING TO GOOD BOWEL HEALTH. Irregular bowel habits such as constipation can lead to many problems over time.  Having one soft bowel movement a day is the most important way to prevent further problems.  The anorectal canal is designed to handle stretching and feces to safely manage our ability to get rid of solid waste (feces, poop, stool) out of our body.  BUT, hard constipated stools can act like ripping concrete bricks causing inflamed hemorrhoids, anal fissures, abdominal pain and bloating.     The goal: ONE SOFT BOWEL MOVEMENT A DAY!  To have soft, regular bowel movements:    Drink at least 8 tall glasses of water a day.     Take plenty of fiber.  Fiber is the undigested part of plant food that passes into the colon, acting s "natures broom" to encourage bowel motility and movement.  Fiber can absorb and hold large amounts of water. This results in a larger, bulkier stool, which is soft and easier to pass. Work gradually over several weeks up to 6 servings a   day of fiber (25g a day even more if needed) in the form of: o Vegetables -- Root (potatoes, carrots, turnips), leafy green (lettuce, salad greens, celery, spinach), or cooked high residue (cabbage, broccoli, etc) o Fruit -- Fresh (unpeeled skin & pulp), Dried (prunes, apricots, cherries, etc ),  or stewed ( applesauce)  o Whole grain breads, pasta, etc (whole wheat)  o Bran cereals    Bulking Agents -- This type of water-retaining fiber generally is easily obtained each day by one of the following:  o Psyllium bran -- The psyllium  plant is remarkable because its ground seeds can retain so much water. This product is available as Metamucil, Konsyl, Effersyllium, Per Diem Fiber, or the less expensive generic preparation in drug and health food stores. Although labeled a laxative, it really is not a laxative.  o Methylcellulose -- This is another fiber derived from wood which also retains water. It is available as Citrucel. o Polyethylene Glycol - and "artificial" fiber commonly called Miralax or Glycolax.  It is helpful for people with gassy or bloated feelings with regular fiber o Flax Seed - a less gassy fiber than psyllium   No reading or other relaxing activity while on the toilet. If bowel movements take longer than 5 minutes, you are too constipated.   AVOID CONSTIPATION.  High fiber and water intake usually takes care of this.  Sometimes a laxative is needed to stimulate more frequent bowel movements, but    Laxatives are not a good long-term solution as it can wear the colon out. o Osmotics (Milk of Magnesia, Fleets phosphosoda, Magnesium citrate, MiraLax, GoLytely) are safer than  o Stimulants (Senokot, Castor Oil, Dulcolax, Ex Lax)    o Do not take laxatives for more than 7days in a row.    IF SEVERELY CONSTIPATED, try a Bowel Retraining Program: o Do not use laxatives.  o Eat a diet high in roughage, such as bran cereals and leafy vegetables.  o Drink six (6) ounces of prune or apricot juice each morning.  o Eat two (2) large servings of stewed fruit each day.  o Take one (1) heaping tablespoon of a psyllium-based bulking agent twice a day. Use sugar-free sweetener when possible to avoid excessive calories.  o Eat a normal breakfast.  o Set aside 15 minutes after breakfast to sit on the toilet, but do not strain to have a bowel movement.  o If you do not have a bowel movement by the third day, use an enema and repeat the above steps.    

## 2013-09-22 NOTE — Progress Notes (Signed)
Chief Complaint  Patient presents with  . New Evaluation    HISTORY: Kathryne GinChiha Z Siers is a 43 y.o. female who presents to the office with anal pain and constipation.  She is recently s/p EUA, which was relatively normal except for a perceived rectocele by the operating surgeon.  This was evaluated by urology and reportedly felt to be nonoperative.  Other symptoms include feeling of pressure.  This had been occurring for several years.  She has tried miralax, enemas, castor oil in the past with minimal success.  Nothing makes the symptoms worse.   It is intermittent in nature.  Her bowel habits are irregular and her bowel movements are usually hard and occur once every 2 weeks. She reports frequent he have any use enemas and manual disimpaction to have bowel movements.   Her fiber intake is dietary.  She has never had a colonoscopy.  She denies any prolapsing tissue.  She is s/p sitz marker study, which showed emptying of most markers within about a week.  She denies any leakage or having to splint her vagina to have a bowel movement.  Past Medical History  Diagnosis Date  . Migraine   . GERD (gastroesophageal reflux disease)   . Ulcer   . Toothache   . Chronic pain   . Bronchitis   . Sickle cell trait   . Asthma       Past Surgical History  Procedure Laterality Date  . Myringotomy      age 43  . Esophagogastroduodenoscopy (egd) with propofol N/A 04/04/2013    Procedure: ESOPHAGOGASTRODUODENOSCOPY (EGD) WITH PROPOFOL;  Surgeon: Willis ModenaWilliam Outlaw, MD;  Location: [facility];  Service: Endoscopy;  Laterality: N/A;  . Examination under anesthesia N/A 08/10/2013    Procedure: RECTAL EXAM UNDER ANESTHESIA;  Surgeon: Clovis Puhomas A. Cornett, MD;  Location: [facility];  Service: General;  Laterality: N/A;  ligation        Current Outpatient Prescriptions  Medication Sig Dispense Refill  . albuterol (PROVENTIL HFA;VENTOLIN HFA) 108 (90 BASE) MCG/ACT inhaler Inhale 2 puffs into the lungs  every 4 (four) hours as needed for wheezing or shortness of breath (cough).  1 Inhaler  0  . esomeprazole (NEXIUM) 40 MG capsule Take 1 capsule (40 mg total) by mouth 2 (two) times daily before a meal.      . fluticasone (FLONASE) 50 MCG/ACT nasal spray Place 1 spray into both nostrils daily.  16 g  0  . hydrocortisone (ANUSOL-HC) 25 MG suppository Place 1 suppository (25 mg total) rectally 2 (two) times daily. For 7 days  24 suppository  5  . ibuprofen (ADVIL,MOTRIN) 200 MG tablet Take 400 mg by mouth every 6 (six) hours as needed (pain).      . prochlorperazine (COMPAZINE) 10 MG tablet Take 1 tablet (10 mg total) by mouth 2 (two) times daily as needed for nausea or vomiting (Nausea ).  10 tablet  0  . pseudoephedrine (SUDAFED) 30 MG tablet Take 1 tablet (30 mg total) by mouth every 6 (six) hours as needed for congestion.  30 tablet  0  . SUMAtriptan (IMITREX) 6 MG/0.5ML SOLN injection Inject 6 mg into the skin every 2 (two) hours as needed for migraine or headache. May repeat in 2 hours if headache persists or recurs.       No current facility-administered medications for this visit.      Allergies  Allergen Reactions  . Hydrocodone-Acetaminophen Nausea And Vomiting      Family History  Problem Relation Age of Onset  . Heart disease Mother   . Kidney disease Mother   . Hypertension Mother   . COPD Mother   . Diabetes Mother   . Heart disease Father     History   Social History  . Marital Status: Single    Spouse Name: N/A    Number of Children: N/A  . Years of Education: N/A   Social History Main Topics  . Smoking status: Current Every Day Smoker -- 0.50 packs/day for 15 years    Types: Cigarettes  . Smokeless tobacco: Never Used  . Alcohol Use: Yes     Comment: overy rare  . Drug Use: No  . Sexual Activity: Yes    Birth Control/ Protection: None   Other Topics Concern  . None   Social History Narrative  . None      REVIEW OF SYSTEMS - PERTINENT POSITIVES  ONLY: Review of Systems - General ROS: negative for - chills, fever or weight loss Hematological and Lymphatic ROS: negative for - bleeding problems, blood clots or bruising Respiratory ROS: no cough, shortness of breath, or wheezing Cardiovascular ROS: no chest pain or dyspnea on exertion Gastrointestinal ROS: no abdominal pain, change in bowel habits, or black or bloody stools Genito-Urinary ROS: no dysuria, trouble voiding, or hematuria  EXAM: Filed Vitals:   09/22/13 1005  BP: 110/80  Pulse: 86  Temp: 97.7 F (36.5 C)  Resp: 14    General appearance: alert and cooperative Resp: clear to auscultation bilaterally Cardio: regular rate and rhythm GI: normal findings: soft, non-tender   Procedure: Anoscopy Surgeon: Maisie Fus Diagnosis: anal pain  Assistant: Milas Hock After the risks and benefits were explained, verbal consent was obtained for above procedure  Anesthesia: none Findings: no external skin tags, no sphincter hypertension, some redundant mucosa palpated, grade 1 internal hemorrhoids.    ASSESSMENT AND PLAN: ROSEANN KEES Is a 43 year old female with anal pain. She describes symptoms consistent with anal sphincter spasms.  This may be related to her anal canal inflammation noted internally. I recommended that she try a suppository twice a day for 7 days to help reduce his inflammation. Ultimately she needs to get her constipation under better control to get these symptoms to go away completely.  I have given her information on how to control this better using fiber supplementation. I will see her back on as needed basis.  If she continue to have trouble with constipation on a high fiber regimen, I will ask Dr Dulce Sellar to consider colonoscopy.      Vanita Panda, MD Colon and Rectal Surgery / General Surgery [facility]      Visit Diagnoses: 1. Anal pain     Primary Care Physician: Cain Saupe, MD

## 2013-10-08 ENCOUNTER — Encounter (HOSPITAL_COMMUNITY): Payer: Self-pay | Admitting: Emergency Medicine

## 2013-10-08 ENCOUNTER — Emergency Department (HOSPITAL_COMMUNITY)
Admission: EM | Admit: 2013-10-08 | Discharge: 2013-10-08 | Disposition: A | Discharge status: Home / Self Care | Payer: Managed Care, Other (non HMO) | Attending: Emergency Medicine | Admitting: Emergency Medicine

## 2013-10-08 DIAGNOSIS — L0291 Cutaneous abscess, unspecified: Secondary | ICD-10-CM

## 2013-10-08 DIAGNOSIS — IMO0002 Reserved for concepts with insufficient information to code with codable children: Secondary | ICD-10-CM | POA: Insufficient documentation

## 2013-10-08 DIAGNOSIS — G8929 Other chronic pain: Secondary | ICD-10-CM | POA: Insufficient documentation

## 2013-10-08 DIAGNOSIS — J45909 Unspecified asthma, uncomplicated: Secondary | ICD-10-CM | POA: Insufficient documentation

## 2013-10-08 DIAGNOSIS — F17200 Nicotine dependence, unspecified, uncomplicated: Secondary | ICD-10-CM | POA: Insufficient documentation

## 2013-10-08 DIAGNOSIS — K219 Gastro-esophageal reflux disease without esophagitis: Secondary | ICD-10-CM | POA: Insufficient documentation

## 2013-10-08 DIAGNOSIS — L0201 Cutaneous abscess of face: Secondary | ICD-10-CM | POA: Insufficient documentation

## 2013-10-08 DIAGNOSIS — Z79899 Other long term (current) drug therapy: Secondary | ICD-10-CM | POA: Insufficient documentation

## 2013-10-08 DIAGNOSIS — L03211 Cellulitis of face: Principal | ICD-10-CM

## 2013-10-08 DIAGNOSIS — Z862 Personal history of diseases of the blood and blood-forming organs and certain disorders involving the immune mechanism: Secondary | ICD-10-CM | POA: Insufficient documentation

## 2013-10-08 DIAGNOSIS — G43909 Migraine, unspecified, not intractable, without status migrainosus: Secondary | ICD-10-CM | POA: Insufficient documentation

## 2013-10-08 NOTE — ED Provider Notes (Signed)
Medical screening examination/treatment/procedure(s) were performed by non-physician practitioner and as supervising physician I was immediately available for consultation/collaboration.   EKG Interpretation None      Bernise Sylvain, MD, FACEP   Josanna Hefel L Shatarra Wehling, MD 10/08/13 2049 

## 2013-10-08 NOTE — ED Notes (Signed)
Pt states noted cyst to R side of face a few days ago, tried to drain it yesterday and was unable to. Pt reports throbbing pain.

## 2013-10-08 NOTE — Discharge Instructions (Signed)
Abscess Care After An abscess (also called a boil or furuncle) is an infected area that contains a collection of pus. Signs and symptoms of an abscess include pain, tenderness, redness, or hardness, or you may feel a moveable soft area under your skin. An abscess can occur anywhere in the body. The infection may spread to surrounding tissues causing cellulitis. A cut (incision) by the surgeon was made over your abscess and the pus was drained out. Gauze may have been packed into the space to provide a drain that will allow the cavity to heal from the inside outwards. The boil may be painful for 5 to 7 days. Most people with a boil do not have high fevers. Your abscess, if seen early, may not have localized, and may not have been lanced. If not, another appointment may be required for this if it does not get better on its own or with medications. HOME CARE INSTRUCTIONS   Only take over-the-counter or prescription medicines for pain, discomfort, or fever as directed by your caregiver.  When you bathe, soak and then remove gauze or iodoform packs at least daily or as directed by your caregiver. You may then wash the wound gently with mild soapy water. Repack with gauze or do as your caregiver directs. SEEK IMMEDIATE MEDICAL CARE IF:   You develop increased pain, swelling, redness, drainage, or bleeding in the wound site.  You develop signs of generalized infection including muscle aches, chills, fever, or a general ill feeling.  An oral temperature above 102 F (38.9 C) develops, not controlled by medication. See your caregiver for a recheck if you develop any of the symptoms described above. If medications (antibiotics) were prescribed, take them as directed. Document Released: 12/19/2004 Document Revised: 08/25/2011 Document Reviewed: 08/16/2007 [facility] Patient Information 2014 [employer], [place]. Please apply warm compress to your cheek.  Several times tonight, you should not need any further  treatment

## 2013-10-08 NOTE — ED Provider Notes (Signed)
CSN: [id number]     Arrival date & time 10/08/13  1928 History  This chart was scribed for non-physician practitioner working Earley FavorGail Manmeet Arzola, NP with Ward GivensIva L Knapp, MD by [name], ED Scribe. This patient was seen in room WTR8/WTR8 and the patient's care was started at 8:10 PM.   Chief Complaint  Patient presents with  . Abscess      The history is provided by the patient. No language interpreter was used.   HPI Comments: Jenny Evans is a 43 y.o. female who presents to the Emergency Department complaining of a cyst on the right side of her face, onset two days ago. Patient reports treating cyst with hot compresses a few times a day. Patient attempted to drain the cyst yesterday. Patient now reports the area is sore and painful to touch.    Past Medical History  Diagnosis Date  . Migraine   . GERD (gastroesophageal reflux disease)   . Ulcer   . Toothache   . Chronic pain   . Bronchitis   . Sickle cell trait   . Asthma    Past Surgical History  Procedure Laterality Date  . Myringotomy      age 43  . Esophagogastroduodenoscopy (egd) with propofol N/A 04/04/2013    Procedure: ESOPHAGOGASTRODUODENOSCOPY (EGD) WITH PROPOFOL;  Surgeon: Willis ModenaWilliam Outlaw, MD;  Location: [facility];  Service: Endoscopy;  Laterality: N/A;  . Examination under anesthesia N/A 08/10/2013    Procedure: RECTAL EXAM UNDER ANESTHESIA;  Surgeon: Clovis Puhomas A. Cornett, MD;  Location: [facility];  Service: General;  Laterality: N/A;  ligation   Family History  Problem Relation Age of Onset  . Heart disease Mother   . Kidney disease Mother   . Hypertension Mother   . COPD Mother   . Diabetes Mother   . Heart disease Father    History  Substance Use Topics  . Smoking status: Current Every Day Smoker -- 0.50 packs/day for 15 years    Types: Cigarettes  . Smokeless tobacco: Never Used  . Alcohol Use: Yes     Comment: very rare   OB History   Grav Para Term Preterm Abortions TAB SAB Ect Mult  Living                 Review of Systems  Skin: Positive for wound.       Abscess  All other systems reviewed and are negative.     Allergies  Hydrocodone-acetaminophen  Home Medications   Prior to Admission medications   Medication Sig Start Date End Date Taking? Authorizing Provider  albuterol (PROVENTIL HFA;VENTOLIN HFA) 108 (90 BASE) MCG/ACT inhaler Inhale 2 puffs into the lungs every 4 (four) hours as needed for wheezing or shortness of breath (cough). 07/08/12   Olivia Mackielga M Otter, MD  esomeprazole (NEXIUM) 40 MG capsule Take 1 capsule (40 mg total) by mouth 2 (two) times daily before a meal. 04/04/13   Willis ModenaWilliam Outlaw, MD  fluticasone (FLONASE) 50 MCG/ACT nasal spray Place 1 spray into both nostrils daily. 07/04/13   Roxy Horsemanobert Browning, PA-C  hydrocortisone (ANUSOL-HC) 25 MG suppository Place 1 suppository (25 mg total) rectally 2 (two) times daily. For 7 days 09/22/13   Romie LeveeAlicia Thomas, MD  ibuprofen (ADVIL,MOTRIN) 200 MG tablet Take 400 mg by mouth every 6 (six) hours as needed (pain).    Historical Provider, MD  prochlorperazine (COMPAZINE) 10 MG tablet Take 1 tablet (10 mg total) by mouth 2 (two) times daily as needed for nausea  or vomiting (Nausea ). 07/13/13   Mora BellmanHannah S Merrell, PA-C  pseudoephedrine (SUDAFED) 30 MG tablet Take 1 tablet (30 mg total) by mouth every 6 (six) hours as needed for congestion. 07/04/13   Roxy Horsemanobert Browning, PA-C  SUMAtriptan (IMITREX) 6 MG/0.5ML SOLN injection Inject 6 mg into the skin every 2 (two) hours as needed for migraine or headache. May repeat in 2 hours if headache persists or recurs.    Historical Provider, MD   Triage Vitals: BP 103/69  Pulse 94  Temp(Src) 98.5 F (36.9 C)  Resp 16  Ht 5\' 4"  (1.626 m)  Wt 129 lb (58.514 kg)  BMI 22.13 kg/m2  SpO2 100%  LMP 09/14/2013 Physical Exam  Nursing note and vitals reviewed. Constitutional: She is oriented to person, place, and time. She appears well-developed and well-nourished. No distress.  HENT:   Head: Normocephalic and atraumatic.  [name].5 cm round cystic lesion on right upper cheek  Eyes: EOM are normal. Pupils are equal, round, and reactive to light.  Neck: Neck supple. No tracheal deviation present.  Cardiovascular: Normal rate.   Pulmonary/Chest: Effort normal. No respiratory distress.  Musculoskeletal: Normal range of motion.  Neurological: She is alert and oriented to person, place, and time.  Skin: Skin is warm and dry. There is erythema.  Psychiatric: She has a normal mood and affect. Her behavior is normal.    ED Course  Procedures (including critical care time) DIAGNOSTIC STUDIES: Oxygen Saturation is 100% on room air, normal by my interpretation.    COORDINATION OF CARE: 8:15 PM- Discussed treatment plan with patient at bedside and patient agreed to plan.   INCISION AND DRAINAGE PROCEDURE NOTE: Patient identification was confirmed and verbal consent was obtained. This procedure was performed by [name], NP at 8:27 PM. Site: right cheek Sterile procedures observed  Needle size: injected with 23 G and opened with 18 G Anesthetic used (type and amt): .25 with lidocaine with epinephrine  Blade size18 G needle Drainage: moderate amount with purulent core was expressed Complexity: Complex Packing used Site anesthetized, incision made over site, wound drained and explored loculations, rinsed with copious amounts of normal saline, wound packed with sterile gauze, covered with dry, sterile dressing.  Pt tolerated procedure well without complications.  Instructions for care discussed verbally and pt provided with additional written instructions for homecare and f/u.   Labs Review Labs Reviewed - No data to display  Imaging Review No results found.   EKG Interpretation None      MDM  Patient with cystic lesion on the right cheek I&D performed large for cough when palpated, need any further treatment or antibiotics.  She's been encouraged to apply warm  soaks or compresses.  Several times tonight Final diagnoses:  Abscess   I personally performed the services described in this documentation, which was scribed in my presence. The recorded information has been reviewed and is accurate.  Arman FilterGail K Reign Bartnick, NP 10/08/13 2044

## 2013-10-11 ENCOUNTER — Emergency Department (HOSPITAL_COMMUNITY)
Admission: EM | Admit: 2013-10-11 | Discharge: 2013-10-11 | Disposition: A | Discharge status: Home / Self Care | Payer: Managed Care, Other (non HMO) | Attending: Emergency Medicine | Admitting: Emergency Medicine

## 2013-10-11 ENCOUNTER — Encounter (HOSPITAL_COMMUNITY): Payer: Self-pay | Admitting: Emergency Medicine

## 2013-10-11 DIAGNOSIS — Z79899 Other long term (current) drug therapy: Secondary | ICD-10-CM | POA: Insufficient documentation

## 2013-10-11 DIAGNOSIS — IMO0002 Reserved for concepts with insufficient information to code with codable children: Secondary | ICD-10-CM | POA: Insufficient documentation

## 2013-10-11 DIAGNOSIS — F17200 Nicotine dependence, unspecified, uncomplicated: Secondary | ICD-10-CM | POA: Insufficient documentation

## 2013-10-11 DIAGNOSIS — Z872 Personal history of diseases of the skin and subcutaneous tissue: Secondary | ICD-10-CM | POA: Insufficient documentation

## 2013-10-11 DIAGNOSIS — J45909 Unspecified asthma, uncomplicated: Secondary | ICD-10-CM | POA: Insufficient documentation

## 2013-10-11 DIAGNOSIS — Z862 Personal history of diseases of the blood and blood-forming organs and certain disorders involving the immune mechanism: Secondary | ICD-10-CM | POA: Insufficient documentation

## 2013-10-11 DIAGNOSIS — G43909 Migraine, unspecified, not intractable, without status migrainosus: Secondary | ICD-10-CM | POA: Insufficient documentation

## 2013-10-11 DIAGNOSIS — K279 Peptic ulcer, site unspecified, unspecified as acute or chronic, without hemorrhage or perforation: Secondary | ICD-10-CM

## 2013-10-11 DIAGNOSIS — G8929 Other chronic pain: Secondary | ICD-10-CM | POA: Insufficient documentation

## 2013-10-11 DIAGNOSIS — K219 Gastro-esophageal reflux disease without esophagitis: Secondary | ICD-10-CM | POA: Insufficient documentation

## 2013-10-11 LAB — URINALYSIS, ROUTINE W REFLEX MICROSCOPIC
BILIRUBIN URINE: NEGATIVE
Glucose, UA: NEGATIVE mg/dL
KETONES UR: NEGATIVE mg/dL
Leukocytes, UA: NEGATIVE
Nitrite: NEGATIVE
PH: 5.5 (ref 5.0–8.0)
Protein, ur: NEGATIVE mg/dL
Specific Gravity, Urine: 1.019 (ref 1.005–1.030)
Urobilinogen, UA: 1 mg/dL (ref 0.0–1.0)

## 2013-10-11 LAB — CBC WITH DIFFERENTIAL/PLATELET
BASOS ABS: 0 10*3/uL (ref 0.0–0.1)
Basophils Relative: 0 % (ref 0–1)
EOS PCT: 1 % (ref 0–5)
Eosinophils Absolute: 0.1 10*3/uL (ref 0.0–0.7)
HCT: 33.6 % — ABNORMAL LOW (ref 36.0–46.0)
Hemoglobin: 12.1 g/dL (ref 12.0–15.0)
Lymphocytes Relative: 38 % (ref 12–46)
Lymphs Abs: 2.8 10*3/uL (ref 0.7–4.0)
MCH: 30.9 pg (ref 26.0–34.0)
MCHC: 36 g/dL (ref 30.0–36.0)
MCV: 85.9 fL (ref 78.0–100.0)
Monocytes Absolute: 0.7 10*3/uL (ref 0.1–1.0)
Monocytes Relative: 9 % (ref 3–12)
Neutro Abs: 3.9 10*3/uL (ref 1.7–7.7)
Neutrophils Relative %: 52 % (ref 43–77)
PLATELETS: 391 10*3/uL (ref 150–400)
RBC: 3.91 MIL/uL (ref 3.87–5.11)
RDW: 14.2 % (ref 11.5–15.5)
WBC: 7.6 10*3/uL (ref 4.0–10.5)

## 2013-10-11 LAB — COMPREHENSIVE METABOLIC PANEL
ALK PHOS: 66 U/L (ref 39–117)
ALT: 12 U/L (ref 0–35)
AST: 20 U/L (ref 0–37)
Albumin: 4 g/dL (ref 3.5–5.2)
BUN: 9 mg/dL (ref 6–23)
CALCIUM: 9.3 mg/dL (ref 8.4–10.5)
CO2: 23 meq/L (ref 19–32)
Chloride: 101 mEq/L (ref 96–112)
Creatinine, Ser: 0.7 mg/dL (ref 0.50–1.10)
GFR calc Af Amer: >90 mL/min (ref >90)
GFR calc non Af Amer: >90 mL/min (ref >90)
GLUCOSE: 96 mg/dL (ref 70–99)
POTASSIUM: 3.6 meq/L — AB (ref 3.7–5.3)
Sodium: 137 mEq/L (ref 137–147)
TOTAL PROTEIN: 7.3 g/dL (ref 6.0–8.3)
Total Bilirubin: <0.2 mg/dL — ABNORMAL LOW (ref 0.3–1.2)

## 2013-10-11 LAB — URINE MICROSCOPIC-ADD ON

## 2013-10-11 MED ORDER — PANTOPRAZOLE SODIUM 40 MG PO TBEC: 40.0000 mg | DELAYED_RELEASE_TABLET | Freq: Every day | ORAL | Status: DC

## 2013-10-11 MED ORDER — OXYCODONE-ACETAMINOPHEN 5-325 MG PO TABS
1.0000 | ORAL_TABLET | Freq: Once | ORAL | Status: AC
  Administered 2013-10-11: 1 via ORAL
  Filled 2013-10-11: qty 1

## 2013-10-11 MED ORDER — PROMETHAZINE HCL 25 MG PO TABS
25.0000 mg | ORAL_TABLET | Freq: Once | ORAL | Status: AC
  Administered 2013-10-11: 25 mg via ORAL
  Filled 2013-10-11: qty 1

## 2013-10-11 MED ORDER — PANTOPRAZOLE SODIUM 40 MG PO TBEC: 80.0000 mg | DELAYED_RELEASE_TABLET | Freq: Every day | ORAL | Status: DC

## 2013-10-11 MED ORDER — PROMETHAZINE HCL 25 MG PO TABS: 25.0000 mg | ORAL_TABLET | Freq: Once | ORAL | Status: DC

## 2013-10-11 MED ORDER — OXYCODONE-ACETAMINOPHEN 5-325 MG PO TABS
1.0000 | ORAL_TABLET | Freq: Once | ORAL | Status: DC
Start: 2013-10-11 — End: 2013-11-10

## 2013-10-11 NOTE — ED Provider Notes (Signed)
CSN: [id number]     Arrival date & time 10/11/13  1628 History   First MD Initiated Contact with Patient 10/11/13 1819     Chief Complaint  Patient presents with  . Abdominal Pain  . Hematemesis      HPI Pt reports having an ulcer and n/v since yesterday, reports vomiting blood specks and black stools.  Similar to her previous episode of peptic ulcer disease.  Patient does not want IV medication and wants to try pills.  Patient denies any syncope or presyncope or dizziness.  Patient has minimal pain right now. Past Medical History  Diagnosis Date  . Migraine   . GERD (gastroesophageal reflux disease)   . Ulcer   . Toothache   . Chronic pain   . Bronchitis   . Sickle cell trait   . Asthma    Past Surgical History  Procedure Laterality Date  . Myringotomy      age 43  . Esophagogastroduodenoscopy (egd) with propofol N/A 04/04/2013    Procedure: ESOPHAGOGASTRODUODENOSCOPY (EGD) WITH PROPOFOL;  Surgeon: Willis ModenaWilliam Outlaw, MD;  Location: [facility];  Service: Endoscopy;  Laterality: N/A;  . Examination under anesthesia N/A 08/10/2013    Procedure: RECTAL EXAM UNDER ANESTHESIA;  Surgeon: Clovis Puhomas A. Cornett, MD;  Location: [facility];  Service: General;  Laterality: N/A;  ligation   Family History  Problem Relation Age of Onset  . Heart disease Mother   . Kidney disease Mother   . Hypertension Mother   . COPD Mother   . Diabetes Mother   . Heart disease Father    History  Substance Use Topics  . Smoking status: Current Every Day Smoker -- 0.50 packs/day for 15 years    Types: Cigarettes  . Smokeless tobacco: Never Used  . Alcohol Use: Yes     Comment: very rare   OB History   Grav Para Term Preterm Abortions TAB SAB Ect Mult Living                 Review of Systems  All other systems reviewed and are negative   Allergies  Hydrocodone-acetaminophen  Home Medications   Prior to Admission medications   Medication Sig Start Date End Date Taking?  Authorizing Provider  albuterol (PROVENTIL HFA;VENTOLIN HFA) 108 (90 BASE) MCG/ACT inhaler Inhale 2 puffs into the lungs every 4 (four) hours as needed for wheezing or shortness of breath (cough). 07/08/12  Yes Olivia Mackielga M Otter, MD  esomeprazole (NEXIUM) 40 MG capsule Take 1 capsule (40 mg total) by mouth 2 (two) times daily before a meal. 04/04/13  Yes Willis ModenaWilliam Outlaw, MD  hydrocortisone (ANUSOL-HC) 25 MG suppository Place 25 mg rectally every other day.   Yes Historical Provider, MD  SUMAtriptan (IMITREX) 6 MG/0.5ML SOLN injection Inject 6 mg into the skin every 2 (two) hours as needed for migraine or headache. May repeat in 2 hours if headache persists or recurs.   Yes Historical Provider, MD  oxyCODONE-acetaminophen (PERCOCET/ROXICET) 5-325 MG per tablet Take 1 tablet by mouth once. 10/11/13   Nelia Shiobert L Shaunette Gassner, MD  pantoprazole (PROTONIX) 40 MG tablet Take 1 tablet (40 mg total) by mouth daily. 10/12/13   Nelia Shiobert L Dewanda Fennema, MD  promethazine (PHENERGAN) 25 MG tablet Take 1 tablet (25 mg total) by mouth once. 10/11/13   Nelia Shiobert L Jaliana Medellin, MD   BP 119/73  Pulse 99  Temp(Src) 98.8 F (37.1 C) (Oral)  Resp 20  Ht 5\' 4"  (1.626 m)  Wt 129 lb (58.514 kg)  BMI 22.13 kg/m2  SpO2 100%  LMP 09/14/2013 Physical Exam  Nursing note and vitals reviewed. Constitutional: She is oriented to person, place, and time. She appears well-developed and well-nourished. No distress.  HENT:  Head: Normocephalic and atraumatic.  Eyes: Pupils are equal, round, and reactive to light.  Neck: Normal range of motion.  Cardiovascular: Normal rate and intact distal pulses.   Pulmonary/Chest: No respiratory distress.  Abdominal: Normal appearance. She exhibits no distension. There is no tenderness. There is no rebound and no guarding.  Musculoskeletal: Normal range of motion.  Neurological: She is alert and oriented to person, place, and time. No cranial nerve deficit.  Skin: Skin is warm and dry. No rash noted.  Psychiatric: She  has a normal mood and affect. Her behavior is normal.    ED Course  Procedures (including critical care time) Medications  pantoprazole (PROTONIX) EC tablet 80 mg (not administered)  promethazine (PHENERGAN) tablet 25 mg (not administered)  oxyCODONE-acetaminophen (PERCOCET/ROXICET) 5-325 MG per tablet 1 tablet (not administered)    Labs Review Labs Reviewed  CBC WITH DIFFERENTIAL - Abnormal; Notable for the following:    HCT 33.6 (*)    All other components within normal limits  COMPREHENSIVE METABOLIC PANEL - Abnormal; Notable for the following:    Potassium 3.6 (*)    Total Bilirubin <0.2 (*)    All other components within normal limits  URINALYSIS, ROUTINE W REFLEX MICROSCOPIC - Abnormal; Notable for the following:    Hgb urine dipstick SMALL (*)    All other components within normal limits  URINE MICROSCOPIC-ADD ON    Imaging Review No results found.    MDM   Final diagnoses:  PUD (peptic ulcer disease)        Nelia Shiobert L Vamsi Apfel, MD 10/11/13 1902

## 2013-10-11 NOTE — ED Notes (Signed)
Pt reports having an ulcer and n/v since yesterday, reports vomiting blood and black stools.

## 2013-10-11 NOTE — Discharge Instructions (Signed)
Peptic Ulcer A peptic ulcer is a sore in the lining of in your esophagus (esophageal ulcer), stomach (gastric ulcer), or in the first part of your small intestine (duodenal ulcer). The ulcer causes erosion into the deeper tissue. CAUSES  Normally, the lining of the stomach and the small intestine protects itself from the acid that digests food. The protective lining can be damaged by:  An infection caused by a bacterium called Helicobacter pylori (H. pylori).  Regular use of nonsteroidal anti-inflammatory drugs (NSAIDs), such as ibuprofen or aspirin.  Smoking tobacco. Other risk factors include being older than 50, drinking alcohol excessively, and having a family history of ulcer disease.  SYMPTOMS   Burning pain or gnawing in the area between the chest and the belly button.  Heartburn.  Nausea and vomiting.  Bloating. The pain can be worse on an empty stomach and at night. If the ulcer results in bleeding, it can cause:  Black, tarry stools.  Vomiting of bright red blood.  Vomiting of coffee ground looking materials. DIAGNOSIS  A diagnosis is usually made based upon your history and an exam. Other tests and procedures may be performed to find the cause of the ulcer. Finding a cause will help determine the best treatment. Tests and procedures may include:  Blood tests, stool tests, or breath tests to check for the bacterium H. pylori.  An upper gastrointestinal (GI) series of the esophagus, stomach, and small intestine.  An endoscopy to examine the esophagus, stomach, and small intestine.  A biopsy. TREATMENT  Treatment may include:  Eliminating the cause of the ulcer, such as smoking, NSAIDs, or alcohol.  Medicines to reduce the amount of acid in your digestive tract.  Antibiotic medicines if the ulcer is caused by the H. pylori bacterium.  An upper endoscopy to treat a bleeding ulcer.  Surgery if the bleeding is severe or if the ulcer created a hole somewhere in the  digestive system. HOME CARE INSTRUCTIONS   Avoid tobacco, alcohol, and caffeine. Smoking can increase the acid in the stomach, and continued smoking will impair the healing of ulcers.  Avoid foods and drinks that seem to cause discomfort or aggravate your ulcer.  Only take medicines as directed by your caregiver. Do not substitute over-the-counter medicines for prescription medicines without talking to your caregiver.  Keep any follow-up appointments and tests as directed. SEEK MEDICAL CARE IF:   Your do not improve within 7 days of starting treatment.  You have ongoing indigestion or heartburn. SEEK IMMEDIATE MEDICAL CARE IF:   You have sudden, sharp, or persistent abdominal pain.  You have bloody or dark black, tarry stools.  You vomit blood or vomit that looks like coffee grounds.  You become light headed, weak, or feel faint.  You become sweaty or clammy. MAKE SURE YOU:   Understand these instructions.  Will watch your condition.  Will get help right away if you are not doing well or get worse. Document Released: 05/30/2000 Document Revised: 02/25/2012 Document Reviewed: 12/31/2011 Exit[employer] Patient Information 2014 [employer].  

## 2013-10-30 ENCOUNTER — Encounter (HOSPITAL_BASED_OUTPATIENT_CLINIC_OR_DEPARTMENT_OTHER): Payer: Self-pay | Admitting: Emergency Medicine

## 2013-10-30 ENCOUNTER — Emergency Department (HOSPITAL_BASED_OUTPATIENT_CLINIC_OR_DEPARTMENT_OTHER)
Admission: EM | Admit: 2013-10-30 | Discharge: 2013-10-30 | Disposition: A | Discharge status: Home / Self Care | Payer: Managed Care, Other (non HMO) | Attending: Emergency Medicine | Admitting: Emergency Medicine

## 2013-10-30 DIAGNOSIS — M545 Low back pain, unspecified: Secondary | ICD-10-CM | POA: Insufficient documentation

## 2013-10-30 DIAGNOSIS — K219 Gastro-esophageal reflux disease without esophagitis: Secondary | ICD-10-CM | POA: Insufficient documentation

## 2013-10-30 DIAGNOSIS — J45909 Unspecified asthma, uncomplicated: Secondary | ICD-10-CM | POA: Insufficient documentation

## 2013-10-30 DIAGNOSIS — Z3202 Encounter for pregnancy test, result negative: Secondary | ICD-10-CM | POA: Insufficient documentation

## 2013-10-30 DIAGNOSIS — R350 Frequency of micturition: Secondary | ICD-10-CM | POA: Insufficient documentation

## 2013-10-30 DIAGNOSIS — F17200 Nicotine dependence, unspecified, uncomplicated: Secondary | ICD-10-CM | POA: Insufficient documentation

## 2013-10-30 DIAGNOSIS — G8929 Other chronic pain: Secondary | ICD-10-CM | POA: Insufficient documentation

## 2013-10-30 DIAGNOSIS — Z872 Personal history of diseases of the skin and subcutaneous tissue: Secondary | ICD-10-CM | POA: Insufficient documentation

## 2013-10-30 DIAGNOSIS — M549 Dorsalgia, unspecified: Secondary | ICD-10-CM

## 2013-10-30 DIAGNOSIS — G43909 Migraine, unspecified, not intractable, without status migrainosus: Secondary | ICD-10-CM | POA: Insufficient documentation

## 2013-10-30 DIAGNOSIS — Z79899 Other long term (current) drug therapy: Secondary | ICD-10-CM | POA: Insufficient documentation

## 2013-10-30 DIAGNOSIS — IMO0002 Reserved for concepts with insufficient information to code with codable children: Secondary | ICD-10-CM | POA: Insufficient documentation

## 2013-10-30 DIAGNOSIS — Z862 Personal history of diseases of the blood and blood-forming organs and certain disorders involving the immune mechanism: Secondary | ICD-10-CM | POA: Insufficient documentation

## 2013-10-30 LAB — URINALYSIS, ROUTINE W REFLEX MICROSCOPIC
Bilirubin Urine: NEGATIVE
Glucose, UA: NEGATIVE mg/dL
Hgb urine dipstick: NEGATIVE
KETONES UR: NEGATIVE mg/dL
LEUKOCYTES UA: NEGATIVE
NITRITE: NEGATIVE
PROTEIN: NEGATIVE mg/dL
Specific Gravity, Urine: 1.015 (ref 1.005–1.030)
UROBILINOGEN UA: 1 mg/dL (ref 0.0–1.0)
pH: 6 (ref 5.0–8.0)

## 2013-10-30 LAB — PREGNANCY, URINE: PREG TEST UR: NEGATIVE

## 2013-10-30 MED ORDER — CYCLOBENZAPRINE HCL 10 MG PO TABS: 10.0000 mg | ORAL_TABLET | Freq: Two times a day (BID) | ORAL | Status: DC | PRN

## 2013-10-30 MED ORDER — IBUPROFEN 800 MG PO TABS
800.0000 mg | ORAL_TABLET | Freq: Once | ORAL | Status: AC
  Administered 2013-10-30: 800 mg via ORAL
  Filled 2013-10-30: qty 1

## 2013-10-30 MED ORDER — CYCLOBENZAPRINE HCL 10 MG PO TABS
10.0000 mg | ORAL_TABLET | Freq: Once | ORAL | Status: AC
  Administered 2013-10-30: 10 mg via ORAL
  Filled 2013-10-30: qty 1

## 2013-10-30 MED ORDER — IBUPROFEN 800 MG PO TABS: 800.0000 mg | ORAL_TABLET | Freq: Three times a day (TID) | ORAL | Status: DC

## 2013-10-30 NOTE — ED Notes (Signed)
Reports back pain, urinary frequency and vaginal d/c x 1 week

## 2013-10-30 NOTE — ED Notes (Signed)
Assumed care of patient from Sylvia, RN. 

## 2013-10-30 NOTE — ED Provider Notes (Signed)
CSN: [id number]     Arrival date & time 10/30/13  1656 History   First MD Initiated Contact with Patient 10/30/13 1820     Chief Complaint  Patient presents with  . Back Pain     (Consider location/radiation/quality/duration/timing/severity/associated sxs/prior Treatment) Patient is a 43 y.o. female presenting with back pain. The history is provided by the patient. No language interpreter was used.  Back Pain Location:  Lumbar spine Quality:  Aching Associated symptoms: no abdominal pain and no fever   Associated symptoms comment:  Back pain that is worse in certain positions for the past week. No known injury. No chronic back pain. She reports urinary frequency but denies painful urination. No fever, nausea, vomiting. She denies vaginal discharge associated with the current symptoms.    Past Medical History  Diagnosis Date  . Migraine   . GERD (gastroesophageal reflux disease)   . Ulcer   . Toothache   . Chronic pain   . Bronchitis   . Sickle cell trait   . Asthma    Past Surgical History  Procedure Laterality Date  . Myringotomy      age 43  . Esophagogastroduodenoscopy (egd) with propofol N/A 04/04/2013    Procedure: ESOPHAGOGASTRODUODENOSCOPY (EGD) WITH PROPOFOL;  Surgeon: Willis ModenaWilliam Outlaw, MD;  Location: [facility];  Service: Endoscopy;  Laterality: N/A;  . Examination under anesthesia N/A 08/10/2013    Procedure: RECTAL EXAM UNDER ANESTHESIA;  Surgeon: Clovis Puhomas A. Cornett, MD;  Location: [facility];  Service: General;  Laterality: N/A;  ligation   Family History  Problem Relation Age of Onset  . Heart disease Mother   . Kidney disease Mother   . Hypertension Mother   . COPD Mother   . Diabetes Mother   . Heart disease Father    History  Substance Use Topics  . Smoking status: Current Every Day Smoker -- 0.50 packs/day for 15 years    Types: Cigarettes  . Smokeless tobacco: Never Used  . Alcohol Use: Yes     Comment: very rare   OB History    Grav Para Term Preterm Abortions TAB SAB Ect Mult Living                 Review of Systems  Constitutional: Negative for fever.  Respiratory: Negative for shortness of breath.   Gastrointestinal: Negative for nausea, vomiting and abdominal pain.  Genitourinary: Positive for frequency. Negative for flank pain and vaginal discharge.  Musculoskeletal: Positive for back pain.      Allergies  Hydrocodone-acetaminophen  Home Medications   Prior to Admission medications   Medication Sig Start Date End Date Taking? Authorizing Provider  albuterol (PROVENTIL HFA;VENTOLIN HFA) 108 (90 BASE) MCG/ACT inhaler Inhale 2 puffs into the lungs every 4 (four) hours as needed for wheezing or shortness of breath (cough). 07/08/12  Yes Olivia Mackielga M Otter, MD  esomeprazole (NEXIUM) 40 MG capsule Take 1 capsule (40 mg total) by mouth 2 (two) times daily before a meal. 04/04/13  Yes Willis ModenaWilliam Outlaw, MD  promethazine (PHENERGAN) 25 MG tablet Take 1 tablet (25 mg total) by mouth once. 10/11/13  Yes Nelia Shiobert L Beaton, MD  SUMAtriptan (IMITREX) 6 MG/0.5ML SOLN injection Inject 6 mg into the skin every 2 (two) hours as needed for migraine or headache. May repeat in 2 hours if headache persists or recurs.   Yes Historical Provider, MD  hydrocortisone (ANUSOL-HC) 25 MG suppository Place 25 mg rectally every other day.    Historical Provider, MD  oxyCODONE-acetaminophen (  PERCOCET/ROXICET) 5-325 MG per tablet Take 1 tablet by mouth once. 10/11/13   Nelia Shiobert L Beaton, MD  pantoprazole (PROTONIX) 40 MG tablet Take 1 tablet (40 mg total) by mouth daily. 10/12/13   Nelia Shiobert L Beaton, MD   BP 109/76  Temp(Src) 98 F (36.7 C) (Oral)  Resp 18  Ht 5\' 4"  (1.626 m)  Wt 129 lb (58.514 kg)  BMI 22.13 kg/m2  SpO2 100%  LMP 10/22/2013 Physical Exam  Constitutional: She is oriented to person, place, and time. She appears well-developed and well-nourished.  HENT:  Head: Normocephalic.  Neck: Normal range of motion. Neck supple.   Cardiovascular: Normal rate and regular rhythm.   Pulmonary/Chest: Effort normal and breath sounds normal.  Abdominal: Soft. Bowel sounds are normal. There is no tenderness. There is no rebound and no guarding.  Genitourinary:  No CVA tenderness.   Musculoskeletal: Normal range of motion.  Back is non-tender to palpation. No swelling.   Neurological: She is alert and oriented to person, place, and time.  Skin: Skin is warm and dry. No rash noted.  Psychiatric: She has a normal mood and affect.    ED Course  Procedures (including critical care time) Labs Review Labs Reviewed  PREGNANCY, URINE  URINALYSIS, ROUTINE W REFLEX MICROSCOPIC    Imaging Review No results found.   EKG Interpretation None      MDM   Final diagnoses:  None    1. Back pain  No evidence of urinary tract infection. Pain in back is worse with movement and certain positions. No fever. Likely muscular back pain.     Arnoldo HookerShari A Arelene Moroni, PA-C 10/30/13 1959

## 2013-10-30 NOTE — Discharge Instructions (Signed)
Back Pain, Adult Low back pain is very common. About 1 in 5 people have back pain.The cause of low back pain is rarely dangerous. The pain often gets better over time.About half of people with a sudden onset of back pain feel better in just 2 weeks. About 8 in 10 people feel better by 6 weeks.  CAUSES Some common causes of back pain include:  Strain of the muscles or ligaments supporting the spine.  Wear and tear (degeneration) of the spinal discs.  Arthritis.  Direct injury to the back. DIAGNOSIS Most of the time, the direct cause of low back pain is not known.However, back pain can be treated effectively even when the exact cause of the pain is unknown.Answering your caregiver's questions about your overall health and symptoms is one of the most accurate ways to make sure the cause of your pain is not dangerous. If your caregiver needs more information, he or she may order lab work or imaging tests (X-rays or MRIs).However, even if imaging tests show changes in your back, this usually does not require surgery. HOME CARE INSTRUCTIONS For many people, back pain returns.Since low back pain is rarely dangerous, it is often a condition that people can learn to manageon their own.   Remain active. It is stressful on the back to sit or stand in one place. Do not sit, drive, or stand in one place for more than 30 minutes at a time. Take short walks on level surfaces as soon as pain allows.Try to increase the length of time you walk each day.  Do not stay in bed.Resting more than 1 or 2 days can delay your recovery.  Do not avoid exercise or work.Your body is made to move.It is not dangerous to be active, even though your back may hurt.Your back will likely heal faster if you return to being active before your pain is gone.  Pay attention to your body when you bend and lift. Many people have less discomfortwhen lifting if they bend their knees, keep the load close to their bodies,and  avoid twisting. Often, the most comfortable positions are those that put less stress on your recovering back.  Find a comfortable position to sleep. Use a firm mattress and lie on your side with your knees slightly bent. If you lie on your back, put a pillow under your knees.  Only take over-the-counter or prescription medicines as directed by your caregiver. Over-the-counter medicines to reduce pain and inflammation are often the most helpful.Your caregiver may prescribe muscle relaxant drugs.These medicines help dull your pain so you can more quickly return to your normal activities and healthy exercise.  Put ice on the injured area.  Put ice in a plastic bag.  Place a towel between your skin and the bag.  Leave the ice on for 15-20 minutes, 03-04 times a day for the first 2 to 3 days. After that, ice and heat may be alternated to reduce pain and spasms.  Ask your caregiver about trying back exercises and gentle massage. This may be of some benefit.  Avoid feeling anxious or stressed.Stress increases muscle tension and can worsen back pain.It is important to recognize when you are anxious or stressed and learn ways to manage it.Exercise is a great option. SEEK MEDICAL CARE IF:  You have pain that is not relieved with rest or medicine.  You have pain that does not improve in 1 week.  You have new symptoms.  You are generally not feeling well. SEEK   IMMEDIATE MEDICAL CARE IF:   You have pain that radiates from your back into your legs.  You develop new bowel or bladder control problems.  You have unusual weakness or numbness in your arms or legs.  You develop nausea or vomiting.  You develop abdominal pain.  You feel faint. Document Released: 06/02/2005 Document Revised: 12/02/2011 Document Reviewed: 10/21/2010 [employer] Patient Information 2014 [employer].  

## 2013-11-01 NOTE — ED Provider Notes (Signed)
Medical screening examination/treatment/procedure(s) were performed by non-physician practitioner and as supervising physician I was immediately available for consultation/collaboration.   EKG Interpretation None       Hurman HornJohn M Joanne Salah, MD 11/01/13 0028

## 2013-11-10 ENCOUNTER — Emergency Department (HOSPITAL_COMMUNITY): Payer: Managed Care, Other (non HMO)

## 2013-11-10 ENCOUNTER — Encounter (HOSPITAL_COMMUNITY): Payer: Self-pay | Admitting: Emergency Medicine

## 2013-11-10 ENCOUNTER — Emergency Department (HOSPITAL_COMMUNITY)
Admission: EM | Admit: 2013-11-10 | Discharge: 2013-11-10 | Disposition: A | Discharge status: Home / Self Care | Payer: Managed Care, Other (non HMO) | Attending: Emergency Medicine | Admitting: Emergency Medicine

## 2013-11-10 DIAGNOSIS — Z79899 Other long term (current) drug therapy: Secondary | ICD-10-CM | POA: Insufficient documentation

## 2013-11-10 DIAGNOSIS — IMO0002 Reserved for concepts with insufficient information to code with codable children: Secondary | ICD-10-CM | POA: Insufficient documentation

## 2013-11-10 DIAGNOSIS — G43909 Migraine, unspecified, not intractable, without status migrainosus: Secondary | ICD-10-CM

## 2013-11-10 DIAGNOSIS — Z862 Personal history of diseases of the blood and blood-forming organs and certain disorders involving the immune mechanism: Secondary | ICD-10-CM | POA: Insufficient documentation

## 2013-11-10 DIAGNOSIS — Z9889 Other specified postprocedural states: Secondary | ICD-10-CM | POA: Insufficient documentation

## 2013-11-10 DIAGNOSIS — Z791 Long term (current) use of non-steroidal anti-inflammatories (NSAID): Secondary | ICD-10-CM | POA: Insufficient documentation

## 2013-11-10 DIAGNOSIS — F17200 Nicotine dependence, unspecified, uncomplicated: Secondary | ICD-10-CM | POA: Insufficient documentation

## 2013-11-10 DIAGNOSIS — G8929 Other chronic pain: Secondary | ICD-10-CM | POA: Insufficient documentation

## 2013-11-10 DIAGNOSIS — K219 Gastro-esophageal reflux disease without esophagitis: Secondary | ICD-10-CM | POA: Insufficient documentation

## 2013-11-10 DIAGNOSIS — J45909 Unspecified asthma, uncomplicated: Secondary | ICD-10-CM | POA: Insufficient documentation

## 2013-11-10 DIAGNOSIS — Z872 Personal history of diseases of the skin and subcutaneous tissue: Secondary | ICD-10-CM | POA: Insufficient documentation

## 2013-11-10 MED ORDER — KETOROLAC TROMETHAMINE 30 MG/ML IJ SOLN
30.0000 mg | Freq: Once | INTRAMUSCULAR | Status: AC
  Administered 2013-11-10: 30 mg via INTRAMUSCULAR
  Filled 2013-11-10: qty 1

## 2013-11-10 MED ORDER — SODIUM CHLORIDE 0.9 % IV BOLUS (SEPSIS)
1000.0000 mL | Freq: Once | INTRAVENOUS | Status: DC
Start: 2013-11-10 — End: 2013-11-10

## 2013-11-10 MED ORDER — METOCLOPRAMIDE HCL 5 MG/ML IJ SOLN
10.0000 mg | Freq: Once | INTRAMUSCULAR | Status: AC
  Administered 2013-11-10: 10 mg via INTRAMUSCULAR
  Filled 2013-11-10: qty 2

## 2013-11-10 MED ORDER — KETOROLAC TROMETHAMINE 30 MG/ML IJ SOLN: 30.0000 mg | Freq: Once | INTRAMUSCULAR | Status: DC

## 2013-11-10 MED ORDER — DIPHENHYDRAMINE HCL 50 MG/ML IJ SOLN
25.0000 mg | Freq: Once | INTRAMUSCULAR | Status: AC
  Administered 2013-11-10: 25 mg via INTRAMUSCULAR
  Filled 2013-11-10: qty 1

## 2013-11-10 MED ORDER — DIPHENHYDRAMINE HCL 50 MG/ML IJ SOLN
25.0000 mg | Freq: Once | INTRAMUSCULAR | Status: DC
  Filled 2013-11-10: qty 1

## 2013-11-10 MED ORDER — MORPHINE SULFATE 4 MG/ML IJ SOLN
4.0000 mg | Freq: Once | INTRAMUSCULAR | Status: DC
  Filled 2013-11-10: qty 1

## 2013-11-10 NOTE — ED Notes (Addendum)
Pt escorted to discharge window. Verbalized understanding discharge instructions. In no acute distress.  Pt will wait in lobby for ride.   

## 2013-11-10 NOTE — Discharge Instructions (Signed)
Follow up with your doctor for further evaluation and management of your headache. Refer to attached documents for more information.

## 2013-11-10 NOTE — ED Notes (Signed)
Patient transported to CT 

## 2013-11-10 NOTE — ED Notes (Signed)
Pt states hx of migraines.  States that she began having a migraine w/ pain in back of head.  C/o vomiting today also.

## 2013-11-10 NOTE — ED Notes (Signed)
Per request, Pt given ginger ale and crackers.

## 2013-11-10 NOTE — ED Provider Notes (Signed)
CSN: [id number]     Arrival date & time 11/10/13  0845 History   First MD Initiated Contact with Patient 11/10/13 [phone]     Chief Complaint  Patient presents with  . Headache     (Consider location/radiation/quality/duration/timing/severity/associated sxs/prior Treatment) HPI Comments: Patient is a 43 year old female with a past medical history of chronic migraines who presents with a headache since this morning. Patient reports a sudden onset and progressive worsening of the headache. The pain is sharp, constant and is located in back of head without radiation. Patient has tried imitrex for symptoms without relief. No alleviating/aggravating factors. Patient reports associated nausea, vomiting and photophobia. Patient denies fever, diarrhea, numbness/tingling, weakness, visual changes, congestion, chest pain, SOB, abdominal pain. Patient denies head trauma.     Patient is a 43 y.o. female presenting with headaches.  Headache Associated symptoms: vomiting   Associated symptoms: no abdominal pain, no diarrhea, no dizziness, no fatigue, no fever, no nausea and no neck pain     Past Medical History  Diagnosis Date  . Migraine   . GERD (gastroesophageal reflux disease)   . Ulcer   . Toothache   . Chronic pain   . Bronchitis   . Sickle cell trait   . Asthma    Past Surgical History  Procedure Laterality Date  . Myringotomy      age 43  . Esophagogastroduodenoscopy (egd) with propofol N/A 04/04/2013    Procedure: ESOPHAGOGASTRODUODENOSCOPY (EGD) WITH PROPOFOL;  Surgeon: Willis ModenaWilliam Outlaw, MD;  Location: [facility];  Service: Endoscopy;  Laterality: N/A;  . Examination under anesthesia N/A 08/10/2013    Procedure: RECTAL EXAM UNDER ANESTHESIA;  Surgeon: Clovis Puhomas A. Cornett, MD;  Location: [facility];  Service: General;  Laterality: N/A;  ligation   Family History  Problem Relation Age of Onset  . Heart disease Mother   . Kidney disease Mother   . Hypertension Mother    . COPD Mother   . Diabetes Mother   . Heart disease Father    History  Substance Use Topics  . Smoking status: Current Every Day Smoker -- 0.50 packs/day for 15 years    Types: Cigarettes  . Smokeless tobacco: Never Used  . Alcohol Use: Yes     Comment: very rare   OB History   Grav Para Term Preterm Abortions TAB SAB Ect Mult Living                 Review of Systems  Constitutional: Negative for fever, chills and fatigue.  HENT: Negative for trouble swallowing.   Eyes: Negative for visual disturbance.  Respiratory: Negative for shortness of breath.   Cardiovascular: Negative for chest pain and palpitations.  Gastrointestinal: Positive for vomiting. Negative for nausea, abdominal pain and diarrhea.  Genitourinary: Negative for dysuria and difficulty urinating.  Musculoskeletal: Negative for arthralgias and neck pain.  Skin: Negative for color change.  Neurological: Positive for headaches. Negative for dizziness and weakness.  Psychiatric/Behavioral: Negative for dysphoric mood.      Allergies  Hydrocodone-acetaminophen  Home Medications   Prior to Admission medications   Medication Sig Start Date End Date Taking? Authorizing Provider  albuterol (PROVENTIL HFA;VENTOLIN HFA) 108 (90 BASE) MCG/ACT inhaler Inhale 2 puffs into the lungs every 4 (four) hours as needed for wheezing or shortness of breath (cough). 07/08/12   Olivia Mackielga M Otter, MD  cyclobenzaprine (FLEXERIL) 10 MG tablet Take 1 tablet (10 mg total) by mouth 2 (two) times daily as needed for muscle spasms.  10/30/13   Shari A Upstill, PA-C  esomeprazole (NEXIUM) 40 MG capsule Take 1 capsule (40 mg total) by mouth 2 (two) times daily before a meal. 04/04/13   Willis Modena, MD  hydrocortisone (ANUSOL-HC) 25 MG suppository Place 25 mg rectally every other day.    Historical Provider, MD  ibuprofen (ADVIL,MOTRIN) 800 MG tablet Take 1 tablet (800 mg total) by mouth 3 (three) times daily. 10/30/13   Shari A Upstill, PA-C   oxyCODONE-acetaminophen (PERCOCET/ROXICET) 5-325 MG per tablet Take 1 tablet by mouth once. 10/11/13   Nelia Shi, MD  pantoprazole (PROTONIX) 40 MG tablet Take 1 tablet (40 mg total) by mouth daily. 10/12/13   Nelia Shi, MD  promethazine (PHENERGAN) 25 MG tablet Take 1 tablet (25 mg total) by mouth once. 10/11/13   Nelia Shi, MD  SUMAtriptan (IMITREX) 6 MG/0.5ML SOLN injection Inject 6 mg into the skin every 2 (two) hours as needed for migraine or headache. May repeat in 2 hours if headache persists or recurs.    Historical Provider, MD   BP 126/78  Pulse 58  Temp(Src) 98 F (36.7 C) (Oral)  Resp 16  SpO2 100%  LMP 10/22/2013 Physical Exam  Nursing note and vitals reviewed. Constitutional: She is oriented to person, place, and time. She appears well-developed and well-nourished. No distress.  HENT:  Head: Normocephalic and atraumatic.  Mouth/Throat: Oropharynx is clear and moist. No oropharyngeal exudate.  Eyes: Conjunctivae and EOM are normal.  Pinpoint pupils.   Neck: Normal range of motion.  Cardiovascular: Normal rate and regular rhythm.  Exam reveals no gallop and no friction rub.   No murmur heard. Pulmonary/Chest: Effort normal and breath sounds normal. She has no wheezes. She has no rales. She exhibits no tenderness.  Abdominal: Soft. She exhibits no distension. There is no tenderness. There is no rebound.  Musculoskeletal: Normal range of motion.  Neurological: She is alert and oriented to person, place, and time. No cranial nerve deficit. Coordination normal.  Extremity strength and sensation equal and intact bilaterally. Speech is goal-oriented. Moves limbs without ataxia.   Skin: Skin is warm and dry.  Psychiatric: She has a normal mood and affect. Her behavior is normal.    ED Course  Procedures (including critical care time) Labs Review Labs Reviewed - No data to display  Imaging Review Ct Head Wo Contrast  11/10/2013   CLINICAL DATA:  Migraine  headaches  EXAM: CT HEAD WITHOUT CONTRAST  TECHNIQUE: Contiguous axial images were obtained from the base of the skull through the vertex without intravenous contrast.  COMPARISON:  CT 08/18/2012  FINDINGS: No acute intracranial hemorrhage. No focal mass lesion. No CT evidence of acute infarction. No midline shift or mass effect. No hydrocephalus. Basilar cisterns are patent. Paranasal sinuses and mastoid air cells are clear.  IMPRESSION: Normal head CT.   Electronically Signed   By: Genevive Bi M.D.   On: 11/10/2013 09:52     EKG Interpretation None      MDM   Final diagnoses:  None    9:35 AM Patient will have CT head to rule out causes for sudden onset of headache. Patient will have morphine, reglan and benadryl. Vitals stable and patient afebrile.   10:51 AM CT head unremarkable for acute changes. Patient given medication IM. Patient is able to eat crackers and drink ginger ale. Patient is ready to leave. No further evaluation needed at this time. Vitals stable and patient afebrile. Patient instructed to follow up with  PCP.   Emilia Beck, PA-C 11/10/13 1052

## 2013-11-10 NOTE — ED Provider Notes (Signed)
Medical screening examination/treatment/procedure(s) were performed by non-physician practitioner and as supervising physician I was immediately available for consultation/collaboration.   EKG Interpretation None      Devoria Albe, MD, Armando Gang   Ward Givens, MD 11/10/13 1056

## 2013-11-12 ENCOUNTER — Emergency Department (HOSPITAL_BASED_OUTPATIENT_CLINIC_OR_DEPARTMENT_OTHER)
Admission: EM | Admit: 2013-11-12 | Discharge: 2013-11-12 | Disposition: A | Discharge status: Home / Self Care | Payer: Managed Care, Other (non HMO) | Attending: Emergency Medicine | Admitting: Emergency Medicine

## 2013-11-12 ENCOUNTER — Encounter (HOSPITAL_BASED_OUTPATIENT_CLINIC_OR_DEPARTMENT_OTHER): Payer: Self-pay | Admitting: Emergency Medicine

## 2013-11-12 DIAGNOSIS — Z79899 Other long term (current) drug therapy: Secondary | ICD-10-CM | POA: Insufficient documentation

## 2013-11-12 DIAGNOSIS — Z862 Personal history of diseases of the blood and blood-forming organs and certain disorders involving the immune mechanism: Secondary | ICD-10-CM | POA: Insufficient documentation

## 2013-11-12 DIAGNOSIS — M436 Torticollis: Secondary | ICD-10-CM | POA: Insufficient documentation

## 2013-11-12 DIAGNOSIS — K219 Gastro-esophageal reflux disease without esophagitis: Secondary | ICD-10-CM | POA: Insufficient documentation

## 2013-11-12 DIAGNOSIS — Z791 Long term (current) use of non-steroidal anti-inflammatories (NSAID): Secondary | ICD-10-CM | POA: Insufficient documentation

## 2013-11-12 DIAGNOSIS — G8929 Other chronic pain: Secondary | ICD-10-CM | POA: Insufficient documentation

## 2013-11-12 DIAGNOSIS — G43909 Migraine, unspecified, not intractable, without status migrainosus: Secondary | ICD-10-CM | POA: Insufficient documentation

## 2013-11-12 DIAGNOSIS — F17200 Nicotine dependence, unspecified, uncomplicated: Secondary | ICD-10-CM | POA: Insufficient documentation

## 2013-11-12 DIAGNOSIS — J45909 Unspecified asthma, uncomplicated: Secondary | ICD-10-CM | POA: Insufficient documentation

## 2013-11-12 MED ORDER — DEXAMETHASONE SODIUM PHOSPHATE 10 MG/ML IJ SOLN
10.0000 mg | Freq: Once | INTRAMUSCULAR | Status: AC
  Administered 2013-11-12: 10 mg via INTRAVENOUS
  Filled 2013-11-12: qty 1

## 2013-11-12 MED ORDER — KETOROLAC TROMETHAMINE 30 MG/ML IJ SOLN
30.0000 mg | Freq: Once | INTRAMUSCULAR | Status: AC
  Administered 2013-11-12: 30 mg via INTRAVENOUS
  Filled 2013-11-12: qty 1

## 2013-11-12 MED ORDER — IBUPROFEN 600 MG PO TABS
600.0000 mg | ORAL_TABLET | Freq: Four times a day (QID) | ORAL | Status: DC | PRN
Start: 2013-11-12 — End: 2013-11-16

## 2013-11-12 MED ORDER — DIPHENHYDRAMINE HCL 50 MG/ML IJ SOLN
25.0000 mg | Freq: Once | INTRAMUSCULAR | Status: AC
  Administered 2013-11-12: 25 mg via INTRAVENOUS
  Filled 2013-11-12: qty 1

## 2013-11-12 MED ORDER — METOCLOPRAMIDE HCL 5 MG/ML IJ SOLN
10.0000 mg | Freq: Once | INTRAMUSCULAR | Status: AC
  Administered 2013-11-12: 10 mg via INTRAVENOUS
  Filled 2013-11-12: qty 2

## 2013-11-12 MED ORDER — SODIUM CHLORIDE 0.9 % IV BOLUS (SEPSIS)
1000.0000 mL | Freq: Once | INTRAVENOUS | Status: AC
  Administered 2013-11-12: 1000 mL via INTRAVENOUS

## 2013-11-12 NOTE — ED Provider Notes (Signed)
CSN: [id number]     Arrival date & time 11/12/13  0204 History   First MD Initiated Contact with Patient 11/12/13 0225     Chief Complaint  Patient presents with  . Migraine     (Consider location/radiation/quality/duration/timing/severity/associated sxs/prior Treatment) HPI Comments: SUBJECTIVE: Jenny Evans is a 43 y.o. female who complains of headache SINCE 1 AM. Description of pain: throbbing pain, unilateral in the left frontal area, unilateral in the left temporal area. + hx of similar headaches in the past, frequency occasionally. Associated symptoms: facial pain and nausea. Pain relief: lying in a darkened room. Precipitating factors: patient is aware of none. She denies a history of recent head injury.  Prior neurological history: + for migraine headaches. Pt has had similar headaches in the past. She was seen with this type of headache 2 days ago - CT head was normal. Pt states that the pain went away with IM meds, but returned again tonight. She also typically has neck pain with her migraines - as she is having tonight. Neurologic Review of Systems - no amaurosis, diplopia, abnormal speech, unilateral numbness or weakness. No vomiting, seizures, altered mental status, loss of consciousness, new weakness, or numbness, no gait instability.    Patient is a 43 y.o. female presenting with migraines. The history is provided by the patient.  Migraine Associated symptoms include headaches. Pertinent negatives include no chest pain, no abdominal pain and no shortness of breath.    Past Medical History  Diagnosis Date  . Migraine   . GERD (gastroesophageal reflux disease)   . Ulcer   . Toothache   . Chronic pain   . Bronchitis   . Sickle cell trait   . Asthma    Past Surgical History  Procedure Laterality Date  . Myringotomy      age 85  . Esophagogastroduodenoscopy (egd) with propofol N/A 04/04/2013    Procedure: ESOPHAGOGASTRODUODENOSCOPY (EGD) WITH PROPOFOL;  Surgeon:  Willis Modena, MD;  Location: [facility];  Service: Endoscopy;  Laterality: N/A;  . Examination under anesthesia N/A 08/10/2013    Procedure: RECTAL EXAM UNDER ANESTHESIA;  Surgeon: Clovis Pu. Cornett, MD;  Location: [facility];  Service: General;  Laterality: N/A;  ligation   Family History  Problem Relation Age of Onset  . Heart disease Mother   . Kidney disease Mother   . Hypertension Mother   . COPD Mother   . Diabetes Mother   . Heart disease Father    History  Substance Use Topics  . Smoking status: Current Every Day Smoker -- 0.50 packs/day for 15 years    Types: Cigarettes  . Smokeless tobacco: Never Used  . Alcohol Use: Yes     Comment: very rare   OB History   Grav Para Term Preterm Abortions TAB SAB Ect Mult Living                 Review of Systems  Constitutional: Positive for activity change.  Respiratory: Negative for shortness of breath.   Cardiovascular: Negative for chest pain.  Gastrointestinal: Negative for nausea, vomiting and abdominal pain.  Genitourinary: Negative for dysuria.  Musculoskeletal: Positive for neck pain and neck stiffness.  Skin: Negative for rash.  Neurological: Positive for headaches. Negative for dizziness, seizures, weakness, light-headedness and numbness.  All other systems reviewed and are negative.     Allergies  Hydrocodone-acetaminophen  Home Medications   Prior to Admission medications   Medication Sig Start Date End Date Taking? Authorizing Provider  albuterol (PROVENTIL HFA;VENTOLIN HFA) 108 (90 BASE) MCG/ACT inhaler Inhale 2 puffs into the lungs every 4 (four) hours as needed for wheezing or shortness of breath (cough). 07/08/12   Olivia Mackielga M Otter, MD  esomeprazole (NEXIUM) 40 MG capsule Take 1 capsule (40 mg total) by mouth 2 (two) times daily before a meal. 04/04/13   Willis ModenaWilliam Outlaw, MD  ibuprofen (ADVIL,MOTRIN) 600 MG tablet Take 1 tablet (600 mg total) by mouth every 6 (six) hours as needed. 11/12/13    Derwood KaplanAnkit Ichiro Chesnut, MD  ibuprofen (ADVIL,MOTRIN) 800 MG tablet Take 1 tablet (800 mg total) by mouth 3 (three) times daily. 10/30/13   Shari A Upstill, PA-C  SUMAtriptan (IMITREX) 6 MG/0.5ML SOLN injection Inject 6 mg into the skin every 2 (two) hours as needed for migraine or headache. May repeat in 2 hours if headache persists or recurs.    Historical Provider, MD   BP 115/63  Pulse 66  Temp(Src) 98.5 F (36.9 C) (Oral)  Resp 20  SpO2 100%  LMP 10/22/2013 Physical Exam  Constitutional: She is oriented to person, place, and time. She appears well-developed and well-nourished.  HENT:  Head: Normocephalic and atraumatic.  Eyes: EOM are normal. Pupils are equal, round, and reactive to light.  1 mm and equal  Neck: Neck supple.  Cardiovascular: Normal rate, regular rhythm and normal heart sounds.   No murmur heard. Pulmonary/Chest: Effort normal. No respiratory distress.  Abdominal: Soft. She exhibits no distension. There is no tenderness. There is no rebound and no guarding.  Neurological: She is alert and oriented to person, place, and time.  Cerebellar exam is normal (finger to nose) Sensory exam normal for bilateral upper and lower extremities - and patient is able to discriminate between sharp and dull. Motor exam is 4+/5   Skin: Skin is warm and dry.    ED Course  Procedures (including critical care time) Labs Review Labs Reviewed - No data to display  Imaging Review Ct Head Wo Contrast  11/10/2013   CLINICAL DATA:  Migraine headaches  EXAM: CT HEAD WITHOUT CONTRAST  TECHNIQUE: Contiguous axial images were obtained from the base of the skull through the vertex without intravenous contrast.  COMPARISON:  CT 08/18/2012  FINDINGS: No acute intracranial hemorrhage. No focal mass lesion. No CT evidence of acute infarction. No midline shift or mass effect. No hydrocephalus. Basilar cisterns are patent. Paranasal sinuses and mastoid air cells are clear.  IMPRESSION: Normal head CT.    Electronically Signed   By: Genevive BiStewart  Edmunds M.D.   On: 11/10/2013 09:52     EKG Interpretation None      MDM   Final diagnoses:  Migraine    DDX includes: Primary headaches - including migrainous headaches, cluster headaches, tension headaches. ICH Carotid dissection Cavernous sinus thrombosis Meningitis Encephalitis Sinusitis Tumor Vascular headaches AV malformation Brain aneurysm Muscular headaches  A/P: Pt comes in with cc of headaches. Even though she has sudden onset headaches, and they are severe, with nausea an neck pain - concerns for life threatening secondary headaches because she has had similar headaches in the past (i repeatedly got confirmation for it), and a very recent visit for the same complain with neg CT.  Pt was given a headache cocktail  -and she states that she is now headache free ,and requesting food and drinks. Will discharge. Neuo f/u info given - although recommended to go through her pcp.   Derwood KaplanAnkit Jackie Littlejohn, MD 11/12/13 [phone]

## 2013-11-12 NOTE — ED Notes (Signed)
Pt c/o migraine headache x1hr with nausea

## 2013-11-12 NOTE — Discharge Instructions (Signed)
We saw you in the ER for headaches. All the labs and imaging are normal. °We are not sure what is causing your headaches, however, there appears to be no evidence of infection, bleeds or tumors based on our exam and results. ° °Please take motrin round the clock for the next 6 hours, and take other meds prescribed only for break through pain. °See your doctor if the pain persists, as you might need better medications or a specialist. ° ° °Headaches, Frequently Asked Questions °MIGRAINE HEADACHES °Q: What is migraine? What causes it? How can I treat it? °A: Generally, migraine headaches begin as a dull ache. Then they develop into a constant, throbbing, and pulsating pain. You may experience pain at the temples. You may experience pain at the front or back of one or both sides of the head. The pain is usually accompanied by a combination of: °· Nausea. °· Vomiting. °· Sensitivity to light and noise. °Some people (about 15%) experience an aura (see below) before an attack. The cause of migraine is believed to be chemical reactions in the brain. Treatment for migraine may include over-the-counter or prescription medications. It may also include self-help techniques. These include relaxation training and biofeedback.  °Q: What is an aura? °A: About 15% of people with migraine get an "aura". This is a sign of neurological symptoms that occur before a migraine headache. You may see wavy or jagged lines, dots, or flashing lights. You might experience tunnel vision or blind spots in one or both eyes. The aura can include visual or auditory hallucinations (something imagined). It may include disruptions in smell (such as strange odors), taste or touch. Other symptoms include: °· Numbness. °· A "pins and needles" sensation. °· Difficulty in recalling or speaking the correct word. °These neurological events may last as long as 60 minutes. These symptoms will fade as the headache begins. °Q: What is a trigger? °A: Certain  physical or environmental factors can lead to or "trigger" a migraine. These include: °· Foods. °· Hormonal changes. °· Weather. °· Stress. °It is important to remember that triggers are different for everyone. To help prevent migraine attacks, you need to figure out which triggers affect you. Keep a headache diary. This is a good way to track triggers. The diary will help you talk to your healthcare professional about your condition. °Q: Does weather affect migraines? °A: Bright sunshine, hot, humid conditions, and drastic changes in barometric pressure may lead to, or "trigger," a migraine attack in some people. But studies have shown that weather does not act as a trigger for everyone with migraines. °Q: What is the link between migraine and hormones? °A: Hormones start and regulate many of your body's functions. Hormones keep your body in balance within a constantly changing environment. The levels of hormones in your body are unbalanced at times. Examples are during menstruation, pregnancy, or menopause. That can lead to a migraine attack. In fact, about three quarters of all women with migraine report that their attacks are related to the menstrual cycle.  °Q: Is there an increased risk of stroke for migraine sufferers? °A: The likelihood of a migraine attack causing a stroke is very remote. That is not to say that migraine sufferers cannot have a stroke associated with their migraines. In persons under age 40, the most common associated factor for stroke is migraine headache. But over the course of a person's normal life span, the occurrence of migraine headache may actually be associated with a reduced risk   of dying from cerebrovascular disease due to stroke.  Q: What are acute medications for migraine? A: Acute medications are used to treat the pain of the headache after it has started. Examples over-the-counter medications, NSAIDs, ergots, and triptans.  Q: What are the triptans? A: Triptans are the  newest class of abortive medications. They are specifically targeted to treat migraine. Triptans are vasoconstrictors. They moderate some chemical reactions in the brain. The triptans work on receptors in your brain. Triptans help to restore the balance of a neurotransmitter called serotonin. Fluctuations in levels of serotonin are thought to be a main cause of migraine.  Q: Are over-the-counter medications for migraine effective? A: Over-the-counter, or "OTC," medications may be effective in relieving mild to moderate pain and associated symptoms of migraine. But you should see your caregiver before beginning any treatment regimen for migraine.  Q: What are preventive medications for migraine? A: Preventive medications for migraine are sometimes referred to as "prophylactic" treatments. They are used to reduce the frequency, severity, and length of migraine attacks. Examples of preventive medications include antiepileptic medications, antidepressants, beta-blockers, calcium channel blockers, and NSAIDs (nonsteroidal anti-inflammatory drugs). Q: Why are anticonvulsants used to treat migraine? A: During the past few years, there has been an increased interest in antiepileptic drugs for the prevention of migraine. They are sometimes referred to as "anticonvulsants". Both epilepsy and migraine may be caused by similar reactions in the brain.  Q: Why are antidepressants used to treat migraine? A: Antidepressants are typically used to treat people with depression. They may reduce migraine frequency by regulating chemical levels, such as serotonin, in the brain.  Q: What alternative therapies are used to treat migraine? A: The term "alternative therapies" is often used to describe treatments considered outside the scope of conventional Western medicine. Examples of alternative therapy include acupuncture, acupressure, and yoga. Another common alternative treatment is herbal therapy. Some herbs are believed to  relieve headache pain. Always discuss alternative therapies with your caregiver before proceeding. Some herbal products contain arsenic and other toxins. TENSION HEADACHES Q: What is a tension-type headache? What causes it? How can I treat it? A: Tension-type headaches occur randomly. They are often the result of temporary stress, anxiety, fatigue, or anger. Symptoms include soreness in your temples, a tightening band-like sensation around your head (a "vice-like" ache). Symptoms can also include a pulling feeling, pressure sensations, and contracting head and neck muscles. The headache begins in your forehead, temples, or the back of your head and neck. Treatment for tension-type headache may include over-the-counter or prescription medications. Treatment may also include self-help techniques such as relaxation training and biofeedback. CLUSTER HEADACHES Q: What is a cluster headache? What causes it? How can I treat it? A: Cluster headache gets its name because the attacks come in groups. The pain arrives with little, if any, warning. It is usually on one side of the head. A tearing or bloodshot eye and a runny nose on the same side of the headache may also accompany the pain. Cluster headaches are believed to be caused by chemical reactions in the brain. They have been described as the most severe and intense of any headache type. Treatment for cluster headache includes prescription medication and oxygen. SINUS HEADACHES Q: What is a sinus headache? What causes it? How can I treat it? A: When a cavity in the bones of the face and skull (a sinus) becomes inflamed, the inflammation will cause localized pain. This condition is usually the result of an allergic reaction,  a tumor, or an infection. If your headache is caused by a sinus blockage, such as an infection, you will probably have a fever. An x-ray will confirm a sinus blockage. Your caregiver's treatment might include antibiotics for the infection, as  well as antihistamines or decongestants.  REBOUND HEADACHES Q: What is a rebound headache? What causes it? How can I treat it? A: A pattern of taking acute headache medications too often can lead to a condition known as "rebound headache." A pattern of taking too much headache medication includes taking it more than 2 days per week or in excessive amounts. That means more than the label or a caregiver advises. With rebound headaches, your medications not only stop relieving pain, they actually begin to cause headaches. Doctors treat rebound headache by tapering the medication that is being overused. Sometimes your caregiver will gradually substitute a different type of treatment or medication. Stopping may be a challenge. Regularly overusing a medication increases the potential for serious side effects. Consult a caregiver if you regularly use headache medications more than 2 days per week or more than the label advises. ADDITIONAL QUESTIONS AND ANSWERS Q: What is biofeedback? A: Biofeedback is a self-help treatment. Biofeedback uses special equipment to monitor your body's involuntary physical responses. Biofeedback monitors:  Breathing.  Pulse.  Heart rate.  Temperature.  Muscle tension.  Brain activity. Biofeedback helps you refine and perfect your relaxation exercises. You learn to control the physical responses that are related to stress. Once the technique has been mastered, you do not need the equipment any more. Q: Are headaches hereditary? A: Four out of five (80%) of people that suffer report a family history of migraine. Scientists are not sure if this is genetic or a family predisposition. Despite the uncertainty, a child has a 50% chance of having migraine if one parent suffers. The child has a 75% chance if both parents suffer.  Q: Can children get headaches? A: By the time they reach high school, most young people have experienced some type of headache. Many safe and effective  approaches or medications can prevent a headache from occurring or stop it after it has begun.  Q: What type of doctor should I see to diagnose and treat my headache? A: Start with your primary caregiver. Discuss his or her experience and approach to headaches. Discuss methods of classification, diagnosis, and treatment. Your caregiver may decide to recommend you to a headache specialist, depending upon your symptoms or other physical conditions. Having diabetes, allergies, etc., may require a more comprehensive and inclusive approach to your headache. The National Headache Foundation will provide, upon request, a list of [facility] physician members in your state. Document Released: 08/23/2003 Document Revised: 08/25/2011 Document Reviewed: 01/31/2008 [facility] Patient Information 2014 [place], [place].  Migraine Headache A migraine headache is an intense, throbbing pain on one or both sides of your head. A migraine can last for 30 minutes to several hours. CAUSES  The exact cause of a migraine headache is not always known. However, a migraine may be caused when nerves in the brain become irritated and release chemicals that cause inflammation. This causes pain. Certain things may also trigger migraines, such as:  Alcohol.  Smoking.  Stress.  Menstruation.  Aged cheeses.  Foods or drinks that contain nitrates, glutamate, aspartame, or tyramine.  Lack of sleep.  Chocolate.  Caffeine.  Hunger.  Physical exertion.  Fatigue.  Medicines used to treat chest pain (nitroglycerine), birth control pills, estrogen, and some blood pressure medicines.  SIGNS AND SYMPTOMS  Pain on one or both sides of your head.  Pulsating or throbbing pain.  Severe pain that prevents daily activities.  Pain that is aggravated by any physical activity.  Nausea, vomiting, or both.  Dizziness.  Pain with exposure to bright lights, loud noises, or activity.  General sensitivity to bright lights, loud  noises, or smells. Before you get a migraine, you may get warning signs that a migraine is coming (aura). An aura may include:  Seeing flashing lights.  Seeing bright spots, halos, or zig-zag lines.  Having tunnel vision or blurred vision.  Having feelings of numbness or tingling.  Having trouble talking.  Having muscle weakness. DIAGNOSIS  A migraine headache is often diagnosed based on:  Symptoms.  Physical exam.  A CT scan or MRI of your head. These imaging tests cannot diagnose migraines, but they can help rule out other causes of headaches. TREATMENT Medicines may be given for pain and nausea. Medicines can also be given to help prevent recurrent migraines.  HOME CARE INSTRUCTIONS  Only take over-the-counter or prescription medicines for pain or discomfort as directed by your health care provider. The use of long-term narcotics is not recommended.  Lie down in a dark, quiet room when you have a migraine.  Keep a journal to find out what may trigger your migraine headaches. For example, write down:  What you eat and drink.  How much sleep you get.  Any change to your diet or medicines.  Limit alcohol consumption.  Quit smoking if you smoke.  Get 7 9 hours of sleep, or as recommended by your health care provider.  Limit stress.  Keep lights dim if bright lights bother you and make your migraines worse. SEEK IMMEDIATE MEDICAL CARE IF:   Your migraine becomes severe.  You have a fever.  You have a stiff neck.  You have vision loss.  You have muscular weakness or loss of muscle control.  You start losing your balance or have trouble walking.  You feel faint or pass out.  You have severe symptoms that are different from your first symptoms. MAKE SURE YOU:   Understand these instructions.  Will watch your condition.  Will get help right away if you are not doing well or get worse. Document Released: 06/02/2005 Document Revised: 03/23/2013 Document  Reviewed: 02/07/2013 [facility] Patient Information 2014 [place], [place].

## 2013-11-16 ENCOUNTER — Encounter (HOSPITAL_COMMUNITY): Payer: Self-pay | Admitting: Emergency Medicine

## 2013-11-16 ENCOUNTER — Emergency Department (HOSPITAL_COMMUNITY)
Admission: EM | Admit: 2013-11-16 | Discharge: 2013-11-16 | Disposition: A | Discharge status: Home / Self Care | Payer: Managed Care, Other (non HMO) | Attending: Emergency Medicine | Admitting: Emergency Medicine

## 2013-11-16 DIAGNOSIS — G8929 Other chronic pain: Secondary | ICD-10-CM | POA: Insufficient documentation

## 2013-11-16 DIAGNOSIS — R519 Headache, unspecified: Secondary | ICD-10-CM

## 2013-11-16 DIAGNOSIS — F17200 Nicotine dependence, unspecified, uncomplicated: Secondary | ICD-10-CM | POA: Insufficient documentation

## 2013-11-16 DIAGNOSIS — J45909 Unspecified asthma, uncomplicated: Secondary | ICD-10-CM | POA: Insufficient documentation

## 2013-11-16 DIAGNOSIS — R51 Headache: Secondary | ICD-10-CM | POA: Insufficient documentation

## 2013-11-16 DIAGNOSIS — K219 Gastro-esophageal reflux disease without esophagitis: Secondary | ICD-10-CM | POA: Insufficient documentation

## 2013-11-16 DIAGNOSIS — D573 Sickle-cell trait: Secondary | ICD-10-CM | POA: Insufficient documentation

## 2013-11-16 DIAGNOSIS — J40 Bronchitis, not specified as acute or chronic: Secondary | ICD-10-CM | POA: Insufficient documentation

## 2013-11-16 DIAGNOSIS — K089 Disorder of teeth and supporting structures, unspecified: Secondary | ICD-10-CM | POA: Insufficient documentation

## 2013-11-16 DIAGNOSIS — L98499 Non-pressure chronic ulcer of skin of other sites with unspecified severity: Secondary | ICD-10-CM | POA: Insufficient documentation

## 2013-11-16 MED ORDER — METOCLOPRAMIDE HCL 5 MG/ML IJ SOLN
10.0000 mg | Freq: Once | INTRAMUSCULAR | Status: AC
  Administered 2013-11-16: 10 mg via INTRAVENOUS
  Filled 2013-11-16: qty 2

## 2013-11-16 MED ORDER — DIPHENHYDRAMINE HCL 50 MG/ML IJ SOLN
50.0000 mg | Freq: Once | INTRAMUSCULAR | Status: AC
  Administered 2013-11-16: 50 mg via INTRAMUSCULAR
  Filled 2013-11-16: qty 1

## 2013-11-16 NOTE — ED Notes (Signed)
Pt c/o of migraine starting a hour ago. Reports some nausea. Pt has hx of migraines

## 2013-11-16 NOTE — ED Provider Notes (Signed)
CSN: [id number]     Arrival date & time 11/16/13  0304 History   First MD Initiated Contact with Patient 11/16/13 [phone]     Chief Complaint  Patient presents with  . Migraine     Patient is a 43 y.o. female presenting with migraines. The history is provided by the patient.  Migraine This is a recurrent problem. The current episode started 1 to 2 hours ago. The problem occurs constantly. The problem has been gradually worsening. Associated symptoms include headaches. Exacerbated by: light. Nothing relieves the symptoms. She has tried rest for the symptoms. The treatment provided no relief.  pt reports onset of headache 1 hour ago.  She reports it woke her up from sleep It is located on left side of head and around ear Similar to prior HA No trauma No visual/hearing loss No focal weakness No fever reported No tick bites reported  She report she is supposed to see neurologist soon for her headche  Past Medical History  Diagnosis Date  . Migraine   . GERD (gastroesophageal reflux disease)   . Ulcer   . Toothache   . Chronic pain   . Bronchitis   . Sickle cell trait   . Asthma    Past Surgical History  Procedure Laterality Date  . Myringotomy      age 43  . Esophagogastroduodenoscopy (egd) with propofol N/A 04/04/2013    Procedure: ESOPHAGOGASTRODUODENOSCOPY (EGD) WITH PROPOFOL;  Surgeon: Willis ModenaWilliam Outlaw, MD;  Location: [facility];  Service: Endoscopy;  Laterality: N/A;  . Examination under anesthesia N/A 08/10/2013    Procedure: RECTAL EXAM UNDER ANESTHESIA;  Surgeon: Clovis Puhomas A. Cornett, MD;  Location: [facility];  Service: General;  Laterality: N/A;  ligation   Family History  Problem Relation Age of Onset  . Heart disease Mother   . Kidney disease Mother   . Hypertension Mother   . COPD Mother   . Diabetes Mother   . Heart disease Father    History  Substance Use Topics  . Smoking status: Current Every Day Smoker -- 0.50 packs/day for 15 years    Types:  Cigarettes  . Smokeless tobacco: Never Used  . Alcohol Use: Yes     Comment: very rare   OB History   Grav Para Term Preterm Abortions TAB SAB Ect Mult Living                 Review of Systems  Constitutional: Negative for fever.  Eyes: Negative for visual disturbance.  Gastrointestinal: Positive for nausea.  Neurological: Positive for headaches. Negative for weakness.  All other systems reviewed and are negative.     Allergies  Hydrocodone-acetaminophen  Home Medications   Prior to Admission medications   Medication Sig Start Date End Date Taking? Authorizing Provider  esomeprazole (NEXIUM) 40 MG capsule Take 1 capsule (40 mg total) by mouth 2 (two) times daily before a meal. 04/04/13  Yes Willis ModenaWilliam Outlaw, MD  SUMAtriptan (IMITREX) 6 MG/0.5ML SOLN injection Inject 6 mg into the skin every 2 (two) hours as needed for migraine or headache. May repeat in 2 hours if headache persists or recurs.   Yes Historical Provider, MD   BP 103/86  Pulse 68  Temp(Src) 97.9 F (36.6 C) (Oral)  Resp 16  SpO2 100%  LMP 10/22/2013 Physical Exam CONSTITUTIONAL: Well developed/well nourished HEAD: Normocephalic/atraumatic EYES: EOMI/PERRL, no nystagmus, no ptosis, pupils pinpoint ENMT: Mucous membranes moist Left TM/right TM clear/intact NECK: supple no meningeal signs, no bruits, no  bruising noted to neck CV: S1/S2 noted, no murmurs/rubs/gallops noted LUNGS: Lungs are clear to auscultation bilaterally, no apparent distress ABDOMEN: soft, nontender, no rebound or guarding GU:no cva tenderness NEURO:Awake/alert, facies symmetric, no arm or leg drift is noted Equal 5/5 strength with shoulder abduction, elbow flex/extension, wrist flex/extension in upper extremities and equal hand grips bilaterally Equal 5/5 strength with hip flexion,knee flex/extension, foot dorsi/plantar flexion Cranial nerves 3/4/5/6/12/22/08/11/12 tested and intact Gait normal without ataxia No past pointing Sensation  to light touch intact in all extremities EXTREMITIES: pulses normal, full ROM SKIN: warm, color normal PSYCH: no abnormalities of mood noted    ED Course  Procedures  Pt with HA that similar to prior episodes She has had multiple ED evaluations including negative CT head I doubt SAH/meningitis/carotid dissection or other acute neurologic emergency  Pt requested discharge after initial round of meds She appears stable/appropriate for d/c home  MDM   Final diagnoses:  Headache    Nursing notes including past medical history and social history reviewed and considered in documentation Previous records reviewed and considered     Joya Gaskins, MD 11/16/13 [phone]

## 2013-11-16 NOTE — ED Notes (Signed)
Pt upset she only got Reglan and Benadryl. Pt requesting to have IV removed and discharge papers. Dr. Bebe Shaggy notified

## 2013-11-16 NOTE — Discharge Instructions (Signed)

## 2013-11-18 ENCOUNTER — Encounter (HOSPITAL_COMMUNITY): Payer: Self-pay | Admitting: Emergency Medicine

## 2013-11-18 ENCOUNTER — Emergency Department (HOSPITAL_COMMUNITY)
Admission: EM | Admit: 2013-11-18 | Discharge: 2013-11-18 | Disposition: A | Discharge status: Home / Self Care | Payer: Managed Care, Other (non HMO) | Attending: Emergency Medicine | Admitting: Emergency Medicine

## 2013-11-18 DIAGNOSIS — Z872 Personal history of diseases of the skin and subcutaneous tissue: Secondary | ICD-10-CM | POA: Insufficient documentation

## 2013-11-18 DIAGNOSIS — Z862 Personal history of diseases of the blood and blood-forming organs and certain disorders involving the immune mechanism: Secondary | ICD-10-CM | POA: Insufficient documentation

## 2013-11-18 DIAGNOSIS — G8929 Other chronic pain: Secondary | ICD-10-CM | POA: Insufficient documentation

## 2013-11-18 DIAGNOSIS — H6690 Otitis media, unspecified, unspecified ear: Secondary | ICD-10-CM | POA: Insufficient documentation

## 2013-11-18 DIAGNOSIS — J45909 Unspecified asthma, uncomplicated: Secondary | ICD-10-CM | POA: Insufficient documentation

## 2013-11-18 DIAGNOSIS — H7292 Unspecified perforation of tympanic membrane, left ear: Secondary | ICD-10-CM

## 2013-11-18 DIAGNOSIS — K219 Gastro-esophageal reflux disease without esophagitis: Secondary | ICD-10-CM | POA: Insufficient documentation

## 2013-11-18 DIAGNOSIS — G43909 Migraine, unspecified, not intractable, without status migrainosus: Secondary | ICD-10-CM | POA: Insufficient documentation

## 2013-11-18 DIAGNOSIS — Z79899 Other long term (current) drug therapy: Secondary | ICD-10-CM | POA: Insufficient documentation

## 2013-11-18 DIAGNOSIS — H6692 Otitis media, unspecified, left ear: Secondary | ICD-10-CM

## 2013-11-18 MED ORDER — IBUPROFEN 800 MG PO TABS
800.0000 mg | ORAL_TABLET | Freq: Once | ORAL | Status: AC
  Administered 2013-11-18: 800 mg via ORAL
  Filled 2013-11-18: qty 1

## 2013-11-18 MED ORDER — OXYCODONE-ACETAMINOPHEN 5-325 MG PO TABS: 1.0000 | ORAL_TABLET | ORAL | Status: DC | PRN

## 2013-11-18 MED ORDER — AMOXICILLIN 500 MG PO CAPS: 1000.0000 mg | ORAL_CAPSULE | Freq: Two times a day (BID) | ORAL | Status: DC

## 2013-11-18 MED ORDER — OXYCODONE-ACETAMINOPHEN 5-325 MG PO TABS
2.0000 | ORAL_TABLET | Freq: Once | ORAL | Status: AC
  Administered 2013-11-18: 2 via ORAL
  Filled 2013-11-18: qty 2

## 2013-11-18 MED ORDER — AMOXICILLIN 500 MG PO CAPS
1000.0000 mg | ORAL_CAPSULE | Freq: Once | ORAL | Status: AC
  Administered 2013-11-18: 1000 mg via ORAL
  Filled 2013-11-18: qty 2

## 2013-11-18 NOTE — Discharge Instructions (Signed)
Eardrum Perforation The eardrum is a thin, round tissue inside the ear that separates the ear canal from the middle ear. This is the tissue that detects sound and enables you to hear. The eardrum can be punctured or torn (perforated). Eardrums generally heal without help and with little or no permanent hearing loss. CAUSES   Sudden pressure changes that happen in situations like scuba diving or flying in an airplane.  Foreign objects in the ear.  Inserting a cotton-tipped swab in the ear.  Loud noise.  Trauma to the ear. SYMPTOMS   Hearing loss.  Ear pain.  Ringing in the ears.  Discharge or bleeding from the ear.  Dizziness.  Vomiting.  Facial paralysis. HOME CARE INSTRUCTIONS   Keep your ear dry, as this improves healing. Swimming, diving, and showers are not allowed until healing is complete. While bathing, protect the ear by placing a piece of cotton covered with petroleum jelly in the outer ear canal.  Only take over-the-counter or prescription medicines for pain, discomfort, or fever as directed by your caregiver.  Blow your nose gently. Forceful blowing increases the pressure in the middle ear and may cause further injury or delay healing.  Resume normal activities, such as showering, when the perforation has healed. Your caregiver can let you know when this has occurred.  Talk to your caregiver before flying on an airplane. Air travel is generally allowed with a perforated eardrum.  If your caregiver has given you a follow-up appointment, it is very important to keep that appointment. Failure to keep the appointment could result in a chronic or permanent injury, pain, hearing loss, and disability. SEEK IMMEDIATE MEDICAL CARE IF:   You have bleeding or pus coming from your ear.  You have problems with balance, dizziness, nausea, or vomiting.  You develop increased pain.  You have a fever. MAKE SURE YOU:   Understand these instructions.  Will watch your  condition.  Will get help right away if you are not doing well or get worse. Document Released: 05/30/2000 Document Revised: 08/25/2011 Document Reviewed: 06/01/2008 [facility] Patient Information 2014 [place], [place].  Draining Ear Ear wax, pus, blood and other fluids are examples of the different types of drainage from ears. Drops or cream may be needed to lessen the itching which may occur with ear drainage. CAUSES   Skin irritations in the ear.  Ear infection.  Swimmer's ear.  Ruptured eardrum.  Foreign object in the ear canal.  Sudden pressure changes.  Head injury. HOME CARE INSTRUCTIONS   Only take over-the-counter or prescription medicines for pain, fever, or discomfort as directed by your caregiver.  Do not rub the ear canal with cotton-tipped swabs.  Do not swim until your caregiver says it is okay.  Before you take a shower, cover a cotton ball with petroleum jelly to keep water out.  Limit exposure to smoke. Secondhand smoke can increase the chance for ear infections.  Keep up with immunizations.  Wash your hands well.  Keep all follow-up appointments to examine the ear and evaluate hearing. SEEK MEDICAL CARE IF:   You have increased drainage.  You have ear pain, a fever, or drainage that is not getting better after 48 hours of antibiotics.  You are unusually tired. SEEK IMMEDIATE MEDICAL CARE IF:  You have severe ear pain or headache.  The patient is older than 3 months with a rectal or oral temperature of 102 F (38.9 C) or higher.  The patient is 54 months old or younger  with a rectal temperature of 100.4 F (38 C) or higher.  You vomit.  You feel dizzy.  You have a seizure.  You have new hearing loss. MAKE SURE YOU:   Understand these instructions.  Will watch your condition.  Will get help right away if you are not doing well or get worse. Document Released: 06/02/2005 Document Revised: 08/25/2011 Document Reviewed:  04/05/2009 [facility] Patient Information 2014 [place], [place].  Otitis Media, Adult Otitis media is redness, soreness, and swelling (inflammation) of the middle ear. Otitis media may be caused by allergies or, most commonly, by infection. Often it occurs as a complication of the common cold. SIGNS AND SYMPTOMS Symptoms of otitis media may include:  Earache.  Fever.  Ringing in your ear.  Headache.  Leakage of fluid from the ear. DIAGNOSIS To diagnose otitis media, your health care provider will examine your ear with an otoscope. This is an instrument that allows your health care provider to see into your ear in order to examine your eardrum. Your health care provider also will ask you questions about your symptoms. TREATMENT  Typically, otitis media resolves on its own within 3 5 days. Your health care provider may prescribe medicine to ease your symptoms of pain. If otitis media does not resolve within 5 days or is recurrent, your health care provider may prescribe antibiotic medicines if he or she suspects that a bacterial infection is the cause. HOME CARE INSTRUCTIONS   Take your medicine as directed until it is gone, even if you feel better after the first few days.  Only take over-the-counter or prescription medicines for pain, discomfort, or fever as directed by your health care provider.  Follow up with your health care provider as directed. SEEK MEDICAL CARE IF:  You have otitis media only in one ear or bleeding from your nose or both.  You notice a lump on your neck.  You are not getting better in 3 5 days.  You feel worse instead of better. SEEK IMMEDIATE MEDICAL CARE IF:   You have pain that is not controlled with medicine.  You have swelling, redness, or pain around your ear or stiffness in your neck.  You notice that part of your face is paralyzed.  You notice that the bone behind your ear (mastoid) is tender when you touch it. MAKE SURE YOU:   Understand  these instructions.  Will watch your condition.  Will get help right away if you are not doing well or get worse. Document Released: 03/07/2004 Document Revised: 03/23/2013 Document Reviewed: 12/28/2012 [facility] Patient Information 2014 [place], [place].

## 2013-11-18 NOTE — ED Notes (Signed)
Pt states that she began to have a left earache approx 1 hr ago; pt reports clear drainage from ear; pt states that the earache has caused her to have a headache as well

## 2013-11-27 NOTE — ED Provider Notes (Signed)
CSN: [id number]     Arrival date & time 11/18/13  0413 History   First MD Initiated Contact with Patient 11/18/13 0441     Chief Complaint  Patient presents with  . Otalgia     (Consider location/radiation/quality/duration/timing/severity/associated sxs/prior Treatment) HPI  43 year old female with pain in her left ear. Acute onset approximately one hour prior to arrival. She feels like someone is stabbing her in her ear.  Drainage. No fever or chills. Generalized headache. Denies trauma. No neck pain/stiffness. No intervention prior to arrival.  Past Medical History  Diagnosis Date  . Migraine   . GERD (gastroesophageal reflux disease)   . Ulcer   . Toothache   . Chronic pain   . Bronchitis   . Sickle cell trait   . Asthma    Past Surgical History  Procedure Laterality Date  . Myringotomy      age 43  . Esophagogastroduodenoscopy (egd) with propofol N/A 04/04/2013    Procedure: ESOPHAGOGASTRODUODENOSCOPY (EGD) WITH PROPOFOL;  Surgeon: Willis ModenaWilliam Outlaw, MD;  Location: [facility];  Service: Endoscopy;  Laterality: N/A;  . Examination under anesthesia N/A 08/10/2013    Procedure: RECTAL EXAM UNDER ANESTHESIA;  Surgeon: Clovis Puhomas A. Cornett, MD;  Location: [facility];  Service: General;  Laterality: N/A;  ligation   Family History  Problem Relation Age of Onset  . Heart disease Mother   . Kidney disease Mother   . Hypertension Mother   . COPD Mother   . Diabetes Mother   . Heart disease Father    History  Substance Use Topics  . Smoking status: Current Every Day Smoker -- 0.50 packs/day for 15 years    Types: Cigarettes  . Smokeless tobacco: Never Used  . Alcohol Use: Yes     Comment: very rare   OB History   Grav Para Term Preterm Abortions TAB SAB Ect Mult Living                 Review of Systems  All systems reviewed and negative, other than as noted in HPI.   Allergies  Hydrocodone-acetaminophen  Home Medications   Prior to Admission  medications   Medication Sig Start Date End Date Taking? Authorizing Provider  esomeprazole (NEXIUM) 40 MG capsule Take 1 capsule (40 mg total) by mouth 2 (two) times daily before a meal. 04/04/13  Yes Willis ModenaWilliam Outlaw, MD  SUMAtriptan (IMITREX) 6 MG/0.5ML SOLN injection Inject 6 mg into the skin every 2 (two) hours as needed for migraine or headache. May repeat in 2 hours if headache persists or recurs.   Yes Historical Provider, MD  amoxicillin (AMOXIL) 500 MG capsule Take 2 capsules (1,000 mg total) by mouth 2 (two) times daily. 11/18/13   Raeford RazorStephen Earlean Fidalgo, MD  oxyCODONE-acetaminophen (PERCOCET/ROXICET) 5-325 MG per tablet Take 1-2 tablets by mouth every 4 (four) hours as needed for severe pain. 11/18/13   Raeford RazorStephen Marigrace Mccole, MD   BP 126/83  Pulse 81  Temp(Src) 98.2 F (36.8 C) (Oral)  Resp 17  SpO2 99%  LMP 11/17/2013 Physical Exam  Nursing note and vitals reviewed. Constitutional: She is oriented to person, place, and time. She appears well-developed and well-nourished. No distress.  HENT:  Head: Normocephalic and atraumatic.  Acute otitis media. Tympanic membrane perforation. There is a small amount of watery milky appearing fluid in external aud canal. No apparent pain with manipulation of pinna. No mastoid skin changes/TTP.   Eyes: Conjunctivae are normal. Right eye exhibits no discharge. Left eye exhibits no discharge.  Neck: Neck supple.  Cardiovascular: Normal rate, regular rhythm and normal heart sounds.  Exam reveals no gallop and no friction rub.   No murmur heard. Pulmonary/Chest: Effort normal and breath sounds normal. No respiratory distress.  Abdominal: Soft. She exhibits no distension. There is no tenderness.  Musculoskeletal: She exhibits no edema and no tenderness.  Neurological: She is alert and oriented to person, place, and time. No cranial nerve deficit. She exhibits normal muscle tone. Coordination normal.  Skin: Skin is warm and dry.  Psychiatric: She has a normal mood and  affect. Her behavior is normal. Thought content normal.    ED Course  Procedures (including critical care time) Labs Review Labs Reviewed - No data to display  Imaging Review No results found.   EKG Interpretation None      MDM   Final diagnoses:  Acute otitis media of left ear with perforation    43 year old female with headache. Suspect from earache. Exam significant for an otitis media the left ear and she does appear to have a perforation. Plan pain control and antibiotics. Return precautions were discussed. ENT followup.    Raeford RazorStephen Dagoberto Nealy, MD 11/27/13 [id number]

## 2013-12-29 ENCOUNTER — Emergency Department (HOSPITAL_BASED_OUTPATIENT_CLINIC_OR_DEPARTMENT_OTHER)
Admission: EM | Admit: 2013-12-29 | Discharge: 2013-12-29 | Disposition: A | Discharge status: Home / Self Care | Payer: Managed Care, Other (non HMO) | Attending: Emergency Medicine | Admitting: Emergency Medicine

## 2013-12-29 ENCOUNTER — Encounter (HOSPITAL_BASED_OUTPATIENT_CLINIC_OR_DEPARTMENT_OTHER): Payer: Self-pay | Admitting: Emergency Medicine

## 2013-12-29 DIAGNOSIS — IMO0002 Reserved for concepts with insufficient information to code with codable children: Secondary | ICD-10-CM | POA: Insufficient documentation

## 2013-12-29 DIAGNOSIS — Z79899 Other long term (current) drug therapy: Secondary | ICD-10-CM | POA: Insufficient documentation

## 2013-12-29 DIAGNOSIS — S199XXA Unspecified injury of neck, initial encounter: Principal | ICD-10-CM

## 2013-12-29 DIAGNOSIS — G8929 Other chronic pain: Secondary | ICD-10-CM | POA: Insufficient documentation

## 2013-12-29 DIAGNOSIS — Z862 Personal history of diseases of the blood and blood-forming organs and certain disorders involving the immune mechanism: Secondary | ICD-10-CM | POA: Insufficient documentation

## 2013-12-29 DIAGNOSIS — Y9289 Other specified places as the place of occurrence of the external cause: Secondary | ICD-10-CM | POA: Insufficient documentation

## 2013-12-29 DIAGNOSIS — K219 Gastro-esophageal reflux disease without esophagitis: Secondary | ICD-10-CM | POA: Insufficient documentation

## 2013-12-29 DIAGNOSIS — J45909 Unspecified asthma, uncomplicated: Secondary | ICD-10-CM | POA: Insufficient documentation

## 2013-12-29 DIAGNOSIS — F17200 Nicotine dependence, unspecified, uncomplicated: Secondary | ICD-10-CM | POA: Insufficient documentation

## 2013-12-29 DIAGNOSIS — S0993XA Unspecified injury of face, initial encounter: Secondary | ICD-10-CM | POA: Insufficient documentation

## 2013-12-29 DIAGNOSIS — Y9389 Activity, other specified: Secondary | ICD-10-CM | POA: Insufficient documentation

## 2013-12-29 DIAGNOSIS — G43909 Migraine, unspecified, not intractable, without status migrainosus: Secondary | ICD-10-CM | POA: Insufficient documentation

## 2013-12-29 MED ORDER — ACETAMINOPHEN 325 MG PO TABS
650.0000 mg | ORAL_TABLET | Freq: Once | ORAL | Status: AC
  Administered 2013-12-29: 650 mg via ORAL

## 2013-12-29 MED ORDER — ACETAMINOPHEN 325 MG PO TABS: 650.0000 mg | ORAL_TABLET | Freq: Four times a day (QID) | ORAL | Status: DC | PRN

## 2013-12-29 MED ORDER — ACETAMINOPHEN 325 MG PO TABS
ORAL_TABLET | ORAL | Status: AC
  Filled 2013-12-29: qty 2

## 2013-12-29 MED ORDER — IBUPROFEN 200 MG PO TABS: 600.0000 mg | ORAL_TABLET | Freq: Four times a day (QID) | ORAL | Status: DC | PRN

## 2013-12-29 NOTE — ED Notes (Signed)
Pt was moving a dresser and it slipped and hit her in the left cheek.  No LOC.

## 2013-12-29 NOTE — ED Provider Notes (Signed)
CSN: [id number]     Arrival date & time 12/29/13  1118 History   First MD Initiated Contact with Patient 12/29/13 1147     Chief Complaint  Patient presents with  . Facial Injury     (Consider location/radiation/quality/duration/timing/severity/associated sxs/prior Treatment) HPI Jenny Evans is a 43 y.o. female that presents to the ED with injury to left face. This morning around 10:00AM, she was helping her mom move. As they were moving a dresser up the stairs, she missed a step and the dresser hit her left cheek. She has associated throbbing/burning pain. She took [facility] powder which did not help much. There was also associated swelling for which she used a cold rag. Swelling and pain have since improved. No injury to her eye and no vision changes. No break in skin  Past Medical History  Diagnosis Date  . Migraine   . GERD (gastroesophageal reflux disease)   . Ulcer   . Toothache   . Chronic pain   . Bronchitis   . Sickle cell trait   . Asthma    Past Surgical History  Procedure Laterality Date  . Myringotomy      age 77  . Esophagogastroduodenoscopy (egd) with propofol N/A 04/04/2013    Procedure: ESOPHAGOGASTRODUODENOSCOPY (EGD) WITH PROPOFOL;  Surgeon: Willis Modena, MD;  Location: [facility];  Service: Endoscopy;  Laterality: N/A;  . Examination under anesthesia N/A 08/10/2013    Procedure: RECTAL EXAM UNDER ANESTHESIA;  Surgeon: Clovis Pu. Cornett, MD;  Location: [facility];  Service: General;  Laterality: N/A;  ligation   Family History  Problem Relation Age of Onset  . Heart disease Mother   . Kidney disease Mother   . Hypertension Mother   . COPD Mother   . Diabetes Mother   . Heart disease Father    History  Substance Use Topics  . Smoking status: Current Every Day Smoker -- 0.50 packs/day for 15 years    Types: Cigarettes  . Smokeless tobacco: Never Used  . Alcohol Use: Yes     Comment: very rare   OB History   Grav Para Term Preterm  Abortions TAB SAB Ect Mult Living                 Review of Systems  Constitutional: Negative.   Eyes: Negative for pain, redness and visual disturbance.  Neurological: Negative for dizziness, syncope, light-headedness and headaches.  All other systems reviewed and are negative.     Allergies  Hydrocodone-acetaminophen  Home Medications   Prior to Admission medications   Medication Sig Start Date End Date Taking? Authorizing Provider  acetaminophen (TYLENOL) 325 MG tablet Take 2 tablets (650 mg total) by mouth every 6 (six) hours as needed. 12/29/13   Jacquelin Hawking, MD  esomeprazole (NEXIUM) 40 MG capsule Take 1 capsule (40 mg total) by mouth 2 (two) times daily before a meal. 04/04/13   Willis Modena, MD  ibuprofen (ADVIL) 200 MG tablet Take 3 tablets (600 mg total) by mouth every 6 (six) hours as needed. 12/29/13   Jacquelin Hawking, MD  SUMAtriptan (IMITREX) 6 MG/0.5ML SOLN injection Inject 6 mg into the skin every 2 (two) hours as needed for migraine or headache. May repeat in 2 hours if headache persists or recurs.    Historical Provider, MD   BP 109/73  Pulse 83  Temp(Src) 97.9 F (36.6 C) (Oral)  Resp 15  SpO2 100%  LMP 12/15/2013  Physical Exam  Constitutional: She is oriented to  person, place, and time. She appears well-developed and well-nourished.  HENT:  Head: Normocephalic. Head is without laceration.  Right Ear: Tympanic membrane normal.  Left Ear: No drainage (Ear canal looked dry with dry cerumen.) or swelling. Tympanic membrane is not injected (Could not visualize whole TM. Did not notice rupture from area of TM visualized.).  No middle ear effusion.  Swelling and tenderness over left maxillary bone. No crepitus noted.  Eyes: Conjunctivae and EOM are normal.  Neck: Normal range of motion.  Pulmonary/Chest: Effort normal.  Neurological: She is alert and oriented to person, place, and time.  Skin: Skin is warm, dry and intact.    ED Course  Procedures  (including critical care time) Labs Review Labs Reviewed - No data to display  Imaging Review No results found.   EKG Interpretation None      MDM   Final diagnoses:  Facial trauma, initial encounter    Patient with facial bruise. Cannot rule out fracture but appears unlikely. No significant swelling around orbit. No compromised vision. Symptoms improving. Patient more concerned with being able to go to work as a reason for being checked. Discussed return precautions for CT head if symptoms worsened. Patient understood and agreed with plan. Patient stable for discharge home.    Jacquelin Hawkingalph Nettey, MD 12/29/13 1537

## 2013-12-29 NOTE — Discharge Instructions (Signed)
We evaluated your facial injury. It appears the swelling and pain is improving. We discussed diagnostic options and decided to hold further advanced imaging for now. If you worsen in 4-5 days, please return to have a CT scan of your face to evaluate for you fracture. Regarding your ear, please follow-up with an Otolaryngologist or your primary care physician.  Blunt Trauma You have been evaluated for injuries. You have been examined and your caregiver has not found injuries serious enough to require hospitalization. It is common to have multiple bruises and sore muscles following an accident. These tend to feel worse for the first 24 hours. You will feel more stiffness and soreness over the next several hours and worse when you wake up the first morning after your accident. After this point, you should begin to improve with each passing day. The amount of improvement depends on the amount of damage done in the accident. Following your accident, if some part of your body does not work as it should, or if the pain in any area continues to increase, you should return to the Emergency Department for re-evaluation.  HOME CARE INSTRUCTIONS  Routine care for sore areas should include:  Ice to sore areas every 2 hours for 20 minutes while awake for the next 2 days.  Drink extra fluids (not alcohol).  Take a hot or warm shower or bath once or twice a day to increase blood flow to sore muscles. This will help you "limber up".  Activity as tolerated. Lifting may aggravate neck or back pain.  Only take over-the-counter or prescription medicines for pain, discomfort, or fever as directed by your caregiver. Do not use aspirin. This may increase bruising or increase bleeding if there are small areas where this is happening. SEEK IMMEDIATE MEDICAL CARE IF:  Numbness, tingling, weakness, or problem with the use of your arms or legs.  A severe headache is not relieved with medications.  There is a change in  bowel or bladder control.  Increasing pain in any areas of the body.  Short of breath or dizzy.  Nauseated, vomiting, or sweating.  Increasing belly (abdominal) discomfort.  Blood in urine, stool, or vomiting blood.  Pain in either shoulder in an area where a shoulder strap would be.  Feelings of lightheadedness or if you have a fainting episode. Sometimes it is not possible to identify all injuries immediately after the trauma. It is important that you continue to monitor your condition after the emergency department visit. If you feel you are not improving, or improving more slowly than should be expected, call your physician. If you feel your symptoms (problems) are worsening, return to the Emergency Department immediately. Document Released: 02/26/2001 Document Revised: 08/25/2011 Document Reviewed: 01/19/2008 [facility] Patient Information 2015 [place], [place]. This information is not intended to replace advice given to you by your health care provider. Make sure you discuss any questions you have with your health care provider.

## 2013-12-30 NOTE — ED Provider Notes (Signed)
Medical screening examination/treatment/procedure(s) were conducted as a shared visit with non-physician practitioner(s) or resident and myself. I personally evaluated the patient during the encounter and agree with the findings.  I have personally reviewed any xrays and/ or EKG's with the provider and I agree with interpretation.  Patient with left facial pain since stress or slipped and hit her in the left upper cheek. Patient has tenderness to palpation no step-off, mild swelling, cranial nerves intact and no other injuries, well-appearing. Patient did not lose consciousness is not on blood thinners. Discussed CT scan in ED versus supportive care and followup if no improvement in 4-5 days. Patient okay with holding on CT scan with risks of cost/radiation and discussed soft diet and followup.  Left facial pain/contusion   Enid SkeensJoshua M Maikayla Beggs, MD 12/30/13 [phone]

## 2014-01-16 ENCOUNTER — Emergency Department (HOSPITAL_BASED_OUTPATIENT_CLINIC_OR_DEPARTMENT_OTHER): Payer: Managed Care, Other (non HMO)

## 2014-01-16 ENCOUNTER — Encounter (HOSPITAL_BASED_OUTPATIENT_CLINIC_OR_DEPARTMENT_OTHER): Payer: Self-pay | Admitting: Emergency Medicine

## 2014-01-16 ENCOUNTER — Emergency Department (HOSPITAL_BASED_OUTPATIENT_CLINIC_OR_DEPARTMENT_OTHER)
Admission: EM | Admit: 2014-01-16 | Discharge: 2014-01-16 | Disposition: A | Discharge status: Home / Self Care | Payer: Managed Care, Other (non HMO) | Attending: Emergency Medicine | Admitting: Emergency Medicine

## 2014-01-16 DIAGNOSIS — K219 Gastro-esophageal reflux disease without esophagitis: Secondary | ICD-10-CM | POA: Insufficient documentation

## 2014-01-16 DIAGNOSIS — Z862 Personal history of diseases of the blood and blood-forming organs and certain disorders involving the immune mechanism: Secondary | ICD-10-CM | POA: Insufficient documentation

## 2014-01-16 DIAGNOSIS — G43909 Migraine, unspecified, not intractable, without status migrainosus: Secondary | ICD-10-CM | POA: Insufficient documentation

## 2014-01-16 DIAGNOSIS — R112 Nausea with vomiting, unspecified: Secondary | ICD-10-CM | POA: Insufficient documentation

## 2014-01-16 DIAGNOSIS — J45909 Unspecified asthma, uncomplicated: Secondary | ICD-10-CM | POA: Insufficient documentation

## 2014-01-16 DIAGNOSIS — F17200 Nicotine dependence, unspecified, uncomplicated: Secondary | ICD-10-CM | POA: Insufficient documentation

## 2014-01-16 DIAGNOSIS — Z79899 Other long term (current) drug therapy: Secondary | ICD-10-CM | POA: Insufficient documentation

## 2014-01-16 DIAGNOSIS — Z3202 Encounter for pregnancy test, result negative: Secondary | ICD-10-CM | POA: Insufficient documentation

## 2014-01-16 DIAGNOSIS — R109 Unspecified abdominal pain: Secondary | ICD-10-CM

## 2014-01-16 DIAGNOSIS — R1084 Generalized abdominal pain: Secondary | ICD-10-CM | POA: Insufficient documentation

## 2014-01-16 DIAGNOSIS — G8929 Other chronic pain: Secondary | ICD-10-CM | POA: Insufficient documentation

## 2014-01-16 DIAGNOSIS — Z872 Personal history of diseases of the skin and subcutaneous tissue: Secondary | ICD-10-CM | POA: Insufficient documentation

## 2014-01-16 LAB — CBC WITH DIFFERENTIAL/PLATELET
BASOS ABS: 0 10*3/uL (ref 0.0–0.1)
Basophils Relative: 0 % (ref 0–1)
Eosinophils Absolute: 0.1 10*3/uL (ref 0.0–0.7)
Eosinophils Relative: 2 % (ref 0–5)
HCT: 31.6 % — ABNORMAL LOW (ref 36.0–46.0)
Hemoglobin: 11.2 g/dL — ABNORMAL LOW (ref 12.0–15.0)
Lymphocytes Relative: 38 % (ref 12–46)
Lymphs Abs: 2.4 10*3/uL (ref 0.7–4.0)
MCH: 29.9 pg (ref 26.0–34.0)
MCHC: 35.4 g/dL (ref 30.0–36.0)
MCV: 84.3 fL (ref 78.0–100.0)
Monocytes Absolute: 0.5 10*3/uL (ref 0.1–1.0)
Monocytes Relative: 8 % (ref 3–12)
NEUTROS ABS: 3.2 10*3/uL (ref 1.7–7.7)
Neutrophils Relative %: 52 % (ref 43–77)
PLATELETS: 391 10*3/uL (ref 150–400)
RBC: 3.75 MIL/uL — AB (ref 3.87–5.11)
RDW: 14.4 % (ref 11.5–15.5)
WBC: 6.2 10*3/uL (ref 4.0–10.5)

## 2014-01-16 LAB — URINALYSIS, ROUTINE W REFLEX MICROSCOPIC
Bilirubin Urine: NEGATIVE
Glucose, UA: NEGATIVE mg/dL
HGB URINE DIPSTICK: NEGATIVE
Ketones, ur: NEGATIVE mg/dL
Leukocytes, UA: NEGATIVE
NITRITE: NEGATIVE
Protein, ur: NEGATIVE mg/dL
SPECIFIC GRAVITY, URINE: 1.016 (ref 1.005–1.030)
UROBILINOGEN UA: 1 mg/dL (ref 0.0–1.0)
pH: 5.5 (ref 5.0–8.0)

## 2014-01-16 LAB — COMPREHENSIVE METABOLIC PANEL
ALK PHOS: 65 U/L (ref 39–117)
ALT: 9 U/L (ref 0–35)
AST: 17 U/L (ref 0–37)
Albumin: 3.9 g/dL (ref 3.5–5.2)
Anion gap: 13 (ref 5–15)
BUN: 7 mg/dL (ref 6–23)
CHLORIDE: 104 meq/L (ref 96–112)
CO2: 23 mEq/L (ref 19–32)
Calcium: 9.3 mg/dL (ref 8.4–10.5)
Creatinine, Ser: 0.6 mg/dL (ref 0.50–1.10)
GLUCOSE: 102 mg/dL — AB (ref 70–99)
POTASSIUM: 3.5 meq/L — AB (ref 3.7–5.3)
SODIUM: 140 meq/L (ref 137–147)
Total Protein: 7 g/dL (ref 6.0–8.3)

## 2014-01-16 LAB — PREGNANCY, URINE: Preg Test, Ur: NEGATIVE

## 2014-01-16 LAB — OCCULT BLOOD X 1 CARD TO LAB, STOOL: FECAL OCCULT BLD: NEGATIVE

## 2014-01-16 LAB — LIPASE, BLOOD: Lipase: 29 U/L (ref 11–59)

## 2014-01-16 MED ORDER — SUCRALFATE 1 G PO TABS: 1.0000 g | ORAL_TABLET | Freq: Three times a day (TID) | ORAL | Status: DC

## 2014-01-16 MED ORDER — ONDANSETRON HCL 4 MG PO TABS: 4.0000 mg | ORAL_TABLET | Freq: Four times a day (QID) | ORAL | Status: DC

## 2014-01-16 MED ORDER — TRAMADOL HCL 50 MG PO TABS: 50.0000 mg | ORAL_TABLET | Freq: Four times a day (QID) | ORAL | Status: DC | PRN

## 2014-01-16 MED ORDER — GI COCKTAIL ~~LOC~~
30.0000 mL | Freq: Once | ORAL | Status: AC
  Administered 2014-01-16: 30 mL via ORAL
  Filled 2014-01-16: qty 30

## 2014-01-16 MED ORDER — PANTOPRAZOLE SODIUM 40 MG PO TBEC
40.0000 mg | DELAYED_RELEASE_TABLET | Freq: Once | ORAL | Status: AC
  Administered 2014-01-16: 40 mg via ORAL
  Filled 2014-01-16: qty 1

## 2014-01-16 NOTE — Discharge Instructions (Signed)
Please continue your Nexium every day. Please avoid NSAIDs such as ibuprofen, aspirin, Aleve, Motrin, Advil, goody powders. Please followup with your gastrointestinal your symptoms continue. Your labs today were normal and there is no sign of blood in your stool.   Abdominal Pain, Women Abdominal (stomach, pelvic, or belly) pain can be caused by many things. It is important to tell your doctor:  The location of the pain.  Does it come and go or is it present all the time?  Are there things that start the pain (eating certain foods, exercise)?  Are there other symptoms associated with the pain (fever, nausea, vomiting, diarrhea)? All of this is helpful to know when trying to find the cause of the pain. CAUSES   Stomach: virus or bacteria infection, or ulcer.  Intestine: appendicitis (inflamed appendix), regional ileitis (Crohn's disease), ulcerative colitis (inflamed colon), irritable bowel syndrome, diverticulitis (inflamed diverticulum of the colon), or cancer of the stomach or intestine.  Gallbladder disease or stones in the gallbladder.  Kidney disease, kidney stones, or infection.  Pancreas infection or cancer.  Fibromyalgia (pain disorder).  Diseases of the female organs:  Uterus: fibroid (non-cancerous) tumors or infection.  Fallopian tubes: infection or tubal pregnancy.  Ovary: cysts or tumors.  Pelvic adhesions (scar tissue).  Endometriosis (uterus lining tissue growing in the pelvis and on the pelvic organs).  Pelvic congestion syndrome (female organs filling up with blood just before the menstrual period).  Pain with the menstrual period.  Pain with ovulation (producing an egg).  Pain with an IUD (intrauterine device, birth control) in the uterus.  Cancer of the female organs.  Functional pain (pain not caused by a disease, may improve without treatment).  Psychological pain.  Depression. DIAGNOSIS  Your doctor will decide the seriousness of your pain  by doing an examination.  Blood tests.  X-rays.  Ultrasound.  CT scan (computed tomography, special type of X-ray).  MRI (magnetic resonance imaging).  Cultures, for infection.  Barium enema (dye inserted in the large intestine, to better view it with X-rays).  Colonoscopy (looking in intestine with a lighted tube).  Laparoscopy (minor surgery, looking in abdomen with a lighted tube).  Major abdominal exploratory surgery (looking in abdomen with a large incision). TREATMENT  The treatment will depend on the cause of the pain.   Many cases can be observed and treated at home.  Over-the-counter medicines recommended by your caregiver.  Prescription medicine.  Antibiotics, for infection.  Birth control pills, for painful periods or for ovulation pain.  Hormone treatment, for endometriosis.  Nerve blocking injections.  Physical therapy.  Antidepressants.  Counseling with a psychologist or psychiatrist.  Minor or major surgery. HOME CARE INSTRUCTIONS   Do not take laxatives, unless directed by your caregiver.  Take over-the-counter pain medicine only if ordered by your caregiver. Do not take aspirin because it can cause an upset stomach or bleeding.  Try a clear liquid diet (broth or water) as ordered by your caregiver. Slowly move to a bland diet, as tolerated, if the pain is related to the stomach or intestine.  Have a thermometer and take your temperature several times a day, and record it.  Bed rest and sleep, if it helps the pain.  Avoid sexual intercourse, if it causes pain.  Avoid stressful situations.  Keep your follow-up appointments and tests, as your caregiver orders.  If the pain does not go away with medicine or surgery, you may try:  Acupuncture.  Relaxation exercises (yoga, meditation).  Group  therapy.  Counseling. SEEK MEDICAL CARE IF:   You notice certain foods cause stomach pain.  Your home care treatment is not helping your  pain.  You need stronger pain medicine.  You want your IUD removed.  You feel faint or lightheaded.  You develop nausea and vomiting.  You develop a rash.  You are having side effects or an allergy to your medicine. SEEK IMMEDIATE MEDICAL CARE IF:   Your pain does not go away or gets worse.  You have a fever.  Your pain is felt only in portions of the abdomen. The right side could possibly be appendicitis. The left lower portion of the abdomen could be colitis or diverticulitis.  You are passing blood in your stools (bright red or black tarry stools, with or without vomiting).  You have blood in your urine.  You develop chills, with or without a fever.  You pass out. MAKE SURE YOU:   Understand these instructions.  Will watch your condition.  Will get help right away if you are not doing well or get worse. Document Released: 03/30/2007 Document Revised: 10/17/2013 Document Reviewed: 04/19/2009 [facility] Patient Information 2015 [place], [place]. This information is not intended to replace advice given to you by your health care provider. Make sure you discuss any questions you have with your health care provider.   Peptic Ulcer A peptic ulcer is a sore in the lining of your esophagus (esophageal ulcer), stomach (gastric ulcer), or in the first part of your small intestine (duodenal ulcer). The ulcer causes erosion into the deeper tissue. CAUSES  Normally, the lining of the stomach and the small intestine protects itself from the acid that digests food. The protective lining can be damaged by:  An infection caused by a bacterium called Helicobacter pylori (H. pylori).  Regular use of nonsteroidal anti-inflammatory drugs (NSAIDs), such as ibuprofen or aspirin.  Smoking tobacco. Other risk factors include being older than 50, drinking alcohol excessively, and having a family history of ulcer disease.  SYMPTOMS   Burning pain or gnawing in the area between the chest and  the belly button.  Heartburn.  Nausea and vomiting.  Bloating. The pain can be worse on an empty stomach and at night. If the ulcer results in bleeding, it can cause:  Black, tarry stools.  Vomiting of bright red blood.  Vomiting of coffee-ground-looking materials. DIAGNOSIS  A diagnosis is usually made based upon your history and an exam. Other tests and procedures may be performed to find the cause of the ulcer. Finding a cause will help determine the best treatment. Tests and procedures may include:  Blood tests, stool tests, or breath tests to check for the bacterium H. pylori.  An upper gastrointestinal (GI) series of the esophagus, stomach, and small intestine.  An endoscopy to examine the esophagus, stomach, and small intestine.  A biopsy. TREATMENT  Treatment may include:  Eliminating the cause of the ulcer, such as smoking, NSAIDs, or alcohol.  Medicines to reduce the amount of acid in your digestive tract.  Antibiotic medicines if the ulcer is caused by the H. pylori bacterium.  An upper endoscopy to treat a bleeding ulcer.  Surgery if the bleeding is severe or if the ulcer created a hole somewhere in the digestive system. HOME CARE INSTRUCTIONS   Avoid tobacco, alcohol, and caffeine. Smoking can increase the acid in the stomach, and continued smoking will impair the healing of ulcers.  Avoid foods and drinks that seem to cause discomfort or aggravate  your ulcer.  Only take medicines as directed by your caregiver. Do not substitute over-the-counter medicines for prescription medicines without talking to your caregiver.  Keep any follow-up appointments and tests as directed. SEEK MEDICAL CARE IF:   Your do not improve within 7 days of starting treatment.  You have ongoing indigestion or heartburn. SEEK IMMEDIATE MEDICAL CARE IF:   You have sudden, sharp, or persistent abdominal pain.  You have bloody or dark black, tarry stools.  You vomit blood or  vomit that looks like coffee grounds.  You become light-headed, weak, or feel faint.  You become sweaty or clammy. MAKE SURE YOU:   Understand these instructions.  Will watch your condition.  Will get help right away if you are not doing well or get worse. Document Released: 05/30/2000 Document Revised: 10/17/2013 Document Reviewed: 12/31/2011 [facility] Patient Information 2015 [place], [place]. This information is not intended to replace advice given to you by your health care provider. Make sure you discuss any questions you have with your health care provider.

## 2014-01-16 NOTE — ED Provider Notes (Signed)
TIME SEEN: 3:25 PM  CHIEF COMPLAINT: Abdominal pain, black stools  HPI: Patient is a 43 year old female with history of migraine headaches, asthma, GERD and prior gastric ulcers who presents to the emergency department with complaint of abdominal pain since Thursday, 4 days ago. She describes it as a sharp pain that is worse after eating. She does have nausea and vomiting after eating. She has had some black, tarry stools it started yesterday and has had 2 total movements that look like this. No bloody stools. No hematemesis. She denies any NSAID use, heavy alcohol use. She is not on anticoagulation. She states she is on Nexium daily. Her last endoscopy was October 2014 by Dr. Dulce Sellar with [facility] gastroenterology.  ROS: See HPI Constitutional: no fever  Eyes: no drainage  ENT: no runny nose   Cardiovascular:  no chest pain  Resp: no SOB  GI:  vomiting GU: no dysuria or hematuria Integumentary: no rash  Allergy: no hives  Musculoskeletal: no leg swelling  Neurological: no slurred speech ROS otherwise negative  PAST MEDICAL HISTORY/PAST SURGICAL HISTORY:  Past Medical History  Diagnosis Date  . Migraine   . GERD (gastroesophageal reflux disease)   . Ulcer   . Toothache   . Chronic pain   . Bronchitis   . Sickle cell trait   . Asthma     MEDICATIONS:  Prior to Admission medications   Medication Sig Start Date End Date Taking? Authorizing Provider  acetaminophen (TYLENOL) 325 MG tablet Take 2 tablets (650 mg total) by mouth every 6 (six) hours as needed. 12/29/13   Jacquelin Hawking, MD  esomeprazole (NEXIUM) 40 MG capsule Take 1 capsule (40 mg total) by mouth 2 (two) times daily before a meal. 04/04/13   Willis Modena, MD  ibuprofen (ADVIL) 200 MG tablet Take 3 tablets (600 mg total) by mouth every 6 (six) hours as needed. 12/29/13   Jacquelin Hawking, MD  SUMAtriptan (IMITREX) 6 MG/0.5ML SOLN injection Inject 6 mg into the skin every 2 (two) hours as needed for migraine or headache. May  repeat in 2 hours if headache persists or recurs.    Historical Provider, MD    ALLERGIES:  Allergies  Allergen Reactions  . Hydrocodone-Acetaminophen Nausea And Vomiting    SOCIAL HISTORY:  History  Substance Use Topics  . Smoking status: Current Every Day Smoker -- 0.50 packs/day for 15 years    Types: Cigarettes  . Smokeless tobacco: Never Used  . Alcohol Use: Yes     Comment: very rare    FAMILY HISTORY: Family History  Problem Relation Age of Onset  . Heart disease Mother   . Kidney disease Mother   . Hypertension Mother   . COPD Mother   . Diabetes Mother   . Heart disease Father     EXAM: BP 120/76  Pulse 56  Temp(Src) 98 F (36.7 C) (Oral)  Resp 16  Ht 5\' 4"  (1.626 m)  Wt 124 lb (56.246 kg)  BMI 21.27 kg/m2  SpO2 99%  LMP 12/15/2013 CONSTITUTIONAL: Alert and oriented and responds appropriately to questions. Well-appearing; well-nourished HEAD: Normocephalic EYES: Conjunctivae clear, PERRL ENT: normal nose; no rhinorrhea; moist mucous membranes; pharynx without lesions noted NECK: Supple, no meningismus, no LAD  CARD: RRR; S1 and S2 appreciated; no murmurs, no clicks, no rubs, no gallops RESP: Normal chest excursion without splinting or tachypnea; breath sounds clear and equal bilaterally; no wheezes, no rhonchi, no rales,  ABD/GI: Normal bowel sounds; non-distended; soft, diffusely tender to palpation without  guarding or rebound, no peritoneal signs, negative Murphy sign, no tenderness at McBurney's point RECTAL:  Patient has dark brown stool in the rectal vault, no melena or gross blood present, normal rectal tone, no hemorrhoids BACK:  The back appears normal and is non-tender to palpation, there is no CVA tenderness EXT: Normal ROM in all joints; non-tender to palpation; no edema; normal capillary refill; no cyanosis    SKIN: Normal color for age and race; warm NEURO: Moves all extremities equally PSYCH: The patient's mood and manner are appropriate.  Grooming and personal hygiene are appropriate.  MEDICAL DECISION MAKING: Patient here with diffuse abdominal pain and reports of melena that she states feels exactly like her prior ulcers. She does have diffuse tenderness on exam but is not peritoneal. Will obtain labs, Hemoccult, abdominal x-ray. We'll give GI cocktail, protonic and reassess.  ED PROGRESS: Patient's labs are unremarkable. Her hemoglobin is at her baseline at 11.2. Abdominal labs are unremarkable. Urine shows no sign of infection. Urine pregnancy test negative. Her Hemoccult was negative. Discussed with patient I feel she is safe to be discharged home. She states she has plenty of Nexium to continue. We'll discharge with prescription for tramadol and Zofran. Have discussed with her that she needs to followup with her primary care physician and gastroenterologist. Discussed supportive care instructions and return precautions. She verbalized understanding and is comfortable with plan.    I personally performed the services described in this documentation, which was scribed in my presence. The recorded information has been reviewed and is accurate.    Layla MawKristen N Emilyrose Darrah, DO 01/16/14 1650

## 2014-01-16 NOTE — ED Notes (Signed)
Abdominal pain for almost a week. Hx of ulcers. Black stools. No relief with Nexium.

## 2014-03-07 ENCOUNTER — Emergency Department (HOSPITAL_COMMUNITY): Payer: Managed Care, Other (non HMO)

## 2014-03-07 ENCOUNTER — Emergency Department (HOSPITAL_COMMUNITY)
Admission: EM | Admit: 2014-03-07 | Discharge: 2014-03-07 | Disposition: A | Discharge status: Home / Self Care | Payer: Managed Care, Other (non HMO) | Attending: Emergency Medicine | Admitting: Emergency Medicine

## 2014-03-07 ENCOUNTER — Encounter (HOSPITAL_COMMUNITY): Payer: Self-pay | Admitting: Emergency Medicine

## 2014-03-07 DIAGNOSIS — Y9289 Other specified places as the place of occurrence of the external cause: Secondary | ICD-10-CM | POA: Insufficient documentation

## 2014-03-07 DIAGNOSIS — G8929 Other chronic pain: Secondary | ICD-10-CM | POA: Insufficient documentation

## 2014-03-07 DIAGNOSIS — Z8719 Personal history of other diseases of the digestive system: Secondary | ICD-10-CM | POA: Insufficient documentation

## 2014-03-07 DIAGNOSIS — Z79899 Other long term (current) drug therapy: Secondary | ICD-10-CM | POA: Insufficient documentation

## 2014-03-07 DIAGNOSIS — Y9389 Activity, other specified: Secondary | ICD-10-CM | POA: Insufficient documentation

## 2014-03-07 DIAGNOSIS — F17200 Nicotine dependence, unspecified, uncomplicated: Secondary | ICD-10-CM | POA: Insufficient documentation

## 2014-03-07 DIAGNOSIS — G43909 Migraine, unspecified, not intractable, without status migrainosus: Secondary | ICD-10-CM | POA: Insufficient documentation

## 2014-03-07 DIAGNOSIS — J45909 Unspecified asthma, uncomplicated: Secondary | ICD-10-CM | POA: Insufficient documentation

## 2014-03-07 DIAGNOSIS — R296 Repeated falls: Secondary | ICD-10-CM | POA: Insufficient documentation

## 2014-03-07 DIAGNOSIS — Z872 Personal history of diseases of the skin and subcutaneous tissue: Secondary | ICD-10-CM | POA: Insufficient documentation

## 2014-03-07 DIAGNOSIS — Z8709 Personal history of other diseases of the respiratory system: Secondary | ICD-10-CM | POA: Insufficient documentation

## 2014-03-07 DIAGNOSIS — S8990XA Unspecified injury of unspecified lower leg, initial encounter: Secondary | ICD-10-CM | POA: Insufficient documentation

## 2014-03-07 DIAGNOSIS — M25562 Pain in left knee: Secondary | ICD-10-CM

## 2014-03-07 DIAGNOSIS — S99929A Unspecified injury of unspecified foot, initial encounter: Principal | ICD-10-CM

## 2014-03-07 DIAGNOSIS — S99919A Unspecified injury of unspecified ankle, initial encounter: Principal | ICD-10-CM

## 2014-03-07 DIAGNOSIS — Z862 Personal history of diseases of the blood and blood-forming organs and certain disorders involving the immune mechanism: Secondary | ICD-10-CM | POA: Insufficient documentation

## 2014-03-07 MED ORDER — OXYCODONE-ACETAMINOPHEN 5-325 MG PO TABS: 1.0000 | ORAL_TABLET | Freq: Four times a day (QID) | ORAL | Status: DC | PRN

## 2014-03-07 MED ORDER — ONDANSETRON 8 MG PO TBDP
8.0000 mg | ORAL_TABLET | Freq: Once | ORAL | Status: AC
  Administered 2014-03-07: 8 mg via ORAL
  Filled 2014-03-07: qty 1

## 2014-03-07 MED ORDER — OXYCODONE-ACETAMINOPHEN 5-325 MG PO TABS
2.0000 | ORAL_TABLET | Freq: Once | ORAL | Status: AC
  Administered 2014-03-07: 2 via ORAL
  Filled 2014-03-07: qty 2

## 2014-03-07 NOTE — Discharge Instructions (Signed)

## 2014-03-07 NOTE — ED Provider Notes (Signed)
CSN: [id number]     Arrival date & time 03/07/14  1702 History  This chart was scribed for non-physician practitioner, Junious Silk, PA-C working with Rolland Porter, MD by Greggory Stallion, ED scribe. This patient was seen in room WTR7/WTR7 and the patient's care was started at 5:48 PM.   Chief Complaint  Patient presents with  . Leg Pain   The history is provided by the patient. No language interpreter was used.   HPI Comments: Jenny Evans is a 43 y.o. female who presents to the Emergency Department complaining of worsening burning left knee pain that started 2 days ago. She thinks she might have fallen off of a porch 3 days ago but can't remember. She reports this was because she was drinking alcohol. She denies any other injuries or pain elsewhere. Bending her knee and bearing weight worsen the pain and cause it to be throbbing. Pt has take BC back and body and motrin with no relief. She denies headache, dizziness, lightheadedness, chest pain, shortness of breath.  Past Medical History  Diagnosis Date  . Migraine   . GERD (gastroesophageal reflux disease)   . Ulcer   . Toothache   . Chronic pain   . Bronchitis   . Sickle cell trait   . Asthma    Past Surgical History  Procedure Laterality Date  . Myringotomy      age 41  . Esophagogastroduodenoscopy (egd) with propofol N/A 04/04/2013    Procedure: ESOPHAGOGASTRODUODENOSCOPY (EGD) WITH PROPOFOL;  Surgeon: Willis Modena, MD;  Location: [facility];  Service: Endoscopy;  Laterality: N/A;  . Examination under anesthesia N/A 08/10/2013    Procedure: RECTAL EXAM UNDER ANESTHESIA;  Surgeon: Clovis Pu. Cornett, MD;  Location: [facility];  Service: General;  Laterality: N/A;  ligation   Family History  Problem Relation Age of Onset  . Heart disease Mother   . Kidney disease Mother   . Hypertension Mother   . COPD Mother   . Diabetes Mother   . Heart disease Father    History  Substance Use Topics  . Smoking  status: Current Every Day Smoker -- 0.50 packs/day for 15 years    Types: Cigarettes  . Smokeless tobacco: Never Used  . Alcohol Use: Yes     Comment: very rare   OB History   Grav Para Term Preterm Abortions TAB SAB Ect Mult Living                 Review of Systems  Musculoskeletal: Positive for myalgias.  All other systems reviewed and are negative.  Allergies  Hydrocodone-acetaminophen  Home Medications   Prior to Admission medications   Medication Sig Start Date End Date Taking? Authorizing Provider  acetaminophen (TYLENOL) 325 MG tablet Take 2 tablets (650 mg total) by mouth every 6 (six) hours as needed. 12/29/13   Jacquelin Hawking, MD  esomeprazole (NEXIUM) 40 MG capsule Take 1 capsule (40 mg total) by mouth 2 (two) times daily before a meal. 04/04/13   Willis Modena, MD  ibuprofen (ADVIL) 200 MG tablet Take 3 tablets (600 mg total) by mouth every 6 (six) hours as needed. 12/29/13   Jacquelin Hawking, MD  ondansetron (ZOFRAN) 4 MG tablet Take 1 tablet (4 mg total) by mouth every 6 (six) hours. 01/16/14   Kristen N Ward, DO  sucralfate (CARAFATE) 1 G tablet Take 1 tablet (1 g total) by mouth 4 (four) times daily -  with meals and at bedtime. 01/16/14   Baxter Hire  N Ward, DO  SUMAtriptan (IMITREX) 6 MG/0.5ML SOLN injection Inject 6 mg into the skin every 2 (two) hours as needed for migraine or headache. May repeat in 2 hours if headache persists or recurs.    Historical Provider, MD  traMADol (ULTRAM) 50 MG tablet Take 1 tablet (50 mg total) by mouth every 6 (six) hours as needed. 01/16/14   Kristen N Ward, DO   BP 112/73  Pulse 47  Temp(Src) 98.5 F (36.9 C) (Oral)  Resp 16  SpO2 99%  LMP 02/13/2014  Physical Exam  Nursing note and vitals reviewed. Constitutional: She is oriented to person, place, and time. She appears well-developed and well-nourished. No distress.  HENT:  Head: Normocephalic and atraumatic.  Right Ear: External ear normal.  Left Ear: External ear normal.  Nose: Nose  normal.  Mouth/Throat: Oropharynx is clear and moist.  Eyes: Conjunctivae are normal.  Neck: Normal range of motion.  Cardiovascular: Normal rate, regular rhythm and normal heart sounds.   Pulses:      Posterior tibial pulses are 2+ on the right side, and 2+ on the left side.  Pulmonary/Chest: Effort normal and breath sounds normal. No stridor. No respiratory distress. She has no wheezes. She has no rales.  Abdominal: Soft. She exhibits no distension.  Musculoskeletal: Normal range of motion.  Tender to palpation to posterior and lateral left knee. No swelling. No calf tenderness. Sensation intact. Compartments soft.   Neurological: She is alert and oriented to person, place, and time. She has normal strength.  Antalgic gait.  Skin: Skin is warm and dry. She is not diaphoretic. No erythema.  Psychiatric: She has a normal mood and affect. Her behavior is normal.    ED Course  Procedures (including critical care time)  DIAGNOSTIC STUDIES: Oxygen Saturation is 99% on RA, normal by my interpretation.    COORDINATION OF CARE: 5:50 PM-Discussed treatment plan which includes xray and pain medication with pt at bedside and pt agreed to plan.   Labs Review   Imaging Review Dg Knee Complete 4 Views Left  03/07/2014   CLINICAL DATA:  Left knee injury  EXAM: LEFT KNEE - COMPLETE 4+ VIEW  COMPARISON:  None.  FINDINGS: There is no evidence of fracture, dislocation, or joint effusion. There is no evidence of arthropathy or other focal bone abnormality. Soft tissues are unremarkable.  IMPRESSION: Negative.   Electronically Signed   By: Elige Ko   On: 03/07/2014 19:06     EKG Interpretation None      MDM   Final diagnoses:  Left knee pain    Patient presents to ED with left knee pain after falling off a porch 3 days ago. XR is negative. Tenderness to palpation to posterior and lateral knee. Joint stable, compartment soft, neurovascularly intact. Patient given knee sleeve and small  course of percocet. She will follow up if pain persists. Discussed reasons to return to ED immediately. Vital signs stable for discharge. Patient / Family / Caregiver informed of clinical course, understand medical decision-making process, and agree with plan.   I personally performed the services described in this documentation, which was scribed in my presence. The recorded information has been reviewed and is accurate.  Mora Bellman, PA-C 03/11/14 [phone]

## 2014-03-07 NOTE — ED Notes (Signed)
Per pt, states she pulled something in her left leg-increase pain and burning

## 2014-03-11 NOTE — ED Provider Notes (Signed)
Medical screening examination/treatment/procedure(s) were performed by non-physician practitioner and as supervising physician I was immediately available for consultation/collaboration.   EKG Interpretation None        Rolland Porter, MD 03/11/14 [phone]

## 2014-05-01 ENCOUNTER — Emergency Department (HOSPITAL_COMMUNITY): Payer: PRIVATE HEALTH INSURANCE

## 2014-05-01 ENCOUNTER — Emergency Department (HOSPITAL_COMMUNITY)
Admission: EM | Admit: 2014-05-01 | Discharge: 2014-05-01 | Disposition: A | Discharge status: Home / Self Care | Payer: PRIVATE HEALTH INSURANCE | Attending: Emergency Medicine | Admitting: Emergency Medicine

## 2014-05-01 ENCOUNTER — Encounter (HOSPITAL_COMMUNITY): Payer: Self-pay | Admitting: *Deleted

## 2014-05-01 DIAGNOSIS — R079 Chest pain, unspecified: Secondary | ICD-10-CM | POA: Diagnosis present

## 2014-05-01 DIAGNOSIS — G43909 Migraine, unspecified, not intractable, without status migrainosus: Secondary | ICD-10-CM | POA: Diagnosis not present

## 2014-05-01 DIAGNOSIS — R0789 Other chest pain: Secondary | ICD-10-CM | POA: Diagnosis not present

## 2014-05-01 DIAGNOSIS — G8929 Other chronic pain: Secondary | ICD-10-CM | POA: Diagnosis not present

## 2014-05-01 DIAGNOSIS — Z872 Personal history of diseases of the skin and subcutaneous tissue: Secondary | ICD-10-CM | POA: Insufficient documentation

## 2014-05-01 DIAGNOSIS — Z720 Tobacco use: Secondary | ICD-10-CM | POA: Diagnosis not present

## 2014-05-01 DIAGNOSIS — K219 Gastro-esophageal reflux disease without esophagitis: Secondary | ICD-10-CM | POA: Insufficient documentation

## 2014-05-01 DIAGNOSIS — Z862 Personal history of diseases of the blood and blood-forming organs and certain disorders involving the immune mechanism: Secondary | ICD-10-CM | POA: Diagnosis not present

## 2014-05-01 DIAGNOSIS — Z79899 Other long term (current) drug therapy: Secondary | ICD-10-CM | POA: Insufficient documentation

## 2014-05-01 DIAGNOSIS — J45909 Unspecified asthma, uncomplicated: Secondary | ICD-10-CM | POA: Insufficient documentation

## 2014-05-01 MED ORDER — IBUPROFEN 600 MG PO TABS: 600.0000 mg | ORAL_TABLET | Freq: Four times a day (QID) | ORAL | Status: DC | PRN

## 2014-05-01 MED ORDER — OXYCODONE-ACETAMINOPHEN 5-325 MG PO TABS
1.0000 | ORAL_TABLET | Freq: Once | ORAL | Status: AC
  Administered 2014-05-01: 1 via ORAL
  Filled 2014-05-01: qty 1

## 2014-05-01 MED ORDER — OXYCODONE-ACETAMINOPHEN 5-325 MG PO TABS: 1.0000 | ORAL_TABLET | Freq: Four times a day (QID) | ORAL | Status: DC | PRN

## 2014-05-01 MED ORDER — CYCLOBENZAPRINE HCL 5 MG PO TABS: 5.0000 mg | ORAL_TABLET | Freq: Three times a day (TID) | ORAL | Status: DC | PRN

## 2014-05-01 NOTE — Discharge Instructions (Signed)
Return to the ED with any concerns including difficutly breathing, fainting, leg swelling, worsening pain, decreased level of alertness/lethargy, or any other alarming symptoms   Emergency Department Resource Guide 1) Find a Doctor and Pay Out of Pocket Although you won't have to find out who is covered by your insurance plan, it is a good idea to ask around and get recommendations. You will then need to call the office and see if the doctor you have chosen will accept you as a new patient and what types of options they offer for patients who are self-pay. Some doctors offer discounts or will set up payment plans for their patients who do not have insurance, but you will need to ask so you aren't surprised when you get to your appointment.  2) Contact Your Local Health Department Not all health departments have doctors that can see patients for sick visits, but many do, so it is worth a call to see if yours does. If you don't know where your local health department is, you can check in your phone book. The CDC also has a tool to help you locate your state's health department, and many state websites also have listings of all of their local health departments.  3) Find a Walk-in Clinic If your illness is not likely to be very severe or complicated, you may want to try a walk in clinic. These are popping up all over the country in pharmacies, drugstores, and shopping centers. They're usually staffed by nurse practitioners or physician assistants that have been trained to treat common illnesses and complaints. They're usually fairly quick and inexpensive. However, if you have serious medical issues or chronic medical problems, these are probably not your best option.  No Primary Care Doctor: - Call Health Connect at  [phone] - they can help you locate a primary care doctor that  accepts your insurance, provides certain services, etc. - Physician Referral Service- [phone]  Chronic Pain  Problems: Organization         Address  Phone   Notes  [facility]  [phone]) [phone] Patients need to be referred by their primary care doctor.   Medication Assistance: Organization         Address  Phone   Notes  [facility] [address] [place], [place] [address] [phone]) [phone] --Must be a resident of [place] [facility] [place] -- Must have NO insurance coverage whatsoever (no Medicaid/ Medicare, etc.) -- The pt. MUST have a primary care doctor that directs their care regularly and follows them in the community   MedAssist  [phone]   [employer]  [phone]) [phone]    Agencies that provide inexpensive medical care: Organization         Address  Phone   Notes  [facility]  [phone]) [phone]   [facility]    [phone]) [phone]   [facility] [address] [place], [place] [address] [phone]   [facility] [address], [place] [phone]) [phone]   [facility]anned Parenthood    [phone]) [phone]   [facility]    [phone]   [facility]  [address],  Phone:  [phone], Fax:  [phone] Hours of Operation:  9 am - 6 pm, M-F.  Also accepts Medicaid/Medicare and self-pay.  [facility]  [address], [place] Phone: (  [phone], Fax: [phone]. Hours of Operation:  8:30 am - 5:30 pm, M-F.  Also accepts Medicaid and self-pay.  [facility] [place] [address] [place] Phone: [phone]   [place], [place], [place] [phone], Ext. [phone] Mondays & Thursdays: 7-9 AM.  First 15 patients are seen on a first come, first serve basis.    [place] Providers:  Organization         Address  Phone   Notes  [facility] [address], [place] [phone] Also  accepts self-pay patients.  [facility] [address], [place]  [phone]   [place], [address], [place] [phone]   [facility] [address] [place] [phone]   Lucianne Lei [address], [place]   [phone] Only accepts [place] Access [place] patients after they have their name applied to their card.   Self-Pay (no insurance) in [facility]:  Organization         Address  Phone   Notes  Sickle Cell Patients, [facility] [place] [phone]   [facility] [place] [phone]   [facility] [place]  [place], [place], [place] [phone]   [facility]/Dr. Osei-Bonsu  [address], [place] or [address], [place] [phone] Phone number for both [place] and [place] locations is the same.  Urgent Medical and [facility] [address], [place] [phone]   [facility] [address] [place] or [address] [phone]   [facility] [address] [place] [phone], phone; [phone], fax Sees patients 1st and 3rd Saturday of every month.  Must not qualify for public or private insurance (i.e. Medicaid, Medicare, [place] Health Choice, Veterans' Benefits)  Household income should be no more than 200% of the poverty level The clinic cannot treat you if you are pregnant or think you are pregnant  Sexually transmitted diseases are not treated at the clinic.    Dental Care: Organization         Address  Phone  Notes  [facility] [place] [phone] Accepts children up to age 85 who are enrolled in [place] or [place]; pregnant  women with a Medicaid card; and children who have applied for Medicaid or [place] Health Choice, but were declined, whose parents can pay a reduced fee at time of service.  [facility] Department of [facility]  [address], [place] [phone] Accepts children up to age 44 who are enrolled in [place] or [place]; pregnant women with a Medicaid card; and children who have applied for Medicaid or [place] Health Choice, but were declined, whose parents can pay a reduced fee at time of service.  [place] [facility] Access PROGRAM  [place] [phone] Patients are seen by appointment only. Walk-ins are not accepted. [place] will see patients 58 years of age and older. Monday - Tuesday (8am-5pm) Most Wednesdays (8:30-5pm) $30 per visit, cash only  [place] [facility]  [address],  [place] [phone] Patients are seen by appointment only. Walk-ins are not accepted. [place] will see patients 62 years of age and older. One Wednesday Evening (Monthly: Volunteer Based).  $30 per visit, cash only  [place]  [phone] for adults; Children under age 77, call Graduate Pediatric Dentistry at [phone]. Children aged 45-14, please call [phone] to request a pediatric application.  Dental services are provided in all areas of dental care including fillings, crowns and bridges, complete and partial dentures, implants, gum treatment, root canals, and extractions. Preventive care is also provided. Treatment is provided to both adults and children. Patients are selected via a lottery and there is often a waiting list.   [facility] [place] [address], [place]  [phone] [email or web address]   [facility] [address] [place], [place] [phone], Ext. [phone] Second and Fourth Thursday of each month, opens at 6:30 AM; Clinic ends at 9 AM.  Patients are  seen on a first-come first-served basis, and a limited number are seen during each clinic.   [facility]  [address] [place], [place] [phone]   Eligibility Requirements You must have lived in [place], [place], or [place] counties for at least the last three months.   You cannot be eligible for state or federal sponsored [employer], including [employer], [place], or [employer].   You generally cannot be eligible for healthcare insurance through your employer.    How to apply: Eligibility screenings are held every Tuesday and Wednesday afternoon from 1:00 pm until 4:00 pm. You do not need an appointment for the interview!  [facility] [address], [place], [place]   [place]  [place] Department  [place]  [phone]    Behavioral Health Resources in the Community: Intensive Outpatient Programs Organization         Address  Phone  Notes  [facility] [place]. [address] [place], [place] [phone]   [facility] Outpatient [address], [place], [place]   ADS: Alcohol & Drug Svcs [address], [place], [place]   [place] [address]  [place], [place] or [phone]   Substance Abuse Resources Organization         Address  Phone  Notes  Alcohol and Drug Services  [phone]   [place]  [phone]   [facility] [place]   Chinita Pester  [phone]   Residential & Outpatient Substance Abuse Program  [phone]   Psychological Services Organization         Address  Phone  Notes  [facility] [place]  [place]  [phone]   [place] [address] [place] or [phone]    Mobile Crisis  Teams Organization         Address  Phone  Notes  [facility], Mobile Crisis Care Unit  [phone]   Assertive Psychotherapeutic Services  [address] [place], [place]   [place] [address], [place] [phone]    Self-Help/Support Groups Organization         Address  Phone             Notes  [place]. of [place] - variety of support  groups  [phone] Call for more information  Narcotics Anonymous (NA), Caring [facility] [address], [place]  2 meetings at this location   Residential Sports administratorTreatment Programs Organization         Address  Phone  Notes  [facility] [address],    [place]  [phone]   [facility]  [address], [place] [phone], [place], [place] [phone]   [facility] [address], [place] [phone] Admissions: 8am-3pm M-F  [facility] [address]    [place], [place] [phone]   [facility] [address], [place], [place] [phone]   [facility] [address]  [place], [place] [phone]   [facility] - Intensive Outpatient [address], [place], [place] [phone]   [facility] ([facility] [address]  [place], [place] [phone] or [phone]   [facility]idential Treatment Services (RTS) [address] [place], [place] [phone] Accepts Medicaid  [place] [facility] [address]  [place] [phone] Substance Abuse/Addiction Treatment   [facility] Behavioral Health Resources Organization         Address  Phone  Notes  [facility]  [phone]) [phone]   Angie FavaJulie Brannon, PhD [address], [address] [place], [place]   [phone](336) [phone] or [phone]) [phone]   [facility] [place]   [address] [place], [place] [phone](336) [phone]   [facility] [address], [place], [place] [phone](336) [phone]  Insurance/Medicaid/sponsorship through [facility]  Faith and Families [address]                                    [place], [place] [phone]) [phone] Therapy/tele-psych/case  [facility] [address]   [place], [place] [phone]) [phone]    Dr. Lolly MustacheArfeen  [phone](336) [phone]   [facility] [place] [place]  United Way [facility] 1) [address], [place] 2) [address], [place] 3)  [address], [place] [phone]) [phone](336) [phone]  [phone](336) [phone]   [facility] Child Abuse Hotline [phone](336) [phone] or [phone](336) [phone] (After Hours)

## 2014-05-01 NOTE — ED Notes (Signed)
Pt in c/o chest pain with movement or coughing, states it is worse when lifting something, thinks she may have a pulled muscle from lifting boxes at work, no distress noted

## 2014-05-01 NOTE — ED Notes (Signed)
Pt states she was lifting at work on Friday and has been experiencing chest discomfort with lifting things and coughing, thinks she pulled something.

## 2014-05-01 NOTE — ED Provider Notes (Signed)
CSN: [id number]     Arrival date & time 05/01/14  1726 History   First MD Initiated Contact with Patient 05/01/14 2006     Chief Complaint  Patient presents with  . Chest Pain     (Consider location/radiation/quality/duration/timing/severity/associated sxs/prior Treatment) HPI  Pt presents with c/o pain in central chest area which began Friday after doing some heavy lifting at work.  She states the pain is only present when she moves her arms a certain way or presses down on the area, or lifts heavy items.  No sob, no fever/chills.  No cough.  No leg swelling.  No hx of DVT/PE, no recent travel/trauma/surgery.  No diaphoresis or nausea.  Pt has not had any treatment for her symptoms prior to arrival.  There are no other associated systemic symptoms, there are no other alleviating or modifying factors.   Past Medical History  Diagnosis Date  . Migraine   . GERD (gastroesophageal reflux disease)   . Ulcer   . Toothache   . Chronic pain   . Bronchitis   . Sickle cell trait   . Asthma    Past Surgical History  Procedure Laterality Date  . Myringotomy      age 43  . Esophagogastroduodenoscopy (egd) with propofol N/A 04/04/2013    Procedure: ESOPHAGOGASTRODUODENOSCOPY (EGD) WITH PROPOFOL;  Surgeon: Willis ModenaWilliam Outlaw, MD;  Location: [facility];  Service: Endoscopy;  Laterality: N/A;  . Examination under anesthesia N/A 08/10/2013    Procedure: RECTAL EXAM UNDER ANESTHESIA;  Surgeon: Clovis Puhomas A. Cornett, MD;  Location: [facility];  Service: General;  Laterality: N/A;  ligation   Family History  Problem Relation Age of Onset  . Heart disease Mother   . Kidney disease Mother   . Hypertension Mother   . COPD Mother   . Diabetes Mother   . Heart disease Father    History  Substance Use Topics  . Smoking status: Current Every Day Smoker -- 0.50 packs/day for 15 years    Types: Cigarettes  . Smokeless tobacco: Never Used  . Alcohol Use: Yes     Comment: very rare   OB  History    No data available     Review of Systems  ROS reviewed and all otherwise negative except for mentioned in HPI    Allergies  Hydrocodone-acetaminophen  Home Medications   Prior to Admission medications   Medication Sig Start Date End Date Taking? Authorizing Provider  Ranitidine HCl (ZANTAC PO) Take 1 tablet by mouth every 4 (four) hours as needed (stomach).   Yes Historical Provider, MD  SUMAtriptan (IMITREX) 6 MG/0.5ML SOLN injection Inject 6 mg into the skin every 2 (two) hours as needed for migraine or headache. May repeat in 2 hours if headache persists or recurs.   Yes Historical Provider, MD  cyclobenzaprine (FLEXERIL) 5 MG tablet Take 1 tablet (5 mg total) by mouth 3 (three) times daily as needed for muscle spasms. 05/01/14   Ethelda ChickMartha K Linker, MD  esomeprazole (NEXIUM) 40 MG capsule Take 1 capsule (40 mg total) by mouth 2 (two) times daily before a meal. Patient not taking: Reported on 05/01/2014 04/04/13   Willis ModenaWilliam Outlaw, MD  ibuprofen (ADVIL,MOTRIN) 600 MG tablet Take 1 tablet (600 mg total) by mouth every 6 (six) hours as needed. 05/01/14   Ethelda ChickMartha K Linker, MD  oxyCODONE-acetaminophen (PERCOCET/ROXICET) 5-325 MG per tablet Take 1 tablet by mouth every 6 (six) hours as needed for moderate pain or severe pain. 05/01/14  Ethelda ChickMartha K Linker, MD   BP 101/65 mmHg  Pulse 85  Temp(Src) 98.7 F (37.1 C) (Oral)  Resp 12  Ht 5\' 4"  (1.626 m)  Wt 129 lb (58.514 kg)  BMI 22.13 kg/m2  SpO2 100%  LMP 04/18/2014  Vitals reviewed Physical Exam  Physical Examination: General appearance - alert, well appearing, and in no distress Mental status - alert, oriented to person, place, and time Eyes - no conjunctival injection, no scleral icterus Mouth - mucous membranes moist, pharynx normal without lesions Chest - clear to auscultation, no wheezes, rales or rhonchi, symmetric air entry, tender to palpation at sternocostal junctions diffusely Heart - normal rate, regular rhythm,  normal S1, S2, no murmurs, rubs, clicks or gallops Abdomen - soft, nontender, nondistended, no masses or organomegaly Extremities - peripheral pulses normal, no pedal edema, no clubbing or cyanosis Skin - normal coloration and turgor, no rashes  ED Course  Procedures (including critical care time) Labs Review Labs Reviewed - No data to display  Imaging Review Dg Chest 2 View  05/01/2014   CLINICAL DATA:  Acute chest pain for 4 days.  EXAM: CHEST  2 VIEW  COMPARISON:  01/16/2014  FINDINGS: The cardiac silhouette, mediastinal and hilar contours are within normal limits and stable. Prominent right infrahilar vessels due to mild pectus deformity. No infiltrates, edema or effusions. The bony thorax is intact.  IMPRESSION: No acute cardiopulmonary findings.   Electronically Signed   By: Loralie ChampagneMark  Gallerani M.D.   On: 05/01/2014 18:16     EKG Interpretation   Date/Time:  Monday May 01 2014 17:40:41 EST Ventricular Rate:  96 PR Interval:  142 QRS Duration: 72 QT Interval:  346 QTC Calculation: 437 R Axis:   79 Text Interpretation:  Sinus rhythm with Premature atrial complexes  Otherwise normal ECG Since previous tracing PACs are new Confirmed by  Karma GanjaLINKER  MD, MARTHA [phone]([id number]) on 05/01/2014 8:28:01 PM      MDM   Final diagnoses:  Musculoskeletal chest pain    Pt presents with c/o chest wall pain after heavy lifting at work.  She is tender to palpation diffusely over sternum and sternocostral junctions.  Xray reassuring.  EKG reassurign.  Doubt ACS, low risk for PE, PERC 0.  Pt given rx for pain medication and anti-inflammatory as well.  Discharged with strict return precautions.  Pt agreeable with plan.    Ethelda ChickMartha K Linker, MD 05/01/14 [phone]

## 2014-07-01 ENCOUNTER — Encounter (HOSPITAL_COMMUNITY): Payer: Self-pay | Admitting: *Deleted

## 2014-07-01 ENCOUNTER — Emergency Department (HOSPITAL_COMMUNITY)
Admission: EM | Admit: 2014-07-01 | Discharge: 2014-07-02 | Disposition: A | Discharge status: Home / Self Care | Payer: PRIVATE HEALTH INSURANCE | Attending: Emergency Medicine | Admitting: Emergency Medicine

## 2014-07-01 DIAGNOSIS — Y9389 Activity, other specified: Secondary | ICD-10-CM | POA: Diagnosis not present

## 2014-07-01 DIAGNOSIS — K219 Gastro-esophageal reflux disease without esophagitis: Secondary | ICD-10-CM | POA: Diagnosis not present

## 2014-07-01 DIAGNOSIS — X58XXXA Exposure to other specified factors, initial encounter: Secondary | ICD-10-CM | POA: Diagnosis not present

## 2014-07-01 DIAGNOSIS — Z862 Personal history of diseases of the blood and blood-forming organs and certain disorders involving the immune mechanism: Secondary | ICD-10-CM | POA: Insufficient documentation

## 2014-07-01 DIAGNOSIS — G43909 Migraine, unspecified, not intractable, without status migrainosus: Secondary | ICD-10-CM | POA: Insufficient documentation

## 2014-07-01 DIAGNOSIS — J45909 Unspecified asthma, uncomplicated: Secondary | ICD-10-CM | POA: Diagnosis not present

## 2014-07-01 DIAGNOSIS — Y9289 Other specified places as the place of occurrence of the external cause: Secondary | ICD-10-CM | POA: Insufficient documentation

## 2014-07-01 DIAGNOSIS — Z79899 Other long term (current) drug therapy: Secondary | ICD-10-CM | POA: Diagnosis not present

## 2014-07-01 DIAGNOSIS — G8929 Other chronic pain: Secondary | ICD-10-CM | POA: Diagnosis not present

## 2014-07-01 DIAGNOSIS — Y998 Other external cause status: Secondary | ICD-10-CM | POA: Insufficient documentation

## 2014-07-01 DIAGNOSIS — T162XXA Foreign body in left ear, initial encounter: Secondary | ICD-10-CM | POA: Diagnosis present

## 2014-07-01 DIAGNOSIS — Z720 Tobacco use: Secondary | ICD-10-CM | POA: Diagnosis not present

## 2014-07-01 NOTE — ED Provider Notes (Signed)
CSN: [id number]     Arrival date & time 07/01/14  2230 History  This chart was scribed for non-physician practitioner, Elpidio AnisShari Eliyohu Class, PA-C working with Tomasita CrumbleAdeleke Oni, MD by Jarvis Morganaylor Ferguson, ED Scribe. This patient was seen in room WTR5/WTR5 and the patient's care was started at 11:14 PM.     Chief Complaint  Patient presents with  . Foreign Body in Ear    Patient is a 44 y.o. female presenting with foreign body in ear. The history is provided by the patient. No language interpreter was used.  Foreign Body in Ear This is a new problem. The current episode started 1 to 2 hours ago. The problem occurs rarely. The problem has not changed since onset.Nothing aggravates the symptoms. Nothing relieves the symptoms. She has tried nothing for the symptoms.    HPI Comments: Jenny Evans is a 44 y.o. female with a h/p sickle cell trait and asthma who presents to the Emergency Department complaining of a foreign body stuck in left ear for 2 hours. Pt states she was using a q-tip and the cotton tip got stuck in her ear. Pt denies any pain or any other complaints at this time.    Past Medical History  Diagnosis Date  . Migraine   . GERD (gastroesophageal reflux disease)   . Ulcer   . Toothache   . Chronic pain   . Bronchitis   . Sickle cell trait   . Asthma    Past Surgical History  Procedure Laterality Date  . Myringotomy      age 44  . Esophagogastroduodenoscopy (egd) with propofol N/A 04/04/2013    Procedure: ESOPHAGOGASTRODUODENOSCOPY (EGD) WITH PROPOFOL;  Surgeon: Willis ModenaWilliam Outlaw, MD;  Location: [facility];  Service: Endoscopy;  Laterality: N/A;  . Examination under anesthesia N/A 08/10/2013    Procedure: RECTAL EXAM UNDER ANESTHESIA;  Surgeon: Clovis Puhomas A. Cornett, MD;  Location: [facility];  Service: General;  Laterality: N/A;  ligation   Family History  Problem Relation Age of Onset  . Heart disease Mother   . Kidney disease Mother   . Hypertension Mother   . COPD  Mother   . Diabetes Mother   . Heart disease Father    History  Substance Use Topics  . Smoking status: Current Every Day Smoker -- 0.50 packs/day for 15 years    Types: Cigarettes  . Smokeless tobacco: Never Used  . Alcohol Use: Yes     Comment: very rare   OB History    No data available     Review of Systems  HENT: Negative for ear pain.        Cotton end of q-tip stuck in left ear      Allergies  Hydrocodone-acetaminophen  Home Medications   Prior to Admission medications   Medication Sig Start Date End Date Taking? Authorizing Provider  cyclobenzaprine (FLEXERIL) 5 MG tablet Take 1 tablet (5 mg total) by mouth 3 (three) times daily as needed for muscle spasms. 05/01/14   Ethelda ChickMartha K Linker, MD  esomeprazole (NEXIUM) 40 MG capsule Take 1 capsule (40 mg total) by mouth 2 (two) times daily before a meal. Patient not taking: Reported on 05/01/2014 04/04/13   Willis ModenaWilliam Outlaw, MD  ibuprofen (ADVIL,MOTRIN) 600 MG tablet Take 1 tablet (600 mg total) by mouth every 6 (six) hours as needed. 05/01/14   Ethelda ChickMartha K Linker, MD  oxyCODONE-acetaminophen (PERCOCET/ROXICET) 5-325 MG per tablet Take 1 tablet by mouth every 6 (six) hours as needed for moderate  pain or severe pain. 05/01/14   Ethelda Chick, MD  Ranitidine HCl (ZANTAC PO) Take 1 tablet by mouth every 4 (four) hours as needed (stomach).    Historical Provider, MD  SUMAtriptan (IMITREX) 6 MG/0.5ML SOLN injection Inject 6 mg into the skin every 2 (two) hours as needed for migraine or headache. May repeat in 2 hours if headache persists or recurs.    Historical Provider, MD   Triage Vitals: BP 112/69 mmHg  Pulse 92  Temp(Src) 98.2 F (36.8 C) (Oral)  Resp 19  SpO2 100%  LMP 06/21/2014 (Approximate)  Physical Exam  Constitutional: She is oriented to person, place, and time. She appears well-developed and well-nourished. No distress.  HENT:  Head: Normocephalic and atraumatic.  White cotton piece deep in left external canal   Eyes: Conjunctivae and EOM are normal.  Neck: Neck supple. No tracheal deviation present.  Cardiovascular: Normal rate.   Pulmonary/Chest: Effort normal. No respiratory distress.  Musculoskeletal: Normal range of motion.  Neurological: She is alert and oriented to person, place, and time.  Skin: Skin is warm and dry.  Psychiatric: She has a normal mood and affect. Her behavior is normal.  Nursing note and vitals reviewed.   ED Course  Procedures (including critical care time)  DIAGNOSTIC STUDIES: Oxygen Saturation is 100% on RA, normal by my interpretation.    COORDINATION OF CARE:    Labs Review Labs Reviewed - No data to display  Imaging Review No results found.   EKG Interpretation None      MDM   Final diagnoses:  None    1. Foreign body left ear  FB removed via lavage and with forceps by nursing staff.   I personally performed the services described in this documentation, which was scribed in my presence. The recorded information has been reviewed and is accurate.     Arnoldo Hooker, PA-C 07/06/14 0559  Tomasita Crumble, MD 07/06/14 1055

## 2014-07-01 NOTE — ED Notes (Signed)
Accidentally got a Q tip stuck in left ear.

## 2014-07-02 NOTE — Discharge Instructions (Signed)
Ear Foreign Body °An ear foreign body is an object that is stuck in the ear. Objects in the ear can cause pain, hearing loss, and buzzing or roaring sounds. They can also cause fluid to come from the ear. °HOME CARE  °· Keep all doctor visits as told. °· Keep small objects away from children. Tell them not to put things in their ears. °GET HELP RIGHT AWAY IF:  °· You have blood coming from your ear. °· You have more pain or puffiness (swelling) in the ear. °· You have trouble hearing. °· You have fluid (discharge) coming from the ear. °· You have a fever. °· You get a headache. °MAKE SURE YOU:  °· Understand these instructions. °· Will watch your condition. °· Will get help right away if you are not doing well or get worse. °Document Released: 11/20/2009 Document Revised: 08/25/2011 Document Reviewed: 11/20/2009 °Exit[employer]® Patient Information ©2015 [employer]. This information is not intended to replace advice given to you by your health care provider. Make sure you discuss any questions you have with your health care provider. ° °

## 2014-07-09 ENCOUNTER — Emergency Department (HOSPITAL_BASED_OUTPATIENT_CLINIC_OR_DEPARTMENT_OTHER): Payer: PRIVATE HEALTH INSURANCE

## 2014-07-09 ENCOUNTER — Encounter (HOSPITAL_BASED_OUTPATIENT_CLINIC_OR_DEPARTMENT_OTHER): Payer: Self-pay

## 2014-07-09 ENCOUNTER — Emergency Department (HOSPITAL_BASED_OUTPATIENT_CLINIC_OR_DEPARTMENT_OTHER)
Admission: EM | Admit: 2014-07-09 | Discharge: 2014-07-09 | Disposition: A | Discharge status: Home / Self Care | Payer: PRIVATE HEALTH INSURANCE | Attending: Emergency Medicine | Admitting: Emergency Medicine

## 2014-07-09 DIAGNOSIS — K529 Noninfective gastroenteritis and colitis, unspecified: Secondary | ICD-10-CM | POA: Diagnosis not present

## 2014-07-09 DIAGNOSIS — G43909 Migraine, unspecified, not intractable, without status migrainosus: Secondary | ICD-10-CM | POA: Insufficient documentation

## 2014-07-09 DIAGNOSIS — J45909 Unspecified asthma, uncomplicated: Secondary | ICD-10-CM | POA: Diagnosis not present

## 2014-07-09 DIAGNOSIS — Z79899 Other long term (current) drug therapy: Secondary | ICD-10-CM | POA: Diagnosis not present

## 2014-07-09 DIAGNOSIS — Z872 Personal history of diseases of the skin and subcutaneous tissue: Secondary | ICD-10-CM | POA: Diagnosis not present

## 2014-07-09 DIAGNOSIS — R1030 Lower abdominal pain, unspecified: Secondary | ICD-10-CM

## 2014-07-09 DIAGNOSIS — Z3202 Encounter for pregnancy test, result negative: Secondary | ICD-10-CM | POA: Insufficient documentation

## 2014-07-09 DIAGNOSIS — G8929 Other chronic pain: Secondary | ICD-10-CM | POA: Diagnosis not present

## 2014-07-09 DIAGNOSIS — Z862 Personal history of diseases of the blood and blood-forming organs and certain disorders involving the immune mechanism: Secondary | ICD-10-CM | POA: Diagnosis not present

## 2014-07-09 DIAGNOSIS — K219 Gastro-esophageal reflux disease without esophagitis: Secondary | ICD-10-CM | POA: Insufficient documentation

## 2014-07-09 DIAGNOSIS — E876 Hypokalemia: Secondary | ICD-10-CM | POA: Insufficient documentation

## 2014-07-09 DIAGNOSIS — Z720 Tobacco use: Secondary | ICD-10-CM | POA: Insufficient documentation

## 2014-07-09 DIAGNOSIS — R1031 Right lower quadrant pain: Secondary | ICD-10-CM | POA: Diagnosis present

## 2014-07-09 LAB — COMPREHENSIVE METABOLIC PANEL
ALBUMIN: 3.7 g/dL (ref 3.5–5.2)
ALT: 12 U/L (ref 0–35)
AST: 20 U/L (ref 0–37)
Alkaline Phosphatase: 67 U/L (ref 39–117)
Anion gap: 3 — ABNORMAL LOW (ref 5–15)
BILIRUBIN TOTAL: 0.3 mg/dL (ref 0.3–1.2)
BUN: 7 mg/dL (ref 6–23)
CHLORIDE: 105 mmol/L (ref 96–112)
CO2: 26 mmol/L (ref 19–32)
CREATININE: 0.52 mg/dL (ref 0.50–1.10)
Calcium: 8.6 mg/dL (ref 8.4–10.5)
Glucose, Bld: 102 mg/dL — ABNORMAL HIGH (ref 70–99)
POTASSIUM: 2.9 mmol/L — AB (ref 3.5–5.1)
Sodium: 134 mmol/L — ABNORMAL LOW (ref 135–145)
Total Protein: 6.9 g/dL (ref 6.0–8.3)

## 2014-07-09 LAB — URINALYSIS, ROUTINE W REFLEX MICROSCOPIC
Bilirubin Urine: NEGATIVE
GLUCOSE, UA: NEGATIVE mg/dL
Hgb urine dipstick: NEGATIVE
KETONES UR: NEGATIVE mg/dL
Leukocytes, UA: NEGATIVE
NITRITE: NEGATIVE
Protein, ur: NEGATIVE mg/dL
SPECIFIC GRAVITY, URINE: 1.015 (ref 1.005–1.030)
UROBILINOGEN UA: 1 mg/dL (ref 0.0–1.0)
pH: 6 (ref 5.0–8.0)

## 2014-07-09 LAB — CBC WITH DIFFERENTIAL/PLATELET
BASOS ABS: 0 10*3/uL (ref 0.0–0.1)
Basophils Relative: 0 % (ref 0–1)
Eosinophils Absolute: 0.1 10*3/uL (ref 0.0–0.7)
Eosinophils Relative: 2 % (ref 0–5)
HCT: 24.4 % — ABNORMAL LOW (ref 36.0–46.0)
Hemoglobin: 7.8 g/dL — ABNORMAL LOW (ref 12.0–15.0)
Lymphocytes Relative: 33 % (ref 12–46)
Lymphs Abs: 2.1 10*3/uL (ref 0.7–4.0)
MCH: 23.1 pg — AB (ref 26.0–34.0)
MCHC: 32 g/dL (ref 30.0–36.0)
MCV: 72.4 fL — AB (ref 78.0–100.0)
Monocytes Absolute: 0.7 10*3/uL (ref 0.1–1.0)
Monocytes Relative: 11 % (ref 3–12)
NEUTROS ABS: 3.4 10*3/uL (ref 1.7–7.7)
Neutrophils Relative %: 54 % (ref 43–77)
Platelets: 426 10*3/uL — ABNORMAL HIGH (ref 150–400)
RBC: 3.37 MIL/uL — ABNORMAL LOW (ref 3.87–5.11)
RDW: 15.8 % — AB (ref 11.5–15.5)
WBC: 6.3 10*3/uL (ref 4.0–10.5)

## 2014-07-09 LAB — LIPASE, BLOOD: Lipase: 29 U/L (ref 11–59)

## 2014-07-09 LAB — PREGNANCY, URINE: Preg Test, Ur: NEGATIVE

## 2014-07-09 LAB — I-STAT CG4 LACTIC ACID, ED: LACTIC ACID, VENOUS: 0.96 mmol/L (ref 0.5–2.0)

## 2014-07-09 MED ORDER — IOHEXOL 300 MG/ML  SOLN
100.0000 mL | Freq: Once | INTRAMUSCULAR | Status: AC | PRN
  Administered 2014-07-09: 100 mL via INTRAVENOUS

## 2014-07-09 MED ORDER — CIPROFLOXACIN IN D5W 400 MG/200ML IV SOLN
400.0000 mg | Freq: Once | INTRAVENOUS | Status: AC
  Administered 2014-07-09: 400 mg via INTRAVENOUS
  Filled 2014-07-09: qty 200

## 2014-07-09 MED ORDER — IOHEXOL 300 MG/ML  SOLN
25.0000 mL | Freq: Once | INTRAMUSCULAR | Status: AC | PRN
  Administered 2014-07-09: 25 mL via ORAL

## 2014-07-09 MED ORDER — METRONIDAZOLE 500 MG PO TABS: 500.0000 mg | ORAL_TABLET | Freq: Three times a day (TID) | ORAL | Status: DC

## 2014-07-09 MED ORDER — POTASSIUM CHLORIDE CRYS ER 20 MEQ PO TBCR: 40.0000 meq | EXTENDED_RELEASE_TABLET | Freq: Every day | ORAL | Status: DC

## 2014-07-09 MED ORDER — METRONIDAZOLE IN NACL 5-0.79 MG/ML-% IV SOLN
500.0000 mg | Freq: Once | INTRAVENOUS | Status: AC
  Administered 2014-07-09: 500 mg via INTRAVENOUS
  Filled 2014-07-09: qty 100

## 2014-07-09 MED ORDER — POTASSIUM CHLORIDE CRYS ER 20 MEQ PO TBCR
40.0000 meq | EXTENDED_RELEASE_TABLET | Freq: Once | ORAL | Status: AC
  Administered 2014-07-09: 40 meq via ORAL
  Filled 2014-07-09: qty 2

## 2014-07-09 MED ORDER — OXYCODONE-ACETAMINOPHEN 5-325 MG PO TABS
1.0000 | ORAL_TABLET | ORAL | Status: DC | PRN
Start: 2014-07-09 — End: 2014-10-30

## 2014-07-09 MED ORDER — SODIUM CHLORIDE 0.9 % IV BOLUS (SEPSIS)
1000.0000 mL | Freq: Once | INTRAVENOUS | Status: AC
  Administered 2014-07-09: 1000 mL via INTRAVENOUS

## 2014-07-09 MED ORDER — CIPROFLOXACIN HCL 500 MG PO TABS: 500.0000 mg | ORAL_TABLET | Freq: Two times a day (BID) | ORAL | Status: DC

## 2014-07-09 NOTE — ED Provider Notes (Signed)
CSN: [id number]     Arrival date & time 07/09/14  1630 History  This chart was scribed for Gilda Creasehristopher J. Micheala Morissette, * by Roxy Cedarhandni Bhalodia, ED Scribe. This patient was seen in room MH11/MH11 and the patient's care was started at 5:05 PM.  Chief Complaint  Patient presents with  . Abdominal Pain   Patient is a 44 y.o. female presenting with abdominal pain. The history is provided by the patient. No language interpreter was used.  Abdominal Pain Pain location:  RLQ Pain quality: aching and cramping   Pain radiates to:  Does not radiate Pain severity:  Moderate Onset quality:  Gradual Duration:  3 days Timing:  Constant Associated symptoms: nausea     HPI Comments: Jenny Evans is a 44 y.o. female with a PMHx of hemorrhoids, GERD, ulcer, bronchitis, sickle cell trait, and asthma, who presents to the Emergency Department complaining of moderate RLQ abdominal pain that began 3 days ago. She reports associated bloody and watery diarrhea and nausea. She denies associated constipation.  Past Medical History  Diagnosis Date  . Migraine   . GERD (gastroesophageal reflux disease)   . Ulcer   . Toothache   . Chronic pain   . Bronchitis   . Sickle cell trait   . Asthma    Past Surgical History  Procedure Laterality Date  . Myringotomy      age 44  . Esophagogastroduodenoscopy (egd) with propofol N/A 04/04/2013    Procedure: ESOPHAGOGASTRODUODENOSCOPY (EGD) WITH PROPOFOL;  Surgeon: Willis ModenaWilliam Outlaw, MD;  Location: [facility];  Service: Endoscopy;  Laterality: N/A;  . Examination under anesthesia N/A 08/10/2013    Procedure: RECTAL EXAM UNDER ANESTHESIA;  Surgeon: Clovis Puhomas A. Cornett, MD;  Location: [facility];  Service: General;  Laterality: N/A;  ligation   Family History  Problem Relation Age of Onset  . Heart disease Mother   . Kidney disease Mother   . Hypertension Mother   . COPD Mother   . Diabetes Mother   . Heart disease Father    History  Substance Use  Topics  . Smoking status: Current Every Day Smoker -- 0.50 packs/day for 15 years    Types: Cigarettes  . Smokeless tobacco: Never Used  . Alcohol Use: Yes     Comment: very rare   OB History    No data available     Review of Systems  Gastrointestinal: Positive for nausea, abdominal pain and blood in stool.  All other systems reviewed and are negative.  Allergies  Hydrocodone-acetaminophen  Home Medications   Prior to Admission medications   Medication Sig Start Date End Date Taking? Authorizing Provider  cyclobenzaprine (FLEXERIL) 5 MG tablet Take 1 tablet (5 mg total) by mouth 3 (three) times daily as needed for muscle spasms. Patient not taking: Reported on 07/02/2014 05/01/14   Ethelda ChickMartha K Linker, MD  esomeprazole (NEXIUM) 40 MG capsule Take 1 capsule (40 mg total) by mouth 2 (two) times daily before a meal. 04/04/13   Willis ModenaWilliam Outlaw, MD  ibuprofen (ADVIL,MOTRIN) 600 MG tablet Take 1 tablet (600 mg total) by mouth every 6 (six) hours as needed. Patient not taking: Reported on 07/02/2014 05/01/14   Ethelda ChickMartha K Linker, MD  oxyCODONE-acetaminophen (PERCOCET/ROXICET) 5-325 MG per tablet Take 1 tablet by mouth every 6 (six) hours as needed for moderate pain or severe pain. Patient not taking: Reported on 07/02/2014 05/01/14   Ethelda ChickMartha K Linker, MD  SUMAtriptan (IMITREX) 6 MG/0.5ML SOLN injection Inject 6 mg into the skin  every 2 (two) hours as needed for migraine or headache. May repeat in 2 hours if headache persists or recurs.    Historical Provider, MD   Triage Vitals: BP 96/75 mmHg  Pulse 68  Temp(Src) 98.3 F (36.8 C) (Oral)  Resp 16  Ht  (1.626 m)  Wt 120 lb (54.432 kg)  BMI 20.59 kg/m2  SpO2 100%  LMP 06/21/2014 (Approximate)  Physical Exam  Constitutional: She is oriented to person, place, and time. She appears well-developed and well-nourished. No distress.  HENT:  Head: Normocephalic and atraumatic.  Right Ear: Hearing normal.  Left Ear: Hearing normal.  Nose: Nose  normal.  Mouth/Throat: Oropharynx is clear and moist and mucous membranes are normal.  Eyes: Conjunctivae and EOM are normal. Pupils are equal, round, and reactive to light.  Neck: Normal range of motion. Neck supple.  Cardiovascular: Regular rhythm, S1 normal and S2 normal.  Exam reveals no gallop and no friction rub.   No murmur heard. Pulmonary/Chest: Effort normal and breath sounds normal. No respiratory distress. She exhibits no tenderness.  Abdominal: Soft. Normal appearance and bowel sounds are normal. There is no hepatosplenomegaly. There is no tenderness. There is no rebound, no guarding, no tenderness at McBurney's point and negative Murphy's sign. No hernia.  Lower abdominal tenderness with more tenderness on right side than left.  Musculoskeletal: Normal range of motion.  Neurological: She is alert and oriented to person, place, and time. She has normal strength. No cranial nerve deficit or sensory deficit. Coordination normal. GCS eye subscore is 4. GCS verbal subscore is 5. GCS motor subscore is 6.  Skin: Skin is warm, dry and intact. No rash noted. No cyanosis.  Psychiatric: She has a normal mood and affect. Her speech is normal and behavior is normal. Thought content normal.  Nursing note and vitals reviewed.  ED Course  Procedures (including critical care time)  DIAGNOSTIC STUDIES: Oxygen Saturation is 100% on RA, normal by my interpretation.    COORDINATION OF CARE: 5:12 PM- Discussed plans to order diagnostic CT of abdomen, lab work, urinalysis and pregnancy test. Will give patient IV fluids. Pt advised of plan for treatment and pt agrees.  Labs Review Labs Reviewed  CBC WITH DIFFERENTIAL/PLATELET - Abnormal; Notable for the following:    RBC 3.37 (*)    Hemoglobin 7.8 (*)    HCT 24.4 (*)    MCV 72.4 (*)    MCH 23.1 (*)    RDW 15.8 (*)    Platelets 426 (*)    All other components within normal limits  COMPREHENSIVE METABOLIC PANEL - Abnormal; Notable for the  following:    Sodium 134 (*)    Potassium 2.9 (*)    Glucose, Bld 102 (*)    Anion gap 3 (*)    All other components within normal limits  URINALYSIS, ROUTINE W REFLEX MICROSCOPIC  PREGNANCY, URINE  LIPASE, BLOOD  OCCULT BLOOD X 1 CARD TO LAB, STOOL  I-STAT CG4 LACTIC ACID, ED   Imaging Review Ct Abdomen Pelvis W Contrast  07/09/2014   CLINICAL DATA:  Right lower quadrant abdominal pain beginning 3 days ago. Bloody and watery diarrhea. Nausea.  EXAM: CT ABDOMEN AND PELVIS WITH CONTRAST  TECHNIQUE: Multidetector CT imaging of the abdomen and pelvis was performed using the standard protocol following bolus administration of intravenous contrast.  CONTRAST:  25mL OMNIPAQUE IOHEXOL 300 MG/ML SOLN, OMNIPAQUE IOHEXOL 300 MG/ML SOLN  COMPARISON:  03/14/2013  FINDINGS: Minimal dependent atelectasis is present in the  lung bases. The liver, gallbladder, spleen, adrenal glands, kidneys, and pancreas have an unremarkable enhanced appearance.  There is no evidence of bowel obstruction. There is moderate, diffuse wall thickening involving the cecum and ascending colon with mild surrounding inflammatory fat stranding. The transverse and descending colon are underdistended, however there also appears to be diffuse wall thickening involving these segments as well. Mild inflammatory stranding is present about the descending colon. The appendix is mildly prominent proximally, measuring up to 10 mm in diameter, however it appears completely decompressed more distally without significant inflammation seen immediately adjacent to it and acute appendicitis is felt unlikely.  Subcentimeter retroperitoneal lymph nodes are stable to minimally more prominent than on the prior study. There is trace free fluid in the pelvis. No gross pelvic mass is identified. No acute osseous abnormality is identified.  IMPRESSION: Relatively diffuse colonic wall thickening, greatest in the ascending colon and consistent with colitis.    Electronically Signed   By: Sebastian Ache   On: 07/09/2014 18:40     EKG Interpretation None     MDM   Final diagnoses:  Lower abdominal pain  Colitis  Hypokalemia    Patient presents to the ER for evaluation of lower abdominal pain, nausea without vomiting, diarrhea. Symptoms present for 3 days. Today patient noticed blood mixed with the stool. Patient is reporting diffuse cramping of the lower abdomen, right greater than left. Examination revealed tenderness without peritonitis.  CBC shows no evidence of leukocytosis, but she is anemic. Hemoglobin 7.8. LFTs normal. Analysis clear. She does have hypokalemia, administered potassium. CT scan performed to evaluate, does show diffuse colitis. I discussed the findings with the patient. I have recommended hospitalization to monitor hemoglobin. Patient very well could require transfusion if she continues to bleed, is borderline for transfusion currently. She declined admission. I had multiple conversations with her about this. She understands the risk of further bleeding, worsening anemia. She can become hypotensive, pass out, she is at risk for death. She understands this, would prefer to be discharged with treatment, return to the ER for admission if she worsens. Patient given Cipro and Flagyl here in the ER, will continue as an outpatient.  I personally performed the services described in this documentation, which was scribed in my presence. The recorded information has been reviewed and is accurate.  Gilda Crease, MD 07/09/14 [phone]

## 2014-07-09 NOTE — ED Notes (Signed)
Pt reports 3 days of abd pain, nausea.  Reports some blood mixed in stool but not constipated.

## 2014-07-10 ENCOUNTER — Encounter (HOSPITAL_COMMUNITY): Payer: Self-pay | Admitting: Emergency Medicine

## 2014-07-10 ENCOUNTER — Inpatient Hospital Stay (HOSPITAL_COMMUNITY)
Admission: EM | Admit: 2014-07-10 | Discharge: 2014-07-13 | DRG: 392 | Disposition: A | Discharge status: Home / Self Care | Payer: PRIVATE HEALTH INSURANCE | Attending: Internal Medicine | Admitting: Internal Medicine

## 2014-07-10 ENCOUNTER — Emergency Department (HOSPITAL_COMMUNITY)
Admission: EM | Admit: 2014-07-10 | Discharge: 2014-07-10 | Discharge status: Against Medical Advice | Payer: PRIVATE HEALTH INSURANCE | Source: Home / Self Care

## 2014-07-10 DIAGNOSIS — D649 Anemia, unspecified: Secondary | ICD-10-CM

## 2014-07-10 DIAGNOSIS — D509 Iron deficiency anemia, unspecified: Secondary | ICD-10-CM | POA: Diagnosis present

## 2014-07-10 DIAGNOSIS — G43909 Migraine, unspecified, not intractable, without status migrainosus: Secondary | ICD-10-CM | POA: Diagnosis present

## 2014-07-10 DIAGNOSIS — J45909 Unspecified asthma, uncomplicated: Secondary | ICD-10-CM | POA: Insufficient documentation

## 2014-07-10 DIAGNOSIS — Z8711 Personal history of peptic ulcer disease: Secondary | ICD-10-CM | POA: Diagnosis not present

## 2014-07-10 DIAGNOSIS — R1030 Lower abdominal pain, unspecified: Secondary | ICD-10-CM | POA: Insufficient documentation

## 2014-07-10 DIAGNOSIS — Z79899 Other long term (current) drug therapy: Secondary | ICD-10-CM

## 2014-07-10 DIAGNOSIS — Z720 Tobacco use: Secondary | ICD-10-CM | POA: Insufficient documentation

## 2014-07-10 DIAGNOSIS — Z8249 Family history of ischemic heart disease and other diseases of the circulatory system: Secondary | ICD-10-CM | POA: Diagnosis not present

## 2014-07-10 DIAGNOSIS — K529 Noninfective gastroenteritis and colitis, unspecified: Secondary | ICD-10-CM | POA: Diagnosis present

## 2014-07-10 DIAGNOSIS — E876 Hypokalemia: Secondary | ICD-10-CM | POA: Diagnosis present

## 2014-07-10 DIAGNOSIS — G8929 Other chronic pain: Secondary | ICD-10-CM | POA: Insufficient documentation

## 2014-07-10 DIAGNOSIS — Z8379 Family history of other diseases of the digestive system: Secondary | ICD-10-CM

## 2014-07-10 DIAGNOSIS — K219 Gastro-esophageal reflux disease without esophagitis: Secondary | ICD-10-CM | POA: Diagnosis present

## 2014-07-10 DIAGNOSIS — F17210 Nicotine dependence, cigarettes, uncomplicated: Secondary | ICD-10-CM | POA: Diagnosis present

## 2014-07-10 DIAGNOSIS — F17200 Nicotine dependence, unspecified, uncomplicated: Secondary | ICD-10-CM | POA: Diagnosis present

## 2014-07-10 DIAGNOSIS — R197 Diarrhea, unspecified: Secondary | ICD-10-CM | POA: Diagnosis present

## 2014-07-10 LAB — COMPREHENSIVE METABOLIC PANEL
ALK PHOS: 61 U/L (ref 39–117)
ALT: 10 U/L (ref 0–35)
AST: 20 U/L (ref 0–37)
Albumin: 3.7 g/dL (ref 3.5–5.2)
Anion gap: 8 (ref 5–15)
BUN: 8 mg/dL (ref 6–23)
CALCIUM: 9 mg/dL (ref 8.4–10.5)
CO2: 24 mmol/L (ref 19–32)
CREATININE: 0.55 mg/dL (ref 0.50–1.10)
Chloride: 108 mmol/L (ref 96–112)
Glucose, Bld: 94 mg/dL (ref 70–99)
POTASSIUM: 3.5 mmol/L (ref 3.5–5.1)
Sodium: 140 mmol/L (ref 135–145)
Total Protein: 6.5 g/dL (ref 6.0–8.3)

## 2014-07-10 LAB — CBC
HCT: 21.9 % — ABNORMAL LOW (ref 36.0–46.0)
HEMOGLOBIN: 7.1 g/dL — AB (ref 12.0–15.0)
MCH: 23.6 pg — AB (ref 26.0–34.0)
MCHC: 32.4 g/dL (ref 30.0–36.0)
MCV: 72.8 fL — ABNORMAL LOW (ref 78.0–100.0)
PLATELETS: 331 10*3/uL (ref 150–400)
RBC: 3.01 MIL/uL — AB (ref 3.87–5.11)
RDW: 16.1 % — ABNORMAL HIGH (ref 11.5–15.5)
WBC: 5.9 10*3/uL (ref 4.0–10.5)

## 2014-07-10 LAB — URINALYSIS, ROUTINE W REFLEX MICROSCOPIC
Bilirubin Urine: NEGATIVE
GLUCOSE, UA: NEGATIVE mg/dL
Hgb urine dipstick: NEGATIVE
KETONES UR: NEGATIVE mg/dL
LEUKOCYTES UA: NEGATIVE
NITRITE: NEGATIVE
PROTEIN: NEGATIVE mg/dL
SPECIFIC GRAVITY, URINE: 1.014 (ref 1.005–1.030)
Urobilinogen, UA: 1 mg/dL (ref 0.0–1.0)
pH: 7 (ref 5.0–8.0)

## 2014-07-10 LAB — CBC WITH DIFFERENTIAL/PLATELET
BASOS ABS: 0 10*3/uL (ref 0.0–0.1)
BASOS PCT: 1 % (ref 0–1)
EOS PCT: 1 % (ref 0–5)
Eosinophils Absolute: 0.1 10*3/uL (ref 0.0–0.7)
HCT: 23.6 % — ABNORMAL LOW (ref 36.0–46.0)
Hemoglobin: 7.6 g/dL — ABNORMAL LOW (ref 12.0–15.0)
Lymphocytes Relative: 34 % (ref 12–46)
Lymphs Abs: 1.9 10*3/uL (ref 0.7–4.0)
MCH: 23.5 pg — ABNORMAL LOW (ref 26.0–34.0)
MCHC: 32.2 g/dL (ref 30.0–36.0)
MCV: 73.1 fL — ABNORMAL LOW (ref 78.0–100.0)
MONO ABS: 0.6 10*3/uL (ref 0.1–1.0)
MONOS PCT: 11 % (ref 3–12)
Neutro Abs: 3 10*3/uL (ref 1.7–7.7)
Neutrophils Relative %: 53 % (ref 43–77)
Platelets: 387 10*3/uL (ref 150–400)
RBC: 3.23 MIL/uL — ABNORMAL LOW (ref 3.87–5.11)
RDW: 16 % — ABNORMAL HIGH (ref 11.5–15.5)
WBC: 5.7 10*3/uL (ref 4.0–10.5)

## 2014-07-10 LAB — POC OCCULT BLOOD, ED: FECAL OCCULT BLD: NEGATIVE

## 2014-07-10 LAB — LIPASE, BLOOD: Lipase: 28 U/L (ref 11–59)

## 2014-07-10 LAB — SEDIMENTATION RATE: Sed Rate: 14 mm/hr (ref 0–22)

## 2014-07-10 MED ORDER — SUMATRIPTAN SUCCINATE 6 MG/0.5ML ~~LOC~~ SOLN
6.0000 mg | SUBCUTANEOUS | Status: DC | PRN
  Administered 2014-07-12: 6 mg via SUBCUTANEOUS
  Filled 2014-07-10 (×2): qty 0.5

## 2014-07-10 MED ORDER — METRONIDAZOLE IN NACL 5-0.79 MG/ML-% IV SOLN
500.0000 mg | Freq: Three times a day (TID) | INTRAVENOUS | Status: DC
  Administered 2014-07-10 – 2014-07-13 (×9): 500 mg via INTRAVENOUS
  Filled 2014-07-10 (×10): qty 100

## 2014-07-10 MED ORDER — ONDANSETRON HCL 4 MG/2ML IJ SOLN
4.0000 mg | Freq: Four times a day (QID) | INTRAMUSCULAR | Status: DC | PRN
  Administered 2014-07-10 – 2014-07-11 (×2): 4 mg via INTRAVENOUS
  Filled 2014-07-10 (×2): qty 2

## 2014-07-10 MED ORDER — PANTOPRAZOLE SODIUM 40 MG PO TBEC
40.0000 mg | DELAYED_RELEASE_TABLET | Freq: Every day | ORAL | Status: DC
  Administered 2014-07-10: 40 mg via ORAL
  Filled 2014-07-10 (×2): qty 1

## 2014-07-10 MED ORDER — ONDANSETRON HCL 4 MG PO TABS
4.0000 mg | ORAL_TABLET | Freq: Four times a day (QID) | ORAL | Status: DC | PRN
Start: 2014-07-10 — End: 2014-07-13

## 2014-07-10 MED ORDER — ALUM & MAG HYDROXIDE-SIMETH 200-200-20 MG/5ML PO SUSP: 30.0000 mL | Freq: Four times a day (QID) | ORAL | Status: DC | PRN

## 2014-07-10 MED ORDER — SODIUM CHLORIDE 0.9 % IV SOLN
INTRAVENOUS | Status: DC
  Administered 2014-07-10 – 2014-07-12 (×3): via INTRAVENOUS

## 2014-07-10 MED ORDER — CIPROFLOXACIN IN D5W 400 MG/200ML IV SOLN
400.0000 mg | Freq: Two times a day (BID) | INTRAVENOUS | Status: DC
  Administered 2014-07-10 – 2014-07-13 (×6): 400 mg via INTRAVENOUS
  Filled 2014-07-10 (×8): qty 200

## 2014-07-10 MED ORDER — MORPHINE SULFATE 2 MG/ML IJ SOLN
1.0000 mg | INTRAMUSCULAR | Status: DC | PRN
Start: 2014-07-10 — End: 2014-07-13
  Administered 2014-07-10 – 2014-07-11 (×2): 1 mg via INTRAVENOUS
  Filled 2014-07-10 (×2): qty 1

## 2014-07-10 MED ORDER — OXYCODONE HCL 5 MG PO TABS
5.0000 mg | ORAL_TABLET | ORAL | Status: DC | PRN
  Administered 2014-07-11 – 2014-07-12 (×3): 5 mg via ORAL
  Filled 2014-07-10 (×3): qty 1

## 2014-07-10 MED ORDER — NICOTINE 7 MG/24HR TD PT24
7.0000 mg | MEDICATED_PATCH | Freq: Every day | TRANSDERMAL | Status: DC
  Administered 2014-07-10 – 2014-07-12 (×3): 7 mg via TRANSDERMAL
  Filled 2014-07-10 (×4): qty 1

## 2014-07-10 NOTE — ED Notes (Addendum)
Pt left [facility] due to wait and "nasty attitudes" [facility]/o abd pain and blood in stools. [facility], nausea and constipation on 1/24.

## 2014-07-10 NOTE — ED Notes (Signed)
Pt alert and oriented x4. Respirations even and unlabored, bilateral symmetrical rise and fall of chest. Skin warm and dry. In no acute distress. Denies needs.   

## 2014-07-10 NOTE — ED Notes (Signed)
md at bedside

## 2014-07-10 NOTE — ED Notes (Signed)
Pt c/o abd pain and abnormal labs with low hemoglobin; pt sts she is not going to wait and is leaving

## 2014-07-10 NOTE — ED Provider Notes (Signed)
CSN: [id number]     Arrival date & time 07/10/14  1519 History   First MD Initiated Contact with Patient 07/10/14 1758     Chief Complaint  Patient presents with  . Abdominal Pain  . Blood In Stools     Patient is a 44 y.o. female presenting with abdominal pain. The history is provided by the patient. No language interpreter was used.  Abdominal Pain  Jenny Evans presents for abdominal pain and bloody stools. She states she's had 4 days of sharp lower abdominal pain.she has 3-4 bloody bowel movements per day at home. Her pain is worse with movement and associated just before bowel movements. She reports that there is bright red and dark red blood mixed with mucus. She denies any fevers, vomiting, dysuria. She does have nausea.  She was seen at [facility] yesterday for similar symptoms and was recommended to be admitted at that time and patient declined. She comes in today because she has continued pain and bloody stools. Symptoms are moderate, constant, worsening.  Past Medical History  Diagnosis Date  . Migraine   . GERD (gastroesophageal reflux disease)   . Ulcer   . Toothache   . Chronic pain   . Bronchitis   . Sickle cell trait   . Asthma    Past Surgical History  Procedure Laterality Date  . Myringotomy      age 44  . Esophagogastroduodenoscopy (egd) with propofol N/A 04/04/2013    Procedure: ESOPHAGOGASTRODUODENOSCOPY (EGD) WITH PROPOFOL;  Surgeon: Willis ModenaWilliam Outlaw, MD;  Location: [facility];  Service: Endoscopy;  Laterality: N/A;  . Examination under anesthesia N/A 08/10/2013    Procedure: RECTAL EXAM UNDER ANESTHESIA;  Surgeon: Clovis Puhomas A. Cornett, MD;  Location: [facility];  Service: General;  Laterality: N/A;  ligation   Family History  Problem Relation Age of Onset  . Heart disease Mother   . Kidney disease Mother   . Hypertension Mother   . COPD Mother   . Diabetes Mother   . Heart disease Father    History  Substance Use Topics  .  Smoking status: Current Every Day Smoker -- 0.50 packs/day for 15 years    Types: Cigarettes  . Smokeless tobacco: Never Used  . Alcohol Use: Yes     Comment: very rare   OB History    No data available     Review of Systems  Gastrointestinal: Positive for abdominal pain.  All other systems reviewed and are negative.     Allergies  Hydrocodone-acetaminophen  Home Medications   Prior to Admission medications   Medication Sig Start Date End Date Taking? Authorizing Provider  ciprofloxacin (CIPRO) 500 MG tablet Take 1 tablet (500 mg total) by mouth 2 (two) times daily. 07/09/14  Yes Gilda Creasehristopher J. Pollina, MD  esomeprazole (NEXIUM) 40 MG capsule Take 1 capsule (40 mg total) by mouth 2 (two) times daily before a meal. 04/04/13  Yes Willis ModenaWilliam Outlaw, MD  metroNIDAZOLE (FLAGYL) 500 MG tablet Take 1 tablet (500 mg total) by mouth 3 (three) times daily. 07/09/14  Yes Gilda Creasehristopher J. Pollina, MD  SUMAtriptan (IMITREX) 6 MG/0.5ML SOLN injection Inject 6 mg into the skin every 2 (two) hours as needed for migraine or headache (migraine). May repeat in 2 hours if headache persists or recurs.   Yes Historical Provider, MD  oxyCODONE-acetaminophen (PERCOCET) 5-325 MG per tablet Take 1 tablet by mouth every 4 (four) hours as needed. 07/09/14   Gilda Creasehristopher J. Pollina, MD  potassium chloride  SA (K-DUR,KLOR-CON) 20 MEQ tablet Take 2 tablets (40 mEq total) by mouth daily. 07/09/14   Gilda Crease, MD   BP 111/70 mmHg  Pulse 93  Temp(Src) 97.5 F (36.4 C) (Oral)  Resp 16  SpO2 100%  LMP 06/21/2014 (Approximate) Physical Exam  Constitutional: She is oriented to person, place, and time. She appears well-developed and well-nourished.  HENT:  Head: Normocephalic and atraumatic.  Cardiovascular: Normal rate and regular rhythm.   No murmur heard. Pulmonary/Chest: Effort normal and breath sounds normal. No respiratory distress.  Abdominal: Soft. There is no rebound and no guarding.  Moderate  lower abdominal tenderness without guarding or rebound  Genitourinary:  No external hemorrhoids. Mucous present on rectal exam with no gross blood  Musculoskeletal: She exhibits no edema or tenderness.  Neurological: She is alert and oriented to person, place, and time.  Skin: Skin is warm and dry.  Psychiatric: She has a normal mood and affect. Her behavior is normal.  Nursing note and vitals reviewed.   ED Course  Procedures (including critical care time) Labs Review Labs Reviewed  CBC WITH DIFFERENTIAL/PLATELET - Abnormal; Notable for the following:    RBC 3.23 (*)    Hemoglobin 7.6 (*)    HCT 23.6 (*)    MCV 73.1 (*)    MCH 23.5 (*)    RDW 16.0 (*)    All other components within normal limits  COMPREHENSIVE METABOLIC PANEL - Abnormal; Notable for the following:    Total Bilirubin <0.1 (*)    All other components within normal limits  LIPASE, BLOOD  URINALYSIS, ROUTINE W REFLEX MICROSCOPIC  POC OCCULT BLOOD, ED    Imaging Review Ct Abdomen Pelvis W Contrast  07/09/2014   CLINICAL DATA:  Right lower quadrant abdominal pain beginning 3 days ago. Bloody and watery diarrhea. Nausea.  EXAM: CT ABDOMEN AND PELVIS WITH CONTRAST  TECHNIQUE: Multidetector CT imaging of the abdomen and pelvis was performed using the standard protocol following bolus administration of intravenous contrast.  CONTRAST:  25mL OMNIPAQUE IOHEXOL 300 MG/ML SOLN, OMNIPAQUE IOHEXOL 300 MG/ML SOLN  COMPARISON:  03/14/2013  FINDINGS: Minimal dependent atelectasis is present in the lung bases. The liver, gallbladder, spleen, adrenal glands, kidneys, and pancreas have an unremarkable enhanced appearance.  There is no evidence of bowel obstruction. There is moderate, diffuse wall thickening involving the cecum and ascending colon with mild surrounding inflammatory fat stranding. The transverse and descending colon are underdistended, however there also appears to be diffuse wall thickening involving these segments  as well. Mild inflammatory stranding is present about the descending colon. The appendix is mildly prominent proximally, measuring up to 10 mm in diameter, however it appears completely decompressed more distally without significant inflammation seen immediately adjacent to it and acute appendicitis is felt unlikely.  Subcentimeter retroperitoneal lymph nodes are stable to minimally more prominent than on the prior study. There is trace free fluid in the pelvis. No gross pelvic mass is identified. No acute osseous abnormality is identified.  IMPRESSION: Relatively diffuse colonic wall thickening, greatest in the ascending colon and consistent with colitis.   Electronically Signed   By: Sebastian Ache   On: 07/09/2014 18:40     EKG Interpretation None      MDM   Final diagnoses:  Colitis  Anemia, unspecified anemia type    Patient presents for low abdominal pain and bright red blood per rectum. Patient with mucus present and the rectal vault, CBC demonstrates anemia which is similar to yesterday's.  Reviewed ED visit and CT scan from yesterday. Patient does have evidence of colitis. Question whether this may be ulcerative colitis given her symptoms. Discussed with medicine regarding admission for further evaluation.    Tilden Fossa, MD 07/10/14 1944

## 2014-07-10 NOTE — H&P (Signed)
[facility] History and Physical  RICHEL MILLSPAUGH KCL:[id number] DOB: March 23, 1971 DOA: 07/10/2014  Referring physician: ED physician PCP: No PCP Per Patient   Chief Complaint: abdominal pain, diarrhea  HPI:  Ms. Dalporto is a 44yo woman with PMH of migraine, stomach ulcer, GERD, asthma who presents with 5 day history of abdominal pain and diarrhea.  Ms. Ledin reports that her symptoms started at work on Thursday of this past week.  She does not note any preceding illness or change in diet that day.  She has no sick contacts.  She reports that the abdominal pain is peri-umbilical and throbbing in nature.  The pain is constant and 10/10 at it's worst.  It is better with taking a hot bath and a little better with defecation.  Associated symptoms include feeling cold, nausea without emesis and diarrhea.  She reports the diarrhea is watery and occurs about 3-4 times per day.  She does have some urgency.  She notices bright red to dark blood in the stool.  She has a history of hemorrhoids, but these symptoms are very different compared to her hemorrhoids.  She denies recent travel, Abx use, STDs, h/o radiation.  She does use a good amount of NSAIDs for her migraines, about 2-3 goody powder's a week.  She has a history of gastric ulcer and GERD.    She has never had an episode like this, her maternal aunt has Crohn's disease and she thinks her mom has IBS.   She is a current every day smoker, about 4-5 cigarettes per day.    She was seen in the ED yesterday and given an Rx for cipro/flagyl, percocet but she did not pick these up.   Assessment and Plan: Colitis: Likely causes include NSAID induced, food borne illness, viral or this could be an initial presentation of UC/CD given family history of Crohn's disease.  Diagnosis of this would be persistent diarrhea for > 4 weeks with bloody stools along with a waxing and waning course.  This is her first presentation and has only been ongoing for 5 days  now - Cipro/Flagyl IV for 1 dose - IVF with NS overnight for hydration - Regular diet - Zofran for nausea - Oxycodone and morphine for pain, quickly de-escalate - Bowel regimen if she becomes constipated (currently with diarrhea) - FOBT as noted below - ESR/CRP - Stool studies, GI pathogen panel, Stool culture, O&P - Check HIV  Anemia, likely related to blood loss from the bowel - FOBT  - Trend CBC, tonight and in the AM - PRBC for Hgb < 7.0 (discussed with patient)   Migraine - Takes imitrex at home, would advise her to discontinue use of NSAIDs as she has a history of ulcer disease and now colitis    GERD (gastroesophageal reflux disease) - Continue PPI, protonix while in house - Advise daily nexium in the outpatient setting    Tobacco use - She only smokes 4-5 cigarettes per day, defer nicotine patch unless she begins to have symptoms of withdrawal - Advise cessation.   Radiological Exams on Admission: Ct Abdomen Pelvis W Contrast  07/09/2014   CLINICAL DATA:  Right lower quadrant abdominal pain beginning 3 days ago. Bloody and watery diarrhea. Nausea.  EXAM: CT ABDOMEN AND PELVIS WITH CONTRAST  TECHNIQUE: Multidetector CT imaging of the abdomen and pelvis was performed using the standard protocol following bolus administration of intravenous contrast.  CONTRAST:  81m OMNIPAQUE IOHEXOL 300 MG/ML SOLN, 1088mOMNIPAQUE IOHEXOL 300 MG/ML SOLN  COMPARISON:  03/14/2013  FINDINGS: Minimal dependent atelectasis is present in the lung bases. The liver, gallbladder, spleen, adrenal glands, kidneys, and pancreas have an unremarkable enhanced appearance.  There is no evidence of bowel obstruction. There is moderate, diffuse wall thickening involving the cecum and ascending colon with mild surrounding inflammatory fat stranding. The transverse and descending colon are underdistended, however there also appears to be diffuse wall thickening involving these segments as well. Mild inflammatory  stranding is present about the descending colon. The appendix is mildly prominent proximally, measuring up to 10 mm in diameter, however it appears completely decompressed more distally without significant inflammation seen immediately adjacent to it and acute appendicitis is felt unlikely.  Subcentimeter retroperitoneal lymph nodes are stable to minimally more prominent than on the prior study. There is trace free fluid in the pelvis. No gross pelvic mass is identified. No acute osseous abnormality is identified.  IMPRESSION: Relatively diffuse colonic wall thickening, greatest in the ascending colon and consistent with colitis.   Electronically Signed   By: Logan Bores   On: 07/09/2014 18:40    Code Status: Full Family Communication: Pt and patient's girlfriend at bedside Disposition Plan: Admit for further evaluation      Review of Systems:  Constitutional: Negative for fever, chills and malaise/fatigue. Negative for diaphoresis.  HENT: Negative for hearing loss, ear pain, nosebleeds, congestion Eyes: + for occasional eye pain due to glasses. Negative for blurred vision, double vision Respiratory: Negative for cough, hemoptysis, sputum production, shortness of breath, wheezing and stridor.   Cardiovascular: Negative for chest pain, palpitations, orthopnea, claudication and leg swelling.  Gastrointestinal: + for abdominal pain, diarrhea, blood in stool, nausea, occasional constipation and heartburn. Negative for  vomiting  Negative for melena.  Genitourinary: Negative for dysuria, urgency, frequency, hematuria and flank pain.  Musculoskeletal: Negative for myalgias, back pain, joint pain and falls.  Skin: Negative for itching and rash.  Neurological: Negative for dizziness and weakness. Negative for tingling, tremors, sensory change, speech change, focal weakness, loss of consciousness and headaches.  Endo/Heme/Allergies: Negative for environmental allergies and polydipsia. Does not  bruise/bleed easily.  Psychiatric/Behavioral: Negative for suicidal ideas. The patient is not nervous/anxious.      Past Medical History  Diagnosis Date  . Migraine   . GERD (gastroesophageal reflux disease)   . Ulcer   . Toothache   . Chronic pain   . Bronchitis   . Sickle cell trait   . Asthma     Past Surgical History  Procedure Laterality Date  . Myringotomy      age 73  . Esophagogastroduodenoscopy (egd) with propofol N/A 04/04/2013    Procedure: ESOPHAGOGASTRODUODENOSCOPY (EGD) WITH PROPOFOL;  Surgeon: Arta Silence, MD;  Location: [facility];  Service: Endoscopy;  Laterality: N/A;  . Examination under anesthesia N/A 08/10/2013    Procedure: RECTAL EXAM UNDER ANESTHESIA;  Surgeon: Joyice Faster. Cornett, MD;  Location: [facility];  Service: General;  Laterality: N/A;  ligation    Social History:  reports that she has been smoking Cigarettes.  She has a 7.5 pack-year smoking history. She has never used smokeless tobacco. She reports that she drinks alcohol. She reports that she does not use illicit drugs.  Allergies  Allergen Reactions  . Hydrocodone-Acetaminophen Nausea And Vomiting    Patient can tolerate acetaminophen    Family History  Problem Relation Age of Onset  . Heart disease Mother   . Kidney disease Mother   . Hypertension Mother   . COPD  Mother   . Diabetes Mother   . Heart disease Father   . Crohn's disease Maternal Aunt     Prior to Admission medications   Medication Sig Start Date End Date Taking? Authorizing Provider         esomeprazole (NEXIUM) 40 MG capsule Take 1 capsule (40 mg total) by mouth 2 (two) times daily before a meal. 04/04/13  Yes Arta Silence, MD         SUMAtriptan (IMITREX) 6 MG/0.5ML SOLN injection Inject 6 mg into the skin every 2 (two) hours as needed for migraine or headache (migraine). May repeat in 2 hours if headache persists or recurs.   Yes Historical Provider, MD                  Physical  Exam: Filed Vitals:   07/10/14 1528 07/10/14 1754 07/10/14 1959 07/10/14 2047  BP: 114/73 111/70 107/66 102/51  Pulse: 99 93 82 74  Temp: 98.2 F (36.8 C) 97.5 F (36.4 C)  98 F (36.7 C)  TempSrc: Oral Oral  Oral  Resp: 20 16 16 16   SpO2: 100% 100% 100% 97%    Physical Exam  Constitutional: Appears well-developed and well-nourished. No distress.  HENT: Normocephalic.  Oropharynx is clear and moist.  Eyes: Conjunctivae and EOM are normal. PERRLA, no scleral icterus. She has muddy sclerae, this is her baseline.   Neck: Normal ROM. Neck supple.  CVS: RR, NR, S1/S2 +, no murmurs noted Pulmonary: Effort and breath sounds normal, no stridor, rhonchi, wheezes, rales.  Abdominal: Soft. BS hyperactive,  no distension, + tenderness around the umbilicus, no suprapubic tenderness Musculoskeletal: No edema and no tenderness.  Lymphadenopathy: No lymphadenopathy noted, cervical Neuro: Alert. Moving all extremities, no acute deficit Skin: Skin is warm and dry. No rash noted. Not diaphoretic. No erythema. No pallor. She has multiple tattoos, no new reported.  Psychiatric: Normal mood and affect. Behavior, judgment, thought content normal.   Labs on Admission:  Basic Metabolic Panel:  Recent Labs Lab 07/09/14 1720 07/10/14 1545  NA 134* 140  K 2.9* 3.5  CL 105 108  CO2 26 24  GLUCOSE 102* 94  BUN 7 8  CREATININE 0.52 0.55  CALCIUM 8.6 9.0   Liver Function Tests:  Recent Labs Lab 07/09/14 1720 07/10/14 1545  AST 20 20  ALT 12 10  ALKPHOS 67 61  BILITOT 0.3 <0.1*  PROT 6.9 6.5  ALBUMIN 3.7 3.7    Recent Labs Lab 07/09/14 1720 07/10/14 1545  LIPASE 29 28  CBC:  Recent Labs Lab 07/09/14 1720 07/10/14 1545  WBC 6.3 5.7  NEUTROABS 3.4 3.0  HGB 7.8* 7.6*  HCT 24.4* 23.6*  MCV 72.4* 73.1*  PLT 426* 387    MULLEN, EMILY, MD [phone]  If 7PM-7AM, please contact night-coverage [email or web address] Password [facility] 07/10/2014, 8:55 PM

## 2014-07-11 DIAGNOSIS — K210 Gastro-esophageal reflux disease with esophagitis: Secondary | ICD-10-CM

## 2014-07-11 LAB — COMPREHENSIVE METABOLIC PANEL
ALT: 10 U/L (ref 0–35)
AST: 16 U/L (ref 0–37)
Albumin: 3.3 g/dL — ABNORMAL LOW (ref 3.5–5.2)
Alkaline Phosphatase: 54 U/L (ref 39–117)
Anion gap: 3 — ABNORMAL LOW (ref 5–15)
BILIRUBIN TOTAL: 0.3 mg/dL (ref 0.3–1.2)
BUN: 6 mg/dL (ref 6–23)
CHLORIDE: 107 mmol/L (ref 96–112)
CO2: 26 mmol/L (ref 19–32)
Calcium: 8.3 mg/dL — ABNORMAL LOW (ref 8.4–10.5)
Creatinine, Ser: 0.46 mg/dL — ABNORMAL LOW (ref 0.50–1.10)
GFR calc Af Amer: >90 mL/min (ref >90)
GFR calc non Af Amer: >90 mL/min (ref >90)
GLUCOSE: 92 mg/dL (ref 70–99)
Potassium: 3.2 mmol/L — ABNORMAL LOW (ref 3.5–5.1)
Sodium: 136 mmol/L (ref 135–145)
TOTAL PROTEIN: 5.5 g/dL — AB (ref 6.0–8.3)

## 2014-07-11 LAB — CBC
HCT: 21.8 % — ABNORMAL LOW (ref 36.0–46.0)
Hemoglobin: 7 g/dL — ABNORMAL LOW (ref 12.0–15.0)
MCH: 23.4 pg — ABNORMAL LOW (ref 26.0–34.0)
MCHC: 32.1 g/dL (ref 30.0–36.0)
MCV: 72.9 fL — AB (ref 78.0–100.0)
PLATELETS: 328 10*3/uL (ref 150–400)
RBC: 2.99 MIL/uL — ABNORMAL LOW (ref 3.87–5.11)
RDW: 16.2 % — ABNORMAL HIGH (ref 11.5–15.5)
WBC: 5.1 10*3/uL (ref 4.0–10.5)

## 2014-07-11 LAB — C-REACTIVE PROTEIN

## 2014-07-11 LAB — ABO/RH: ABO/RH(D): A POS

## 2014-07-11 LAB — FOLATE: Folate: 10.2 ng/mL

## 2014-07-11 LAB — PREPARE RBC (CROSSMATCH)

## 2014-07-11 LAB — VITAMIN B12: Vitamin B-12: 679 pg/mL (ref 211–911)

## 2014-07-11 LAB — IRON AND TIBC
Iron: 10 ug/dL — ABNORMAL LOW (ref 42–145)
SATURATION RATIOS: 3 % — AB (ref 20–55)
TIBC: 312 ug/dL (ref 250–470)
UIBC: 302 ug/dL (ref 125–400)

## 2014-07-11 LAB — RETICULOCYTES
RBC.: 3.12 MIL/uL — ABNORMAL LOW (ref 3.87–5.11)
Retic Count, Absolute: 34.3 10*3/uL (ref 19.0–186.0)
Retic Ct Pct: 1.1 % (ref 0.4–3.1)

## 2014-07-11 LAB — FERRITIN: Ferritin: 9 ng/mL — ABNORMAL LOW (ref 10–291)

## 2014-07-11 MED ORDER — PANTOPRAZOLE SODIUM 40 MG IV SOLR
40.0000 mg | Freq: Two times a day (BID) | INTRAVENOUS | Status: DC
  Administered 2014-07-11 – 2014-07-13 (×5): 40 mg via INTRAVENOUS
  Filled 2014-07-11 (×6): qty 40

## 2014-07-11 MED ORDER — POTASSIUM CHLORIDE 10 MEQ/100ML IV SOLN
10.0000 meq | INTRAVENOUS | Status: AC
  Administered 2014-07-11 (×2): 10 meq via INTRAVENOUS
  Filled 2014-07-11 (×3): qty 100

## 2014-07-11 MED ORDER — BOOST / RESOURCE BREEZE PO LIQD
1.0000 | Freq: Three times a day (TID) | ORAL | Status: DC
  Administered 2014-07-11: 22:00:00 via ORAL
  Administered 2014-07-11 – 2014-07-13 (×6): 1 via ORAL

## 2014-07-11 MED ORDER — SODIUM CHLORIDE 0.9 % IV SOLN
Freq: Once | INTRAVENOUS | Status: DC
Start: 2014-07-11 — End: 2014-07-13

## 2014-07-11 NOTE — Progress Notes (Signed)
INITIAL NUTRITION ASSESSMENT  DOCUMENTATION CODES Per approved criteria  -Not Applicable   INTERVENTION: - Resource Breeze po TID, each supplement provides 250 kcal and 9 grams of protein - Diet advancement as medically tolerated - RD will continue to monitor  NUTRITION DIAGNOSIS: Inadequate oral intake related to abdominal pain and diarrhea as evidenced by wt loss.   Goal: Pt to meet >/= 90% of their estimated nutrition needs   Monitor:  Weight trend, I/O's, diet advancement, labs, acceptance of supplements  Reason for Assessment: MST  44 y.o. female  Admitting Dx: <principal problem not specified>  ASSESSMENT: 44yo woman with PMH of migraine, stomach ulcer, GERD, asthma who presents with 5 day history of abdominal pain and diarrhea. Jenny Evans reports that her symptoms started at work on Thursday of this past week.  - Pt with 9 lb wt loss. She had regular food for breakfast which she said caused her pain. MD downgraded diet to full liquids. Pt says that she feels hungry and wants a hamburger. GI consulted.  - Will order nutritional supplements. - No signs of fat or muscle wasting   Labs reviewed: Na low  Height: Ht Readings from Last 1 Encounters:  07/09/14  (1.626 m)    Weight: Wt Readings from Last 1 Encounters:  07/11/14 121 lb 4.8 oz (55.021 kg)    Ideal Body Weight: 54.7 kg  % Ideal Body Weight: 101%  Wt Readings from Last 10 Encounters:  07/11/14 121 lb 4.8 oz (55.021 kg)  07/09/14 120 lb (54.432 kg)  05/01/14 129 lb (58.514 kg)  01/16/14 124 lb (56.246 kg)  10/30/13 129 lb (58.514 kg)  10/11/13 129 lb (58.514 kg)  10/08/13 129 lb (58.514 kg)  09/22/13 128 lb 3.2 oz (58.151 kg)  08/26/13 128 lb (58.06 kg)  08/10/13 125 lb 4 oz (56.813 kg)   BMI:  Body mass index is 20.81 kg/(m^2).  Estimated Nutritional Needs: Kcal: 1500-1700 Protein: 70-80 g Fluid: >1.7 L/day  Skin: intact  Diet Order: Diet full liquid  EDUCATION  NEEDS: -Education needs addressed   Intake/Output Summary (Last 24 hours) at 07/11/14 1317 Last data filed at 07/11/14 1305  Gross per 24 hour  Intake    970 ml  Output    300 ml  Net    670 ml    Last BM: prior to admission   Labs:   Recent Labs Lab 07/09/14 1720 07/10/14 1545 07/11/14 0529  NA 134* 140 136  K 2.9* 3.5 3.2*  CL 105 108 107  CO2 BUN CREATININE 0.52 0.55 0.46*  CALCIUM 8.6 9.0 8.3*  GLUCOSE 102* 94 92    CBG (last 3)  No results for input(s): GLUCAP in the last 72 hours.  Scheduled Meds: . sodium chloride   Intravenous Once  . ciprofloxacin  400 mg Intravenous Q12H  . metronidazole  500 mg Intravenous Q8H  . nicotine  7 mg Transdermal Daily  . pantoprazole (PROTONIX) IV  40 mg Intravenous Q12H    Continuous Infusions: . sodium chloride 75 mL/hr at 07/10/14 2104    Past Medical History  Diagnosis Date  . Migraine   . GERD (gastroesophageal reflux disease)   . Ulcer   . Toothache   . Chronic pain   . Bronchitis   . Sickle cell trait   . Asthma     Past Surgical History  Procedure Laterality Date  . Myringotomy      age 62  .  Esophagogastroduodenoscopy (egd) with propofol N/A 04/04/2013    Procedure: ESOPHAGOGASTRODUODENOSCOPY (EGD) WITH PROPOFOL;  Surgeon: Willis ModenaWilliam Outlaw, MD;  Location: [facility];  Service: Endoscopy;  Laterality: N/A;  . Examination under anesthesia N/A 08/10/2013    Procedure: RECTAL EXAM UNDER ANESTHESIA;  Surgeon: Clovis Puhomas A. Cornett, MD;  Location: [facility];  Service: General;  Laterality: N/A;  ligation    Jenny KluverHaley Earley Grobe MS, RD, LDN

## 2014-07-11 NOTE — Consult Note (Addendum)
Referring Provider: Dr. Sunnie Nielsen Primary Care Physician:  No PCP Per Patient Primary Gastroenterologist:  Gentry Fitz  Reason for Consultation:  Diarrhea; Abdominal pain  HPI: Jenny Evans is a 44 y.o. female who had the acute onset of diffuse sharp abdominal pain and bloody diarrhea that started last Thursday. Bloody diarrhea and abdominal pain continued through the weekend. +chills. +nausea without vomiting. Denies any problems with bloody diarrhea in the past. Reports family history of Crohn's disease (maternal aunt). EGD in 2014 by Dr. Dulce Sellar that showed antral ulcers. CT showed diffuse colitis (greatest in the proximal stomach). No BMs since admit. Wants to eat. Uses Goody's powders several times per week.   Past Medical History  Diagnosis Date  . Migraine   . GERD (gastroesophageal reflux disease)   . Ulcer   . Toothache   . Chronic pain   . Bronchitis   . Sickle cell trait   . Asthma     Past Surgical History  Procedure Laterality Date  . Myringotomy      age 24  . Esophagogastroduodenoscopy (egd) with propofol N/A 04/04/2013    Procedure: ESOPHAGOGASTRODUODENOSCOPY (EGD) WITH PROPOFOL;  Surgeon: Willis Modena, MD;  Location: [facility];  Service: Endoscopy;  Laterality: N/A;  . Examination under anesthesia N/A 08/10/2013    Procedure: RECTAL EXAM UNDER ANESTHESIA;  Surgeon: Clovis Pu. Cornett, MD;  Location: [facility];  Service: General;  Laterality: N/A;  ligation    Prior to Admission medications   Medication Sig Start Date End Date Taking? Authorizing Provider  ciprofloxacin (CIPRO) 500 MG tablet Take 1 tablet (500 mg total) by mouth 2 (two) times daily. 07/09/14  Yes Gilda Crease, MD  esomeprazole (NEXIUM) 40 MG capsule Take 1 capsule (40 mg total) by mouth 2 (two) times daily before a meal. 04/04/13  Yes Willis Modena, MD  metroNIDAZOLE (FLAGYL) 500 MG tablet Take 1 tablet (500 mg total) by mouth 3 (three) times daily. 07/09/14  Yes  Gilda Crease, MD  SUMAtriptan (IMITREX) 6 MG/0.5ML SOLN injection Inject 6 mg into the skin every 2 (two) hours as needed for migraine or headache (migraine). May repeat in 2 hours if headache persists or recurs.   Yes Historical Provider, MD  oxyCODONE-acetaminophen (PERCOCET) 5-325 MG per tablet Take 1 tablet by mouth every 4 (four) hours as needed. 07/09/14   Gilda Crease, MD  potassium chloride SA (K-DUR,KLOR-CON) 20 MEQ tablet Take 2 tablets (40 mEq total) by mouth daily. 07/09/14   Gilda Crease, MD    Scheduled Meds: . sodium chloride   Intravenous Once  . ciprofloxacin  400 mg Intravenous Q12H  . feeding supplement (RESOURCE BREEZE)  1 Container Oral TID BM  . metronidazole  500 mg Intravenous Q8H  . nicotine  7 mg Transdermal Daily  . pantoprazole (PROTONIX) IV  40 mg Intravenous Q12H   Continuous Infusions: . sodium chloride 75 mL/hr at 07/10/14 2104   PRN Meds:.alum & mag hydroxide-simeth, morphine injection, ondansetron **OR** ondansetron (ZOFRAN) IV, oxyCODONE, SUMAtriptan  Allergies as of 07/10/2014 - Review Complete 07/10/2014  Allergen Reaction Noted  . Hydrocodone-acetaminophen Nausea And Vomiting     Family History  Problem Relation Age of Onset  . Heart disease Mother   . Kidney disease Mother   . Hypertension Mother   . COPD Mother   . Diabetes Mother   . Heart disease Father   . Crohn's disease Maternal Aunt     History   Social History  . Marital Status:  Single    Spouse Name: N/A    Number of Children: N/A  . Years of Education: N/A   Occupational History  . Not on file.   Social History Main Topics  . Smoking status: Current Every Day Smoker -- 0.50 packs/day for 15 years    Types: Cigarettes  . Smokeless tobacco: Never Used  . Alcohol Use: Yes     Comment: very rare  . Drug Use: No  . Sexual Activity: Yes    Birth Control/ Protection: None   Other Topics Concern  . Not on file   Social History Narrative     Review of Systems: All negative except as stated above in HPI.  Physical Exam: Vital signs: Filed Vitals:   07/11/14 1320  BP: 89/50  Pulse: 75  Temp: 98 F (36.7 C)  Resp: 16   Last BM Date: 07/10/14 General:   Alert,  Well-developed, well-nourished, pleasant and cooperative in NAD HEENT: anicteric Lungs:  Clear throughout to auscultation.   No wheezes, crackles, or rhonchi. No acute distress. Heart:  Regular rate and rhythm; no murmurs, clicks, rubs,  or gallops. Abdomen: diffusely tender with guarding, soft, nondistended, +BS  Rectal:  Deferred Ext: no edema Skin: multiple tattoos noted  GI:  Lab Results:  Recent Labs  07/10/14 1545 07/10/14 2200 07/11/14 0529  WBC 5.7 5.9 5.1  HGB 7.6* 7.1* 7.0*  HCT 23.6* 21.9* 21.8*  PLT 387 331 328   BMET  Recent Labs  07/09/14 1720 07/10/14 1545 07/11/14 0529  NA 134* 140 136  K 2.9* 3.5 3.2*  CL 105 108 107  CO2 26 24 26   GLUCOSE 102* 94 92  BUN 7 8 6   CREATININE 0.52 0.55 0.46*  CALCIUM 8.6 9.0 8.3*   LFT  Recent Labs  07/11/14 0529  PROT 5.5*  ALBUMIN 3.3*  AST 16  ALT 10  ALKPHOS 54  BILITOT 0.3   PT/INR No results for input(s): LABPROT, INR in the last 72 hours.   Studies/Results: Ct Abdomen Pelvis W Contrast  07/09/2014   CLINICAL DATA:  Right lower quadrant abdominal pain beginning 3 days ago. Bloody and watery diarrhea. Nausea.  EXAM: CT ABDOMEN AND PELVIS WITH CONTRAST  TECHNIQUE: Multidetector CT imaging of the abdomen and pelvis was performed using the standard protocol following bolus administration of intravenous contrast.  CONTRAST:  25mL OMNIPAQUE IOHEXOL 300 MG/ML SOLN, 100mL OMNIPAQUE IOHEXOL 300 MG/ML SOLN  COMPARISON:  03/14/2013  FINDINGS: Minimal dependent atelectasis is present in the lung bases. The liver, gallbladder, spleen, adrenal glands, kidneys, and pancreas have an unremarkable enhanced appearance.  There is no evidence of bowel obstruction. There is moderate, diffuse  wall thickening involving the cecum and ascending colon with mild surrounding inflammatory fat stranding. The transverse and descending colon are underdistended, however there also appears to be diffuse wall thickening involving these segments as well. Mild inflammatory stranding is present about the descending colon. The appendix is mildly prominent proximally, measuring up to 10 mm in diameter, however it appears completely decompressed more distally without significant inflammation seen immediately adjacent to it and acute appendicitis is felt unlikely.  Subcentimeter retroperitoneal lymph nodes are stable to minimally more prominent than on the prior study. There is trace free fluid in the pelvis. No gross pelvic mass is identified. No acute osseous abnormality is identified.  IMPRESSION: Relatively diffuse colonic wall thickening, greatest in the ascending colon and consistent with colitis.   Electronically Signed   By: Sebastian AcheAllen  Grady  On: 07/09/2014 18:40    Impression/Plan: 44 yo with acute onset of nausea, bloody diarrhea and diffuse abdominal pain likely due to gastroenteritis. Doubt Crohn's based on the acute onset of her symptoms. Need to rule out infectious colitis and if stool studies negative then will need a colonoscopy to look for inflammatory bowel disease. Change to soft diet and advance as tolerated. Supportive care. Will follow.    LOS: 1 day   Nikki Glanzer C.  07/11/2014, 3:13 PM

## 2014-07-11 NOTE — Progress Notes (Signed)
[facility] PROGRESS NOTE  Jenny Evans EZM:[id number] DOB: [birth year] DOA: 07/10/2014 PCP: No PCP Per Patient  Assessment/Plan: 1-Colitis;  Continue with ciprofloxacin and flagyl.  HIV test results pending.  GI pathogen, stool culture need to be collected.  ESR; 14 , CRP; 0.5 CT abdomen; diffuse colonic wall thickening, greatest ascending colon.  GI consulted.   Anemia; Occult blood pending.  Last endoscopy 2014, several small antral ulcer pre pyloric.  Hb stable at 7. Will transfuse one unit in case of recurrent bleeding.  Check anemia panel.  Change Protonix to IV.  GI consulted.   Hypokalemia; replete IV.   Migraine; counseling provided to patient to avoid NSAID.   GERD; Protonix IV.   Tabacco use;  Counseling provide.  DVT prophylaxis; SCD.   Code Status: Full Code.  Family Communication: Care discussed with patient.  Disposition Plan: remain inpatient   Consultants:  GI. [place].   Procedures:  none  Antibiotics:  Ciprofloxacin 1-25  Flagyl 1-25  HPI/Subjective: No BM this morning. Still with pain, afraid of eating.  Had bloody stool with diarrhea.  No sick contact  Objective: Filed Vitals:   07/11/14 0509  BP: 100/50  Pulse: 60  Temp: 98.4 F (36.9 C)  Resp: 16    Intake/Output Summary (Last 24 hours) at 07/11/14 0807 Last data filed at 07/11/14 0300  Gross per 24 hour  Intake    240 ml  Output      0 ml  Net    240 ml   Filed Weights   07/11/14 0507  Weight: 55.021 kg (121 lb 4.8 oz)    Exam:   General:  No acute distress.   Cardiovascular: S 1, S 2 RRR  Respiratory: CTA  Abdomen: BS present, tenderness mid abdomen.   Musculoskeletal: no edema.   Data Reviewed: Basic Metabolic Panel:  Recent Labs Lab 07/09/14 1720 07/10/14 1545 07/11/14 0529  NA 134* 140 136  K 2.9* 3.5 3.2*  CL 105 108 107  CO2 _0 GLUCOSE 102* 94 92  BUN _1 CREATININE 0.52 0.55 0.46*  CALCIUM 8.6 9.0 8.3*   Liver  Function Tests:  Recent Labs Lab 07/09/14 1720 07/10/14 1545 07/11/14 0529  AST _2 ALT _3 ALKPHOS 67 61 54  BILITOT 0.3 <0.1* 0.3  PROT 6.9 6.5 5.5*  ALBUMIN 3.7 3.7 3.3*    Recent Labs Lab 07/09/14 1720 07/10/14 1545  LIPASE 29 28   No results for input(s): AMMONIA in the last 168 hours. CBC:  Recent Labs Lab 07/09/14 1720 07/10/14 1545 07/10/14 2200 07/11/14 0529  WBC 6.3 5.7 5.9 5.1  NEUTROABS 3.4 3.0  --   --   HGB 7.8* 7.6* 7.1* 7.0*  HCT 24.4* 23.6* 21.9* 21.8*  MCV 72.4* 73.1* 72.8* 72.9*  PLT 426* 387 331 328   Cardiac Enzymes: No results for input(s): CKTOTAL, CKMB, CKMBINDEX, TROPONINI in the last 168 hours. BNP (last 3 results) No results for input(s): PROBNP in the last 8760 hours. CBG: No results for input(s): GLUCAP in the last 168 hours.  No results found for this or any previous visit (from the past 240 hour(s)).   Studies: Ct Abdomen Pelvis W Contrast  07/09/2014   CLINICAL DATA:  Right lower quadrant abdominal pain beginning 3 days ago. Bloody and watery diarrhea. Nausea.  EXAM: CT ABDOMEN AND PELVIS WITH CONTRAST  TECHNIQUE: Multidetector CT imaging of the abdomen and pelvis was performed using the standard  protocol following bolus administration of intravenous contrast.  CONTRAST:  54m OMNIPAQUE IOHEXOL 300 MG/ML SOLN, 1040mOMNIPAQUE IOHEXOL 300 MG/ML SOLN  COMPARISON:  03/14/2013  FINDINGS: Minimal dependent atelectasis is present in the lung bases. The liver, gallbladder, spleen, adrenal glands, kidneys, and pancreas have an unremarkable enhanced appearance.  There is no evidence of bowel obstruction. There is moderate, diffuse wall thickening involving the cecum and ascending colon with mild surrounding inflammatory fat stranding. The transverse and descending colon are underdistended, however there also appears to be diffuse wall thickening involving these segments as well. Mild inflammatory stranding is present about the  descending colon. The appendix is mildly prominent proximally, measuring up to 10 mm in diameter, however it appears completely decompressed more distally without significant inflammation seen immediately adjacent to it and acute appendicitis is felt unlikely.  Subcentimeter retroperitoneal lymph nodes are stable to minimally more prominent than on the prior study. There is trace free fluid in the pelvis. No gross pelvic mass is identified. No acute osseous abnormality is identified.  IMPRESSION: Relatively diffuse colonic wall thickening, greatest in the ascending colon and consistent with colitis.   Electronically Signed   By: AlLogan Bores On: 07/09/2014 18:40    Scheduled Meds: . ciprofloxacin  400 mg Intravenous Q12H  . metronidazole  500 mg Intravenous Q8H  . nicotine  7 mg Transdermal Daily  . pantoprazole  40 mg Oral Daily   Continuous Infusions: . sodium chloride 75 mL/hr at 07/10/14 2104    Active Problems:   TOBACCO ABUSE   Migraine   GERD (gastroesophageal reflux disease)   Colitis    Time spent: 35 minutes.     ReNiel Hummer  [facility] Pager [phone] 7PM-7AM, please contact night-coverage at [email or web address], password [email or web address], 8:07 AM  LOS: 1 day

## 2014-07-12 LAB — TYPE AND SCREEN
ABO/RH(D): A POS
Antibody Screen: NEGATIVE
Unit division: 0

## 2014-07-12 LAB — BASIC METABOLIC PANEL
ANION GAP: 7 (ref 5–15)
BUN: 5 mg/dL — ABNORMAL LOW (ref 6–23)
CALCIUM: 8.5 mg/dL (ref 8.4–10.5)
CO2: 22 mmol/L (ref 19–32)
CREATININE: 0.43 mg/dL — AB (ref 0.50–1.10)
Chloride: 108 mmol/L (ref 96–112)
GFR calc Af Amer: >90 mL/min (ref >90)
GFR calc non Af Amer: >90 mL/min (ref >90)
GLUCOSE: 91 mg/dL (ref 70–99)
Potassium: 3.6 mmol/L (ref 3.5–5.1)
SODIUM: 137 mmol/L (ref 135–145)

## 2014-07-12 LAB — CBC
HCT: 25 % — ABNORMAL LOW (ref 36.0–46.0)
HEMOGLOBIN: 8.2 g/dL — AB (ref 12.0–15.0)
MCH: 24.5 pg — AB (ref 26.0–34.0)
MCHC: 32.8 g/dL (ref 30.0–36.0)
MCV: 74.6 fL — AB (ref 78.0–100.0)
Platelets: 291 10*3/uL (ref 150–400)
RBC: 3.35 MIL/uL — ABNORMAL LOW (ref 3.87–5.11)
RDW: 16.5 % — AB (ref 11.5–15.5)
WBC: 5.3 10*3/uL (ref 4.0–10.5)

## 2014-07-12 LAB — HIV ANTIBODY (ROUTINE TESTING W REFLEX)
HIV 1/HIV 2 AB: NONREACTIVE
HIV 1/O/2 Abs-Index Value: <1 (ref <1.00)

## 2014-07-12 LAB — MAGNESIUM: Magnesium: 1.7 mg/dL (ref 1.5–2.5)

## 2014-07-12 MED ORDER — POTASSIUM CHLORIDE 20 MEQ/15ML (10%) PO SOLN
40.0000 meq | Freq: Once | ORAL | Status: AC
  Administered 2014-07-12: 40 meq via ORAL
  Filled 2014-07-12: qty 30

## 2014-07-12 NOTE — Progress Notes (Signed)
[facility] PROGRESS NOTE  Jenny Evans AJO:[id number] DOB: 1970-09-26 DOA: 07/10/2014 PCP: No PCP Per Patient  Summary I have seen and examined [name] at bedside and reviewed her chart. Appreciate GI. Jenny Evans is a pleasant 44 y.o. female with hx of ?Iron deficiency anemia/peptic ulcers who came in with acute onset of diffuse sharp abdominal pain and bloody diarrhea that started last Thursday and was associated with chills, and nausea without vomiting.  CT showed diffuse colitis (greatest in the proximal stomach). EGD in 2014 by Dr. Paulita Fujita that showed antral ulcers. Patient was placed on Cipro/Flagyl and she has had no bowel movement since admission, therefore stool studies are pending. ? Plans for colonoscopy. She still has abdominal pain.  1-Colitis;  Continue with ciprofloxacin and flagyl.  HIV test results pending.  GI pathogen, stool culture pending.  ESR; 14 , CRP; 0.5 CT abdomen; diffuse colonic wall thickening, greatest ascending colon.  Appreciate GI-defer to GI for further workup and diet advancement.   Iron deficiency Anemia; Occult blood pending.  Last endoscopy 2014, several small antral ulcer pre pyloric.  Hb 8.2g./dl after transfusion of 1 unit PRBC.   Anemia panel suggests iron deficiency with ferritin 9 and iron level 10-likely gradual blood loss.  Continue Protonix.  GI consulted.   Hypokalemia; replete potassium as needed.   Migraine; counseling provided to patient to avoid NSAID.   GERD; Protonix IV.   Tabacco use;  Counseling provide.  DVT prophylaxis; SCD.   Code Status: Full Code.  Family Communication: Care discussed with patient.  Disposition Plan: remain inpatient   Consultants:  GI.   Procedures:  none  Antibiotics:  Ciprofloxacin 1-25 Flagyl 1-25  HPI/Subjective: Hungry, has some abdominal pain.   Objective: Filed Vitals:   07/12/14 0635  BP: 98/60  Pulse: 73  Temp: 97.7 F (36.5 C)  Resp: 16     Intake/Output Summary (Last 24 hours) at 07/12/14 1132 Last data filed at 07/12/14 0600  Gross per 24 hour  Intake 2162.5 ml  Output   1000 ml  Net 1162.5 ml   Filed Weights   07/11/14 0507  Weight: 55.021 kg (121 lb 4.8 oz)    Exam:   General:  Lying comfortably in bed.  Cardiovascular:  RRR. S1-S2 normal, no murmurs.   Respiratory:  lungs clear   Abdomen:Soft with some mild tenderness to palpation. Bowel sounds normal.  Musculoskeletal: No pedal edema.   Data Reviewed: Basic Metabolic Panel:  Recent Labs Lab 07/09/14 1720 07/10/14 1545 07/11/14 0529 07/12/14 0538  NA 134* 140 136 137  K 2.9* 3.5 3.2* 3.6  CL 105 108 107 108  CO2 _0 GLUCOSE 102* 94 92 91  BUN _1 5*  CREATININE 0.52 0.55 0.46* 0.43*  CALCIUM 8.6 9.0 8.3* 8.5   Liver Function Tests:  Recent Labs Lab 07/09/14 1720 07/10/14 1545 07/11/14 0529  AST _2 ALT _3 ALKPHOS 67 61 54  BILITOT 0.3 <0.1* 0.3  PROT 6.9 6.5 5.5*  ALBUMIN 3.7 3.7 3.3*    Recent Labs Lab 07/09/14 1720 07/10/14 1545  LIPASE 29 28   No results for input(s): AMMONIA in the last 168 hours. CBC:  Recent Labs Lab 07/09/14 1720 07/10/14 1545 07/10/14 2200 07/11/14 0529 07/12/14 0538  WBC 6.3 5.7 5.9 5.1 5.3  NEUTROABS 3.4 3.0  --   --   --   HGB 7.8* 7.6* 7.1* 7.0* 8.2*  HCT 24.4* 23.6*  [facility] PROGRESS NOTE  Terrisa Z Koffler MRN:[id number] DOB: 03/18/1971 DOA: 07/10/2014 PCP: No PCP Per Patient  Summary I have seen and examined [name] at bedside and reviewed her chart. Appreciate GI. Jenny Evans is a pleasant 43 y.o. female with hx of ?Iron deficiency anemia/peptic ulcers who came in with acute onset of diffuse sharp abdominal pain and bloody diarrhea that started last Thursday and was associated with chills, and nausea without vomiting.  CT showed diffuse colitis (greatest in the proximal stomach). EGD in 2014 by Dr. Outlaw that showed antral ulcers. Patient was placed on Cipro/Flagyl and she has had no bowel movement since admission, therefore stool studies are pending. ? Plans for colonoscopy. She still has abdominal pain.  1-Colitis;  Continue with ciprofloxacin and flagyl.  HIV test results pending.  GI pathogen, stool culture pending.  ESR; 14 , CRP; 0.5 CT abdomen; diffuse colonic wall thickening, greatest ascending colon.  Appreciate GI-defer to GI for further workup and diet advancement.   Iron deficiency Anemia; Occult blood pending.  Last endoscopy 2014, several small antral ulcer pre pyloric.  Hb 8.2g./dl after transfusion of 1 unit PRBC.   Anemia panel suggests iron deficiency with ferritin 9 and iron level 10-likely gradual blood loss.  Continue Protonix.  GI consulted.   Hypokalemia; replete potassium as needed.   Migraine; counseling provided to patient to avoid NSAID.   GERD; Protonix IV.   Tabacco use;  Counseling provide.  DVT prophylaxis; SCD.   Code Status: Full Code.  Family Communication: Care discussed with patient.  Disposition Plan: remain inpatient   Consultants:  GI.   Procedures:  none  Antibiotics:  Ciprofloxacin 1-25 Flagyl 1-25  HPI/Subjective: Hungry, has some abdominal pain.   Objective: Filed Vitals:   07/12/14 0635  BP: 98/60  Pulse: 73  Temp: 97.7 F (36.5 C)  Resp: 16     Intake/Output Summary (Last 24 hours) at 07/12/14 1132 Last data filed at 07/12/14 0600  Gross per 24 hour  Intake 2162.5 ml  Output   1000 ml  Net 1162.5 ml   Filed Weights   07/11/14 0507  Weight: 55.021 kg (121 lb 4.8 oz)    Exam:   General:  Lying comfortably in bed.  Cardiovascular:  RRR. S1-S2 normal, no murmurs.   Respiratory:  lungs clear   Abdomen:Soft with some mild tenderness to palpation. Bowel sounds normal.  Musculoskeletal: No pedal edema.   Data Reviewed: Basic Metabolic Panel:  Recent Labs Lab 07/09/14 1720 07/10/14 1545 07/11/14 0529 07/12/14 0538  NA 134* 140 136 137  K 2.9* 3.5 3.2* 3.6  CL 105 108 107 108  CO2 26 24 26 22  GLUCOSE 102* 94 92 91  BUN 7 8 6 5*  CREATININE 0.52 0.55 0.46* 0.43*  CALCIUM 8.6 9.0 8.3* 8.5   Liver Function Tests:  Recent Labs Lab 07/09/14 1720 07/10/14 1545 07/11/14 0529  AST 20 20 16  ALT 12 10 10  ALKPHOS 67 61 54  BILITOT 0.3 <0.1* 0.3  PROT 6.9 6.5 5.5*  ALBUMIN 3.7 3.7 3.3*    Recent Labs Lab 07/09/14 1720 07/10/14 1545  LIPASE 29 28   No results for input(s): AMMONIA in the last 168 hours. CBC:  Recent Labs Lab 07/09/14 1720 07/10/14 1545 07/10/14 2200 07/11/14 0529 07/12/14 0538  WBC 6.3 5.7 5.9 5.1 5.3  NEUTROABS 3.4 3.0  --   --   --   HGB 7.8* 7.6* 7.1* 7.0* 8.2*  HCT 24.4* 23.6*

## 2014-07-12 NOTE — Progress Notes (Signed)
[facility] Progress Note  Subjective: Still having lower abdominal pain no stools since yesterday  Objective: Vital signs in last 24 hours: Temp:  [97.7 F (36.5 C)-98.7 F (37.1 C)] 97.7 F (36.5 C) (01/27 0635) Pulse Rate:  [72-75] 73 (01/27 0635) Resp:  [16] 16 (01/27 0635) BP: (87-104)/(50-62) 98/60 mmHg (01/27 0635) SpO2:  [98 %-100 %] 99 % (01/27 0635) Weight change:    PE: Abdomen soft with moderate diffuse tenderness  Lab Results: Results for orders placed or performed during the hospital encounter of 07/10/14 (from the past 24 hour(s))  CBC     Status: Abnormal   Collection Time: 07/12/14  5:38 AM  Result Value Ref Range   WBC 5.3 4.0 - 10.5 K/uL   RBC 3.35 (L) 3.87 - 5.11 MIL/uL   Hemoglobin 8.2 (L) 12.0 - 15.0 g/dL   HCT 40.925.0 (L) 81.136.0 - 91.446.0 %   MCV 74.6 (L) 78.0 - 100.0 fL   MCH 24.5 (L) 26.0 - 34.0 pg   MCHC 32.8 30.0 - 36.0 g/dL   RDW 78.216.5 (H) 95.611.5 - 21.315.5 %   Platelets 291 150 - 400 K/uL  Basic metabolic panel     Status: Abnormal   Collection Time: 07/12/14  5:38 AM  Result Value Ref Range   Sodium 137 135 - 145 mmol/L   Potassium 3.6 3.5 - 5.1 mmol/L   Chloride 108 96 - 112 mmol/L   CO2 22 19 - 32 mmol/L   Glucose, Bld 91 70 - 99 mg/dL   BUN 5 (L) 6 - 23 mg/dL   Creatinine, Ser 0.860.43 (L) 0.50 - 1.10 mg/dL   Calcium 8.5 8.4 - 57.810.5 mg/dL   GFR calc non Af Amer >90 >90 mL/min   GFR calc Af Amer >90 >90 mL/min   Anion gap 7 5 - 15  Magnesium     Status: None   Collection Time: 07/12/14  5:38 AM  Result Value Ref Range   Magnesium 1.7 1.5 - 2.5 mg/dL    Studies/Results: No results found.    Assessment: Acute diffuse colitis most suggestive of an infectious etiology. Inflammatory bowel disease appears less likely due to acute presentation although possible.  Plan: Continue Cipro and Flagyl and try to obtain stool for enteric pathogens. Would like to defer colonoscopy acutely to see if colitis will resolve with antibiotics. We will  continue to follow.    Shaul Trautman C 07/12/2014, 12:48 PM

## 2014-07-13 DIAGNOSIS — Z720 Tobacco use: Secondary | ICD-10-CM

## 2014-07-13 LAB — COMPREHENSIVE METABOLIC PANEL
ALBUMIN: 3.3 g/dL — AB (ref 3.5–5.2)
ALT: 21 U/L (ref 0–35)
AST: 36 U/L (ref 0–37)
Alkaline Phosphatase: 59 U/L (ref 39–117)
Anion gap: 6 (ref 5–15)
BILIRUBIN TOTAL: 0.5 mg/dL (ref 0.3–1.2)
BUN: 6 mg/dL (ref 6–23)
CHLORIDE: 108 mmol/L (ref 96–112)
CO2: 25 mmol/L (ref 19–32)
CREATININE: 0.51 mg/dL (ref 0.50–1.10)
Calcium: 8.8 mg/dL (ref 8.4–10.5)
GFR calc non Af Amer: >90 mL/min (ref >90)
Glucose, Bld: 88 mg/dL (ref 70–99)
Potassium: 3.7 mmol/L (ref 3.5–5.1)
SODIUM: 139 mmol/L (ref 135–145)
TOTAL PROTEIN: 5.9 g/dL — AB (ref 6.0–8.3)

## 2014-07-13 LAB — CBC
HEMATOCRIT: 28.1 % — AB (ref 36.0–46.0)
Hemoglobin: 9.1 g/dL — ABNORMAL LOW (ref 12.0–15.0)
MCH: 24.2 pg — AB (ref 26.0–34.0)
MCHC: 32.4 g/dL (ref 30.0–36.0)
MCV: 74.7 fL — ABNORMAL LOW (ref 78.0–100.0)
Platelets: 378 10*3/uL (ref 150–400)
RBC: 3.76 MIL/uL — AB (ref 3.87–5.11)
RDW: 16.8 % — ABNORMAL HIGH (ref 11.5–15.5)
WBC: 7.5 10*3/uL (ref 4.0–10.5)

## 2014-07-13 LAB — CLOSTRIDIUM DIFFICILE BY PCR: CDIFFPCR: NEGATIVE

## 2014-07-13 MED ORDER — CIPROFLOXACIN HCL 500 MG PO TABS: 500.0000 mg | ORAL_TABLET | Freq: Two times a day (BID) | ORAL | Status: DC

## 2014-07-13 MED ORDER — METRONIDAZOLE 500 MG PO TABS: 500.0000 mg | ORAL_TABLET | Freq: Three times a day (TID) | ORAL | Status: DC

## 2014-07-13 NOTE — Progress Notes (Signed)
[facility] Progress Note  Subjective: Pain improved, had runny nonbloody BM yesterday  Objective: Vital signs in last 24 hours: Temp:  [97.5 F (36.4 C)-98.1 F (36.7 C)] 97.5 F (36.4 C) (01/28 0557) Pulse Rate:  [69-77] 77 (01/28 0557) Resp:  [15-18] 18 (01/28 0557) BP: (82-106)/(44-71) 101/59 mmHg (01/28 0557) SpO2:  [100 %] 100 % (01/28 0557) Weight change:    PE: Abdomen soft less tender than yesterday  Lab Results: Results for orders placed or performed during the hospital encounter of 07/10/14 (from the past 24 hour(s))  Clostridium Difficile by PCR     Status: None   Collection Time: 07/12/14  6:13 PM  Result Value Ref Range   C difficile by pcr NEGATIVE NEGATIVE  CBC     Status: Abnormal   Collection Time: 07/13/14  6:00 AM  Result Value Ref Range   WBC 7.5 4.0 - 10.5 K/uL   RBC 3.76 (L) 3.87 - 5.11 MIL/uL   Hemoglobin 9.1 (L) 12.0 - 15.0 g/dL   HCT 45.428.1 (L) 09.836.0 - 11.946.0 %   MCV 74.7 (L) 78.0 - 100.0 fL   MCH 24.2 (L) 26.0 - 34.0 pg   MCHC 32.4 30.0 - 36.0 g/dL   RDW 14.716.8 (H) 82.911.5 - 56.215.5 %   Platelets 378 150 - 400 K/uL  Comprehensive metabolic panel     Status: Abnormal   Collection Time: 07/13/14  6:00 AM  Result Value Ref Range   Sodium 139 135 - 145 mmol/L   Potassium 3.7 3.5 - 5.1 mmol/L   Chloride 108 96 - 112 mmol/L   CO2 25 19 - 32 mmol/L   Glucose, Bld 88 70 - 99 mg/dL   BUN 6 6 - 23 mg/dL   Creatinine, Ser 1.300.51 0.50 - 1.10 mg/dL   Calcium 8.8 8.4 - 86.510.5 mg/dL   Total Protein 5.9 (L) 6.0 - 8.3 g/dL   Albumin 3.3 (L) 3.5 - 5.2 g/dL   AST 36 0 - 37 U/L   ALT 21 0 - 35 U/L   Alkaline Phosphatase 59 39 - 117 U/L   Total Bilirubin 0.5 0.3 - 1.2 mg/dL   GFR calc non Af Amer >90 >90 mL/min   GFR calc Af Amer >90 >90 mL/min   Anion gap 6 5 - 15    Studies/Results: No results found.    Assessment: Acute colitis, suspect infectious more than inflammatory bowel disease, awaiting stool studies which were just obtained yesterday  Plan: Continue Cipro and Flagyl, patient wants to go home, would keep in one more day and if improvement continues sent home on oral antibiotics tomorrow.    Tahji  C 07/13/2014, 8:45 AM

## 2014-07-13 NOTE — Discharge Instructions (Signed)

## 2014-07-13 NOTE — Discharge Summary (Signed)
Physician Discharge Summary  Jenny Evans ZOX:[id number] DOB: [birth year] DOA: 07/10/2014  PCP: No PCP Per Patient  Admit date: 07/10/2014 Discharge date: 07/13/2014  Recommendations for Outpatient Follow-up:  Patient will take ciprofloxacin and Flagyl for 10 days on discharge. She will follow up with the GI per scheduled appointment.   Discharge Diagnoses:  Active Problems:   TOBACCO ABUSE   Migraine   GERD (gastroesophageal reflux disease)   Colitis    Discharge Condition: stable; insists on going home today.  Diet recommendation: as tolerated   History of present illness:  44 year old female with history of iron deficiency anemia, peptic ulcer disease who presented to [facility] ED with sudden onset diffuse, sharp abdominal pain and bloody diarrhea associated with subjective fevers and chills, nausea without vomiting. CT on admission showed diffuse colitis greatest in the proximal stomach. Patient was placed on empiric Cipro and Flagyl. She insisted on going home today. She was discharged with 10 day course of Cipro and Flagyl.  Assessment and plan:  Principal problem: Diffuse colitis, greatest in proximal stomach / abdominal pain with nausea - Patient presented with abdominal pain, nausea without vomiting. - CT abdomen showed diffuse colitis, greatest in the proximal stomach. - Stool culture is negative so far. Will follow up on final culture report. - Patient wants to go home today. Per GI, she will continue Cipro and Flagyl and follow-up in their office.  Iron deficiency Anemia; - No bleeding noted. - Patient transfused 1 unit of PRBC. - Hemoglobin is 9.1 prior to discharge  Hypokalemia - Secondary to GI losses. Supplemented.   Migraine - counseling provided to patient to avoid NSAID.   GERD - Patient is on Nexium twice daily.  Tabacco use;  - Counseling provided.   DVT prophylaxis - SCD.   Code Status: Full Code.  Family Communication: Care discussed with  patient.     Consultants:  GI.  Procedures:  none    Signed:  Manson Passey, MD  [facility] 07/13/2014, 2:25 PM  Pager #: [phone]   Discharge Exam: Filed Vitals:   07/13/14 1349  BP: 108/67  Pulse: 68  Temp: 97.5 F (36.4 C)  Resp: 16   Filed Vitals:   07/13/14 0129 07/13/14 0557 07/13/14 0815 07/13/14 1349  BP: 104/70 101/59 101/75 108/67  Pulse: 69 77 68 68  Temp:  97.5 F (36.4 C) 98.1 F (36.7 C) 97.5 F (36.4 C)  TempSrc:  Oral Oral Oral  Resp:  Weight:      SpO2:  100% 100% 100%    General: Pt is alert, follows commands appropriately, not in acute distress Cardiovascular: Regular rate and rhythm, S1/S2 +, no murmurs Respiratory: Clear to auscultation bilaterally, no wheezing, no crackles, no rhonchi Abdominal: Soft, non tender, non distended, bowel sounds +, no guarding Extremities: no edema, no cyanosis, pulses palpable bilaterally DP and PT Neuro: Grossly nonfocal  Discharge Instructions  Discharge Instructions    Call MD for:  difficulty breathing, headache or visual disturbances    Complete by:  As directed      Call MD for:  persistant nausea and vomiting    Complete by:  As directed      Call MD for:  severe uncontrolled pain    Complete by:  As directed      Diet - low sodium heart healthy    Complete by:  As directed      Discharge instructions    Complete by:  As directed  1. Take cipro and flagyl for 10 days on discharge.     Increase activity slowly    Complete by:  As directed             Medication List    TAKE these medications        ciprofloxacin 500 MG tablet  Commonly known as:  CIPRO  Take 1 tablet (500 mg total) by mouth 2 (two) times daily.     esomeprazole 40 MG capsule  Commonly known as:  NEXIUM  Take 1 capsule (40 mg total) by mouth 2 (two) times daily before a meal.     metroNIDAZOLE 500 MG tablet  Commonly known as:  FLAGYL  Take 1 tablet (500 mg total) by mouth 3 (three)  times daily.     oxyCODONE-acetaminophen 5-325 MG per tablet  Commonly known as:  PERCOCET  Take 1 tablet by mouth every 4 (four) hours as needed.     potassium chloride SA 20 MEQ tablet  Commonly known as:  K-DUR,KLOR-CON  Take 2 tablets (40 mEq total) by mouth daily.     SUMAtriptan 6 MG/0.5ML Soln injection  Commonly known as:  IMITREX  Inject 6 mg into the skin every 2 (two) hours as needed for migraine or headache (migraine). May repeat in 2 hours if headache persists or recurs.          The results of significant diagnostics from this hospitalization (including imaging, microbiology, ancillary and laboratory) are listed below for reference.    Significant Diagnostic Studies: Ct Abdomen Pelvis W Contrast  07/09/2014   CLINICAL DATA:  Right lower quadrant abdominal pain beginning 3 days ago. Bloody and watery diarrhea. Nausea.  EXAM: CT ABDOMEN AND PELVIS WITH CONTRAST  TECHNIQUE: Multidetector CT imaging of the abdomen and pelvis was performed using the standard protocol following bolus administration of intravenous contrast.  CONTRAST:  25mL OMNIPAQUE IOHEXOL 300 MG/ML SOLN, OMNIPAQUE IOHEXOL 300 MG/ML SOLN  COMPARISON:  03/14/2013  FINDINGS: Minimal dependent atelectasis is present in the lung bases. The liver, gallbladder, spleen, adrenal glands, kidneys, and pancreas have an unremarkable enhanced appearance.  There is no evidence of bowel obstruction. There is moderate, diffuse wall thickening involving the cecum and ascending colon with mild surrounding inflammatory fat stranding. The transverse and descending colon are underdistended, however there also appears to be diffuse wall thickening involving these segments as well. Mild inflammatory stranding is present about the descending colon. The appendix is mildly prominent proximally, measuring up to 10 mm in diameter, however it appears completely decompressed more distally without significant inflammation seen immediately  adjacent to it and acute appendicitis is felt unlikely.  Subcentimeter retroperitoneal lymph nodes are stable to minimally more prominent than on the prior study. There is trace free fluid in the pelvis. No gross pelvic mass is identified. No acute osseous abnormality is identified.  IMPRESSION: Relatively diffuse colonic wall thickening, greatest in the ascending colon and consistent with colitis.   Electronically Signed   By: Sebastian Ache   On: 07/09/2014 18:40    Microbiology: Recent Results (from the past 240 hour(s))  Clostridium Difficile by PCR     Status: None   Collection Time: 07/12/14  6:13 PM  Result Value Ref Range Status   C difficile by pcr NEGATIVE NEGATIVE Final    Comment: Performed at [facility]     Labs: Basic Metabolic Panel:  Recent Labs Lab 07/09/14 1720 07/10/14 1545 07/11/14 0529 07/12/14 0538 07/13/14 0600  NA 134*  140 136 137 139  K 2.9* 3.5 3.2* 3.6 3.7  CL 105 108 107 108 108  CO2 26 24 26 22 25   GLUCOSE 102* 94 92 91 88  BUN 7 8 6  5* 6  CREATININE 0.52 0.55 0.46* 0.43* 0.51  CALCIUM 8.6 9.0 8.3* 8.5 8.8  MG  --   --   --  1.7  --    Liver Function Tests:  Recent Labs Lab 07/09/14 1720 07/10/14 1545 07/11/14 0529 07/13/14 0600  AST 20 20 16  36  ALT 12 10 10 21   ALKPHOS 67 61 54 59  BILITOT 0.3 <0.1* 0.3 0.5  PROT 6.9 6.5 5.5* 5.9*  ALBUMIN 3.7 3.7 3.3* 3.3*    Recent Labs Lab 07/09/14 1720 07/10/14 1545  LIPASE 29 28   No results for input(s): AMMONIA in the last 168 hours. CBC:  Recent Labs Lab 07/09/14 1720 07/10/14 1545 07/10/14 2200 07/11/14 0529 07/12/14 0538 07/13/14 0600  WBC 6.3 5.7 5.9 5.1 5.3 7.5  NEUTROABS 3.4 3.0  --   --   --   --   HGB 7.8* 7.6* 7.1* 7.0* 8.2* 9.1*  HCT 24.4* 23.6* 21.9* 21.8* 25.0* 28.1*  MCV 72.4* 73.1* 72.8* 72.9* 74.6* 74.7*  PLT 426* 387 331 328 291 378   Cardiac Enzymes: No results for input(s): CKTOTAL, CKMB, CKMBINDEX, TROPONINI in the last 168 hours. BNP: BNP (last  3 results) No results for input(s): PROBNP in the last 8760 hours. CBG: No results for input(s): GLUCAP in the last 168 hours.  Time coordinating discharge: Over 30 minutes

## 2014-07-18 ENCOUNTER — Telehealth (HOSPITAL_BASED_OUTPATIENT_CLINIC_OR_DEPARTMENT_OTHER): Payer: Self-pay | Admitting: Emergency Medicine

## 2014-07-18 NOTE — Telephone Encounter (Signed)
[facility] calling to attempt to locate PCP, no known PCP noted, no MD noted to followup with

## 2014-08-09 ENCOUNTER — Encounter (HOSPITAL_COMMUNITY): Payer: Self-pay

## 2014-08-09 ENCOUNTER — Emergency Department (HOSPITAL_COMMUNITY)
Admission: EM | Admit: 2014-08-09 | Discharge: 2014-08-10 | Disposition: A | Discharge status: Home / Self Care | Payer: 59 | Attending: Emergency Medicine | Admitting: Emergency Medicine

## 2014-08-09 DIAGNOSIS — G8929 Other chronic pain: Secondary | ICD-10-CM | POA: Insufficient documentation

## 2014-08-09 DIAGNOSIS — G43909 Migraine, unspecified, not intractable, without status migrainosus: Secondary | ICD-10-CM | POA: Diagnosis not present

## 2014-08-09 DIAGNOSIS — Z720 Tobacco use: Secondary | ICD-10-CM | POA: Diagnosis not present

## 2014-08-09 DIAGNOSIS — Z862 Personal history of diseases of the blood and blood-forming organs and certain disorders involving the immune mechanism: Secondary | ICD-10-CM | POA: Insufficient documentation

## 2014-08-09 DIAGNOSIS — Z3202 Encounter for pregnancy test, result negative: Secondary | ICD-10-CM | POA: Diagnosis not present

## 2014-08-09 DIAGNOSIS — J45909 Unspecified asthma, uncomplicated: Secondary | ICD-10-CM | POA: Diagnosis not present

## 2014-08-09 DIAGNOSIS — K219 Gastro-esophageal reflux disease without esophagitis: Secondary | ICD-10-CM | POA: Insufficient documentation

## 2014-08-09 DIAGNOSIS — B379 Candidiasis, unspecified: Secondary | ICD-10-CM | POA: Insufficient documentation

## 2014-08-09 DIAGNOSIS — R1030 Lower abdominal pain, unspecified: Secondary | ICD-10-CM

## 2014-08-09 DIAGNOSIS — Z792 Long term (current) use of antibiotics: Secondary | ICD-10-CM | POA: Diagnosis not present

## 2014-08-09 DIAGNOSIS — R300 Dysuria: Secondary | ICD-10-CM

## 2014-08-09 DIAGNOSIS — Z79899 Other long term (current) drug therapy: Secondary | ICD-10-CM | POA: Insufficient documentation

## 2014-08-09 DIAGNOSIS — R350 Frequency of micturition: Secondary | ICD-10-CM | POA: Diagnosis present

## 2014-08-09 NOTE — ED Notes (Signed)
Patient complains of abdominal/ bilateral flank pain x 2 days with increased urinary frequency.

## 2014-08-10 LAB — URINALYSIS, ROUTINE W REFLEX MICROSCOPIC
Bilirubin Urine: NEGATIVE
GLUCOSE, UA: NEGATIVE mg/dL
Hgb urine dipstick: NEGATIVE
Ketones, ur: NEGATIVE mg/dL
Leukocytes, UA: NEGATIVE
Nitrite: NEGATIVE
PROTEIN: NEGATIVE mg/dL
Specific Gravity, Urine: 1.018 (ref 1.005–1.030)
Urobilinogen, UA: 1 mg/dL (ref 0.0–1.0)
pH: 6 (ref 5.0–8.0)

## 2014-08-10 LAB — WET PREP, GENITAL: TRICH WET PREP: NONE SEEN

## 2014-08-10 LAB — PREGNANCY, URINE: PREG TEST UR: NEGATIVE

## 2014-08-10 MED ORDER — FLUCONAZOLE 150 MG PO TABS: 150.0000 mg | ORAL_TABLET | Freq: Every day | ORAL | Status: AC

## 2014-08-10 MED ORDER — FLUCONAZOLE 150 MG PO TABS
150.0000 mg | ORAL_TABLET | Freq: Once | ORAL | Status: AC
  Administered 2014-08-10: 150 mg via ORAL
  Filled 2014-08-10: qty 1

## 2014-08-10 MED ORDER — OXYCODONE-ACETAMINOPHEN 5-325 MG PO TABS
1.0000 | ORAL_TABLET | Freq: Once | ORAL | Status: AC
  Administered 2014-08-10: 1 via ORAL
  Filled 2014-08-10: qty 1

## 2014-08-10 MED ORDER — CEPHALEXIN 500 MG PO CAPS: 500.0000 mg | ORAL_CAPSULE | Freq: Two times a day (BID) | ORAL | Status: DC

## 2014-08-10 MED ORDER — NITROFURANTOIN MONOHYD MACRO 100 MG PO CAPS: 100.0000 mg | ORAL_CAPSULE | Freq: Two times a day (BID) | ORAL | Status: DC

## 2014-08-10 MED ORDER — PHENAZOPYRIDINE HCL 200 MG PO TABS: 200.0000 mg | ORAL_TABLET | Freq: Three times a day (TID) | ORAL | Status: DC

## 2014-08-10 NOTE — ED Notes (Signed)
Urine preg in progress.

## 2014-08-10 NOTE — ED Provider Notes (Signed)
CSN: [id number]     Arrival date & time 08/09/14  2322 History   First MD Initiated Contact with Patient 08/09/14 2358     Chief Complaint  Patient presents with  . Abdominal Pain  . Urinary Frequency     (Consider location/radiation/quality/duration/timing/severity/associated sxs/prior Treatment) HPI Comments: Pt comes in with cc of urinary frequency. Pt has been having frequency for few days and back pain. The back pain is located in the lower back region, bilaterally. There is no n/v/f/c. Pt has scant discharge. No hx of STD, and no risk factor for the same. Per records, pt was on cipro for uti recently.   Patient is a 44 y.o. female presenting with abdominal pain and frequency. The history is provided by the patient.  Abdominal Pain Associated symptoms: no chest pain, no dysuria, no nausea, no shortness of breath and no vomiting   Urinary Frequency Associated symptoms include abdominal pain. Pertinent negatives include no chest pain, no headaches and no shortness of breath.    Past Medical History  Diagnosis Date  . Migraine   . GERD (gastroesophageal reflux disease)   . Ulcer   . Toothache   . Chronic pain   . Bronchitis   . Sickle cell trait   . Asthma    Past Surgical History  Procedure Laterality Date  . Myringotomy      age 44  . Esophagogastroduodenoscopy (egd) with propofol N/A 04/04/2013    Procedure: ESOPHAGOGASTRODUODENOSCOPY (EGD) WITH PROPOFOL;  Surgeon: Willis ModenaWilliam Outlaw, MD;  Location: [facility];  Service: Endoscopy;  Laterality: N/A;  . Examination under anesthesia N/A 08/10/2013    Procedure: RECTAL EXAM UNDER ANESTHESIA;  Surgeon: Clovis Puhomas A. Cornett, MD;  Location: [facility];  Service: General;  Laterality: N/A;  ligation   Family History  Problem Relation Age of Onset  . Heart disease Mother   . Kidney disease Mother   . Hypertension Mother   . COPD Mother   . Diabetes Mother   . Heart disease Father   . Crohn's disease Maternal  Aunt    History  Substance Use Topics  . Smoking status: Current Every Day Smoker -- 0.50 packs/day for 15 years    Types: Cigarettes  . Smokeless tobacco: Never Used  . Alcohol Use: Yes     Comment: very rare   OB History    No data available     Review of Systems  Constitutional: Negative for activity change.  Respiratory: Negative for shortness of breath.   Cardiovascular: Negative for chest pain.  Gastrointestinal: Positive for abdominal pain. Negative for nausea and vomiting.  Genitourinary: Positive for frequency. Negative for dysuria.  Musculoskeletal: Positive for back pain. Negative for neck pain.  Neurological: Negative for headaches.      Allergies  Hydrocodone-acetaminophen  Home Medications   Prior to Admission medications   Medication Sig Start Date End Date Taking? Authorizing Provider  esomeprazole (NEXIUM) 40 MG capsule Take 1 capsule (40 mg total) by mouth 2 (two) times daily before a meal. 04/04/13  Yes Willis ModenaWilliam Outlaw, MD  SUMAtriptan (IMITREX) 6 MG/0.5ML SOLN injection Inject 6 mg into the skin every 2 (two) hours as needed for migraine or headache (migraine). May repeat in 2 hours if headache persists or recurs.   Yes Historical Provider, MD  ciprofloxacin (CIPRO) 500 MG tablet Take 1 tablet (500 mg total) by mouth 2 (two) times daily. Patient not taking: Reported on 08/10/2014 07/13/14   Alison MurrayAlma M Devine, MD  metroNIDAZOLE (FLAGYL) 500  MG tablet Take 1 tablet (500 mg total) by mouth 3 (three) times daily. Patient not taking: Reported on 08/10/2014 07/13/14   Alison Murray, MD  nitrofurantoin, macrocrystal-monohydrate, (MACROBID) 100 MG capsule Take 1 capsule (100 mg total) by mouth 2 (two) times daily. 08/10/14   Derwood Kaplan, MD  oxyCODONE-acetaminophen (PERCOCET) 5-325 MG per tablet Take 1 tablet by mouth every 4 (four) hours as needed. Patient not taking: Reported on 08/10/2014 07/09/14   Gilda Crease, MD  phenazopyridine (PYRIDIUM) 200 MG tablet  Take 1 tablet (200 mg total) by mouth 3 (three) times daily. 08/10/14   Derwood Kaplan, MD  potassium chloride SA (K-DUR,KLOR-CON) 20 MEQ tablet Take 2 tablets (40 mEq total) by mouth daily. Patient not taking: Reported on 08/10/2014 07/09/14   Gilda Crease, MD   BP 106/63 mmHg  Pulse 79  Temp(Src) 97.8 F (36.6 C) (Oral)  Resp 16  Ht  (1.626 m)  Wt 130 lb (58.968 kg)  BMI 22.30 kg/m2  SpO2 95%  LMP 07/30/2014 (Approximate) Physical Exam  Constitutional: She is oriented to person, place, and time. She appears well-developed.  HENT:  Head: Normocephalic and atraumatic.  Eyes: Conjunctivae and EOM are normal. Pupils are equal, round, and reactive to light.  Neck: Normal range of motion. Neck supple.  Cardiovascular: Normal rate, regular rhythm, normal heart sounds and intact distal pulses.   No murmur heard. Pulmonary/Chest: Effort normal. No respiratory distress. She has no wheezes.  Abdominal: Soft. Bowel sounds are normal. She exhibits no distension. There is no tenderness. There is no rebound and no guarding.  Genitourinary: Vagina normal and uterus normal.  External exam - normal, no lesions Speculum exam: Pt has some white discharge, no blood Bimanual exam: Patient has no CMT, no adnexal tenderness or fullness and cervical os is closed  Neurological: She is alert and oriented to person, place, and time.  Skin: Skin is warm and dry.  Nursing note and vitals reviewed.   ED Course  Procedures (including critical care time) Labs Review Labs Reviewed  WET PREP, GENITAL - Abnormal; Notable for the following:    Yeast Wet Prep HPF POC RARE (*)    Clue Cells Wet Prep HPF POC FEW (*)    WBC, Wet Prep HPF POC FEW (*)    All other components within normal limits  URINE CULTURE  URINALYSIS, ROUTINE W REFLEX MICROSCOPIC  PREGNANCY, URINE  GC/CHLAMYDIA PROBE AMP (Arabi)    Imaging Review No results found.   EKG Interpretation None      MDM   Final  diagnoses:  Lower abdominal pain  Dysuria  Yeast infection     Pt with dysuria, polyuria. Clear UA. Pelvic exam normal. + yeast infection. Diflucan given in the ER. Will d.c with antibiotics, as UA has sensitivity in the 80 % range, and if this is beginning of pyelo, we dont want her to wait.   Derwood Kaplan, MD 08/10/14 [phone]

## 2014-08-10 NOTE — ED Notes (Signed)
Patient c/o urine frequency without discomfort. Patient states she has bilateral flank pain as well, 7/10.

## 2014-08-10 NOTE — ED Notes (Signed)
Dr. Nanavati at bedside 

## 2014-08-10 NOTE — ED Notes (Signed)
Patient requesting update; MD notified.

## 2014-08-10 NOTE — Discharge Instructions (Signed)
Abdominal Pain, Women °Abdominal (stomach, pelvic, or belly) pain can be caused by many things. It is important to tell your doctor: °· The location of the pain. °· Does it come and go or is it present all the time? °· Are there things that start the pain (eating certain foods, exercise)? °· Are there other symptoms associated with the pain (fever, nausea, vomiting, diarrhea)? °All of this is helpful to know when trying to find the cause of the pain. °CAUSES  °· Stomach: virus or bacteria infection, or ulcer. °· Intestine: appendicitis (inflamed appendix), regional ileitis (Crohn's disease), ulcerative colitis (inflamed colon), irritable bowel syndrome, diverticulitis (inflamed diverticulum of the colon), or cancer of the stomach or intestine. °· Gallbladder disease or stones in the gallbladder. °· Kidney disease, kidney stones, or infection. °· Pancreas infection or cancer. °· Fibromyalgia (pain disorder). °· Diseases of the female organs: °· Uterus: fibroid (non-cancerous) tumors or infection. °· Fallopian tubes: infection or tubal pregnancy. °· Ovary: cysts or tumors. °· Pelvic adhesions (scar tissue). °· Endometriosis (uterus lining tissue growing in the pelvis and on the pelvic organs). °· Pelvic congestion syndrome (female organs filling up with blood just before the menstrual period). °· Pain with the menstrual period. °· Pain with ovulation (producing an egg). °· Pain with an IUD (intrauterine device, birth control) in the uterus. °· Cancer of the female organs. °· Functional pain (pain not caused by a disease, may improve without treatment). °· Psychological pain. °· Depression. °DIAGNOSIS  °Your doctor will decide the seriousness of your pain by doing an examination. °· Blood tests. °· X-rays. °· Ultrasound. °· CT scan (computed tomography, special type of X-ray). °· MRI (magnetic resonance imaging). °· Cultures, for infection. °· Barium enema (dye inserted in the large intestine, to better view it with  X-rays). °· Colonoscopy (looking in intestine with a lighted tube). °· Laparoscopy (minor surgery, looking in abdomen with a lighted tube). °· Major abdominal exploratory surgery (looking in abdomen with a large incision). °TREATMENT  °The treatment will depend on the cause of the pain.  °· Many cases can be observed and treated at home. °· Over-the-counter medicines recommended by your caregiver. °· Prescription medicine. °· Antibiotics, for infection. °· Birth control pills, for painful periods or for ovulation pain. °· Hormone treatment, for endometriosis. °· Nerve blocking injections. °· Physical therapy. °· Antidepressants. °· Counseling with a psychologist or psychiatrist. °· Minor or major surgery. °HOME CARE INSTRUCTIONS  °· Do not take laxatives, unless directed by your caregiver. °· Take over-the-counter pain medicine only if ordered by your caregiver. Do not take aspirin because it can cause an upset stomach or bleeding. °· Try a clear liquid diet (broth or water) as ordered by your caregiver. Slowly move to a bland diet, as tolerated, if the pain is related to the stomach or intestine. °· Have a thermometer and take your temperature several times a day, and record it. °· Bed rest and sleep, if it helps the pain. °· Avoid sexual intercourse, if it causes pain. °· Avoid stressful situations. °· Keep your follow-up appointments and tests, as your caregiver orders. °· If the pain does not go away with medicine or surgery, you may try: °· Acupuncture. °· Relaxation exercises (yoga, meditation). °· Group therapy. °· Counseling. °SEEK MEDICAL CARE IF:  °· You notice certain foods cause stomach pain. °· Your home care treatment is not helping your pain. °· You need stronger pain medicine. °· You want your IUD removed. °· You feel faint or   lightheaded. °· You develop nausea and vomiting. °· You develop a rash. °· You are having side effects or an allergy to your medicine. °SEEK IMMEDIATE MEDICAL CARE IF:  °· Your  pain does not go away or gets worse. °· You have a fever. °· Your pain is felt only in portions of the abdomen. The right side could possibly be appendicitis. The left lower portion of the abdomen could be colitis or diverticulitis. °· You are passing blood in your stools (bright red or black tarry stools, with or without vomiting). °· You have blood in your urine. °· You develop chills, with or without a fever. °· You pass out. °MAKE SURE YOU:  °· Understand these instructions. °· Will watch your condition. °· Will get help right away if you are not doing well or get worse. °Document Released: 03/30/2007 Document Revised: 10/17/2013 Document Reviewed: 04/19/2009 °ExitCare® Patient Information ©2015 [employer]. This information is not intended to replace advice given to you by your health care provider. Make sure you discuss any questions you have with your health care provider. °Candida Infection °A Candida infection (also called yeast, fungus, and Monilia infection) is an overgrowth of yeast that can occur anywhere on the body. A yeast infection commonly occurs in warm, moist body areas. Usually, the infection remains localized but can spread to become a systemic infection. A yeast infection may be a sign of a more severe disease such as diabetes, leukemia, or AIDS. °A yeast infection can occur in both men and women. In women, Candida vaginitis is a vaginal infection. It is one of the most common causes of vaginitis. Men usually do not have symptoms or know they have an infection until other problems develop. Men may find out they have a yeast infection because their sex partner has a yeast infection. Uncircumcised men are more likely to get a yeast infection than circumcised men. This is because the uncircumcised glans is not exposed to air and does not remain as dry as that of a circumcised glans. Older adults may develop yeast infections around dentures. °CAUSES  °Women °· Antibiotics. °· Steroid medication  taken for a long time. °· Being overweight (obese). °· Diabetes. °· Poor immune condition. °· Certain serious medical conditions. °· Immune suppressive medications for organ transplant patients. °· Chemotherapy. °· Pregnancy. °· Menstruation. °· Stress and fatigue. °· Intravenous drug use. °· Oral contraceptives. °· Wearing tight-fitting clothes in the crotch area. °· Catching it from a sex partner who has a yeast infection. °· Spermicide. °· Intravenous, urinary, or other catheters. °Men °· Catching it from a sex partner who has a yeast infection. °· Having oral or anal sex with a person who has the infection. °· Spermicide. °· Diabetes. °· Antibiotics. °· Poor immune system. °· Medications that suppress the immune system. °· Intravenous drug use. °· Intravenous, urinary, or other catheters. °SYMPTOMS  °Women °· Thick, white vaginal discharge. °· Vaginal itching. °· Redness and swelling in and around the vagina. °· Irritation of the lips of the vagina and perineum. °· Blisters on the vaginal lips and perineum. °· Painful sexual intercourse. °· Low blood sugar (hypoglycemia). °· Painful urination. °· Bladder infections. °· Intestinal problems such as constipation, indigestion, bad breath, bloating, increase in gas, diarrhea, or loose stools. °Men °· Men may develop intestinal problems such as constipation, indigestion, bad breath, bloating, increase in gas, diarrhea, or loose stools. °· Dry, cracked skin on the penis with itching or discomfort. °· Jock itch. °· Dry, flaky skin. °·   skin.  Athlete's foot.  Hypoglycemia. DIAGNOSIS  Women  A history and an exam are performed.  The discharge may be examined under a microscope.  A culture may be taken of the discharge. Men  A history and an exam are performed.  Any discharge from the penis or areas of cracked skin will be looked at under the microscope and cultured.  Stool samples may be cultured. TREATMENT  Women  Vaginal antifungal suppositories  and creams.  Medicated creams to decrease irritation and itching on the outside of the vagina.  Warm compresses to the perineal area to decrease swelling and discomfort.  Oral antifungal medications.  Medicated vaginal suppositories or cream for repeated or recurrent infections.  Wash and dry the irritation areas before applying the cream.  Eating yogurt with Lactobacillus may help with prevention and treatment.  Sometimes painting the vagina with gentian violet solution may help if creams and suppositories do not work. Men  Antifungal creams and oral antifungal medications.  Sometimes treatment must continue for 30 days after the symptoms go away to prevent recurrence. HOME CARE INSTRUCTIONS  Women  Use cotton underwear and avoid tight-fitting clothing.  Avoid colored, scented toilet paper and deodorant tampons or pads.  Do not douche.  Keep your diabetes under control.  Finish all the prescribed medications.  Keep your skin clean and dry.  Consume milk or yogurt with Lactobacillus-active culture regularly. If you get frequent yeast infections and think that is what the infection is, there are over-the-counter medications that you can get. If the infection does not show healing in 3 days, talk to your caregiver.  Tell your sex partner you have a yeast infection. Your partner may need treatment also, especially if your infection does not clear up or recurs. Men  Keep your skin clean and dry.  Keep your diabetes under control.  Finish all prescribed medications.  Tell your sex partner that you have a yeast infection so he or she can be treated if necessary. SEEK MEDICAL CARE IF:   Your symptoms do not clear up or worsen in one week after treatment.  You have an oral temperature above 102 F (38.9 C).  You have trouble swallowing or eating for a prolonged time.  You develop blisters on and around your vagina.  You develop vaginal bleeding and it is not your  menstrual period.  You develop abdominal pain.  You develop intestinal problems as mentioned above.  You get weak or light-headed.  You have painful or increased urination.  You have pain during sexual intercourse. MAKE SURE YOU:   Understand these instructions.  Will watch your condition.  Will get help right away if you are not doing well or get worse. Document Released: 07/10/2004 Document Revised: 10/17/2013 Document Reviewed: 10/22/2009 [facility] Patient Information 2015 [place], [place]. This information is not intended to replace advice given to you by your health care provider. Make sure you discuss any questions you have with your health care provider. Urinary Tract Infection Urinary tract infections (UTIs) can develop anywhere along your urinary tract. Your urinary tract is your body's drainage system for removing wastes and extra water. Your urinary tract includes two kidneys, two ureters, a bladder, and a urethra. Your kidneys are a pair of bean-shaped organs. Each kidney is about the size of your fist. They are located below your ribs, one on each side of your spine. CAUSES Infections are caused by microbes, which are microscopic organisms, including fungi, viruses, and bacteria. These organisms are so  small that they can only be seen through a microscope. Bacteria are the microbes that most commonly cause UTIs. SYMPTOMS  Symptoms of UTIs may vary by age and gender of the patient and by the location of the infection. Symptoms in young women typically include a frequent and intense urge to urinate and a painful, burning feeling in the bladder or urethra during urination. Older women and men are more likely to be tired, shaky, and weak and have muscle aches and abdominal pain. A fever may mean the infection is in your kidneys. Other symptoms of a kidney infection include pain in your back or sides below the ribs, nausea, and vomiting. DIAGNOSIS To diagnose a UTI, your caregiver  will ask you about your symptoms. Your caregiver also will ask to provide a urine sample. The urine sample will be tested for bacteria and white blood cells. White blood cells are made by your body to help fight infection. TREATMENT  Typically, UTIs can be treated with medication. Because most UTIs are caused by a bacterial infection, they usually can be treated with the use of antibiotics. The choice of antibiotic and length of treatment depend on your symptoms and the type of bacteria causing your infection. HOME CARE INSTRUCTIONS  If you were prescribed antibiotics, take them exactly as your caregiver instructs you. Finish the medication even if you feel better after you have only taken some of the medication.  Drink enough water and fluids to keep your urine clear or pale yellow.  Avoid caffeine, tea, and carbonated beverages. They tend to irritate your bladder.  Empty your bladder often. Avoid holding urine for long periods of time.  Empty your bladder before and after sexual intercourse.  After a bowel movement, women should cleanse from front to back. Use each tissue only once. SEEK MEDICAL CARE IF:   You have back pain.  You develop a fever.  Your symptoms do not begin to resolve within 3 days. SEEK IMMEDIATE MEDICAL CARE IF:   You have severe back pain or lower abdominal pain.  You develop chills.  You have nausea or vomiting.  You have continued burning or discomfort with urination. MAKE SURE YOU:   Understand these instructions.  Will watch your condition.  Will get help right away if you are not doing well or get worse. Document Released: 03/12/2005 Document Revised: 12/02/2011 Document Reviewed: 07/11/2011 [facility] Patient Information 2015 [place], [place]. This information is not intended to replace advice given to you by your health care provider. Make sure you discuss any questions you have with your health care provider.

## 2014-08-11 ENCOUNTER — Emergency Department (HOSPITAL_BASED_OUTPATIENT_CLINIC_OR_DEPARTMENT_OTHER)
Admission: EM | Admit: 2014-08-11 | Discharge: 2014-08-11 | Disposition: A | Discharge status: Home / Self Care | Payer: Managed Care, Other (non HMO) | Attending: Emergency Medicine | Admitting: Emergency Medicine

## 2014-08-11 ENCOUNTER — Encounter (HOSPITAL_BASED_OUTPATIENT_CLINIC_OR_DEPARTMENT_OTHER): Payer: Self-pay

## 2014-08-11 DIAGNOSIS — Z720 Tobacco use: Secondary | ICD-10-CM | POA: Diagnosis not present

## 2014-08-11 DIAGNOSIS — G8929 Other chronic pain: Secondary | ICD-10-CM | POA: Insufficient documentation

## 2014-08-11 DIAGNOSIS — J45909 Unspecified asthma, uncomplicated: Secondary | ICD-10-CM | POA: Diagnosis not present

## 2014-08-11 DIAGNOSIS — G43909 Migraine, unspecified, not intractable, without status migrainosus: Secondary | ICD-10-CM | POA: Diagnosis not present

## 2014-08-11 DIAGNOSIS — K219 Gastro-esophageal reflux disease without esophagitis: Secondary | ICD-10-CM | POA: Insufficient documentation

## 2014-08-11 DIAGNOSIS — N3010 Interstitial cystitis (chronic) without hematuria: Secondary | ICD-10-CM

## 2014-08-11 DIAGNOSIS — R1033 Periumbilical pain: Secondary | ICD-10-CM | POA: Diagnosis present

## 2014-08-11 DIAGNOSIS — Z79899 Other long term (current) drug therapy: Secondary | ICD-10-CM | POA: Insufficient documentation

## 2014-08-11 DIAGNOSIS — D509 Iron deficiency anemia, unspecified: Secondary | ICD-10-CM | POA: Diagnosis not present

## 2014-08-11 LAB — URINALYSIS, ROUTINE W REFLEX MICROSCOPIC
Bilirubin Urine: NEGATIVE
GLUCOSE, UA: NEGATIVE mg/dL
Hgb urine dipstick: NEGATIVE
KETONES UR: NEGATIVE mg/dL
LEUKOCYTES UA: NEGATIVE
NITRITE: NEGATIVE
PH: 7 (ref 5.0–8.0)
Protein, ur: NEGATIVE mg/dL
SPECIFIC GRAVITY, URINE: 1.012 (ref 1.005–1.030)
Urobilinogen, UA: 1 mg/dL (ref 0.0–1.0)

## 2014-08-11 LAB — CBC WITH DIFFERENTIAL/PLATELET
BASOS PCT: 0 % (ref 0–1)
Basophils Absolute: 0 10*3/uL (ref 0.0–0.1)
EOS ABS: 0.1 10*3/uL (ref 0.0–0.7)
EOS PCT: 2 % (ref 0–5)
HEMATOCRIT: 26.3 % — AB (ref 36.0–46.0)
Hemoglobin: 8.6 g/dL — ABNORMAL LOW (ref 12.0–15.0)
Lymphocytes Relative: 23 % (ref 12–46)
Lymphs Abs: 1.8 10*3/uL (ref 0.7–4.0)
MCH: 24.2 pg — ABNORMAL LOW (ref 26.0–34.0)
MCHC: 32.7 g/dL (ref 30.0–36.0)
MCV: 74.1 fL — AB (ref 78.0–100.0)
Monocytes Absolute: 0.6 10*3/uL (ref 0.1–1.0)
Monocytes Relative: 8 % (ref 3–12)
NEUTROS PCT: 67 % (ref 43–77)
Neutro Abs: 5.1 10*3/uL (ref 1.7–7.7)
Platelets: 359 10*3/uL (ref 150–400)
RBC: 3.55 MIL/uL — ABNORMAL LOW (ref 3.87–5.11)
RDW: 20.4 % — AB (ref 11.5–15.5)
WBC: 7.6 10*3/uL (ref 4.0–10.5)

## 2014-08-11 LAB — COMPREHENSIVE METABOLIC PANEL
ALT: 15 U/L (ref 0–35)
AST: 20 U/L (ref 0–37)
Albumin: 3.5 g/dL (ref 3.5–5.2)
Alkaline Phosphatase: 59 U/L (ref 39–117)
Anion gap: 0 — ABNORMAL LOW (ref 5–15)
BUN: 10 mg/dL (ref 6–23)
CHLORIDE: 108 mmol/L (ref 96–112)
CO2: 25 mmol/L (ref 19–32)
Calcium: 8.2 mg/dL — ABNORMAL LOW (ref 8.4–10.5)
Creatinine, Ser: 0.43 mg/dL — ABNORMAL LOW (ref 0.50–1.10)
GFR calc Af Amer: >90 mL/min (ref >90)
GFR calc non Af Amer: >90 mL/min (ref >90)
GLUCOSE: 109 mg/dL — AB (ref 70–99)
POTASSIUM: 3.5 mmol/L (ref 3.5–5.1)
SODIUM: 133 mmol/L — AB (ref 135–145)
TOTAL PROTEIN: 6.1 g/dL (ref 6.0–8.3)
Total Bilirubin: 0.4 mg/dL (ref 0.3–1.2)

## 2014-08-11 LAB — GC/CHLAMYDIA PROBE AMP (~~LOC~~) NOT AT ARMC
CHLAMYDIA, DNA PROBE: NEGATIVE
NEISSERIA GONORRHEA: NEGATIVE

## 2014-08-11 MED ORDER — KETOROLAC TROMETHAMINE 60 MG/2ML IM SOLN
30.0000 mg | Freq: Once | INTRAMUSCULAR | Status: AC
  Administered 2014-08-11: 30 mg via INTRAMUSCULAR
  Filled 2014-08-11: qty 2

## 2014-08-11 MED ORDER — TRAMADOL HCL 50 MG PO TABS: 50.0000 mg | ORAL_TABLET | Freq: Four times a day (QID) | ORAL | Status: DC | PRN

## 2014-08-11 MED ORDER — PHENAZOPYRIDINE HCL 200 MG PO TABS: 200.0000 mg | ORAL_TABLET | Freq: Three times a day (TID) | ORAL | Status: DC

## 2014-08-11 NOTE — ED Notes (Signed)
At time of discharge Pt. Began to ask for pain meds and went thru a list of what does not work for her and then said she wanted "Percocet".  RN spoke with Pt. About her seeing Dr. Parke SimmersBland about the Percocet due to The EDP had written for alternative med her.

## 2014-08-11 NOTE — ED Notes (Addendum)
Pt reports upper abdominal pain for 2 days - states seen @ [facility] 2 days ago and treated for UTI/Yeast - reports currently taking Cipro for UTI. Reports intermittent nausea.

## 2014-08-11 NOTE — ED Provider Notes (Signed)
CSN: [id number]     Arrival date & time 08/11/14  1055 History   First MD Initiated Contact with Patient 08/11/14 1154     Chief Complaint  Patient presents with  . Abdominal Pain     (Consider location/radiation/quality/duration/timing/severity/associated sxs/prior Treatment) Patient is a 44 y.o. female presenting with abdominal pain. The history is provided by the patient.  Abdominal Pain Associated symptoms: no dysuria, no hematuria, no vaginal bleeding and no vaginal discharge    Jenny Evans is a 44 y.o. female who presents to the ED with abdominal pain. The pain started 1week ago. She was evaluated @ [facility] 2 days ago had pelvic exam, cultures and treated for UTI and yeast infection, she is taking Cipro. She complains of continued abdominal pain and nausea. Prior to that she was admitted for nausea and bloody diarrhea. At that time she had a blood transfusion. She took antibiotics and followed up with her PCP. That improved and then she started getting the UTI symptoms. She continues to have a nagging pain in the lower abdomen. She continues to have urgency and when she gets to the bathroom only goes a small amount but goes often. No diarrhea, no vomiting.  She is only sexually active with one female partner. Patient has taken nothing for pain. She has been drinking cranberry juice.   Past Medical History  Diagnosis Date  . Migraine   . GERD (gastroesophageal reflux disease)   . Ulcer   . Toothache   . Chronic pain   . Bronchitis   . Sickle cell trait   . Asthma    Past Surgical History  Procedure Laterality Date  . Myringotomy      age 44  . Esophagogastroduodenoscopy (egd) with propofol N/A 04/04/2013    Procedure: ESOPHAGOGASTRODUODENOSCOPY (EGD) WITH PROPOFOL;  Surgeon: Willis ModenaWilliam Outlaw, MD;  Location: [facility];  Service: Endoscopy;  Laterality: N/A;  . Examination under anesthesia N/A 08/10/2013    Procedure: RECTAL EXAM UNDER ANESTHESIA;  Surgeon: Clovis Puhomas A. Cornett,  MD;  Location: [facility];  Service: General;  Laterality: N/A;  ligation   Family History  Problem Relation Age of Onset  . Heart disease Mother   . Kidney disease Mother   . Hypertension Mother   . COPD Mother   . Diabetes Mother   . Heart disease Father   . Crohn's disease Maternal Aunt    History  Substance Use Topics  . Smoking status: Current Every Day Smoker -- 0.50 packs/day for 15 years    Types: Cigarettes  . Smokeless tobacco: Never Used  . Alcohol Use: Yes     Comment: very rare   OB History    No data available     Review of Systems  Gastrointestinal: Positive for abdominal pain.  Genitourinary: Positive for urgency, frequency and flank pain. Negative for dysuria, hematuria, vaginal bleeding, vaginal discharge and menstrual problem.  all other systems negative    Allergies  Hydrocodone-acetaminophen  Home Medications   Prior to Admission medications   Medication Sig Start Date End Date Taking? Authorizing Provider  esomeprazole (NEXIUM) 40 MG capsule Take 1 capsule (40 mg total) by mouth 2 (two) times daily before a meal. 04/04/13  Yes Willis ModenaWilliam Outlaw, MD  SUMAtriptan (IMITREX) 6 MG/0.5ML SOLN injection Inject 6 mg into the skin every 2 (two) hours as needed for migraine or headache (migraine). May repeat in 2 hours if headache persists or recurs.   Yes Historical Provider, MD  cephALEXin (KEFLEX) 500  MG capsule Take 1 capsule (500 mg total) by mouth 2 (two) times daily. 08/10/14   Derwood Kaplan, MD  ciprofloxacin (CIPRO) 500 MG tablet Take 1 tablet (500 mg total) by mouth 2 (two) times daily. Patient not taking: Reported on 08/10/2014 07/13/14   Alison Murray, MD  fluconazole (DIFLUCAN) 150 MG tablet Take 1 tablet (150 mg total) by mouth daily. 08/10/14 08/17/14  Derwood Kaplan, MD  metroNIDAZOLE (FLAGYL) 500 MG tablet Take 1 tablet (500 mg total) by mouth 3 (three) times daily. Patient not taking: Reported on 08/10/2014 07/13/14   Alison Murray, MD   oxyCODONE-acetaminophen (PERCOCET) 5-325 MG per tablet Take 1 tablet by mouth every 4 (four) hours as needed. Patient not taking: Reported on 08/10/2014 07/09/14   Gilda Crease, MD  phenazopyridine (PYRIDIUM) 200 MG tablet Take 1 tablet (200 mg total) by mouth 3 (three) times daily. 08/11/14   Tavaras Goody Orlene Och, NP  potassium chloride SA (K-DUR,KLOR-CON) 20 MEQ tablet Take 2 tablets (40 mEq total) by mouth daily. Patient not taking: Reported on 08/10/2014 07/09/14   Gilda Crease, MD  traMADol (ULTRAM) 50 MG tablet Take 1 tablet (50 mg total) by mouth every 6 (six) hours as needed. 08/11/14   Venessa Wickham Orlene Och, NP   BP 101/65 mmHg  Pulse 72  Temp(Src) 98.4 F (36.9 C) (Oral)  Resp 18  Ht  (1.626 m)  Wt 130 lb (58.968 kg)  BMI 22.30 kg/m2  SpO2 100%  LMP 07/30/2014 (Approximate) Physical Exam  Constitutional: She is oriented to person, place, and time. She appears well-developed and well-nourished. No distress.  HENT:  Head: Normocephalic and atraumatic.  Eyes: Conjunctivae and EOM are normal.  Neck: Neck supple.  Cardiovascular: Normal rate and regular rhythm.   Pulmonary/Chest: Effort normal.  Abdominal: Soft. Bowel sounds are normal. There is tenderness in the suprapubic area. There is no rebound, no guarding and no CVA tenderness.  Musculoskeletal: Normal range of motion.  Neurological: She is alert and oriented to person, place, and time. No cranial nerve deficit.  Skin: Skin is warm and dry.  Psychiatric: She has a normal mood and affect. Her behavior is normal.  Nursing note and vitals reviewed.   ED Course  Procedures Results for orders placed or performed during the hospital encounter of 08/11/14 (from the past 24 hour(s))  CBC with Differential/Platelet     Status: Abnormal   Collection Time: 08/11/14 12:35 PM  Result Value Ref Range   WBC 7.6 4.0 - 10.5 K/uL   RBC 3.55 (L) 3.87 - 5.11 MIL/uL   Hemoglobin 8.6 (L) 12.0 - 15.0 g/dL   HCT 40.9 (L) 81.1 - 91.4  %   MCV 74.1 (L) 78.0 - 100.0 fL   MCH 24.2 (L) 26.0 - 34.0 pg   MCHC 32.7 30.0 - 36.0 g/dL   RDW 78.2 (H) 95.6 - 21.3 %   Platelets 359 150 - 400 K/uL   Neutrophils Relative % 67 43 - 77 %   Neutro Abs 5.1 1.7 - 7.7 K/uL   Lymphocytes Relative 23 12 - 46 %   Lymphs Abs 1.8 0.7 - 4.0 K/uL   Monocytes Relative 8 3 - 12 %   Monocytes Absolute 0.6 0.1 - 1.0 K/uL   Eosinophils Relative 2 0 - 5 %   Eosinophils Absolute 0.1 0.0 - 0.7 K/uL   Basophils Relative 0 0 - 1 %   Basophils Absolute 0.0 0.0 - 0.1 K/uL  Comprehensive metabolic panel  Status: Abnormal   Collection Time: 08/11/14 12:35 PM  Result Value Ref Range   Sodium 133 (L) 135 - 145 mmol/L   Potassium 3.5 3.5 - 5.1 mmol/L   Chloride 108 96 - 112 mmol/L   CO2 25 19 - 32 mmol/L   Glucose, Bld 109 (H) 70 - 99 mg/dL   BUN 10 6 - 23 mg/dL   Creatinine, Ser 1.61 (L) 0.50 - 1.10 mg/dL   Calcium 8.2 (L) 8.4 - 10.5 mg/dL   Total Protein 6.1 6.0 - 8.3 g/dL   Albumin 3.5 3.5 - 5.2 g/dL   AST 20 0 - 37 U/L   ALT 15 0 - 35 U/L   Alkaline Phosphatase 59 39 - 117 U/L   Total Bilirubin 0.4 0.3 - 1.2 mg/dL   GFR calc non Af Amer >90 >90 mL/min   GFR calc Af Amer >90 >90 mL/min   Anion gap 0 (L) 5 - 15  Urinalysis, Routine w reflex microscopic     Status: None   Collection Time: 08/11/14 12:35 PM  Result Value Ref Range   Color, Urine YELLOW YELLOW   APPearance CLEAR CLEAR   Specific Gravity, Urine 1.012 1.005 - 1.030   pH 7.0 5.0 - 8.0   Glucose, UA NEGATIVE NEGATIVE mg/dL   Hgb urine dipstick NEGATIVE NEGATIVE   Bilirubin Urine NEGATIVE NEGATIVE   Ketones, ur NEGATIVE NEGATIVE mg/dL   Protein, ur NEGATIVE NEGATIVE mg/dL   Urobilinogen, UA 1.0 0.0 - 1.0 mg/dL   Nitrite NEGATIVE NEGATIVE   Leukocytes, UA NEGATIVE NEGATIVE    MDM  44 y.o. female with suprapubic pain and UTI symptoms. As I was going over d/c instructions with the patient she requested something for pain. I told her I would write for Tramadol until she can  follow up with Dr. Parke Simmers on Monday. She states that Tramadol does not help and she is allergic to hydrocodone and request Percocet. She has been on Percocet since she was in the hospital and ran out yesterday. Discussed with the patient she will need to discuss chronic pain management with her PCP. Stable for d/c with normal blood results (except for anemia) and normal urine. Discussed with the patient possible interstitial cystitis and need for follow up. She voices understanding.   Final diagnoses:  Cystitis, interstitial  Periumbilical pain       Janne Napoleon, NP 08/11/14 2023  Audree Camel, MD 08/12/14 [phone]

## 2014-08-11 NOTE — Discharge Instructions (Signed)
You will need to follow up with Dr. Parke SimmersBland for your abdominal pain and continued Urinary symptoms. You may have a condition call interstitial cystitis. We are giving you information about that today. Your blood work and urine were normal except you are anemic. Continue to take your medications as prescribed and make your follow up appointment with Dr. Parke SimmersBland.

## 2014-08-11 NOTE — ED Notes (Signed)
Pt. Reports she was on an antibiotics and it caused her to have a yeast infection.  Pt.  Reports she is drinking a lot of water and cranberry juice and still having urinary frequency and urgency.

## 2014-08-13 LAB — URINE CULTURE: Special Requests: NORMAL

## 2014-08-14 ENCOUNTER — Telehealth (HOSPITAL_BASED_OUTPATIENT_CLINIC_OR_DEPARTMENT_OTHER): Payer: Self-pay | Admitting: Emergency Medicine

## 2014-08-14 NOTE — Telephone Encounter (Signed)
Post ED Visit - Positive Culture Follow-up  Culture report reviewed by antimicrobial stewardship pharmacist: []  Wes Dulaney, Pharm.D., BCPS [x]  Celedonio MiyamotoJeremy Frens, Pharm.D., BCPS []  Georgina PillionElizabeth Martin, Pharm.D., BCPS []  ElbeMinh Pham, VermontPharm[place]D., BCPS, AAHIVP []  Estella HuskMichelle Turner, Pharm.D., BCPS, AAHIVP []  Elder CyphersLorie Poole, [address]Pharm[address]D., BCPS  Positive urine culture Staph Treated with cephalexin, fluconazole, nitrofuratoin, organism sensitive to the same and no further patient follow-up is required at this time.  Berle MullMiller, Manoj Enriquez 08/14/2014, 10:45 AM

## 2014-08-22 ENCOUNTER — Encounter (HOSPITAL_COMMUNITY): Payer: Self-pay | Admitting: Emergency Medicine

## 2014-08-22 ENCOUNTER — Emergency Department (HOSPITAL_COMMUNITY)
Admission: EM | Admit: 2014-08-22 | Discharge: 2014-08-22 | Disposition: A | Discharge status: Home / Self Care | Payer: Managed Care, Other (non HMO) | Attending: Emergency Medicine | Admitting: Emergency Medicine

## 2014-08-22 ENCOUNTER — Emergency Department (HOSPITAL_COMMUNITY): Payer: Managed Care, Other (non HMO)

## 2014-08-22 DIAGNOSIS — J45909 Unspecified asthma, uncomplicated: Secondary | ICD-10-CM | POA: Diagnosis not present

## 2014-08-22 DIAGNOSIS — Z862 Personal history of diseases of the blood and blood-forming organs and certain disorders involving the immune mechanism: Secondary | ICD-10-CM | POA: Diagnosis not present

## 2014-08-22 DIAGNOSIS — Y998 Other external cause status: Secondary | ICD-10-CM | POA: Diagnosis not present

## 2014-08-22 DIAGNOSIS — G8929 Other chronic pain: Secondary | ICD-10-CM | POA: Diagnosis not present

## 2014-08-22 DIAGNOSIS — X58XXXA Exposure to other specified factors, initial encounter: Secondary | ICD-10-CM | POA: Diagnosis not present

## 2014-08-22 DIAGNOSIS — Y9389 Activity, other specified: Secondary | ICD-10-CM | POA: Insufficient documentation

## 2014-08-22 DIAGNOSIS — G43909 Migraine, unspecified, not intractable, without status migrainosus: Secondary | ICD-10-CM | POA: Insufficient documentation

## 2014-08-22 DIAGNOSIS — Z720 Tobacco use: Secondary | ICD-10-CM | POA: Diagnosis not present

## 2014-08-22 DIAGNOSIS — Z79899 Other long term (current) drug therapy: Secondary | ICD-10-CM | POA: Insufficient documentation

## 2014-08-22 DIAGNOSIS — K219 Gastro-esophageal reflux disease without esophagitis: Secondary | ICD-10-CM | POA: Insufficient documentation

## 2014-08-22 DIAGNOSIS — S4991XA Unspecified injury of right shoulder and upper arm, initial encounter: Secondary | ICD-10-CM | POA: Diagnosis not present

## 2014-08-22 DIAGNOSIS — Z792 Long term (current) use of antibiotics: Secondary | ICD-10-CM | POA: Insufficient documentation

## 2014-08-22 DIAGNOSIS — Y9289 Other specified places as the place of occurrence of the external cause: Secondary | ICD-10-CM | POA: Insufficient documentation

## 2014-08-22 DIAGNOSIS — S29092A Other injury of muscle and tendon of back wall of thorax, initial encounter: Secondary | ICD-10-CM | POA: Diagnosis present

## 2014-08-22 MED ORDER — CYCLOBENZAPRINE HCL 10 MG PO TABS: 10.0000 mg | ORAL_TABLET | Freq: Two times a day (BID) | ORAL | Status: DC | PRN

## 2014-08-22 MED ORDER — CYCLOBENZAPRINE HCL 10 MG PO TABS
5.0000 mg | ORAL_TABLET | Freq: Once | ORAL | Status: AC
Start: 2014-08-22 — End: 2014-08-22
  Administered 2014-08-22: 5 mg via ORAL
  Filled 2014-08-22: qty 1

## 2014-08-22 MED ORDER — OXYCODONE-ACETAMINOPHEN 5-325 MG PO TABS: 1.0000 | ORAL_TABLET | Freq: Three times a day (TID) | ORAL | Status: DC | PRN

## 2014-08-22 MED ORDER — IBUPROFEN 800 MG PO TABS
800.0000 mg | ORAL_TABLET | Freq: Once | ORAL | Status: AC
  Administered 2014-08-22: 800 mg via ORAL
  Filled 2014-08-22: qty 1

## 2014-08-22 MED ORDER — OXYCODONE-ACETAMINOPHEN 5-325 MG PO TABS
1.0000 | ORAL_TABLET | Freq: Once | ORAL | Status: AC
  Administered 2014-08-22: 1 via ORAL
  Filled 2014-08-22: qty 1

## 2014-08-22 NOTE — ED Notes (Signed)
Pt states was lifting boxes at work when upper right back started hurting. Coworker "crack" her back and felt better. Pt states when got out of bath this morning pain returned.

## 2014-08-22 NOTE — Discharge Instructions (Signed)
Acromioclavicular Injuries °The AC (acromioclavicular) joint is the joint in the shoulder where the collarbone (clavicle) meets the shoulder blade (scapula). The part of the shoulder blade connected to the collarbone is called the acromion. Common problems with and treatments for the AC joint are detailed below. °ARTHRITIS °Arthritis occurs when the joint has been injured and the smooth padding between the joints (cartilage) is lost. This is the wear and tear seen in most joints of the body if they have been overused. This causes the joint to produce pain and swelling which is worse with activity.  °AC JOINT SEPARATION °AC joint separation means that the ligaments connecting the acromion of the shoulder blade and collarbone have been damaged, and the two bones no longer line up. AC separations can be anywhere from mild to severe, and are "graded" depending upon which ligaments are torn and how badly they are torn. °· Grade I Injury: the least damage is done, and the AC joint still lines up. °· Grade II Injury: damage to the ligaments which reinforce the AC joint. In a Grade II injury, these ligaments are stretched but not entirely torn. When stressed, the AC joint becomes painful and unstable. °· Grade III Injury: AC and secondary ligaments are completely torn, and the collarbone is no longer attached to the shoulder blade. This results in deformity; a prominence of the end of the clavicle. °AC JOINT FRACTURE °AC joint fracture means that there has been a break in the bones of the AC joint, usually the end of the clavicle. °TREATMENT °TREATMENT OF AC ARTHRITIS °· There is currently no way to replace the cartilage damaged by arthritis. The best way to improve the condition is to decrease the activities which aggravate the problem. Application of ice to the joint helps decrease pain and soreness (inflammation). The use of non-steroidal anti-inflammatory medication is helpful. °· If less conservative measures do not  work, then cortisone shots (injections) may be used. These are anti-inflammatories; they decrease the soreness in the joint and swelling. °· If non-surgical measures fail, surgery may be recommended. The procedure is generally removal of a portion of the end of the clavicle. This is the part of the collarbone closest to your acromion which is stabilized with ligaments to the acromion of the shoulder blade. This surgery may be performed using a tube-like instrument with a light (arthroscope) for looking into a joint. It may also be performed as an open surgery through a small incision by the surgeon. Most patients will have good range of motion within 6 weeks and may return to all activity including sports by 8-12 weeks, barring complications. °TREATMENT OF AN AC SEPARATION °· The initial treatment is to decrease pain. This is best accomplished by immobilizing the arm in a sling and placing an ice pack to the shoulder for 20 to 30 minutes every 2 hours as needed. As the pain starts to subside, it is important to begin moving the fingers, wrist, elbow and eventually the shoulder in order to prevent a stiff or "frozen" shoulder. Instruction on when and how much to move the shoulder will be provided by your caregiver. The length of time needed to regain full motion and function depends on the amount or grade of the injury. Recovery from a Grade I AC separation usually takes 10 to 14 days, whereas a Grade III may take 6 to 8 weeks. °· Grade I and II separations usually do not require surgery. Even Grade III injuries usually allow return to full   activity with few restrictions. Treatment is also based on the activity demands of the injured shoulder. For example, a high level quarterback with an injured throwing arm will receive more aggressive treatment than someone with a desk job who rarely uses his/her arm for strenuous activities. In some cases, a painful lump may persist which could require a later surgery. Surgery  can be very successful, but the benefits must be weighed against the potential risks. °TREATMENT OF AN AC JOINT FRACTURE °Fracture treatment depends on the type of fracture. Sometimes a splint or sling may be all that is required. Other times surgery may be required for repair. This is more frequently the case when the ligaments supporting the clavicle are completely torn. Your caregiver will help you with these decisions and together you can decide what will be the best treatment. °HOME CARE INSTRUCTIONS  °· Apply ice to the injury for 15-20 minutes each hour while awake for 2 days. Put the ice in a plastic bag and place a towel between the bag of ice and skin. °· If a sling has been applied, wear it constantly for as long as directed by your caregiver, even at night. The sling or splint can be removed for bathing or showering or as directed. Be sure to keep the shoulder in the same place as when the sling is on. Do not lift the arm. °· If a figure-of-eight splint has been applied it should be tightened gently by another person every day. Tighten it enough to keep the shoulders held back. Allow enough room to place the index finger between the body and strap. Loosen the splint immediately if there is numbness or tingling in the hands. °· Take over-the-counter or prescription medicines for pain, discomfort or fever as directed by your caregiver. °· If you or your child has received a follow up appointment, it is very important to keep that appointment in order to avoid long term complications, chronic pain or disability. °SEEK MEDICAL CARE IF:  °· The pain is not relieved with medications. °· There is increased swelling or discoloration that continues to get worse rather than better. °· You or your child has been unable to follow up as instructed. °· There is progressive numbness and tingling in the arm, forearm or hand. °SEEK IMMEDIATE MEDICAL CARE IF:  °· The arm is numb, cold or pale. °· There is increasing pain  in the hand, forearm or fingers. °MAKE SURE YOU:  °· Understand these instructions. °· Will watch your condition. °· Will get help right away if you are not doing well or get worse. °Document Released: 03/12/2005 Document Revised: 08/25/2011 Document Reviewed: 09/04/2008 °[employer]® Patient Information ©2015 [employer]. This information is not intended to replace advice given to you by your health care provider. Make sure you discuss any questions you have with your health care provider. ° °

## 2014-08-22 NOTE — ED Provider Notes (Signed)
CSN: [id number]     Arrival date & time 08/22/14  1306 History  This chart was scribed for non-physician practitioner Marlon Peliffany Suhailah Kwan, PA-C working with Blake DivineJohn Wofford, MD by Littie Deedsichard Sun, ED Scribe. This patient was seen in room WTR8/WTR8 and the patient's care was started at 2:26 PM.       Chief Complaint  Patient presents with  . Back Pain   The history is provided by the patient. No language interpreter was used.   HPI Comments: Jenny Evans is a 44 y.o. female  who presents to the Emergency Department complaining of sudden onset right upper back pain under her scapula that started yesterday when lifting boxes at work. Patient thinks she may have lifted a box incorrectly and heard a pop. She notes that her coworker "cracked" her back to help alleviate the pain which did initially provide relief; however, she notes that her pain worsened this morning. The pain is worse with certain positions and breathing or moving her shoulder at all. She denies elbow or wrist pain. Denies loc, head or neck pain. No other injuries.   Past Medical History  Diagnosis Date  . Migraine   . GERD (gastroesophageal reflux disease)   . Ulcer   . Toothache   . Chronic pain   . Bronchitis   . Sickle cell trait   . Asthma    Past Surgical History  Procedure Laterality Date  . Myringotomy      age 44  . Esophagogastroduodenoscopy (egd) with propofol N/A 04/04/2013    Procedure: ESOPHAGOGASTRODUODENOSCOPY (EGD) WITH PROPOFOL;  Surgeon: Willis ModenaWilliam Outlaw, MD;  Location: [facility];  Service: Endoscopy;  Laterality: N/A;  . Examination under anesthesia N/A 08/10/2013    Procedure: RECTAL EXAM UNDER ANESTHESIA;  Surgeon: Clovis Puhomas A. Cornett, MD;  Location: [facility];  Service: General;  Laterality: N/A;  ligation   Family History  Problem Relation Age of Onset  . Heart disease Mother   . Kidney disease Mother   . Hypertension Mother   . COPD Mother   . Diabetes Mother   . Heart disease Father    . Crohn's disease Maternal Aunt    History  Substance Use Topics  . Smoking status: Current Every Day Smoker -- 0.50 packs/day for 15 years    Types: Cigarettes  . Smokeless tobacco: Never Used  . Alcohol Use: Yes     Comment: very rare   OB History    No data available     Review of Systems  Musculoskeletal: Positive for back pain.   A complete 10 system review of systems was obtained and all systems are negative except as noted in the HPI and PMH.     Allergies  Hydrocodone-acetaminophen  Home Medications   Prior to Admission medications   Medication Sig Start Date End Date Taking? Authorizing Provider  cephALEXin (KEFLEX) 500 MG capsule Take 1 capsule (500 mg total) by mouth 2 (two) times daily. 08/10/14   Derwood KaplanAnkit Nanavati, MD  ciprofloxacin (CIPRO) 500 MG tablet Take 1 tablet (500 mg total) by mouth 2 (two) times daily. Patient not taking: Reported on 08/10/2014 07/13/14   Alison MurrayAlma M Devine, MD  cyclobenzaprine (FLEXERIL) 10 MG tablet Take 1 tablet (10 mg total) by mouth 2 (two) times daily as needed for muscle spasms. 08/22/14   Oniyah Rohe Neva SeatGreene, PA-C  esomeprazole (NEXIUM) 40 MG capsule Take 1 capsule (40 mg total) by mouth 2 (two) times daily before a meal. 04/04/13   Willis ModenaWilliam Outlaw, MD  metroNIDAZOLE (FLAGYL) 500 MG tablet Take 1 tablet (500 mg total) by mouth 3 (three) times daily. Patient not taking: Reported on 08/10/2014 07/13/14   Alison Murray, MD  oxyCODONE-acetaminophen (PERCOCET) 5-325 MG per tablet Take 1 tablet by mouth every 4 (four) hours as needed. Patient not taking: Reported on 08/10/2014 07/09/14   Gilda Crease, MD  oxyCODONE-acetaminophen (PERCOCET/ROXICET) 5-325 MG per tablet Take 1 tablet by mouth every 8 (eight) hours as needed for severe pain. 08/22/14   Jacquelynn Friend Neva Seat, PA-C  phenazopyridine (PYRIDIUM) 200 MG tablet Take 1 tablet (200 mg total) by mouth 3 (three) times daily. 08/11/14   Hope Orlene Och, NP  potassium chloride SA (K-DUR,KLOR-CON) 20 MEQ tablet  Take 2 tablets (40 mEq total) by mouth daily. Patient not taking: Reported on 08/10/2014 07/09/14   Gilda Crease, MD  SUMAtriptan (IMITREX) 6 MG/0.5ML SOLN injection Inject 6 mg into the skin every 2 (two) hours as needed for migraine or headache (migraine). May repeat in 2 hours if headache persists or recurs.    Historical Provider, MD  traMADol (ULTRAM) 50 MG tablet Take 1 tablet (50 mg total) by mouth every 6 (six) hours as needed. 08/11/14   Hope Orlene Och, NP   BP 110/72 mmHg  Pulse 85  Temp(Src) 98 F (36.7 C) (Oral)  Resp 14  SpO2 99%  LMP 07/30/2014 (Approximate) Physical Exam  Constitutional: She is oriented to person, place, and time. She appears well-developed and well-nourished. No distress.  HENT:  Head: Normocephalic and atraumatic.  Mouth/Throat: Oropharynx is clear and moist. No oropharyngeal exudate.  Eyes: Pupils are equal, round, and reactive to light.  Neck: Neck supple. No spinous process tenderness and no muscular tenderness present.  Cardiovascular: Normal rate.   Pulmonary/Chest: Effort normal. She exhibits no tenderness.  Musculoskeletal: She exhibits tenderness. She exhibits no edema.  Pain with ROM of right shoulder. No pain to the elbow wrist or fingers. Normal grip strength. No tenderness to the ribs, cervical or thoracic spine. Tenderness is most severe over the scapula.  Neurological: She is alert and oriented to person, place, and time. No cranial nerve deficit.  Skin: Skin is warm and dry. No rash noted.  Psychiatric: She has a normal mood and affect. Her behavior is normal.  Nursing note and vitals reviewed.   ED Course  Procedures  DIAGNOSTIC STUDIES: Oxygen Saturation is 98% on room air, normal by my interpretation.    COORDINATION OF CARE: 2:31 PM-Discussed treatment plan which includes XR back and pain medication with pt at bedside and pt agreed to plan. Will also give patient muscle relaxer and anti-inflammatory medications. Will apply  shoulder sling. Patient is to apply ice, rest, and follow-up with orthopedics if pain is not improving in the next few days. Pain made worse with palpation and movement- sling has significantly helped relieve symptoms.   Labs Review Labs Reviewed - No data to display  Imaging Review Dg Shoulder Right  08/22/2014   CLINICAL DATA:  right shoulder pain. Pt states a co-worker "bear hugged me" to pop her back and felt more intense pain in right shoulder. When showering this morning, pt went to turn her body and right shoulder popped. Right shoulder pain radiating into scapula also. Pt unable to stand up straight for images. She is leaning forward some due to severe pain. Best obtainable images due to pt condition and pt in pain.  EXAM: RIGHT SHOULDER - 2+ VIEW  COMPARISON:  10/20/2009  FINDINGS: There is no  evidence of fracture or dislocation. There is no evidence of arthropathy or other focal bone abnormality. Soft tissues are unremarkable.  IMPRESSION: Negative.   Electronically Signed   By: Corlis Leak M.D.   On: 08/22/2014 14:56     EKG Interpretation None      MDM   Final diagnoses:  Shoulder injury, right, initial encounter    44 y.o.Jenny Evans's evaluation in the Emergency Department is complete. It has been determined that no acute conditions requiring further emergency intervention are present at this time. The patient/guardian have been advised of the diagnosis and plan. We have discussed signs and symptoms that warrant return to the ED, such as changes or worsening in symptoms.  Vital signs are stable at discharge. Filed Vitals:   08/22/14 1512  BP:   Pulse: 85  Temp:   Resp:     Patient/guardian has voiced understanding and agreed to follow-up with the PCP or specialist.   I personally performed the services described in this documentation, which was scribed in my presence. The recorded information has been reviewed and is accurate.    [name], PA-C 08/22/14  1518  Blake Divine, MD 08/22/14 [phone]

## 2014-08-23 LAB — STOOL CULTURE

## 2014-10-11 ENCOUNTER — Emergency Department (HOSPITAL_COMMUNITY)
Admission: EM | Admit: 2014-10-11 | Discharge: 2014-10-12 | Disposition: A | Discharge status: Home / Self Care | Payer: Managed Care, Other (non HMO) | Attending: Emergency Medicine | Admitting: Emergency Medicine

## 2014-10-11 DIAGNOSIS — G43909 Migraine, unspecified, not intractable, without status migrainosus: Secondary | ICD-10-CM | POA: Insufficient documentation

## 2014-10-11 DIAGNOSIS — G8929 Other chronic pain: Secondary | ICD-10-CM | POA: Insufficient documentation

## 2014-10-11 DIAGNOSIS — S301XXA Contusion of abdominal wall, initial encounter: Secondary | ICD-10-CM | POA: Diagnosis not present

## 2014-10-11 DIAGNOSIS — Z792 Long term (current) use of antibiotics: Secondary | ICD-10-CM | POA: Diagnosis not present

## 2014-10-11 DIAGNOSIS — Y998 Other external cause status: Secondary | ICD-10-CM | POA: Diagnosis not present

## 2014-10-11 DIAGNOSIS — W19XXXA Unspecified fall, initial encounter: Secondary | ICD-10-CM

## 2014-10-11 DIAGNOSIS — K219 Gastro-esophageal reflux disease without esophagitis: Secondary | ICD-10-CM | POA: Insufficient documentation

## 2014-10-11 DIAGNOSIS — Y9289 Other specified places as the place of occurrence of the external cause: Secondary | ICD-10-CM | POA: Insufficient documentation

## 2014-10-11 DIAGNOSIS — Z720 Tobacco use: Secondary | ICD-10-CM | POA: Insufficient documentation

## 2014-10-11 DIAGNOSIS — J45909 Unspecified asthma, uncomplicated: Secondary | ICD-10-CM | POA: Insufficient documentation

## 2014-10-11 DIAGNOSIS — Y9389 Activity, other specified: Secondary | ICD-10-CM | POA: Diagnosis not present

## 2014-10-11 DIAGNOSIS — Z79899 Other long term (current) drug therapy: Secondary | ICD-10-CM | POA: Insufficient documentation

## 2014-10-11 DIAGNOSIS — S79911A Unspecified injury of right hip, initial encounter: Secondary | ICD-10-CM | POA: Diagnosis not present

## 2014-10-11 DIAGNOSIS — Z862 Personal history of diseases of the blood and blood-forming organs and certain disorders involving the immune mechanism: Secondary | ICD-10-CM | POA: Diagnosis not present

## 2014-10-11 DIAGNOSIS — W01198A Fall on same level from slipping, tripping and stumbling with subsequent striking against other object, initial encounter: Secondary | ICD-10-CM | POA: Diagnosis not present

## 2014-10-11 DIAGNOSIS — S3991XA Unspecified injury of abdomen, initial encounter: Secondary | ICD-10-CM | POA: Diagnosis present

## 2014-10-12 ENCOUNTER — Encounter (HOSPITAL_COMMUNITY): Payer: Self-pay | Admitting: *Deleted

## 2014-10-12 ENCOUNTER — Emergency Department (HOSPITAL_COMMUNITY): Payer: Managed Care, Other (non HMO)

## 2014-10-12 MED ORDER — IBUPROFEN 600 MG PO TABS: 600.0000 mg | ORAL_TABLET | Freq: Four times a day (QID) | ORAL | Status: DC | PRN

## 2014-10-12 MED ORDER — TRAMADOL HCL 50 MG PO TABS: 50.0000 mg | ORAL_TABLET | Freq: Four times a day (QID) | ORAL | Status: DC | PRN

## 2014-10-12 MED ORDER — TRAMADOL HCL 50 MG PO TABS
50.0000 mg | ORAL_TABLET | Freq: Once | ORAL | Status: AC
  Administered 2014-10-12: 50 mg via ORAL
  Filled 2014-10-12: qty 1

## 2014-10-12 NOTE — Discharge Instructions (Signed)

## 2014-10-12 NOTE — ED Notes (Signed)
Pt states that she fell on a skid this pm and struck rt hip; pt with bruising to rt hip area; pt able to ambulate without difficulty; pt c/o pain to area

## 2014-10-12 NOTE — ED Provider Notes (Signed)
CSN: [id number]     Arrival date & time 10/11/14  2352 History   First MD Initiated Contact with Patient 10/12/14 0100     Chief Complaint  Patient presents with  . Hip Pain     (Consider location/radiation/quality/duration/timing/severity/associated sxs/prior Treatment) HPI Patient presents after falling from standing position onto a pallet landing on her right hip. Patient sustained contusion to the hip with swelling. Injury occurred around 10:30 PM. She denies any other injuries. No head or neck injury. No loss of consciousness. No nausea or vomiting. No focal weakness or numbness. No back pain. Ambulating without difficulty. Past Medical History  Diagnosis Date  . Migraine   . GERD (gastroesophageal reflux disease)   . Ulcer   . Toothache   . Chronic pain   . Bronchitis   . Sickle cell trait   . Asthma    Past Surgical History  Procedure Laterality Date  . Myringotomy      age 44  . Esophagogastroduodenoscopy (egd) with propofol N/A 04/04/2013    Procedure: ESOPHAGOGASTRODUODENOSCOPY (EGD) WITH PROPOFOL;  Surgeon: Willis Modena, MD;  Location: [facility];  Service: Endoscopy;  Laterality: N/A;  . Examination under anesthesia N/A 08/10/2013    Procedure: RECTAL EXAM UNDER ANESTHESIA;  Surgeon: Clovis Pu. Cornett, MD;  [facility];  Service: General;  Laterality: N/A;  ligation   Family History  Problem Relation Age of Onset  . Heart disease Mother   . Kidney disease Mother   . Hypertension Mother   . COPD Mother   . Diabetes Mother   . Heart disease Father   . Crohn's disease Maternal Aunt    History  Substance Use Topics  . Smoking status: Current Every Day Smoker -- 0.50 packs/day for 15 years    Types: Cigarettes  . Smokeless tobacco: Never Used  . Alcohol Use: Yes     Comment: very rare   OB History    No data available     Review of Systems  Constitutional: Negative for fever and chills.  Respiratory: Negative for shortness of  breath.   Cardiovascular: Negative for chest pain.  Gastrointestinal: Negative for nausea, vomiting and abdominal pain.  Musculoskeletal: Positive for arthralgias. Negative for back pain, neck pain and neck stiffness.  Skin: Positive for wound. Negative for rash.  Neurological: Negative for dizziness, weakness, light-headedness, numbness and headaches.  All other systems reviewed and are negative.     Allergies  Hydrocodone-acetaminophen  Home Medications   Prior to Admission medications   Medication Sig Start Date End Date Taking? Authorizing Provider  cephALEXin (KEFLEX) 500 MG capsule Take 1 capsule (500 mg total) by mouth 2 (two) times daily. 08/10/14   Derwood Kaplan, MD  ciprofloxacin (CIPRO) 500 MG tablet Take 1 tablet (500 mg total) by mouth 2 (two) times daily. Patient not taking: Reported on 08/10/2014 07/13/14   Alison Murray, MD  cyclobenzaprine (FLEXERIL) 10 MG tablet Take 1 tablet (10 mg total) by mouth 2 (two) times daily as needed for muscle spasms. 08/22/14   Tiffany Neva Seat, PA-C  esomeprazole (NEXIUM) 40 MG capsule Take 1 capsule (40 mg total) by mouth 2 (two) times daily before a meal. 04/04/13   Willis Modena, MD  ibuprofen (ADVIL,MOTRIN) 600 MG tablet Take 1 tablet (600 mg total) by mouth every 6 (six) hours as needed. 10/12/14   Loren Racer, MD  metroNIDAZOLE (FLAGYL) 500 MG tablet Take 1 tablet (500 mg total) by mouth 3 (three) times daily. Patient not taking:  Reported on 08/10/2014 07/13/14   Alison Murray, MD  oxyCODONE-acetaminophen (PERCOCET) 5-325 MG per tablet Take 1 tablet by mouth every 4 (four) hours as needed. Patient not taking: Reported on 08/10/2014 07/09/14   Gilda Crease, MD  oxyCODONE-acetaminophen (PERCOCET/ROXICET) 5-325 MG per tablet Take 1 tablet by mouth every 8 (eight) hours as needed for severe pain. 08/22/14   Tiffany Neva Seat, PA-C  phenazopyridine (PYRIDIUM) 200 MG tablet Take 1 tablet (200 mg total) by mouth 3 (three) times daily. 08/11/14    Hope Orlene Och, NP  potassium chloride SA (K-DUR,KLOR-CON) 20 MEQ tablet Take 2 tablets (40 mEq total) by mouth daily. Patient not taking: Reported on 08/10/2014 07/09/14   Gilda Crease, MD  SUMAtriptan (IMITREX) 6 MG/0.5ML SOLN injection Inject 6 mg into the skin every 2 (two) hours as needed for migraine or headache (migraine). May repeat in 2 hours if headache persists or recurs.    Historical Provider, MD  traMADol (ULTRAM) 50 MG tablet Take 1 tablet (50 mg total) by mouth every 6 (six) hours as needed for severe pain. 10/12/14   Loren Racer, MD   BP 109/70 mmHg  Pulse 73  Temp(Src) 98 F (36.7 C) (Oral)  Resp 15  Ht  (1.626 m)  Wt 129 lb (58.514 kg)  BMI 22.13 kg/m2  SpO2 100%  LMP 09/28/2014 Physical Exam  Constitutional: She is oriented to person, place, and time. She appears well-developed and well-nourished. No distress.  HENT:  Head: Normocephalic and atraumatic.  Mouth/Throat: Oropharynx is clear and moist.  Eyes: EOM are normal. Pupils are equal, round, and reactive to light.  Neck: Normal range of motion. Neck supple.  No posterior midline cervical tenderness to palpation.  Cardiovascular: Normal rate and regular rhythm.  Exam reveals no gallop and no friction rub.   No murmur heard. Pulmonary/Chest: Effort normal and breath sounds normal. No respiratory distress. She has no wheezes. She has no rales. She exhibits no tenderness.  Abdominal: Soft. Bowel sounds are normal. She exhibits no distension and no mass. There is tenderness. There is no rebound and no guarding.  Patient with contusion and swelling to the right lower abdominal wall. Tenderness to palpation.  Musculoskeletal: Normal range of motion. She exhibits tenderness. She exhibits no edema.  Patient with tenderness to palpation over the right iliac crest. Chest full range of motion of the right hip area distal pulses intact. No thoracic or lumbar tenderness to palpation.  Neurological: She is alert  and oriented to person, place, and time.  5/5 motor in all extremities. Sensation is fully intact.  Skin: Skin is warm and dry. No rash noted. No erythema.  Psychiatric: She has a normal mood and affect. Her behavior is normal.  Nursing note and vitals reviewed.   ED Course  Procedures (including critical care time) Labs Review Labs Reviewed - No data to display  Imaging Review Dg Hip Unilat With Pelvis 2-3 Views Right  10/12/2014   CLINICAL DATA:  44 year old female with right hip pain after falling earlier today  EXAM: RIGHT HIP (WITH PELVIS) 2-3 VIEWS  COMPARISON:  Prior CT abdomen/ pelvis 07/09/2014  FINDINGS: No evidence of acute fracture or dislocation. Mild degenerative change with prominence of the lateral aspect of the right acetabular roof consistent with early osteophyte formation otherwise the osseous structures are unremarkable. No lytic or blastic osseous lesion. Unremarkable visualized bowel gas pattern.  IMPRESSION: 1. No acute fracture or malalignment. 2. Early osteophyte formation at the lateral aspect of  the right acetabular roof.   Electronically Signed   By: Malachy MoanHeath  McCullough M.D.   On: 10/12/2014 01:53     EKG Interpretation None      MDM   Final diagnoses:  Fall  Abdominal wall contusion, initial encounter    Bedside ultrasound without any evidence of intra-abdominal injury. Negative FAST exam. We'll treat for abdominal wall contusion.    Loren Raceravid Charniece Venturino, MD 10/12/14 [phone]

## 2014-10-30 ENCOUNTER — Encounter (HOSPITAL_BASED_OUTPATIENT_CLINIC_OR_DEPARTMENT_OTHER): Payer: Self-pay | Admitting: *Deleted

## 2014-10-30 ENCOUNTER — Emergency Department (HOSPITAL_BASED_OUTPATIENT_CLINIC_OR_DEPARTMENT_OTHER)
Admission: EM | Admit: 2014-10-30 | Discharge: 2014-10-30 | Discharge status: Against Medical Advice | Payer: 59 | Attending: Emergency Medicine | Admitting: Emergency Medicine

## 2014-10-30 DIAGNOSIS — J45909 Unspecified asthma, uncomplicated: Secondary | ICD-10-CM | POA: Diagnosis not present

## 2014-10-30 DIAGNOSIS — R079 Chest pain, unspecified: Secondary | ICD-10-CM | POA: Diagnosis present

## 2014-10-30 DIAGNOSIS — G8929 Other chronic pain: Secondary | ICD-10-CM | POA: Insufficient documentation

## 2014-10-30 DIAGNOSIS — Z720 Tobacco use: Secondary | ICD-10-CM | POA: Insufficient documentation

## 2014-10-30 HISTORY — DX: Anemia, unspecified: D64.9

## 2014-10-30 NOTE — ED Notes (Signed)
Pt c/o chestpain "pressure "  with sob  X 3 days

## 2014-11-29 ENCOUNTER — Encounter (HOSPITAL_COMMUNITY): Payer: Self-pay

## 2014-11-29 ENCOUNTER — Emergency Department (HOSPITAL_COMMUNITY)
Admission: EM | Admit: 2014-11-29 | Discharge: 2014-11-29 | Disposition: A | Discharge status: Home / Self Care | Payer: Managed Care, Other (non HMO) | Attending: Emergency Medicine | Admitting: Emergency Medicine

## 2014-11-29 DIAGNOSIS — Z79899 Other long term (current) drug therapy: Secondary | ICD-10-CM | POA: Insufficient documentation

## 2014-11-29 DIAGNOSIS — R2242 Localized swelling, mass and lump, left lower limb: Secondary | ICD-10-CM | POA: Diagnosis present

## 2014-11-29 DIAGNOSIS — G8929 Other chronic pain: Secondary | ICD-10-CM | POA: Insufficient documentation

## 2014-11-29 DIAGNOSIS — Z862 Personal history of diseases of the blood and blood-forming organs and certain disorders involving the immune mechanism: Secondary | ICD-10-CM | POA: Insufficient documentation

## 2014-11-29 DIAGNOSIS — Z8679 Personal history of other diseases of the circulatory system: Secondary | ICD-10-CM | POA: Diagnosis not present

## 2014-11-29 DIAGNOSIS — L03116 Cellulitis of left lower limb: Secondary | ICD-10-CM | POA: Insufficient documentation

## 2014-11-29 DIAGNOSIS — J45909 Unspecified asthma, uncomplicated: Secondary | ICD-10-CM | POA: Diagnosis not present

## 2014-11-29 DIAGNOSIS — K219 Gastro-esophageal reflux disease without esophagitis: Secondary | ICD-10-CM | POA: Diagnosis not present

## 2014-11-29 DIAGNOSIS — Z720 Tobacco use: Secondary | ICD-10-CM | POA: Insufficient documentation

## 2014-11-29 DIAGNOSIS — Z7982 Long term (current) use of aspirin: Secondary | ICD-10-CM | POA: Diagnosis not present

## 2014-11-29 MED ORDER — SULFAMETHOXAZOLE-TRIMETHOPRIM 800-160 MG PO TABS: 1.0000 | ORAL_TABLET | Freq: Two times a day (BID) | ORAL | Status: AC

## 2014-11-29 NOTE — ED Notes (Addendum)
Pt presents with c/o knot on her left leg behind her knee that she noticed approx 3 weeks ago. Pt also believes she may have a sinus infection.

## 2014-11-29 NOTE — Discharge Instructions (Signed)

## 2014-11-29 NOTE — ED Provider Notes (Signed)
CSN: [id number]     Arrival date & time 11/29/14  1215 History   This chart was scribed for Jenny Pel, PA-C working with No att. providers found by Elveria Rising, ED Scribe. This patient was seen in room WTR8/WTR8 and the patient's care was started at 1:23 PM.   Chief Complaint  Patient presents with  . Knot on leg    The history is provided by the patient. No language interpreter was used.   HPI Comments: Jenny Evans is a 44 y.o. female who presents to the Emergency Department complaining of painful mass to left popliteal region which she noticed three weeks ago. Patient reports throbbing pain when walking after periods of rest and applied pressure. Patient denies known insect bites to site or direct injury. Patient reports treatment with warm soaks, suspecting she had a pulled muscle, but denies any improvement in her pain.   he patient denies diaphoresis, fever, headache, weakness (general or focal), confusion, change of vision,  neck pain, dysphagia, aphagia, chest pain, shortness of breath,  back pain, abdominal pains, nausea, vomiting, diarrhea, lower extremity swelling.   Past Medical History  Diagnosis Date  . Migraine   . GERD (gastroesophageal reflux disease)   . Ulcer   . Toothache   . Chronic pain   . Bronchitis   . Sickle cell trait   . Asthma   . Anemia    Past Surgical History  Procedure Laterality Date  . Myringotomy      age 57  . Esophagogastroduodenoscopy (egd) with propofol N/A 04/04/2013    Procedure: ESOPHAGOGASTRODUODENOSCOPY (EGD) WITH PROPOFOL;  Surgeon: Willis Modena, MD;  Location: [facility];  Service: Endoscopy;  Laterality: N/A;  . Examination under anesthesia N/A 08/10/2013    Procedure: RECTAL EXAM UNDER ANESTHESIA;  Surgeon: Clovis Pu. Cornett, MD;  Location: [facility];  Service: General;  Laterality: N/A;  ligation   Family History  Problem Relation Age of Onset  . Heart disease Mother   . Kidney disease Mother   .  Hypertension Mother   . COPD Mother   . Diabetes Mother   . Heart disease Father   . Crohn's disease Maternal Aunt    History  Substance Use Topics  . Smoking status: Current Every Day Smoker -- 0.50 packs/day for 15 years    Types: Cigarettes  . Smokeless tobacco: Never Used  . Alcohol Use: Yes     Comment: very rare   OB History    No data available     Review of Systems  Constitutional: Negative for fever and chills.    Allergies  Hydrocodone-acetaminophen  Home Medications   Prior to Admission medications   Medication Sig Start Date End Date Taking? Authorizing Provider  Aspirin-Acetaminophen-Caffeine (GOODY HEADACHE PO) Take 1 each by mouth 3 (three) times daily as needed (pain.).   Yes Historical Provider, MD  diphenhydrAMINE (SOMINEX) 25 MG tablet Take 25 mg by mouth at bedtime as needed for allergies or sleep.   Yes Historical Provider, MD  esomeprazole (NEXIUM) 40 MG capsule Take 1 capsule (40 mg total) by mouth 2 (two) times daily before a meal. 04/04/13  Yes Willis Modena, MD  SUMAtriptan (IMITREX) 6 MG/0.5ML SOLN injection Inject 6 mg into the skin every 2 (two) hours as needed for migraine or headache (migraine). May repeat in 2 hours if headache persists or recurs.   Yes Historical Provider, MD  ibuprofen (ADVIL,MOTRIN) 600 MG tablet Take 1 tablet (600 mg total) by mouth every  6 (six) hours as needed. Patient not taking: Reported on 11/29/2014 10/12/14   Loren Racer, MD  sulfamethoxazole-trimethoprim (BACTRIM DS,SEPTRA DS) 800-160 MG per tablet Take 1 tablet by mouth 2 (two) times daily. 11/29/14 12/06/14  Jenny Pel, PA-C   Triage Vitals: BP 109/56 mmHg  Pulse 72  Temp(Src) 97.8 F (36.6 C) (Oral)  Resp 18  SpO2 100%  LMP 11/25/2014 (Exact Date) Physical Exam  Constitutional: She is oriented to person, place, and time. She appears well-developed and well-nourished. No distress.  HENT:  Head: Normocephalic and atraumatic.  Eyes: EOM are normal.   Neck: Neck supple. No tracheal deviation present.  Cardiovascular: Normal rate.   Pulmonary/Chest: Effort normal. No respiratory distress.  Musculoskeletal: Normal range of motion.  Neurological: She is alert and oriented to person, place, and time.  Skin: Skin is warm and dry. She is not diaphoretic.  Cellulitis to posterior left knee, possible insect bite. No swelling to lower extremity Pedal pulses are symmetrical  Psychiatric: She has a normal mood and affect. Her behavior is normal.  Nursing note and vitals reviewed.   ED Course  Procedures (including critical care time)  COORDINATION OF CARE: 1:27 PM- Will prescribe antibiotics to cover early infection. Patient agrees to return to ED if her symptoms do not improvement over the next week. Discussed treatment plan with patient at bedside and patient agreed to plan.   Labs Review Labs Reviewed - No data to display  Imaging Review No results found.   EKG Interpretation None      MDM   Final diagnoses:  Cellulitis of left lower extremity    Patient made aware that infection may develop into an abscess and if the rash worsens or fails to resolve the area then she needs to return to the ED.  Medications - No data to display  44 y.o.Jenny Evans's evaluation in the Emergency Department is complete. It has been determined that no acute conditions requiring further emergency intervention are present at this time. The patient/guardian have been advised of the diagnosis and plan. We have discussed signs and symptoms that warrant return to the ED, such as changes or worsening in symptoms.  Vital signs are stable at discharge. Filed Vitals:   11/29/14 1341  BP: 99/74  Pulse: 99  Temp:   Resp: 20    Patient/guardian has voiced understanding and agreed to follow-up with the PCP or specialist.   I personally performed the services described in this documentation, which was scribed in my presence. The recorded  information has been reviewed and is accurate.   Jenny Pel, PA-C 11/29/14 1347  Eber Hong, MD 12/02/14 [phone]

## 2015-01-18 ENCOUNTER — Encounter (HOSPITAL_COMMUNITY): Payer: Self-pay | Admitting: *Deleted

## 2015-01-18 ENCOUNTER — Emergency Department (HOSPITAL_COMMUNITY)
Admission: EM | Admit: 2015-01-18 | Discharge: 2015-01-18 | Disposition: A | Discharge status: Home / Self Care | Payer: PRIVATE HEALTH INSURANCE | Attending: Physician Assistant | Admitting: Physician Assistant

## 2015-01-18 DIAGNOSIS — G8929 Other chronic pain: Secondary | ICD-10-CM | POA: Insufficient documentation

## 2015-01-18 DIAGNOSIS — R51 Headache: Secondary | ICD-10-CM | POA: Diagnosis present

## 2015-01-18 DIAGNOSIS — Z3202 Encounter for pregnancy test, result negative: Secondary | ICD-10-CM | POA: Insufficient documentation

## 2015-01-18 DIAGNOSIS — J0100 Acute maxillary sinusitis, unspecified: Secondary | ICD-10-CM | POA: Insufficient documentation

## 2015-01-18 DIAGNOSIS — Z720 Tobacco use: Secondary | ICD-10-CM | POA: Insufficient documentation

## 2015-01-18 DIAGNOSIS — J45909 Unspecified asthma, uncomplicated: Secondary | ICD-10-CM | POA: Diagnosis not present

## 2015-01-18 DIAGNOSIS — Z862 Personal history of diseases of the blood and blood-forming organs and certain disorders involving the immune mechanism: Secondary | ICD-10-CM | POA: Diagnosis not present

## 2015-01-18 LAB — URINALYSIS, ROUTINE W REFLEX MICROSCOPIC
Bilirubin Urine: NEGATIVE
GLUCOSE, UA: NEGATIVE mg/dL
Ketones, ur: NEGATIVE mg/dL
Leukocytes, UA: NEGATIVE
Nitrite: NEGATIVE
PROTEIN: NEGATIVE mg/dL
Specific Gravity, Urine: 1.015 (ref 1.005–1.030)
Urobilinogen, UA: 0.2 mg/dL (ref 0.0–1.0)
pH: 6 (ref 5.0–8.0)

## 2015-01-18 LAB — URINE MICROSCOPIC-ADD ON

## 2015-01-18 LAB — PREGNANCY, URINE: Preg Test, Ur: NEGATIVE

## 2015-01-18 MED ORDER — AZITHROMYCIN 250 MG PO TABS: 500.0000 mg | ORAL_TABLET | Freq: Once | ORAL | Status: DC

## 2015-01-18 MED ORDER — AZITHROMYCIN 250 MG PO TABS
500.0000 mg | ORAL_TABLET | Freq: Once | ORAL | Status: AC
  Administered 2015-01-18: 500 mg via ORAL
  Filled 2015-01-18: qty 2

## 2015-01-18 MED ORDER — AZITHROMYCIN 250 MG PO TABS: 250.0000 mg | ORAL_TABLET | Freq: Once | ORAL | Status: DC

## 2015-01-18 NOTE — ED Provider Notes (Signed)
CSN: [id number]     Arrival date & time 01/18/15  0620 History   First MD Initiated Contact with Patient 01/18/15 0654     Chief Complaint  Patient presents with  . Facial Pain     (Consider location/radiation/quality/duration/timing/severity/associated sxs/prior Treatment) HPI   Pt is a patient is a pleasant 44 year old female here with sinus congestion for the last 10 days and symptoms of urinary tract infection. Patient's had no fever. No ear pain. Eating and drinking normally. Patient denies sexual activity. Patient does have to go to work today and tomorrow and would like a note from work.  Past Medical History  Diagnosis Date  . Migraine   . GERD (gastroesophageal reflux disease)   . Ulcer   . Toothache   . Chronic pain   . Bronchitis   . Sickle cell trait   . Asthma   . Anemia    Past Surgical History  Procedure Laterality Date  . Myringotomy      age 57  . Esophagogastroduodenoscopy (egd) with propofol N/A 04/04/2013    Procedure: ESOPHAGOGASTRODUODENOSCOPY (EGD) WITH PROPOFOL;  Surgeon: Willis Modena, MD;  Location: [facility];  Service: Endoscopy;  Laterality: N/A;  . Examination under anesthesia N/A 08/10/2013    Procedure: RECTAL EXAM UNDER ANESTHESIA;  Surgeon: Clovis Pu. Cornett, MD;  Location: [facility];  Service: General;  Laterality: N/A;  ligation   Family History  Problem Relation Age of Onset  . Heart disease Mother   . Kidney disease Mother   . Hypertension Mother   . COPD Mother   . Diabetes Mother   . Heart disease Father   . Crohn's disease Maternal Aunt    History  Substance Use Topics  . Smoking status: Current Every Day Smoker -- 0.50 packs/day for 15 years    Types: Cigarettes  . Smokeless tobacco: Never Used  . Alcohol Use: Yes     Comment: very rare   OB History    No data available     Review of Systems  Constitutional: Negative for activity change.  Respiratory: Negative for shortness of breath.    Cardiovascular: Negative for chest pain.  Gastrointestinal: Negative for abdominal pain.  Genitourinary: Positive for urgency. Negative for dysuria, hematuria and vaginal pain.      Allergies  Hydrocodone-acetaminophen  Home Medications   Prior to Admission medications   Medication Sig Start Date End Date Taking? Authorizing Provider  esomeprazole (NEXIUM) 40 MG capsule Take 1 capsule (40 mg total) by mouth 2 (two) times daily before a meal. 04/04/13  Yes Willis Modena, MD  SUMAtriptan (IMITREX) 6 MG/0.5ML SOLN injection Inject 6 mg into the skin every 2 (two) hours as needed for migraine or headache (migraine). May repeat in 2 hours if headache persists or recurs.   Yes Historical Provider, MD  ibuprofen (ADVIL,MOTRIN) 600 MG tablet Take 1 tablet (600 mg total) by mouth every 6 (six) hours as needed. Patient not taking: Reported on 11/29/2014 10/12/14   Loren Racer, MD   BP 108/75 mmHg  Pulse 70  Temp(Src) 98 F (36.7 C)  Resp 16  Ht  (1.651 m)  Wt 127 lb 1 oz (57.635 kg)  BMI 21.14 kg/m2  SpO2 100%  LMP 01/11/2015 Physical Exam  Constitutional: She is oriented to person, place, and time. She appears well-developed and well-nourished.  HENT:  Head: Normocephalic and atraumatic.  Mild erythema bilateral turbinates. Mild tenderness to percussion of sinuses.  Eyes: Conjunctivae are normal. Right eye exhibits  no discharge.  Neck: Neck supple.  Cardiovascular: Normal rate, regular rhythm and normal heart sounds.   No murmur heard. Pulmonary/Chest: Effort normal and breath sounds normal. She has no wheezes. She has no rales.  Abdominal: Soft. She exhibits no distension. There is no tenderness.  No CVA tenderness  Musculoskeletal: Normal range of motion. She exhibits no edema.  Neurological: She is oriented to person, place, and time. No cranial nerve deficit.  Skin: Skin is warm and dry. No rash noted. She is not diaphoretic.  Psychiatric: She has a normal mood and  affect. Her behavior is normal.  Nursing note and vitals reviewed.   ED Course  Procedures (including critical care time) Labs Review Labs Reviewed  URINALYSIS, ROUTINE W REFLEX MICROSCOPIC (NOT AT [facility])    Imaging Review No results found.   EKG Interpretation None      MDM   Final diagnoses:  None   patient is a 44 year old female presenting today with 10 days of sinus pain. 3 days of urinary symptoms. We'll check urine today. Given the length of her symptoms we will consider giving Z-Pak for patient sinus infection. Although it likely viral infection. Awaiting urine results. We'll give work note for patient.  Kenlynn Houde Randall An, MD 01/18/15 1441

## 2015-01-18 NOTE — ED Notes (Signed)
The pt is c/o a sinus and a bladder infection for one week

## 2015-01-18 NOTE — Discharge Instructions (Signed)
You have a sinus infection. Please treat with these antibiotics please return with any other concerns. You have a normal urine today. However you have any discharge or other concerning  symptoms please return for further testing.

## 2015-02-14 ENCOUNTER — Encounter (HOSPITAL_COMMUNITY): Payer: Self-pay | Admitting: Emergency Medicine

## 2015-02-14 ENCOUNTER — Emergency Department (HOSPITAL_COMMUNITY)
Admission: EM | Admit: 2015-02-14 | Discharge: 2015-02-14 | Disposition: A | Discharge status: Home / Self Care | Payer: Managed Care, Other (non HMO) | Attending: Emergency Medicine | Admitting: Emergency Medicine

## 2015-02-14 DIAGNOSIS — Z87828 Personal history of other (healed) physical injury and trauma: Secondary | ICD-10-CM | POA: Insufficient documentation

## 2015-02-14 DIAGNOSIS — G8929 Other chronic pain: Secondary | ICD-10-CM | POA: Insufficient documentation

## 2015-02-14 DIAGNOSIS — Z862 Personal history of diseases of the blood and blood-forming organs and certain disorders involving the immune mechanism: Secondary | ICD-10-CM | POA: Insufficient documentation

## 2015-02-14 DIAGNOSIS — G43909 Migraine, unspecified, not intractable, without status migrainosus: Secondary | ICD-10-CM | POA: Diagnosis not present

## 2015-02-14 DIAGNOSIS — K219 Gastro-esophageal reflux disease without esophagitis: Secondary | ICD-10-CM | POA: Diagnosis not present

## 2015-02-14 DIAGNOSIS — Z720 Tobacco use: Secondary | ICD-10-CM | POA: Diagnosis not present

## 2015-02-14 DIAGNOSIS — R51 Headache: Secondary | ICD-10-CM | POA: Diagnosis present

## 2015-02-14 DIAGNOSIS — Z79899 Other long term (current) drug therapy: Secondary | ICD-10-CM | POA: Insufficient documentation

## 2015-02-14 DIAGNOSIS — J45909 Unspecified asthma, uncomplicated: Secondary | ICD-10-CM | POA: Insufficient documentation

## 2015-02-14 MED ORDER — PROMETHAZINE HCL 25 MG/ML IJ SOLN
25.0000 mg | Freq: Once | INTRAMUSCULAR | Status: AC
  Administered 2015-02-14: 25 mg via INTRAVENOUS
  Filled 2015-02-14: qty 1

## 2015-02-14 MED ORDER — KETOROLAC TROMETHAMINE 30 MG/ML IJ SOLN
30.0000 mg | Freq: Once | INTRAMUSCULAR | Status: AC
  Administered 2015-02-14: 30 mg via INTRAVENOUS
  Filled 2015-02-14: qty 1

## 2015-02-14 MED ORDER — DIPHENHYDRAMINE HCL 50 MG/ML IJ SOLN
25.0000 mg | Freq: Once | INTRAMUSCULAR | Status: AC
  Administered 2015-02-14: 25 mg via INTRAVENOUS
  Filled 2015-02-14: qty 1

## 2015-02-14 MED ORDER — SODIUM CHLORIDE 0.9 % IV BOLUS (SEPSIS)
500.0000 mL | Freq: Once | INTRAVENOUS | Status: AC
  Administered 2015-02-14: 500 mL via INTRAVENOUS

## 2015-02-14 NOTE — ED Notes (Signed)
Pt ambulatory to restroom with steady gait. Pt states her headache is better, not completely gone, but manageable. PO challenging pt with saltine crackers and gingerale, per MD.

## 2015-02-14 NOTE — ED Provider Notes (Signed)
CSN: [id number]     Arrival date & time 02/14/15  0901 History   First MD Initiated Contact with Patient 02/14/15 [phone]     Chief Complaint  Patient presents with  . Migraine     (Consider location/radiation/quality/duration/timing/severity/associated sxs/prior Treatment) Patient is a 44 y.o. female presenting with migraines. The history is provided by the patient.  Migraine This is a recurrent problem. Associated symptoms include headaches. Pertinent negatives include no chest pain and no shortness of breath.   patient has a throbbing frontal headache. She's had it for the last 3 or 4 days. This is a typical migraine, which she's had in the past. No relief with her "migraine medicines" at home. No fevers. No chills. She has photophobia and nausea. Seen about a month ago for sinusitis. She states that has cleared up. Denies possibility of pregnancy.  Past Medical History  Diagnosis Date  . Migraine   . GERD (gastroesophageal reflux disease)   . Ulcer   . Toothache   . Chronic pain   . Bronchitis   . Sickle cell trait   . Asthma   . Anemia    Past Surgical History  Procedure Laterality Date  . Myringotomy      age 64  . Esophagogastroduodenoscopy (egd) with propofol N/A 04/04/2013    Procedure: ESOPHAGOGASTRODUODENOSCOPY (EGD) WITH PROPOFOL;  Surgeon: Willis Modena, MD;  Location: [facility];  Service: Endoscopy;  Laterality: N/A;  . Examination under anesthesia N/A 08/10/2013    Procedure: RECTAL EXAM UNDER ANESTHESIA;  Surgeon: Clovis Pu. Cornett, MD;  Location: [facility];  Service: General;  Laterality: N/A;  ligation   Family History  Problem Relation Age of Onset  . Heart disease Mother   . Kidney disease Mother   . Hypertension Mother   . COPD Mother   . Diabetes Mother   . Heart disease Father   . Crohn's disease Maternal Aunt    Social History  Substance Use Topics  . Smoking status: Current Every Day Smoker -- 0.50 packs/day for 15 years   Types: Cigarettes  . Smokeless tobacco: Never Used  . Alcohol Use: Yes     Comment: very rare   OB History    No data available     Review of Systems  Constitutional: Positive for appetite change. Negative for fever.  HENT: Negative for congestion.   Eyes: Positive for photophobia.  Respiratory: Negative for shortness of breath.   Cardiovascular: Negative for chest pain.  Gastrointestinal: Positive for nausea. Negative for vomiting.  Genitourinary: Negative for flank pain.  Musculoskeletal: Negative for back pain.  Skin: Negative for wound.  Neurological: Positive for headaches.  Hematological: Negative for adenopathy.      Allergies  Hydrocodone-acetaminophen  Home Medications   Prior to Admission medications   Medication Sig Start Date End Date Taking? Authorizing Provider  azithromycin (ZITHROMAX Z-PAK) 250 MG tablet Take 1 tablet (250 mg total) by mouth once. 01/19/15   Courteney Lyn Mackuen, MD  esomeprazole (NEXIUM) 40 MG capsule Take 1 capsule (40 mg total) by mouth 2 (two) times daily before a meal. 04/04/13   Willis Modena, MD  ibuprofen (ADVIL,MOTRIN) 600 MG tablet Take 1 tablet (600 mg total) by mouth every 6 (six) hours as needed. Patient not taking: Reported on 11/29/2014 10/12/14   Loren Racer, MD  SUMAtriptan [facility]) 6 MG/0.5ML SOLN injection Inject 6 mg into the skin every 2 (two) hours as needed for migraine or headache (migraine). May repeat in 2 hours if  headache persists or recurs.    Historical Provider, MD   BP 121/66 mmHg  Pulse 78  Temp(Src) 98.4 F (36.9 C) (Oral)  Resp 21  SpO2 99%  LMP 02/11/2015 Physical Exam  Constitutional: She is oriented to person, place, and time. She appears well-developed and well-nourished.  HENT:  Head: Normocephalic.  Neck: Normal range of motion. Neck supple.  Cardiovascular: Normal rate, regular rhythm and normal heart sounds.   No murmur heard. Pulmonary/Chest: Effort normal and breath sounds normal. No  respiratory distress. She has no wheezes. She has no rales.  Abdominal: Soft. Bowel sounds are normal. She exhibits no distension. There is no tenderness.  Musculoskeletal: Normal range of motion.  Neurological: She is alert and oriented to person, place, and time.  Skin: Skin is warm and dry.  Psychiatric: Her speech is normal.  Nursing note and vitals reviewed.   ED Course  Procedures (including critical care time) Labs Review Labs Reviewed - No data to display  Imaging Review No results found. I have personally reviewed and evaluated these images and lab results as part of my medical decision-making.   EKG Interpretation None      MDM   Final diagnoses:  Migraine without status migrainosus, not intractable, unspecified migraine type    Patient with headache. Feels better after treatment. Will discharge home. Similar to previous migraines.    Benjiman Core, MD 02/14/15 1102

## 2015-02-14 NOTE — ED Notes (Signed)
Pt reports to ED for evaluation of migraine x3-4 days, pt states "it comes and goes." Pt reports nausea without vomiting. Experiencing light sensitivity as well. Pt family member at bedside. Pt is a/o x4.

## 2015-02-14 NOTE — Discharge Instructions (Signed)

## 2015-02-19 ENCOUNTER — Encounter (HOSPITAL_COMMUNITY): Payer: Self-pay | Admitting: *Deleted

## 2015-02-19 ENCOUNTER — Emergency Department (HOSPITAL_BASED_OUTPATIENT_CLINIC_OR_DEPARTMENT_OTHER): Payer: Managed Care, Other (non HMO)

## 2015-02-19 ENCOUNTER — Emergency Department (HOSPITAL_COMMUNITY)
Admission: EM | Admit: 2015-02-19 | Discharge: 2015-02-19 | Disposition: A | Discharge status: Home / Self Care | Payer: Managed Care, Other (non HMO) | Attending: Emergency Medicine | Admitting: Emergency Medicine

## 2015-02-19 ENCOUNTER — Emergency Department (HOSPITAL_BASED_OUTPATIENT_CLINIC_OR_DEPARTMENT_OTHER)
Admission: EM | Admit: 2015-02-19 | Discharge: 2015-02-19 | Disposition: A | Discharge status: Home / Self Care | Payer: Managed Care, Other (non HMO) | Attending: Emergency Medicine | Admitting: Emergency Medicine

## 2015-02-19 ENCOUNTER — Encounter (HOSPITAL_BASED_OUTPATIENT_CLINIC_OR_DEPARTMENT_OTHER): Payer: Self-pay

## 2015-02-19 DIAGNOSIS — Z862 Personal history of diseases of the blood and blood-forming organs and certain disorders involving the immune mechanism: Secondary | ICD-10-CM | POA: Insufficient documentation

## 2015-02-19 DIAGNOSIS — G8929 Other chronic pain: Secondary | ICD-10-CM | POA: Insufficient documentation

## 2015-02-19 DIAGNOSIS — Z872 Personal history of diseases of the skin and subcutaneous tissue: Secondary | ICD-10-CM | POA: Diagnosis not present

## 2015-02-19 DIAGNOSIS — M542 Cervicalgia: Secondary | ICD-10-CM | POA: Diagnosis not present

## 2015-02-19 DIAGNOSIS — G43909 Migraine, unspecified, not intractable, without status migrainosus: Secondary | ICD-10-CM | POA: Diagnosis not present

## 2015-02-19 DIAGNOSIS — H9202 Otalgia, left ear: Secondary | ICD-10-CM | POA: Insufficient documentation

## 2015-02-19 DIAGNOSIS — R590 Localized enlarged lymph nodes: Secondary | ICD-10-CM | POA: Diagnosis present

## 2015-02-19 DIAGNOSIS — Z8719 Personal history of other diseases of the digestive system: Secondary | ICD-10-CM | POA: Diagnosis not present

## 2015-02-19 DIAGNOSIS — Z720 Tobacco use: Secondary | ICD-10-CM | POA: Diagnosis not present

## 2015-02-19 DIAGNOSIS — K219 Gastro-esophageal reflux disease without esophagitis: Secondary | ICD-10-CM | POA: Insufficient documentation

## 2015-02-19 DIAGNOSIS — Z8709 Personal history of other diseases of the respiratory system: Secondary | ICD-10-CM | POA: Diagnosis not present

## 2015-02-19 DIAGNOSIS — J45909 Unspecified asthma, uncomplicated: Secondary | ICD-10-CM | POA: Insufficient documentation

## 2015-02-19 LAB — BASIC METABOLIC PANEL
Anion gap: 7 (ref 5–15)
CALCIUM: 8.8 mg/dL — AB (ref 8.9–10.3)
CHLORIDE: 105 mmol/L (ref 101–111)
CO2: 26 mmol/L (ref 22–32)
CREATININE: 0.48 mg/dL (ref 0.44–1.00)
GFR calc non Af Amer: >60 mL/min (ref >60)
GLUCOSE: 109 mg/dL — AB (ref 65–99)
Potassium: 3 mmol/L — ABNORMAL LOW (ref 3.5–5.1)
Sodium: 138 mmol/L (ref 135–145)

## 2015-02-19 LAB — RAPID STREP SCREEN (MED CTR MEBANE ONLY): Streptococcus, Group A Screen (Direct): NEGATIVE

## 2015-02-19 MED ORDER — IOHEXOL 300 MG/ML  SOLN: 75.0000 mL | Freq: Once | INTRAMUSCULAR | Status: DC | PRN

## 2015-02-19 MED ORDER — IBUPROFEN 800 MG PO TABS
800.0000 mg | ORAL_TABLET | Freq: Once | ORAL | Status: AC
  Administered 2015-02-19: 800 mg via ORAL
  Filled 2015-02-19: qty 1

## 2015-02-19 MED ORDER — IBUPROFEN 800 MG PO TABS: 800.0000 mg | ORAL_TABLET | Freq: Three times a day (TID) | ORAL | Status: DC

## 2015-02-19 NOTE — ED Notes (Signed)
Per CT tech Adam pt does not want IV contrast for her CT neck. Dr Manus Gunning notified. VORB to do CT without contrast received

## 2015-02-19 NOTE — Discharge Instructions (Signed)
FOLLOW UP WITH DR. BLAND FOR RECHECK IF EAR PAIN CONTINUES.

## 2015-02-19 NOTE — ED Notes (Signed)
Patient transported to CT 

## 2015-02-19 NOTE — ED Provider Notes (Signed)
CSN: [id number]     Arrival date & time 02/19/15  1303 History   First MD Initiated Contact with Patient 02/19/15 1538     Chief Complaint  Patient presents with  . Lymphadenopathy     (Consider location/radiation/quality/duration/timing/severity/associated sxs/prior Treatment) HPI Comments: Pt is a 44 yo female who presents to the ED with left neck pain, onset 2 days. Pt reports she started having constant worsening left neck pain radiating up to his left jaw and temporal region. Endorses nasal congestion, sinus drainage. Denies fever, sore throat, dysphagia, dental pain, visual changes, ear drainage, cough, SOB, CP, abdominal pain, N/V/D. Pt was seen last night in the ED, was dx with otalgia and lymphadenopathy and was d/c home with recommendations for supportive care. Pt reports she has been using ibuprofen at home without relief. She notes her pain today is worse.   Past Medical History  Diagnosis Date  . Migraine   . GERD (gastroesophageal reflux disease)   . Ulcer   . Toothache   . Chronic pain   . Bronchitis   . Sickle cell trait   . Asthma   . Anemia    Past Surgical History  Procedure Laterality Date  . Myringotomy      age 60  . Esophagogastroduodenoscopy (egd) with propofol N/A 04/04/2013    Procedure: ESOPHAGOGASTRODUODENOSCOPY (EGD) WITH PROPOFOL;  Surgeon: Willis Modena, MD;  Location: [facility];  Service: Endoscopy;  Laterality: N/A;  . Examination under anesthesia N/A 08/10/2013    Procedure: RECTAL EXAM UNDER ANESTHESIA;  Surgeon: Clovis Pu. Cornett, MD;  Location: [facility];  Service: General;  Laterality: N/A;  ligation   Family History  Problem Relation Age of Onset  . Heart disease Mother   . Kidney disease Mother   . Hypertension Mother   . COPD Mother   . Diabetes Mother   . Heart disease Father   . Crohn's disease Maternal Aunt    Social History  Substance Use Topics  . Smoking status: Current Every Day Smoker -- 0.50 packs/day  for 15 years    Types: Cigarettes  . Smokeless tobacco: Never Used  . Alcohol Use: Yes     Comment: very rare   OB History    No data available     Review of Systems  HENT: Positive for congestion, postnasal drip and rhinorrhea.        Neck pain  All other systems reviewed and are negative.     Allergies  Hydrocodone-acetaminophen  Home Medications   Prior to Admission medications   Medication Sig Start Date End Date Taking? Authorizing Provider  SUMAtriptan (IMITREX) 6 MG/0.5ML SOLN injection Inject 6 mg into the skin every 2 (two) hours as needed for migraine or headache (migraine). May repeat in 2 hours if headache persists or recurs.    Historical Provider, MD   BP 114/60 mmHg  Pulse 78  Temp(Src) 98.3 F (36.8 C) (Oral)  Resp 16  Ht 5\' 4"  (1.626 m)  Wt 130 lb (58.968 kg)  BMI 22.30 kg/m2  SpO2 100%  LMP 02/11/2015 Physical Exam  Constitutional: She is oriented to person, place, and time. She appears well-developed and well-nourished.  HENT:  Head: Normocephalic and atraumatic.  Right Ear: Tympanic membrane and external ear normal.  Left Ear: Tympanic membrane and external ear normal.  Nose: Right sinus exhibits no maxillary sinus tenderness and no frontal sinus tenderness. Left sinus exhibits no maxillary sinus tenderness and no frontal sinus tenderness.  Mouth/Throat: Uvula is  midline, oropharynx is clear and moist and mucous membranes are normal. No trismus in the jaw. Abnormal dentition. No dental abscesses. No oropharyngeal exudate or posterior oropharyngeal edema.  Eyes: Conjunctivae and EOM are normal. Right eye exhibits no discharge. Left eye exhibits no discharge. No scleral icterus.  Neck: Neck supple. No tracheal deviation present. No thyromegaly present.  Decreased ROM due to pain, left submandibular lymphadenopathy.  Cardiovascular: Normal rate, regular rhythm, normal heart sounds and intact distal pulses.   Pulmonary/Chest: Effort normal and breath  sounds normal. No stridor. She has no wheezes. She has no rales. She exhibits no tenderness.  Abdominal: Soft. Bowel sounds are normal. She exhibits no mass. There is no tenderness. There is no rebound and no guarding.  Musculoskeletal: Normal range of motion. She exhibits no edema.  Lymphadenopathy:    She has cervical adenopathy.  Neurological: She is alert and oriented to person, place, and time.  Skin: Skin is warm and dry.  Nursing note and vitals reviewed.   ED Course  Procedures (including critical care time) Labs Review Labs Reviewed - No data to display  Imaging Review No results found. I have personally reviewed and evaluated these images and lab results as part of my medical decision-making.    MDM   Final diagnoses:  None    Pt presents with left neck pain. Pt was seen in the ED yesterday and dx with otalgia and cervical lymphadenopathy and d/c home with supportive care. Exam revealed dec. ROM of neck due to pain and left submandibular lymphadenopathy. Pt given pain meds. Negative strep. No clinical signs of Ludwig's angina, will order CT neck to r/o neck infection or soft tissue swelling. Pt refuses contrast, will perform CT neck without contrast.   CT pending. Handoff to [employer], PA-C. Discussed plan to d/c pt home with ibuprofen for pain management pending CT results.    Satira Sark [place], [place] 02/19/15 1733  Glynn Octave, MD 02/19/15 [phone]

## 2015-02-19 NOTE — ED Notes (Signed)
C/o left neck lymph nodes swelling head congestion, sinus drainage, HA x 2 days-seen at [facility] ED yesterday for same

## 2015-02-19 NOTE — Discharge Instructions (Signed)
There is thickening of the right vocal cord on the CT scan.  It is recommended that you follow up with the ENT doctor listed above to have this further evaluated.  Take Ibuprofen for the pain.  Your strep test today was negative.  Return to the Emergency Department if symptoms change or worsen.

## 2015-02-19 NOTE — ED Notes (Signed)
Called to bedside by pt who requested to be discharged. Pt found lying on stretcher in darkened room with eyes closed. Explained that the doctor had ordered a CT scan to evaluate her neck. States she wanted to go home because she was still in pain because we "only gave her ibuprofen". Encouraged pt to stay for ordered tests. Pt states she will wait "a little longer"

## 2015-02-19 NOTE — ED Notes (Signed)
Pt c/o headache and congestion x 2 days; pt states that today she began to have left earache; pt denies drainage from ear; pt reports clear nasal congestion

## 2015-02-19 NOTE — ED Provider Notes (Signed)
CSN: [id number]     Arrival date & time 02/19/15  0038 History   First MD Initiated Contact with Patient 02/19/15 [phone]     Chief Complaint  Patient presents with  . Otalgia     (Consider location/radiation/quality/duration/timing/severity/associated sxs/prior Treatment) Patient is a 44 y.o. female presenting with ear pain. The history is provided by the patient. No language interpreter was used.  Otalgia Location:  Left Severity:  Moderate Onset quality:  Gradual Duration:  2 days Timing:  Constant Associated symptoms: congestion   Associated symptoms: no cough, no fever, no sore throat and no vomiting   Associated symptoms comment:  Pain without fever or drainage from the the left ear for 2 days. She has pain that extends into the faw area. No sore throat or dental pain. She denies cough. She has some congestion. No vomiting or diarrhea.    Past Medical History  Diagnosis Date  . Migraine   . GERD (gastroesophageal reflux disease)   . Ulcer   . Toothache   . Chronic pain   . Bronchitis   . Sickle cell trait   . Asthma   . Anemia    Past Surgical History  Procedure Laterality Date  . Myringotomy      age 36  . Esophagogastroduodenoscopy (egd) with propofol N/A 04/04/2013    Procedure: ESOPHAGOGASTRODUODENOSCOPY (EGD) WITH PROPOFOL;  Surgeon: Willis Modena, MD;  Location: [facility];  Service: Endoscopy;  Laterality: N/A;  . Examination under anesthesia N/A 08/10/2013    Procedure: RECTAL EXAM UNDER ANESTHESIA;  Surgeon: Clovis Pu. Cornett, MD;  Location: [facility];  Service: General;  Laterality: N/A;  ligation   Family History  Problem Relation Age of Onset  . Heart disease Mother   . Kidney disease Mother   . Hypertension Mother   . COPD Mother   . Diabetes Mother   . Heart disease Father   . Crohn's disease Maternal Aunt    Social History  Substance Use Topics  . Smoking status: Current Every Day Smoker -- 0.50 packs/day for 15 years   Types: Cigarettes  . Smokeless tobacco: Never Used  . Alcohol Use: Yes     Comment: very rare   OB History    No data available     Review of Systems  Constitutional: Negative for fever and chills.  HENT: Positive for congestion and ear pain. Negative for sore throat.   Eyes: Negative for pain.  Respiratory: Negative.  Negative for cough.   Cardiovascular: Negative.   Gastrointestinal: Negative.  Negative for vomiting.  Musculoskeletal: Negative.  Negative for myalgias.  Skin: Negative.   Neurological: Negative.       Allergies  Hydrocodone-acetaminophen  Home Medications   Prior to Admission medications   Medication Sig Start Date End Date Taking? Authorizing Provider  SUMAtriptan (IMITREX) 6 MG/0.5ML SOLN injection Inject 6 mg into the skin every 2 (two) hours as needed for migraine or headache (migraine). May repeat in 2 hours if headache persists or recurs.   Yes Historical Provider, MD  azithromycin (ZITHROMAX Z-PAK) 250 MG tablet Take 1 tablet (250 mg total) by mouth once. Patient not taking: Reported on 02/19/2015 01/19/15   Courteney Lyn Mackuen, MD  esomeprazole (NEXIUM) 40 MG capsule Take 1 capsule (40 mg total) by mouth 2 (two) times daily before a meal. Patient not taking: Reported on 02/19/2015 04/04/13   Willis Modena, MD  ibuprofen (ADVIL,MOTRIN) 600 MG tablet Take 1 tablet (600 mg total) by mouth every 6 (  six) hours as needed. Patient not taking: Reported on 11/29/2014 10/12/14   Loren Racer, MD   BP 123/80 mmHg  Pulse 79  Temp(Src) 97.9 F (36.6 C) (Oral)  Resp 20  SpO2 100%  LMP 02/11/2015 Physical Exam  Constitutional: She is oriented to person, place, and time. She appears well-developed and well-nourished.  HENT:  Head: Normocephalic.  Mouth/Throat: Oropharynx is clear and moist.  Left ear TM clear, no redness. External canal is not swollen and is without drainage.   Neck: Normal range of motion. Neck supple.  Cardiovascular: Normal rate and  regular rhythm.   Pulmonary/Chest: Effort normal.  Abdominal: Soft. Bowel sounds are normal. There is no tenderness. There is no rebound and no guarding.  Musculoskeletal: Normal range of motion.  Lymphadenopathy:    She has cervical adenopathy.  Neurological: She is alert and oriented to person, place, and time.  Skin: Skin is warm and dry. No rash noted.  Psychiatric: She has a normal mood and affect.    ED Course  Procedures (including critical care time) Labs Review Labs Reviewed - No data to display  Imaging Review No results found. I have personally reviewed and evaluated these images and lab results as part of my medical decision-making.   EKG Interpretation None      MDM   Final diagnoses:  None    1. Otalgia 2. Lymphadenopathy  VSS. No evidence of bacterial infection. Recommend supportive care: ibuprofen, fluids, PCP follow up.    Elpidio Anis, PA-C 02/19/15 0130  Zadie Rhine, MD 02/19/15 [phone]

## 2015-02-20 ENCOUNTER — Encounter (HOSPITAL_COMMUNITY): Payer: Self-pay | Admitting: Emergency Medicine

## 2015-02-20 DIAGNOSIS — R51 Headache: Secondary | ICD-10-CM | POA: Diagnosis present

## 2015-02-20 DIAGNOSIS — Z872 Personal history of diseases of the skin and subcutaneous tissue: Secondary | ICD-10-CM | POA: Diagnosis not present

## 2015-02-20 DIAGNOSIS — Z862 Personal history of diseases of the blood and blood-forming organs and certain disorders involving the immune mechanism: Secondary | ICD-10-CM | POA: Insufficient documentation

## 2015-02-20 DIAGNOSIS — G43909 Migraine, unspecified, not intractable, without status migrainosus: Secondary | ICD-10-CM | POA: Diagnosis not present

## 2015-02-20 DIAGNOSIS — G8929 Other chronic pain: Secondary | ICD-10-CM | POA: Insufficient documentation

## 2015-02-20 DIAGNOSIS — Z720 Tobacco use: Secondary | ICD-10-CM | POA: Insufficient documentation

## 2015-02-20 DIAGNOSIS — J45909 Unspecified asthma, uncomplicated: Secondary | ICD-10-CM | POA: Diagnosis not present

## 2015-02-20 DIAGNOSIS — Z8719 Personal history of other diseases of the digestive system: Secondary | ICD-10-CM | POA: Diagnosis not present

## 2015-02-20 MED ORDER — ONDANSETRON 4 MG PO TBDP
8.0000 mg | ORAL_TABLET | Freq: Once | ORAL | Status: AC
  Administered 2015-02-20: 8 mg via ORAL

## 2015-02-20 MED ORDER — OXYCODONE-ACETAMINOPHEN 5-325 MG PO TABS
1.0000 | ORAL_TABLET | Freq: Once | ORAL | Status: AC
  Administered 2015-02-20: 1 via ORAL

## 2015-02-20 MED ORDER — OXYCODONE-ACETAMINOPHEN 5-325 MG PO TABS
ORAL_TABLET | ORAL | Status: DC
Start: 2015-02-20 — End: 2015-02-21
  Filled 2015-02-20: qty 1

## 2015-02-20 MED ORDER — ONDANSETRON 4 MG PO TBDP
ORAL_TABLET | ORAL | Status: DC
Start: 2015-02-20 — End: 2015-02-21
  Filled 2015-02-20: qty 2

## 2015-02-20 NOTE — ED Notes (Signed)
Pt. reports migraine headache with nausea and photophobia onset this evening .  

## 2015-02-21 ENCOUNTER — Emergency Department (HOSPITAL_COMMUNITY)
Admission: EM | Admit: 2015-02-21 | Discharge: 2015-02-21 | Disposition: A | Discharge status: Home / Self Care | Payer: Managed Care, Other (non HMO) | Attending: Emergency Medicine | Admitting: Emergency Medicine

## 2015-02-21 DIAGNOSIS — R519 Headache, unspecified: Secondary | ICD-10-CM

## 2015-02-21 DIAGNOSIS — R51 Headache: Secondary | ICD-10-CM

## 2015-02-21 MED ORDER — DIPHENHYDRAMINE HCL 25 MG PO CAPS
50.0000 mg | ORAL_CAPSULE | Freq: Once | ORAL | Status: AC
  Administered 2015-02-21: 50 mg via ORAL
  Filled 2015-02-21: qty 2

## 2015-02-21 MED ORDER — METOCLOPRAMIDE HCL 10 MG PO TABS
10.0000 mg | ORAL_TABLET | Freq: Once | ORAL | Status: AC
  Administered 2015-02-21: 10 mg via ORAL
  Filled 2015-02-21: qty 1

## 2015-02-21 MED ORDER — KETOROLAC TROMETHAMINE 60 MG/2ML IM SOLN
60.0000 mg | Freq: Once | INTRAMUSCULAR | Status: AC
  Administered 2015-02-21: 60 mg via INTRAMUSCULAR
  Filled 2015-02-21: qty 2

## 2015-02-21 MED ORDER — SUMATRIPTAN SUCCINATE 6 MG/0.5ML ~~LOC~~ SOLN
6.0000 mg | Freq: Once | SUBCUTANEOUS | Status: AC
  Administered 2015-02-21: 6 mg via SUBCUTANEOUS
  Filled 2015-02-21: qty 0.5

## 2015-02-21 NOTE — ED Provider Notes (Signed)
CSN: [id number]     Arrival date & time 02/20/15  2250 History  This chart was scribed for Tomasita Crumble, MD by Tanda Rockers, ED Scribe. This patient was seen in room A07C/A07C and the patient's care was started at 1:55 AM.  Chief Complaint  Patient presents with  . Migraine   The history is provided by the patient. No language interpreter was used.     HPI Comments: Jenny Evans is a 44 y.o. female with hx migraines who presents to the Emergency Department complaining of migraine headache that began this evening around 10:30 PM (3.5 hours ago). Pt states he had a similar migraine headache 2 days ago and he took an Imitrex with relief. He ran out of his Imitrex today and could not take it for his headache. Pt took a BC Powder earlier today without relief. He also complains of rhinorrhea and nausea. Denies sneezing, cough, weakness or numbness in extremities, or any other associated symptoms.    Past Medical History  Diagnosis Date  . Migraine   . GERD (gastroesophageal reflux disease)   . Ulcer   . Toothache   . Chronic pain   . Bronchitis   . Sickle cell trait   . Asthma   . Anemia    Past Surgical History  Procedure Laterality Date  . Myringotomy      age 89  . Esophagogastroduodenoscopy (egd) with propofol N/A 04/04/2013    Procedure: ESOPHAGOGASTRODUODENOSCOPY (EGD) WITH PROPOFOL;  Surgeon: Willis Modena, MD;  Location: [facility];  Service: Endoscopy;  Laterality: N/A;  . Examination under anesthesia N/A 08/10/2013    Procedure: RECTAL EXAM UNDER ANESTHESIA;  Surgeon: Clovis Pu. Cornett, MD;  Location: [facility];  Service: General;  Laterality: N/A;  ligation   Family History  Problem Relation Age of Onset  . Heart disease Mother   . Kidney disease Mother   . Hypertension Mother   . COPD Mother   . Diabetes Mother   . Heart disease Father   . Crohn's disease Maternal Aunt    Social History  Substance Use Topics  . Smoking status: Current Every  Day Smoker -- 0.50 packs/day for 15 years    Types: Cigarettes  . Smokeless tobacco: Never Used  . Alcohol Use: Yes     Comment: very rare   OB History    No data available     Review of Systems  10 Systems reviewed and all are negative for acute change except as noted in the HPI.  Allergies  Hydrocodone-acetaminophen  Home Medications   Prior to Admission medications   Medication Sig Start Date End Date Taking? Authorizing Provider  SUMAtriptan (IMITREX) 6 MG/0.5ML SOLN injection Inject 6 mg into the skin every 2 (two) hours as needed for migraine or headache (migraine). May repeat in 2 hours if headache persists or recurs.    Historical Provider, MD   Triage Vitals: BP 115/76 mmHg  Pulse 75  Temp(Src) 97.6 F (36.4 C) (Oral)  Resp 16  Ht  (1.626 m)  Wt 127 lb (57.607 kg)  BMI 21.79 kg/m2  SpO2 99%  LMP 02/11/2015   Physical Exam  Constitutional: She is oriented to person, place, and time. She appears well-developed and well-nourished. No distress.  HENT:  Head: Normocephalic and atraumatic.  Nose: Nose normal.  Mouth/Throat: Oropharynx is clear and moist. No oropharyngeal exudate.  Eyes: Conjunctivae and EOM are normal. Pupils are equal, round, and reactive to light. No scleral icterus.  Neck: Normal range of motion. Neck supple. No JVD present. No tracheal deviation present. No thyromegaly present.  Cardiovascular: Normal rate, regular rhythm and normal heart sounds.  Exam reveals no gallop and no friction rub.   No murmur heard. Pulmonary/Chest: Effort normal and breath sounds normal. No respiratory distress. She has no wheezes. She exhibits no tenderness.  Abdominal: Soft. Bowel sounds are normal. She exhibits no distension and no mass. There is no tenderness. There is no rebound and no guarding.  Musculoskeletal: Normal range of motion. She exhibits no edema or tenderness.  Lymphadenopathy:    She has no cervical adenopathy.  Neurological: She is alert and  oriented to person, place, and time. No cranial nerve deficit. She exhibits normal muscle tone.  Normal strength and sensation in all extremities  Skin: Skin is warm and dry. No rash noted. No erythema. No pallor.  Nursing note and vitals reviewed.   ED Course  Procedures (including critical care time)  DIAGNOSTIC STUDIES: Oxygen Saturation is 99% on RA, normal by my interpretation.    COORDINATION OF CARE: 1:59 AM-Discussed treatment plan which includes Percocet and Zofran with pt at bedside and pt agreed to plan.   Labs Review Labs Reviewed - No data to display  Imaging Review Ct Soft Tissue Neck Wo Contrast  02/19/2015   CLINICAL DATA:  Left neck pain  EXAM: CT NECK WITHOUT CONTRAST  TECHNIQUE: Multidetector CT imaging of the neck was performed following the standard protocol without intravenous contrast.  COMPARISON:  CT neck 08/18/2012  FINDINGS: The patient refused intravenous contrast.  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 There also is mild asymmetry of the right cord on the prior study. Recommend direct visualization rule out mass lesion or polyp. Epiglottis is normal. The pharynx is normal. Tonsil normal. Tongue normal.  Parotid and submandibular glands normal.  Thyroid not enlarged  Negative for adenopathy in the neck.  No skin edema or soft tissue swelling identified.  Skeletal structures are normal.  Normal cervical spine.  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 Recommend ENT referral for direct visualization to rule out mass or polyp. This area also is asymmetric on the prior CT.  Otherwise no mass or adenopathy in the neck. No evidence of cellulitis or inflammation.   Electronically Signed   By: Marlan Palau M.D.   On: 02/19/2015 18:03   I have personally reviewed and evaluated these images and lab results as part of my medical decision-making.   EKG Interpretation None      MDM   Final diagnoses:  None   Patient presents to the ED  for migraine, she states is consistent with her normal headaches.  She ran out of her imitrex which normally helps her.  She was given a dose here as well as toradol and reglan for pain control.  Will observe for improvement.  Neurological exam is normal tonight.  Upon repeat evaluation, patient states her headache has significantly improved.  She is resting comfortable in bed. Patient will be safe for discharge. Neurological exam remains normal. Her vital signs remain within her normal limits. Patient overall appears well in no acute distress.  I personally performed the services described in this documentation, which was scribed in my presence. The recorded information has been reviewed and is accurate.    Tomasita Crumble, MD 02/21/15 [phone]

## 2015-02-21 NOTE — Discharge Instructions (Signed)
Recurrent Migraine Headache Ms. Mackley, see her primary care physician within 3 days for close follow-up. If symptoms worsen come back to emergency department immediately. He can take Tylenol or ibuprofen as needed for headaches. Thank you. A migraine headache is very bad, throbbing pain on one or both sides of your head. Recurrent migraines keep coming back. Talk to your doctor about what things may bring on (trigger) your migraine headaches. HOME CARE  Only take medicines as told by your doctor.  Lie down in a dark, quiet room when you have a migraine.  Keep a journal to find out if certain things bring on migraine headaches. For example, write down:  What you eat and drink.  How much sleep you get.  Any change to your diet or medicines.  Lessen how much alcohol you drink.  Quit smoking if you smoke.  Get enough sleep.  Lessen any stress in your life.  Keep lights dim if bright lights bother you or make your migraines worse. GET HELP IF:  Medicine does not help your migraines.  Your pain keeps coming back.  You have a fever. GET HELP RIGHT AWAY IF:   Your migraine becomes really bad.  You have a stiff neck.  You have trouble seeing.  Your muscles are weak, or you lose muscle control.  You lose your balance or have trouble walking.  You feel like you will pass out (faint), or you pass out.  You have really bad symptoms that are different than your first symptoms. MAKE SURE YOU:   Understand these instructions.  Will watch your condition.  Will get help right away if you are not doing well or get worse. Document Released: 03/11/2008 Document Revised: 06/07/2013 Document Reviewed: 02/07/2013 [facility] Patient Information 2015 [place], [place]. This information is not intended to replace advice given to you by your health care provider. Make sure you discuss any questions you have with your health care provider.

## 2015-02-21 NOTE — ED Notes (Signed)
MD to see pt before RN assessment. 

## 2015-02-22 LAB — CULTURE, GROUP A STREP: Strep A Culture: NEGATIVE

## 2015-02-23 ENCOUNTER — Emergency Department (HOSPITAL_COMMUNITY)
Admission: EM | Admit: 2015-02-23 | Discharge: 2015-02-23 | Disposition: A | Discharge status: Home / Self Care | Payer: 59 | Attending: Emergency Medicine | Admitting: Emergency Medicine

## 2015-02-23 ENCOUNTER — Encounter (HOSPITAL_COMMUNITY): Payer: Self-pay

## 2015-02-23 DIAGNOSIS — L040 Acute lymphadenitis of face, head and neck: Secondary | ICD-10-CM | POA: Diagnosis not present

## 2015-02-23 DIAGNOSIS — G8929 Other chronic pain: Secondary | ICD-10-CM | POA: Insufficient documentation

## 2015-02-23 DIAGNOSIS — Z8719 Personal history of other diseases of the digestive system: Secondary | ICD-10-CM | POA: Diagnosis not present

## 2015-02-23 DIAGNOSIS — Z862 Personal history of diseases of the blood and blood-forming organs and certain disorders involving the immune mechanism: Secondary | ICD-10-CM | POA: Diagnosis not present

## 2015-02-23 DIAGNOSIS — J45909 Unspecified asthma, uncomplicated: Secondary | ICD-10-CM | POA: Diagnosis not present

## 2015-02-23 DIAGNOSIS — J029 Acute pharyngitis, unspecified: Secondary | ICD-10-CM | POA: Insufficient documentation

## 2015-02-23 DIAGNOSIS — G43909 Migraine, unspecified, not intractable, without status migrainosus: Secondary | ICD-10-CM | POA: Insufficient documentation

## 2015-02-23 DIAGNOSIS — I889 Nonspecific lymphadenitis, unspecified: Secondary | ICD-10-CM

## 2015-02-23 DIAGNOSIS — Z720 Tobacco use: Secondary | ICD-10-CM | POA: Diagnosis not present

## 2015-02-23 MED ORDER — HYDROCODONE-ACETAMINOPHEN 5-325 MG PO TABS: 1.0000 | ORAL_TABLET | ORAL | Status: DC | PRN

## 2015-02-23 MED ORDER — HYDROCODONE-ACETAMINOPHEN 5-325 MG PO TABS
2.0000 | ORAL_TABLET | Freq: Once | ORAL | Status: DC
  Filled 2015-02-23: qty 2

## 2015-02-23 MED ORDER — ONDANSETRON 4 MG PO TBDP: 4.0000 mg | ORAL_TABLET | Freq: Three times a day (TID) | ORAL | Status: DC | PRN

## 2015-02-23 MED ORDER — AMOXICILLIN-POT CLAVULANATE 875-125 MG PO TABS
1.0000 | ORAL_TABLET | Freq: Once | ORAL | Status: AC
  Administered 2015-02-23: 1 via ORAL
  Filled 2015-02-23: qty 1

## 2015-02-23 MED ORDER — ONDANSETRON 4 MG PO TBDP
4.0000 mg | ORAL_TABLET | Freq: Once | ORAL | Status: AC
  Administered 2015-02-23: 4 mg via ORAL
  Filled 2015-02-23: qty 1

## 2015-02-23 MED ORDER — AMOXICILLIN-POT CLAVULANATE 875-125 MG PO TABS: 1.0000 | ORAL_TABLET | Freq: Two times a day (BID) | ORAL | Status: DC

## 2015-02-23 MED ORDER — OXYCODONE-ACETAMINOPHEN 5-325 MG PO TABS
2.0000 | ORAL_TABLET | Freq: Once | ORAL | Status: AC
  Administered 2015-02-23: 2 via ORAL
  Filled 2015-02-23: qty 2

## 2015-02-23 MED ORDER — OXYCODONE-ACETAMINOPHEN 5-325 MG PO TABS: 2.0000 | ORAL_TABLET | ORAL | Status: DC | PRN

## 2015-02-23 NOTE — ED Notes (Signed)
Pt presents with a complaint of swollen lymph node on the left side of her neck. Pt states that the left side of her neck hurts where the lymph node is swollen and the pain radiates to her left ear. Pt denies fever and has not been around anyone sick recently.

## 2015-02-23 NOTE — Discharge Instructions (Signed)
Take augmentin as directed until gone. Take vicodin for pain. Take zofran for nausea. Refer to attached documents for more information.

## 2015-02-23 NOTE — ED Notes (Signed)
Pt verbalized understanding of d/c instructions and has no further questions. Pt stable and NAD. Pt to follow up with ENT.

## 2015-02-23 NOTE — ED Provider Notes (Signed)
CSN: [id number]     Arrival date & time 02/23/15  0501 History   First MD Initiated Contact with Patient 02/23/15 0530     Chief Complaint  Patient presents with  . Lymphadenopathy     (Consider location/radiation/quality/duration/timing/severity/associated sxs/prior Treatment) HPI Comments: Patient is a 44 year old female who presents with left side neck pain that started 1 week ago. Symptoms started gradually and progressively worsened since the onset. The pain is aching and severe with radiation to her throat and ear. Patient tried ibuprofen at home without improvement. Patient seen in the ED 4 days ago for the same thing and is scheduled to follow up with ENT next week. No aggravating/alleviating factors.    Past Medical History  Diagnosis Date  . Migraine   . GERD (gastroesophageal reflux disease)   . Ulcer   . Toothache   . Chronic pain   . Bronchitis   . Sickle cell trait   . Asthma   . Anemia    Past Surgical History  Procedure Laterality Date  . Myringotomy      age 17  . Esophagogastroduodenoscopy (egd) with propofol N/A 04/04/2013    Procedure: ESOPHAGOGASTRODUODENOSCOPY (EGD) WITH PROPOFOL;  Surgeon: Willis Modena, MD;  Location: [facility];  Service: Endoscopy;  Laterality: N/A;  . Examination under anesthesia N/A 08/10/2013    Procedure: RECTAL EXAM UNDER ANESTHESIA;  Surgeon: Clovis Pu. Cornett, MD;  Location: [facility];  Service: General;  Laterality: N/A;  ligation   Family History  Problem Relation Age of Onset  . Heart disease Mother   . Kidney disease Mother   . Hypertension Mother   . COPD Mother   . Diabetes Mother   . Heart disease Father   . Crohn's disease Maternal Aunt    Social History  Substance Use Topics  . Smoking status: Current Every Day Smoker -- 0.50 packs/day for 15 years    Types: Cigarettes  . Smokeless tobacco: Never Used  . Alcohol Use: Yes     Comment: very rare   OB History    No data available      Review of Systems  HENT: Positive for sore throat.   Musculoskeletal: Positive for neck pain.  All other systems reviewed and are negative.     Allergies  Hydrocodone-acetaminophen  Home Medications   Prior to Admission medications   Medication Sig Start Date End Date Taking? Authorizing Provider  SUMAtriptan (IMITREX) 6 MG/0.5ML SOLN injection Inject 6 mg into the skin every 2 (two) hours as needed for migraine or headache (migraine). May repeat in 2 hours if headache persists or recurs.   Yes Historical Provider, MD   BP 133/89 mmHg  Pulse 69  Temp(Src) 98.1 F (36.7 C) (Oral)  Resp 17  Ht  (1.626 m)  Wt 130 lb (58.968 kg)  BMI 22.30 kg/m2  SpO2 99%  LMP 02/11/2015 Physical Exam  Constitutional: She is oriented to person, place, and time. She appears well-developed and well-nourished. No distress.  HENT:  Head: Normocephalic and atraumatic.  Eyes: Conjunctivae are normal.  Neck: Normal range of motion.  Tender, swollen left cervical lymph node that is movable.   Cardiovascular: Normal rate and regular rhythm.  Exam reveals no gallop and no friction rub.   No murmur heard. Pulmonary/Chest: Effort normal and breath sounds normal. She has no wheezes. She has no rales. She exhibits no tenderness.  Abdominal: Soft. She exhibits no distension. There is no tenderness. There is no rebound.  Musculoskeletal: Normal range of motion.  Lymphadenopathy:    She has cervical adenopathy.  Neurological: She is alert and oriented to person, place, and time. Coordination normal.  Speech is goal-oriented. Moves limbs without ataxia.   Skin: Skin is warm and dry.  Psychiatric: She has a normal mood and affect. Her behavior is normal.  Nursing note and vitals reviewed.   ED Course  Procedures (including critical care time) Labs Review Labs Reviewed - No data to display  Imaging Review No results found. I have personally reviewed and evaluated these images and lab results  as part of my medical decision-making.   EKG Interpretation None      MDM   Final diagnoses:  Lymphadenitis    5:38 AM Patient will have augmentin for lymphadenitis and vicodin for pain. Patient has ENT follow up next week. Vitals stable and patient afebrile.   Emilia Beck, PA-C 02/23/15 1610  Tomasita Crumble, MD 02/23/15 [phone]

## 2015-03-05 ENCOUNTER — Emergency Department (HOSPITAL_COMMUNITY)
Admission: EM | Admit: 2015-03-05 | Discharge: 2015-03-05 | Disposition: A | Discharge status: Home / Self Care | Payer: Managed Care, Other (non HMO) | Attending: Emergency Medicine | Admitting: Emergency Medicine

## 2015-03-05 ENCOUNTER — Encounter (HOSPITAL_COMMUNITY): Payer: Self-pay | Admitting: Emergency Medicine

## 2015-03-05 DIAGNOSIS — G8929 Other chronic pain: Secondary | ICD-10-CM | POA: Insufficient documentation

## 2015-03-05 DIAGNOSIS — J45909 Unspecified asthma, uncomplicated: Secondary | ICD-10-CM | POA: Diagnosis not present

## 2015-03-05 DIAGNOSIS — M542 Cervicalgia: Secondary | ICD-10-CM | POA: Diagnosis not present

## 2015-03-05 DIAGNOSIS — Z862 Personal history of diseases of the blood and blood-forming organs and certain disorders involving the immune mechanism: Secondary | ICD-10-CM | POA: Insufficient documentation

## 2015-03-05 DIAGNOSIS — Z720 Tobacco use: Secondary | ICD-10-CM | POA: Diagnosis not present

## 2015-03-05 DIAGNOSIS — Z8719 Personal history of other diseases of the digestive system: Secondary | ICD-10-CM | POA: Diagnosis not present

## 2015-03-05 DIAGNOSIS — G43909 Migraine, unspecified, not intractable, without status migrainosus: Secondary | ICD-10-CM | POA: Diagnosis not present

## 2015-03-05 MED ORDER — ACETAMINOPHEN 500 MG PO TABS
1000.0000 mg | ORAL_TABLET | Freq: Once | ORAL | Status: AC
  Administered 2015-03-05: 1000 mg via ORAL
  Filled 2015-03-05: qty 2

## 2015-03-05 NOTE — Discharge Instructions (Signed)
Pain of Unknown Etiology (Pain Without a Known Cause) °You have come to your caregiver because of pain. Pain can occur in any part of the body. Often there is not a definite cause. If your laboratory (blood or urine) work was normal and X-rays or other studies were normal, your caregiver may treat you without knowing the cause of the pain. An example of this is the headache. Most headaches are diagnosed by taking a history. This means your caregiver asks you questions about your headaches. Your caregiver determines a treatment based on your answers. Usually testing done for headaches is normal. Often testing is not done unless there is no response to medications. Regardless of where your pain is located today, you can be given medications to make you comfortable. If no physical cause of pain can be found, most cases of pain will gradually leave as suddenly as they came.  °If you have a painful condition and no reason can be found for the pain, it is important that you follow up with your caregiver. If the pain becomes worse or does not go away, it may be necessary to repeat tests and look further for a possible cause. °· Only take over-the-counter or prescription medicines for pain, discomfort, or fever as directed by your caregiver. °· For the protection of your privacy, test results cannot be given over the phone. Make sure you receive the results of your test. Ask how these results are to be obtained if you have not been informed. It is your responsibility to obtain your test results. °· You may continue all activities unless the activities cause more pain. When the pain lessens, it is important to gradually resume normal activities. Resume activities by beginning slowly and gradually increasing the intensity and duration of the activities or exercise. During periods of severe pain, bed rest may be helpful. Lie or sit in any position that is comfortable. °· Ice used for acute (sudden) conditions may be effective.  Use a large plastic bag filled with ice and wrapped in a towel. This may provide pain relief. °· See your caregiver for continued problems. Your caregiver can help or refer you for exercises or physical therapy if necessary. °If you were given medications for your condition, do not drive, operate machinery or power tools, or sign legal documents for 24 hours. Do not drink alcohol, take sleeping pills, or take other medications that may interfere with treatment. °See your caregiver immediately if you have pain that is becoming worse and not relieved by medications. °Document Released: 02/25/2001 Document Revised: 03/23/2013 Document Reviewed: 06/02/2005 °[employer]® Patient Information ©2015 [employer]. This information is not intended to replace advice given to you by your health care provider. Make sure you discuss any questions you have with your health care provider. ° °

## 2015-03-05 NOTE — ED Notes (Signed)
Called Dr. Patria Mane, patient requests pain medication to take at home, also reviewed bp manual of 92/52. MD acknowledges, allowed to discharge. No orders for prescription. Patient advised to take motrin and tylenol for pain management per Dr. Patria Mane. Patient left without discharge paperwork, agitated. Digitally signed and left once told no prescriptions to be given

## 2015-03-05 NOTE — ED Notes (Signed)
Patient here with complaint of left lateral neck pain with radiation to his head. Has been evaluated for the same before and was referred to an ENT. Was told by ENT that the pain may be related to his carotid artery on that side. Presents tonight because pain is much worse and is shooting into his head.

## 2015-03-05 NOTE — ED Provider Notes (Signed)
CSN: [id number]     Arrival date & time 03/05/15  0418 History   First MD Initiated Contact with Patient 03/05/15 [phone]     Chief Complaint  Patient presents with  . Neck Pain     HPI Patient presents the emergency department complaining of ongoing left-sided neck pain over the past 3 weeks.  She underwent CT imaging of her neck 2 weeks ago which demonstrated no significant pathology except for questionable swelling of the vocal cord.  She reports she is seen in her nose and throat surgery in follow-up and they did a nasopharyngoscopy and she was told that everything with her neck and vocal cords is without significant abnormality.  Recommend that she develop a relationship with a primary care physician.  She presents now because of ongoing discomfort and pain.  No fevers or chills.  No swelling noted of her left side of her neck for the patient.  She reports her difficulty swallowing or eating.  No change in her voice.  Her pain is focused in her left neck and is ongoing.  She was treated with antibiotics for possible lymphadenitis   Past Medical History  Diagnosis Date  . Migraine   . GERD (gastroesophageal reflux disease)   . Ulcer   . Toothache   . Chronic pain   . Bronchitis   . Sickle cell trait   . Asthma   . Anemia    Past Surgical History  Procedure Laterality Date  . Myringotomy      age 58  . Esophagogastroduodenoscopy (egd) with propofol N/A 04/04/2013    Procedure: ESOPHAGOGASTRODUODENOSCOPY (EGD) WITH PROPOFOL;  Surgeon: Willis Modena, MD;  Location: [facility];  Service: Endoscopy;  Laterality: N/A;  . Examination under anesthesia N/A 08/10/2013    Procedure: RECTAL EXAM UNDER ANESTHESIA;  Surgeon: Clovis Pu. Cornett, MD;  Location: [facility];  Service: General;  Laterality: N/A;  ligation   Family History  Problem Relation Age of Onset  . Heart disease Mother   . Kidney disease Mother   . Hypertension Mother   . COPD Mother   . Diabetes Mother    . Heart disease Father   . Crohn's disease Maternal Aunt    Social History  Substance Use Topics  . Smoking status: Current Every Day Smoker -- 0.50 packs/day for 15 years    Types: Cigarettes  . Smokeless tobacco: Never Used  . Alcohol Use: Yes     Comment: very rare   OB History    No data available     Review of Systems  All other systems reviewed and are negative.     Allergies  Hydrocodone-acetaminophen  Home Medications   Prior to Admission medications   Medication Sig Start Date End Date Taking? Authorizing Provider  ondansetron (ZOFRAN ODT) 4 MG disintegrating tablet Take 1 tablet (4 mg total) by mouth every 8 (eight) hours as needed for nausea or vomiting. 02/23/15   Emilia Beck, PA-C  SUMAtriptan (IMITREX) 6 MG/0.5ML SOLN injection Inject 6 mg into the skin every 2 (two) hours as needed for migraine or headache (migraine). May repeat in 2 hours if headache persists or recurs.    Historical Provider, MD   BP 117/75 mmHg  Pulse 85  Temp(Src) 98.2 F (36.8 C) (Oral)  Resp 22  Ht  (1.626 m)  Wt 130 lb (58.968 kg)  BMI 22.30 kg/m2  SpO2 100%  LMP 02/11/2015 (Exact Date) Physical Exam  Constitutional: She is oriented to person,  place, and time. She appears well-developed and well-nourished. No distress.  HENT:  Head: Normocephalic and atraumatic.  Normal posterior pharynx.  Dentition is normal.  Space under her tongue is normal  Eyes: EOM are normal.  Neck: Normal range of motion. No tracheal deviation present. No thyromegaly present.  No swelling of the left side of her neck as compared to her right.  No rash or overlying skin changes.  No erythema of the left side of the neck.  Cardiovascular: Normal rate, regular rhythm and normal heart sounds.   Pulmonary/Chest: Effort normal and breath sounds normal. No stridor.  Abdominal: Soft. She exhibits no distension. There is no tenderness.  Musculoskeletal: Normal range of motion.  Lymphadenopathy:     She has no cervical adenopathy.  Neurological: She is alert and oriented to person, place, and time.  Skin: Skin is warm and dry.  Psychiatric: She has a normal mood and affect. Judgment normal.  Nursing note and vitals reviewed.   ED Course  Procedures (including critical care time) Labs Review Labs Reviewed - No data to display  Imaging Review No results found. I have personally reviewed and evaluated these images and lab results as part of my medical decision-making.   EKG Interpretation None      MDM   Final diagnoses:  Neck pain    Warm compresses and anti-inflammatories.  Primary care follow-up.  Patient underwent CT imaging 2 weeks ago his temperature pathology and his had nasopharyngoscopy.  On oral examination and extraoral examination of the neck there are no findings present.  No swelling.  Phonation is normal.  I did not think she needs repeat CT imaging at this time.  She has no carotid bruit over her left carotid.  I do not believe this to carotid dissection.  Primary care follow-up.    Azalia Bilis, MD 03/05/15 [phone]

## 2015-03-29 ENCOUNTER — Emergency Department (HOSPITAL_COMMUNITY)
Admission: EM | Admit: 2015-03-29 | Discharge: 2015-03-29 | Disposition: A | Discharge status: Home / Self Care | Payer: PRIVATE HEALTH INSURANCE | Attending: Emergency Medicine | Admitting: Emergency Medicine

## 2015-03-29 ENCOUNTER — Encounter (HOSPITAL_COMMUNITY): Payer: Self-pay | Admitting: *Deleted

## 2015-03-29 DIAGNOSIS — Z8679 Personal history of other diseases of the circulatory system: Secondary | ICD-10-CM | POA: Diagnosis not present

## 2015-03-29 DIAGNOSIS — Z862 Personal history of diseases of the blood and blood-forming organs and certain disorders involving the immune mechanism: Secondary | ICD-10-CM | POA: Diagnosis not present

## 2015-03-29 DIAGNOSIS — J45909 Unspecified asthma, uncomplicated: Secondary | ICD-10-CM | POA: Insufficient documentation

## 2015-03-29 DIAGNOSIS — H9202 Otalgia, left ear: Secondary | ICD-10-CM | POA: Diagnosis present

## 2015-03-29 DIAGNOSIS — G8929 Other chronic pain: Secondary | ICD-10-CM | POA: Diagnosis not present

## 2015-03-29 DIAGNOSIS — Z720 Tobacco use: Secondary | ICD-10-CM | POA: Diagnosis not present

## 2015-03-29 DIAGNOSIS — Z8719 Personal history of other diseases of the digestive system: Secondary | ICD-10-CM | POA: Insufficient documentation

## 2015-03-29 DIAGNOSIS — R0981 Nasal congestion: Secondary | ICD-10-CM

## 2015-03-29 MED ORDER — IBUPROFEN 400 MG PO TABS
800.0000 mg | ORAL_TABLET | Freq: Once | ORAL | Status: AC
  Administered 2015-03-29: 800 mg via ORAL
  Filled 2015-03-29: qty 2

## 2015-03-29 MED ORDER — PSEUDOEPHEDRINE-GUAIFENESIN ER 120-1200 MG PO TB12: 1.0000 | ORAL_TABLET | Freq: Two times a day (BID) | ORAL | Status: DC

## 2015-03-29 MED ORDER — FLUTICASONE PROPIONATE 50 MCG/ACT NA SUSP: 2.0000 | Freq: Every day | NASAL | Status: DC

## 2015-03-29 MED ORDER — CIPROFLOXACIN-DEXAMETHASONE 0.3-0.1 % OT SUSP
4.0000 [drp] | Freq: Two times a day (BID) | OTIC | Status: AC
  Administered 2015-03-29: 4 [drp] via OTIC
  Filled 2015-03-29: qty 7.5

## 2015-03-29 NOTE — Discharge Instructions (Signed)
Take the prescribed medication as directed. °Follow-up with your primary care physician. °Return to the ED for new or worsening symptoms. ° °

## 2015-03-29 NOTE — ED Provider Notes (Signed)
CSN: [id number]     Arrival date & time 03/29/15  0847 History   First MD Initiated Contact with Patient 03/29/15 0901     Chief Complaint  Patient presents with  . Otalgia     (Consider location/radiation/quality/duration/timing/severity/associated sxs/prior Treatment) Patient is a 44 y.o. female presenting with ear pain. The history is provided by the patient and medical records.  Otalgia Associated symptoms: congestion and rhinorrhea     44 year old female here with migraine headaches, GERD, chronic pain, asthma, anemia, presenting to the ED for left ear pain and nasal congestion for the past 2 days.  Patient states she has had some drainage from her left ear so she has been placing cotton balls and tissue paper into her left ear canal. States she has nasal congestion, sinus pressure, and intermittent rhinorrhea. She denies any sore throat, fever, or chills. No cough, chest pain, or shortness of breath. Patient states she has taken over-the-counter cold medication without relief. Vital signs stable.  Past Medical History  Diagnosis Date  . Migraine   . GERD (gastroesophageal reflux disease)   . Ulcer   . Toothache   . Chronic pain   . Bronchitis   . Sickle cell trait (HCC)   . Asthma   . Anemia    Past Surgical History  Procedure Laterality Date  . Myringotomy      age 21  . Esophagogastroduodenoscopy (egd) with propofol N/A 04/04/2013    Procedure: ESOPHAGOGASTRODUODENOSCOPY (EGD) WITH PROPOFOL;  Surgeon: Willis Modena, MD;  Location: [facility];  Service: Endoscopy;  Laterality: N/A;  . Examination under anesthesia N/A 08/10/2013    Procedure: RECTAL EXAM UNDER ANESTHESIA;  Surgeon: Clovis Pu. Cornett, MD;  Location: [facility];  Service: General;  Laterality: N/A;  ligation   Family History  Problem Relation Age of Onset  . Heart disease Mother   . Kidney disease Mother   . Hypertension Mother   . COPD Mother   . Diabetes Mother   . Heart disease  Father   . Crohn's disease Maternal Aunt    Social History  Substance Use Topics  . Smoking status: Current Every Day Smoker -- 0.50 packs/day for 15 years    Types: Cigarettes  . Smokeless tobacco: Never Used  . Alcohol Use: Yes     Comment: very rare   OB History    No data available     Review of Systems  HENT: Positive for congestion, ear pain, postnasal drip, rhinorrhea and sinus pressure.   All other systems reviewed and are negative.     Allergies  Hydrocodone-acetaminophen  Home Medications   Prior to Admission medications   Medication Sig Start Date End Date Taking? Authorizing Provider  fluticasone (FLONASE) 50 MCG/ACT nasal spray Place 2 sprays into both nostrils daily. 03/29/15   Garlon Hatchet, PA-C  ondansetron (ZOFRAN ODT) 4 MG disintegrating tablet Take 1 tablet (4 mg total) by mouth every 8 (eight) hours as needed for nausea or vomiting. 02/23/15   Emilia Beck, PA-C  Pseudoephedrine-Guaifenesin (MUCINEX D) [phone] MG TB12 Take 1 tablet by mouth 2 (two) times daily. 03/29/15   Garlon Hatchet, PA-C  SUMAtriptan (IMITREX) 6 MG/0.5ML SOLN injection Inject 6 mg into the skin every 2 (two) hours as needed for migraine or headache (migraine). May repeat in 2 hours if headache persists or recurs.    Historical Provider, MD   BP 102/74 mmHg  Pulse 88  Temp(Src) 97.9 F (36.6 C) (Oral)  Resp 18  SpO2 100%  LMP 03/16/2015   Physical Exam  Constitutional: She is oriented to person, place, and time. She appears well-developed and well-nourished. No distress.  HENT:  Head: Normocephalic and atraumatic.  Right Ear: Tympanic membrane and ear canal normal.  Left Ear: There is drainage and swelling.  Nose: Mucosal edema present. No rhinorrhea, sinus tenderness or nasal deformity.  Mouth/Throat: Uvula is midline, oropharynx is clear and moist and mucous membranes are normal. No oropharyngeal exudate, posterior oropharyngeal edema, posterior oropharyngeal erythema  or tonsillar abscesses.  Nasal congestion and PND noted; some drainage and swelling in left EAC but TM appears normal; no mastoid tenderness  Eyes: Conjunctivae and EOM are normal. Pupils are equal, round, and reactive to light.  Neck: Normal range of motion. Neck supple.  Cardiovascular: Normal rate, regular rhythm and normal heart sounds.   Pulmonary/Chest: Effort normal and breath sounds normal. No respiratory distress. She has no wheezes.  Musculoskeletal: Normal range of motion.  Neurological: She is alert and oriented to person, place, and time.  Skin: Skin is warm and dry. She is not diaphoretic.  Psychiatric: She has a normal mood and affect.  Nursing note and vitals reviewed.   ED Course  Procedures (including critical care time) Labs Review Labs Reviewed - No data to display  Imaging Review No results found.    EKG Interpretation None      MDM   Final diagnoses:  Nasal congestion  Otalgia, left   44 year old female here with nasal congestion and left ear pain for the past 2 days. Patient is afebrile, nontoxic. She has a nasal congestion and postnasal drip noted on exam. There is some drainage and swelling in her left EAC, but TM appears normal. No clinical signs of mastoiditis. Exam is largely reassuring, i suspect viral etiology of her nasal congestion. We'll start her on Ciprodex drops for potential left otitis externa, recommended mucinex and flonase for nasal congestion.  FU with PCP.  Discussed plan with patient, he/she acknowledged understanding and agreed with plan of care.  Return precautions given for new or worsening symptoms.  Garlon HatchetLisa M Clementina Mareno, PA-C 03/29/15 1303  Blake DivineJohn Wofford, MD 03/30/15 2101

## 2015-03-29 NOTE — ED Notes (Signed)
PT reports RT ear pain ,conjested sinus

## 2015-03-29 NOTE — ED Notes (Signed)
Declined W/C at D/C and was escorted to lobby by RN. 

## 2015-04-03 ENCOUNTER — Ambulatory Visit (INDEPENDENT_AMBULATORY_CARE_PROVIDER_SITE_OTHER): Payer: 59 | Admitting: Cardiology

## 2015-04-03 ENCOUNTER — Encounter: Payer: Self-pay | Admitting: Cardiology

## 2015-04-03 ENCOUNTER — Encounter: Payer: Self-pay | Admitting: *Deleted

## 2015-04-03 VITALS — BP 102/70 | HR 99 | Ht 64.0 in | Wt 124.9 lb

## 2015-04-03 DIAGNOSIS — R002 Palpitations: Secondary | ICD-10-CM | POA: Diagnosis not present

## 2015-04-03 DIAGNOSIS — M542 Cervicalgia: Secondary | ICD-10-CM

## 2015-04-03 DIAGNOSIS — R9431 Abnormal electrocardiogram [ECG] [EKG]: Secondary | ICD-10-CM | POA: Insufficient documentation

## 2015-04-03 NOTE — Assessment & Plan Note (Signed)
Echocardiogram with possible prior septal and inferior infarct. Schedule echocardiogram. If no wall motion abnormality will not pursue further evaluation.

## 2015-04-03 NOTE — Assessment & Plan Note (Signed)
Patient counseled on discontinuing. 

## 2015-04-03 NOTE — Patient Instructions (Signed)
Medication Instructions:   NO CHANGE  Testing/Procedures:  Your physician has requested that you have an echocardiogram. Echocardiography is a painless test that uses sound waves to create images of your heart. It provides your doctor with information about the size and shape of your heart and how well your heart's chambers and valves are working. This procedure takes approximately one hour. There are no restrictions for this procedure.   Your physician has requested that you have a carotid duplex. This test is an ultrasound of the carotid arteries in your neck. It looks at blood flow through these arteries that supply the brain with blood. Allow one hour for this exam. There are no restrictions or special instructions.    Follow-Up:  Your physician recommends that you schedule a follow-up appointment in: AS NEEDED PENDING TEST RESULTS

## 2015-04-03 NOTE — Progress Notes (Signed)
HPI: 44 year old female for evaluation of palpitations. Patient developed pain in the left neck area recently. She was seen by ENT and felt not to have an ear infection. She states he thought it may be an inflamed carotid. He also told her she had an irregular pulse. She asked to be seen here. She does not have dyspnea on exertion, orthopnea, PND, pedal edema, exertional chest pain or syncope. Occasional brief skip but no sustained palpitations. No fevers or chills.  Current Outpatient Prescriptions  Medication Sig Dispense Refill  . esomeprazole (NEXIUM) 10 MG packet Take 10 mg by mouth daily as needed.    . fluticasone (FLONASE) 50 MCG/ACT nasal spray Place 2 sprays into both nostrils daily. 16 g 1  . SUMAtriptan (IMITREX) 6 MG/0.5ML SOLN injection Inject 6 mg into the skin every 2 (two) hours as needed for migraine or headache (migraine). May repeat in 2 hours if headache persists or recurs.     No current facility-administered medications for this visit.    Allergies  Allergen Reactions  . Hydrocodone-Acetaminophen Nausea And Vomiting    Patient can tolerate acetaminophen     Past Medical History  Diagnosis Date  . Migraine   . GERD (gastroesophageal reflux disease)   . Ulcer   . Chronic pain   . Sickle cell trait (HCC)   . Asthma   . Anemia     Past Surgical History  Procedure Laterality Date  . Myringotomy      age 44  . Esophagogastroduodenoscopy (egd) with propofol N/A 04/04/2013    Procedure: ESOPHAGOGASTRODUODENOSCOPY (EGD) WITH PROPOFOL;  Surgeon: Willis ModenaWilliam Outlaw, MD;  Location: [facility];  Service: Endoscopy;  Laterality: N/A;  . Examination under anesthesia N/A 08/10/2013    Procedure: RECTAL EXAM UNDER ANESTHESIA;  Surgeon: Clovis Puhomas A. Cornett, MD;  Location: [facility];  Service: General;  Laterality: N/A;  ligation  . Hemorrhoid surgery      Social History   Social History  . Marital Status: Single    Spouse Name: N/A  . Number of  Children: N/A  . Years of Education: N/A   Occupational History  . Not on file.   Social History Main Topics  . Smoking status: Current Every Day Smoker -- 0.50 packs/day for 15 years    Types: Cigarettes  . Smokeless tobacco: Never Used  . Alcohol Use: Yes     Comment: very rare  . Drug Use: No  . Sexual Activity: Yes    Birth Control/ Protection: None   Other Topics Concern  . Not on file   Social History Narrative    Family History  Problem Relation Age of Onset  . Heart disease Mother     Arrythmia  . Kidney disease Mother   . Hypertension Mother   . COPD Mother   . Diabetes Mother   . Heart disease Father   . Crohn's disease Maternal Aunt     ROS: no fevers or chills, productive cough, hemoptysis, dysphasia, odynophagia, melena, hematochezia, dysuria, hematuria, rash, seizure activity, orthopnea, PND, pedal edema, claudication. Remaining systems are negative.  Physical Exam:   Blood pressure 102/70, pulse 99, height 5\' 4"  (1.626 m), weight 56.654 kg (124 lb 14.4 oz), last menstrual period 03/16/2015.  General:  Well developed/well nourished in NAD Skin warm/dry, Tattoos. Patient not depressed No peripheral clubbing Back-normal HEENT-normal/normal eyelids Neck supple/normal carotid upstroke bilaterally; no bruits; no JVD; no thyromegaly, Patient is tender to palpation over the left carotid bulb. chest -  CTA/ normal expansion CV - RRR/normal S1 and S2; no murmurs, rubs or gallops;  PMI nondisplaced Abdomen -NT/ND, no HSM, no mass, + bowel sounds, no bruit 2+ femoral pulses, no bruits Ext-no edema, chords, 2+ DP Neuro-grossly nonfocal  ECG Sinus rhythm at a rate of 99. Occasional PACs. Cannot rule out prior septal and inferior infarct.

## 2015-04-03 NOTE — Assessment & Plan Note (Signed)
Electrocardiogram shows occasional PACs. I have explained that in the setting of normal LV function these are benign. They are not particular symptomatic.

## 2015-04-03 NOTE — Assessment & Plan Note (Signed)
Patient describes left neck pain and she is tender over left carotid bulb.ENT felt this may be secondary to inflamed carotid artery. I will arrange a carotid Doppler. If negative she will follow up with primary care.

## 2015-04-10 ENCOUNTER — Other Ambulatory Visit (HOSPITAL_COMMUNITY): Payer: 59

## 2015-04-16 ENCOUNTER — Encounter (HOSPITAL_COMMUNITY): Payer: Self-pay | Admitting: Emergency Medicine

## 2015-04-16 ENCOUNTER — Emergency Department (HOSPITAL_COMMUNITY)
Admission: EM | Admit: 2015-04-16 | Discharge: 2015-04-16 | Disposition: A | Discharge status: Home / Self Care | Payer: PRIVATE HEALTH INSURANCE | Attending: Emergency Medicine | Admitting: Emergency Medicine

## 2015-04-16 ENCOUNTER — Emergency Department (HOSPITAL_COMMUNITY): Payer: PRIVATE HEALTH INSURANCE

## 2015-04-16 DIAGNOSIS — Z8719 Personal history of other diseases of the digestive system: Secondary | ICD-10-CM | POA: Diagnosis not present

## 2015-04-16 DIAGNOSIS — R05 Cough: Secondary | ICD-10-CM | POA: Diagnosis present

## 2015-04-16 DIAGNOSIS — Z862 Personal history of diseases of the blood and blood-forming organs and certain disorders involving the immune mechanism: Secondary | ICD-10-CM | POA: Diagnosis not present

## 2015-04-16 DIAGNOSIS — Z720 Tobacco use: Secondary | ICD-10-CM | POA: Insufficient documentation

## 2015-04-16 DIAGNOSIS — R0981 Nasal congestion: Secondary | ICD-10-CM | POA: Insufficient documentation

## 2015-04-16 DIAGNOSIS — Z8679 Personal history of other diseases of the circulatory system: Secondary | ICD-10-CM | POA: Diagnosis not present

## 2015-04-16 DIAGNOSIS — G8929 Other chronic pain: Secondary | ICD-10-CM | POA: Diagnosis not present

## 2015-04-16 DIAGNOSIS — J45909 Unspecified asthma, uncomplicated: Secondary | ICD-10-CM | POA: Diagnosis not present

## 2015-04-16 DIAGNOSIS — R059 Cough, unspecified: Secondary | ICD-10-CM

## 2015-04-16 MED ORDER — BENZONATATE 100 MG PO CAPS: 100.0000 mg | ORAL_CAPSULE | Freq: Three times a day (TID) | ORAL | Status: DC | PRN

## 2015-04-16 MED ORDER — BENZONATATE 100 MG PO CAPS
100.0000 mg | ORAL_CAPSULE | Freq: Once | ORAL | Status: AC
  Administered 2015-04-16: 100 mg via ORAL
  Filled 2015-04-16: qty 1

## 2015-04-16 MED ORDER — AZITHROMYCIN 250 MG PO TABS: 250.0000 mg | ORAL_TABLET | Freq: Every day | ORAL | Status: DC

## 2015-04-16 NOTE — ED Notes (Signed)
C/o non-productive cough x 3 weeks.

## 2015-04-16 NOTE — Discharge Instructions (Signed)
Cough, Adult A cough helps to clear your throat and lungs. A cough may last only 2-3 weeks (acute), or it may last longer than 8 weeks (chronic). Many different things can cause a cough. A cough may be a sign of an illness or another medical condition. HOME CARE  Pay attention to any changes in your cough.  Take medicines only as told by your doctor.  If you were prescribed an antibiotic medicine, take it as told by your doctor. Do not stop taking it even if you start to feel better.  Talk with your doctor before you try using a cough medicine.  Drink enough fluid to keep your pee (urine) clear or pale yellow.  If the air is dry, use a cold steam vaporizer or humidifier in your home.  Stay away from things that make you cough at work or at home.  If your cough is worse at night, try using extra pillows to raise your head up higher while you sleep.  Do not smoke, and try not to be around smoke. If you need help quitting, ask your doctor.  Do not have caffeine.  Do not drink alcohol.  Rest as needed. GET HELP IF:  You have new problems (symptoms).  You cough up yellow fluid (pus).  Your cough does not get better after 2-3 weeks, or your cough gets worse.  Medicine does not help your cough and you are not sleeping well.  You have pain that gets worse or pain that is not helped with medicine.  You have a fever.  You are losing weight and you do not know why.  You have night sweats. GET HELP RIGHT AWAY IF:  You cough up blood.  You have trouble breathing.  Your heartbeat is very fast.   This information is not intended to replace advice given to you by your health care provider. Make sure you discuss any questions you have with your health care provider.   Document Released: 02/13/2011 Document Revised: 02/21/2015 Document Reviewed: 08/09/2014 Elsevier Interactive Patient Education 2016 [employer]  Acute Bronchitis  Bronchitis is inflammation of the airways  that extend from the windpipe into the lungs (bronchi). The inflammation often causes mucus to develop. This leads to a cough, which is the most common symptom of bronchitis.   In acute bronchitis, the condition usually develops suddenly and goes away over time, usually in a couple weeks. Smoking, allergies, and asthma can make bronchitis worse. Repeated episodes of bronchitis may cause further lung problems.  CAUSES  Acute bronchitis is most often caused by the same virus that causes a cold. The virus can spread from person to person (contagious) through coughing, sneezing, and touching contaminated objects.  SIGNS AND SYMPTOMS  Cough.  Fever.  Coughing up mucus.  Body aches.  Chest congestion.  Chills.  Shortness of breath.  Sore throat.  DIAGNOSIS  Acute bronchitis is usually diagnosed through a physical exam. Your health care provider will also ask you questions about your medical history. Tests, such as chest X-rays, are sometimes done to rule out other conditions.  TREATMENT  Acute bronchitis usually goes away in a couple weeks. Oftentimes, no medical treatment is necessary. Medicines are sometimes given for relief of fever or cough. Antibiotic medicines are usually not needed but may be prescribed in certain situations. In some cases, an inhaler may be recommended to help reduce shortness of breath and control the cough. A cool mist vaporizer may also be used to help thin bronchial  secretions and make it easier to clear the chest.  HOME CARE INSTRUCTIONS  Get plenty of rest.  Drink enough fluids to keep your urine clear or pale yellow (unless you have a medical condition that requires fluid restriction). Increasing fluids may help thin your respiratory secretions (sputum) and reduce chest congestion, and it will prevent dehydration.  Take medicines only as directed by your health care provider.  If you were prescribed an antibiotic medicine, finish it all even if you start to feel  better.  Avoid smoking and secondhand smoke. Exposure to cigarette smoke or irritating chemicals will make bronchitis worse. If you are a smoker, consider using nicotine gum or skin patches to help control withdrawal symptoms. Quitting smoking will help your lungs heal faster.  Reduce the chances of another bout of acute bronchitis by washing your hands frequently, avoiding people with cold symptoms, and trying not to touch your hands to your mouth, nose, or eyes.  Keep all follow-up visits as directed by your health care provider.  SEEK MEDICAL CARE IF:  Your symptoms do not improve after 1 week of treatment.  SEEK IMMEDIATE MEDICAL CARE IF:  You develop an increased fever or chills.  You have chest pain.  You have severe shortness of breath.  You have bloody sputum.  You develop dehydration.  You faint or repeatedly feel like you are going to pass out.  You develop repeated vomiting.  You develop a severe headache. MAKE SURE YOU:  Understand these instructions.  Will watch your condition.  Will get help right away if you are not doing well or get worse. This information is not intended to replace advice given to you by your health care provider. Make sure you discuss any questions you have with your health care provider.  Document Released: 07/10/2004 Document Revised: 06/23/2014 Document Reviewed: 11/23/2012  Elsevier Interactive Patient Education [employer]

## 2015-04-16 NOTE — ED Notes (Signed)
Pt verbalized understanding of d/c instructions and has no further questions. NAD

## 2015-04-16 NOTE — ED Provider Notes (Signed)
CSN: [id number]     Arrival date & time 04/16/15  0033 History   First MD Initiated Contact with Patient 04/16/15 0124     Chief Complaint  Patient presents with  . Cough     (Consider location/radiation/quality/duration/timing/severity/associated sxs/prior Treatment) HPI   Jenny Evans is a 44 y.o. female with PMH significant for migraine, GERD, chronic pain, and asthma who presents with constant gradually worsening persistent, dry, nonproductive cough for the past 3 weeks. Worse with lying down. She has tried NyQuil, Alka-Seltzer, and Mucinex. Nothing makes it better.  Endorses nasal congestion. Denies fever, chills, shortness of breath, myalgias, sore throat, ear pain, chest pain, or neck pain. She reports she smokes about 4 cigarettes a day.   Past Medical History  Diagnosis Date  . Migraine   . GERD (gastroesophageal reflux disease)   . Ulcer   . Chronic pain   . Sickle cell trait (HCC)   . Asthma   . Anemia    Past Surgical History  Procedure Laterality Date  . Myringotomy      age 32  . Esophagogastroduodenoscopy (egd) with propofol N/A 04/04/2013    Procedure: ESOPHAGOGASTRODUODENOSCOPY (EGD) WITH PROPOFOL;  Surgeon: Willis Modena, MD;  Location: [facility];  Service: Endoscopy;  Laterality: N/A;  . Examination under anesthesia N/A 08/10/2013    Procedure: RECTAL EXAM UNDER ANESTHESIA;  Surgeon: Clovis Pu. Cornett, MD;  Location: [facility];  Service: General;  Laterality: N/A;  ligation  . Hemorrhoid surgery     Family History  Problem Relation Age of Onset  . Heart disease Mother     Arrythmia  . Kidney disease Mother   . Hypertension Mother   . COPD Mother   . Diabetes Mother   . Heart disease Father   . Crohn's disease Maternal Aunt    Social History  Substance Use Topics  . Smoking status: Current Every Day Smoker -- 0.50 packs/day for 15 years    Types: Cigarettes  . Smokeless tobacco: Never Used  . Alcohol Use: Yes     Comment:  very rare   OB History    No data available     Review of Systems All other systems negative unless otherwise stated in HPI    Allergies  Hydrocodone-acetaminophen  Home Medications   Prior to Admission medications   Medication Sig Start Date End Date Taking? Authorizing Provider  azithromycin (ZITHROMAX) 250 MG tablet Take 1 tablet (250 mg total) by mouth daily. Take first 2 tablets together, then 1 every day until finished. 04/16/15   Cheri Fowler, PA-C  benzonatate (TESSALON) 100 MG capsule Take 1 capsule (100 mg total) by mouth 3 (three) times daily as needed for cough. 04/16/15   Cheri Fowler, PA-C  esomeprazole (NEXIUM) 10 MG packet Take 10 mg by mouth daily as needed.    Historical Provider, MD  fluticasone (FLONASE) 50 MCG/ACT nasal spray Place 2 sprays into both nostrils daily. 03/29/15   Garlon Hatchet, PA-C  SUMAtriptan (IMITREX) 6 MG/0.5ML SOLN injection Inject 6 mg into the skin every 2 (two) hours as needed for migraine or headache (migraine). May repeat in 2 hours if headache persists or recurs.    Historical Provider, MD   BP 106/68 mmHg  Pulse 72  Temp(Src) 97.9 F (36.6 C) (Oral)  Resp 16  SpO2 100%  LMP 03/16/2015 Physical Exam  Constitutional: She is oriented to person, place, and time. She appears well-developed and well-nourished. No distress.  HENT:  Head: Normocephalic  and atraumatic.  Right Ear: Tympanic membrane and external ear normal.  Left Ear: Tympanic membrane and external ear normal.  Nose: Nose normal. No rhinorrhea.  Mouth/Throat: Uvula is midline, oropharynx is clear and moist and mucous membranes are normal. No trismus in the jaw. No uvula swelling. No oropharyngeal exudate, posterior oropharyngeal edema or posterior oropharyngeal erythema.  Eyes: Conjunctivae are normal. Pupils are equal, round, and reactive to light.  Neck: Normal range of motion. Neck supple.  Cardiovascular: Normal rate, regular rhythm and normal heart sounds.   No murmur  heard. Pulmonary/Chest: Effort normal and breath sounds normal. No accessory muscle usage or stridor. No respiratory distress. She has no wheezes. She has no rhonchi. She has no rales.  Abdominal: Soft. Bowel sounds are normal. She exhibits no distension. There is no tenderness.  Musculoskeletal: Normal range of motion.  Lymphadenopathy:    She has no cervical adenopathy.  Neurological: She is alert and oriented to person, place, and time.  Speech clear without dysarthria.  Skin: Skin is warm and dry. She is not diaphoretic.  Psychiatric: She has a normal mood and affect. Her behavior is normal.    ED Course  Procedures (including critical care time) Labs Review Labs Reviewed - No data to display  Imaging Review Dg Chest 2 View  04/16/2015  CLINICAL DATA:  Cough for 3 weeks. EXAM: CHEST  2 VIEW COMPARISON:  Chest radiograph May 01, 2014 FINDINGS: Cardiomediastinal silhouette is normal. The lungs are clear without pleural effusions or focal consolidations. Trachea projects midline and there is no pneumothorax. Soft tissue planes and included osseous structures are non-suspicious. IMPRESSION: No acute cardiopulmonary process. Electronically Signed   By: Awilda Metroourtnay  Bloomer M.D.   On: 04/16/2015 02:59   I have personally reviewed and evaluated these images and lab results as part of my medical decision-making.   EKG Interpretation None      MDM   Final diagnoses:  Cough    Patient presents with 3 week history of dry nonproductive cough. Denies fever, chills, sore throat, myalgias, shortness of breath. VSS, NAD. On exam, moist mucous membranes. Uvula midline. No oropharyngeal erythema. Heart RRR, lungs clear to auscultation bilaterally, no wheezing, abdomen soft and benign. Will obtain chest x-ray. No evidence of PNA.  Will give Tessalon Perle.  Given prolonged cough and smoking history will give azithromycin.  Evaluation does not show pathology requring ongoing emergent  intervention or admission. Pt is hemodynamically stable and mentating appropriately. Discussed findings/results and plan with patient/guardian, who agrees with plan. All questions answered. Return precautions discussed and outpatient follow up given.      Cheri FowlerKayla Jermery Caratachea, PA-C 04/16/15 [id number]  Lyndal Pulleyaniel Knott, MD 04/17/15 [phone]

## 2015-04-18 ENCOUNTER — Inpatient Hospital Stay (HOSPITAL_COMMUNITY): Admission: RE | Admit: 2015-04-18 | Payer: 59 | Source: Ambulatory Visit

## 2015-04-19 ENCOUNTER — Emergency Department (HOSPITAL_COMMUNITY)
Admission: EM | Admit: 2015-04-19 | Discharge: 2015-04-20 | Disposition: A | Discharge status: Home / Self Care | Payer: PRIVATE HEALTH INSURANCE | Attending: Emergency Medicine | Admitting: Emergency Medicine

## 2015-04-19 ENCOUNTER — Encounter (HOSPITAL_COMMUNITY): Payer: Self-pay | Admitting: Emergency Medicine

## 2015-04-19 DIAGNOSIS — Z79899 Other long term (current) drug therapy: Secondary | ICD-10-CM | POA: Insufficient documentation

## 2015-04-19 DIAGNOSIS — Z862 Personal history of diseases of the blood and blood-forming organs and certain disorders involving the immune mechanism: Secondary | ICD-10-CM | POA: Diagnosis not present

## 2015-04-19 DIAGNOSIS — S0001XA Abrasion of scalp, initial encounter: Secondary | ICD-10-CM | POA: Diagnosis not present

## 2015-04-19 DIAGNOSIS — T07XXXA Unspecified multiple injuries, initial encounter: Secondary | ICD-10-CM

## 2015-04-19 DIAGNOSIS — Z7951 Long term (current) use of inhaled steroids: Secondary | ICD-10-CM | POA: Diagnosis not present

## 2015-04-19 DIAGNOSIS — Y998 Other external cause status: Secondary | ICD-10-CM | POA: Insufficient documentation

## 2015-04-19 DIAGNOSIS — G8929 Other chronic pain: Secondary | ICD-10-CM | POA: Insufficient documentation

## 2015-04-19 DIAGNOSIS — S5012XA Contusion of left forearm, initial encounter: Secondary | ICD-10-CM | POA: Diagnosis not present

## 2015-04-19 DIAGNOSIS — Z23 Encounter for immunization: Secondary | ICD-10-CM | POA: Insufficient documentation

## 2015-04-19 DIAGNOSIS — Y9389 Activity, other specified: Secondary | ICD-10-CM | POA: Insufficient documentation

## 2015-04-19 DIAGNOSIS — K219 Gastro-esophageal reflux disease without esophagitis: Secondary | ICD-10-CM | POA: Insufficient documentation

## 2015-04-19 DIAGNOSIS — Z792 Long term (current) use of antibiotics: Secondary | ICD-10-CM | POA: Diagnosis not present

## 2015-04-19 DIAGNOSIS — J45909 Unspecified asthma, uncomplicated: Secondary | ICD-10-CM | POA: Insufficient documentation

## 2015-04-19 DIAGNOSIS — Y9289 Other specified places as the place of occurrence of the external cause: Secondary | ICD-10-CM | POA: Diagnosis not present

## 2015-04-19 DIAGNOSIS — Z720 Tobacco use: Secondary | ICD-10-CM | POA: Diagnosis not present

## 2015-04-19 DIAGNOSIS — S50812A Abrasion of left forearm, initial encounter: Secondary | ICD-10-CM | POA: Insufficient documentation

## 2015-04-19 DIAGNOSIS — G43909 Migraine, unspecified, not intractable, without status migrainosus: Secondary | ICD-10-CM | POA: Insufficient documentation

## 2015-04-19 DIAGNOSIS — S59912A Unspecified injury of left forearm, initial encounter: Secondary | ICD-10-CM | POA: Diagnosis present
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
## 2015-04-20 ENCOUNTER — Emergency Department (HOSPITAL_COMMUNITY): Payer: PRIVATE HEALTH INSURANCE

## 2015-04-20 MED ORDER — TETANUS-DIPHTH-ACELL PERTUSSIS 5-2.5-18.5 LF-MCG/0.5 IM SUSP
0.5000 mL | Freq: Once | INTRAMUSCULAR | Status: AC
  Administered 2015-04-20: 0.5 mL via INTRAMUSCULAR
  Filled 2015-04-20: qty 0.5

## 2015-04-20 MED ORDER — OXYCODONE-ACETAMINOPHEN 5-325 MG PO TABS
1.0000 | ORAL_TABLET | Freq: Once | ORAL | Status: AC
  Administered 2015-04-20: 1 via ORAL
  Filled 2015-04-20: qty 1

## 2015-04-20 MED ORDER — BACITRACIN ZINC 500 UNIT/GM EX OINT
1.0000 "application " | TOPICAL_OINTMENT | Freq: Two times a day (BID) | CUTANEOUS | Status: DC
  Administered 2015-04-20: 1 via TOPICAL
  Filled 2015-04-20: qty 1.8

## 2015-04-20 NOTE — ED Notes (Signed)
Pt alert, oriented, and ambulatory upon DC.  She was advised to follow up with PCP. 
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
## 2015-04-20 NOTE — ED Notes (Signed)
Pt returned from XRAY 

## 2015-04-20 NOTE — ED Notes (Addendum)
Pt ambulated to XRAY with steady gait 

## 2015-04-25 ENCOUNTER — Inpatient Hospital Stay (HOSPITAL_COMMUNITY): Admission: RE | Admit: 2015-04-25 | Payer: 59 | Source: Ambulatory Visit

## 2015-08-31 ENCOUNTER — Emergency Department (HOSPITAL_COMMUNITY)
Admission: EM | Admit: 2015-08-31 | Discharge: 2015-08-31 | Disposition: A | Discharge status: Home / Self Care | Payer: Managed Care, Other (non HMO) | Attending: Emergency Medicine | Admitting: Emergency Medicine

## 2015-08-31 ENCOUNTER — Encounter (HOSPITAL_COMMUNITY): Payer: Self-pay | Admitting: *Deleted

## 2015-08-31 DIAGNOSIS — Z862 Personal history of diseases of the blood and blood-forming organs and certain disorders involving the immune mechanism: Secondary | ICD-10-CM | POA: Diagnosis not present

## 2015-08-31 DIAGNOSIS — J069 Acute upper respiratory infection, unspecified: Secondary | ICD-10-CM

## 2015-08-31 DIAGNOSIS — F17210 Nicotine dependence, cigarettes, uncomplicated: Secondary | ICD-10-CM | POA: Diagnosis not present

## 2015-08-31 DIAGNOSIS — G8929 Other chronic pain: Secondary | ICD-10-CM | POA: Diagnosis not present

## 2015-08-31 DIAGNOSIS — R05 Cough: Secondary | ICD-10-CM

## 2015-08-31 DIAGNOSIS — Z7951 Long term (current) use of inhaled steroids: Secondary | ICD-10-CM | POA: Diagnosis not present

## 2015-08-31 DIAGNOSIS — K219 Gastro-esophageal reflux disease without esophagitis: Secondary | ICD-10-CM | POA: Diagnosis not present

## 2015-08-31 DIAGNOSIS — B349 Viral infection, unspecified: Secondary | ICD-10-CM

## 2015-08-31 DIAGNOSIS — G43909 Migraine, unspecified, not intractable, without status migrainosus: Secondary | ICD-10-CM | POA: Insufficient documentation

## 2015-08-31 DIAGNOSIS — J45909 Unspecified asthma, uncomplicated: Secondary | ICD-10-CM | POA: Diagnosis not present

## 2015-08-31 DIAGNOSIS — R059 Cough, unspecified: Secondary | ICD-10-CM

## 2015-08-31 DIAGNOSIS — R0981 Nasal congestion: Secondary | ICD-10-CM

## 2015-08-31 MED ORDER — IBUPROFEN 800 MG PO TABS: 800.0000 mg | ORAL_TABLET | Freq: Three times a day (TID) | ORAL | Status: DC

## 2015-08-31 MED ORDER — FLUTICASONE PROPIONATE 50 MCG/ACT NA SUSP: 2.0000 | Freq: Every day | NASAL | Status: DC

## 2015-08-31 MED ORDER — GUAIFENESIN ER 600 MG PO TB12: 600.0000 mg | ORAL_TABLET | Freq: Two times a day (BID) | ORAL | Status: DC | PRN

## 2015-08-31 MED ORDER — BENZONATATE 100 MG PO CAPS: 100.0000 mg | ORAL_CAPSULE | Freq: Three times a day (TID) | ORAL | Status: DC

## 2015-08-31 NOTE — ED Notes (Signed)
Sinus headache, cough, on and off chills, weak feeling

## 2015-08-31 NOTE — Discharge Instructions (Signed)
Upper Respiratory Infection, Adult Most upper respiratory infections (URIs) are a viral infection of the air passages leading to the lungs. A URI affects the nose, throat, and upper air passages. The most common type of URI is nasopharyngitis and is typically referred to as "the common cold." URIs run their course and usually go away on their own. Most of the time, a URI does not require medical attention, but sometimes a bacterial infection in the upper airways can follow a viral infection. This is called a secondary infection. Sinus and middle ear infections are common types of secondary upper respiratory infections. Bacterial pneumonia can also complicate a URI. A URI can worsen asthma and chronic obstructive pulmonary disease (COPD). Sometimes, these complications can require emergency medical care and may be life threatening.  CAUSES Almost all URIs are caused by viruses. A virus is a type of germ and can spread from one person to another.  RISKS FACTORS You may be at risk for a URI if:   You smoke.   You have chronic heart or lung disease.  You have a weakened defense (immune) system.   You are very young or very old.   You have nasal allergies or asthma.  You work in crowded or poorly ventilated areas.  You work in health care facilities or schools. SIGNS AND SYMPTOMS  Symptoms typically develop 2-3 days after you come in contact with a cold virus. Most viral URIs last 7-10 days. However, viral URIs from the influenza virus (flu virus) can last 14-18 days and are typically more severe. Symptoms may include:   Runny or stuffy (congested) nose.   Sneezing.   Cough.   Sore throat.   Headache.   Fatigue.   Fever.   Loss of appetite.   Pain in your forehead, behind your eyes, and over your cheekbones (sinus pain).  Muscle aches.  DIAGNOSIS  Your health care provider may diagnose a URI by:  Physical exam.  Tests to check that your symptoms are not due to  another condition such as:  Strep throat.  Sinusitis.  Pneumonia.  Asthma. TREATMENT  A URI goes away on its own with time. It cannot be cured with medicines, but medicines may be prescribed or recommended to relieve symptoms. Medicines may help:  Reduce your fever.  Reduce your cough.  Relieve nasal congestion. HOME CARE INSTRUCTIONS   Take medicines only as directed by your health care provider.   Gargle warm saltwater or take cough drops to comfort your throat as directed by your health care provider.  Use a warm mist humidifier or inhale steam from a shower to increase air moisture. This may make it easier to breathe.  Drink enough fluid to keep your urine clear or pale yellow.   Eat soups and other clear broths and maintain good nutrition.   Rest as needed.   Return to work when your temperature has returned to normal or as your health care provider advises. You may need to stay home longer to avoid infecting others. You can also use a face mask and careful hand washing to prevent spread of the virus.  Increase the usage of your inhaler if you have asthma.   Do not use any tobacco products, including cigarettes, chewing tobacco, or electronic cigarettes. If you need help quitting, ask your health care provider. PREVENTION  The best way to protect yourself from getting a cold is to practice good hygiene.   Avoid oral or hand contact with people with cold   symptoms.   Wash your hands often if contact occurs.  There is no clear evidence that vitamin C, vitamin E, echinacea, or exercise reduces the chance of developing a cold. However, it is always recommended to get plenty of rest, exercise, and practice good nutrition.  SEEK MEDICAL CARE IF:   You are getting worse rather than better.   Your symptoms are not controlled by medicine.   You have chills.  You have worsening shortness of breath.  You have brown or red mucus.  You have yellow or brown nasal  discharge.  You have pain in your face, especially when you bend forward.  You have a fever.  You have swollen neck glands.  You have pain while swallowing.  You have white areas in the back of your throat. SEEK IMMEDIATE MEDICAL CARE IF:   You have severe or persistent:  Headache.  Ear pain.  Sinus pain.  Chest pain.  You have chronic lung disease and any of the following:  Wheezing.  Prolonged cough.  Coughing up blood.  A change in your usual mucus.  You have a stiff neck.  You have changes in your:  Vision.  Hearing.  Thinking.  Mood. MAKE SURE YOU:   Understand these instructions.  Will watch your condition.  Will get help right away if you are not doing well or get worse.   This information is not intended to replace advice given to you by your health care provider. Make sure you discuss any questions you have with your health care provider.   Document Released: 11/26/2000 Document Revised: 10/17/2014 Document Reviewed: 09/07/2013 Elsevier Interactive Patient Education 2016 [employer]  

## 2015-08-31 NOTE — ED Provider Notes (Signed)
CSN: [id number]     Arrival date & time 08/31/15  0827 History   First MD Initiated Contact with Patient 08/31/15 0901     Chief Complaint  Patient presents with  . URI    HPI   Jenny Evans is an 45 y.o. female with history of GERD, asthma who presents tot he ED for evaluation of cough, congestion, chills, and fatigue. She states her symptoms have been going on for the past 5 days to one week. She reports dry cough with nasal/sinus congestion. She states she has felt hot then cold, but unsure if she has been febrile. States she has felt very tired and weak, and feels like she cannot go to work. She has tried OTC meds with no relief. Denies sick contacts. Denies chest pain or SOB. Denies abd pain, n/v/d.   Past Medical History  Diagnosis Date  . Migraine   . GERD (gastroesophageal reflux disease)   . Ulcer   . Chronic pain   . Sickle cell trait (HCC)   . Asthma   . Anemia    Past Surgical History  Procedure Laterality Date  . Myringotomy      age 71  . Esophagogastroduodenoscopy (egd) with propofol N/A 04/04/2013    Procedure: ESOPHAGOGASTRODUODENOSCOPY (EGD) WITH PROPOFOL;  Surgeon: Willis Modena, MD;  Location: [facility];  Service: Endoscopy;  Laterality: N/A;  . Examination under anesthesia N/A 08/10/2013    Procedure: RECTAL EXAM UNDER ANESTHESIA;  Surgeon: Clovis Pu. Cornett, MD;  Location: [facility];  Service: General;  Laterality: N/A;  ligation  . Hemorrhoid surgery     Family History  Problem Relation Age of Onset  . Heart disease Mother     Arrythmia  . Kidney disease Mother   . Hypertension Mother   . COPD Mother   . Diabetes Mother   . Heart disease Father   . Crohn's disease Maternal Aunt    Social History  Substance Use Topics  . Smoking status: Current Every Day Smoker -- 0.25 packs/day for 15 years    Types: Cigarettes  . Smokeless tobacco: Never Used  . Alcohol Use: Yes     Comment: very rare   OB History    No data available      Review of Systems  All other systems reviewed and are negative.     Allergies  Hydrocodone-acetaminophen  Home Medications   Prior to Admission medications   Medication Sig Start Date End Date Taking? Authorizing Provider  azithromycin (ZITHROMAX) 250 MG tablet Take 1 tablet (250 mg total) by mouth daily. Take first 2 tablets together, then 1 every day until finished. 04/16/15   Cheri Fowler, PA-C  benzonatate (TESSALON) 100 MG capsule Take 1 capsule (100 mg total) by mouth 3 (three) times daily as needed for cough. 04/16/15   Cheri Fowler, PA-C  benzonatate (TESSALON) 100 MG capsule Take 1 capsule (100 mg total) by mouth every 8 (eight) hours. 08/31/15   Ace Gins Charmayne Odell, PA-C  esomeprazole (NEXIUM) 10 MG packet Take 10 mg by mouth daily as needed.    Historical Provider, MD  fluticasone (FLONASE) 50 MCG/ACT nasal spray Place 2 sprays into both nostrils daily. 03/29/15   Garlon Hatchet, PA-C  fluticasone (FLONASE) 50 MCG/ACT nasal spray Place 2 sprays into both nostrils daily. 08/31/15   Ace Gins Sandy Haye, PA-C  guaiFENesin (MUCINEX) 600 MG 12 hr tablet Take 1 tablet (600 mg total) by mouth 2 (two) times daily as needed for to loosen  phlegm. 08/31/15   Ace GinsSerena Y Lavonta Tillis, PA-C  ibuprofen (ADVIL,MOTRIN) 800 MG tablet Take 1 tablet (800 mg total) by mouth 3 (three) times daily. 08/31/15   Ace GinsSerena Y Mikyla Schachter, PA-C  SUMAtriptan (IMITREX) 6 MG/0.5ML SOLN injection Inject 6 mg into the skin every 2 (two) hours as needed for migraine or headache (migraine). May repeat in 2 hours if headache persists or recurs.    Historical Provider, MD   BP 114/82 mmHg  Pulse 94  Temp(Src) 97.9 F (36.6 C) (Oral)  Resp 16  SpO2 96%  LMP 08/22/2015 Physical Exam  Constitutional: She is oriented to person, place, and time. No distress.  HENT:  Head: Atraumatic.  Right Ear: External ear normal.  Left Ear: External ear normal.  Nose: Mucosal edema present. Right sinus exhibits no maxillary sinus tenderness and no frontal sinus  tenderness. Left sinus exhibits no maxillary sinus tenderness and no frontal sinus tenderness.  Mouth/Throat: Oropharynx is clear and moist. No oropharyngeal exudate.  Eyes: Conjunctivae are normal. No scleral icterus.  Neck: Normal range of motion. Neck supple.  Cardiovascular: Normal rate, regular rhythm and normal heart sounds.   Pulmonary/Chest: Effort normal and breath sounds normal. No respiratory distress. She has no wheezes. She has no rales. She exhibits no tenderness.  Abdominal: Soft. She exhibits no distension. There is no tenderness.  Neurological: She is alert and oriented to person, place, and time.  Skin: Skin is warm and dry. She is not diaphoretic.  Psychiatric: She has a normal mood and affect. Her behavior is normal.  Nursing note and vitals reviewed.   ED Course  Procedures (including critical care time) Labs Review Labs Reviewed - No data to display  Imaging Review No results found. I have personally reviewed and evaluated these images and lab results as part of my medical decision-making.   EKG Interpretation None      MDM   Final diagnoses:  URI (upper respiratory infection)  Viral syndrome  Nasal congestion  Cough    Suspect viral syndrome. Pt nontoxic appearing, afebrile, no hypoxia. Given duration of symptoms will not treat as flu. Will give rx for symptomatic tx including tessalon, flonase, mucinex, ibuprofen. Work note given. Resource guide given to establish PCP for f/u. ER return precautions given.     Jenny CoriaSerena Y Brocha Gilliam, PA-C 09/01/15 [id number]  Gerhard Munchobert Lockwood, MD 09/03/15 [phone]

## 2015-11-12 ENCOUNTER — Emergency Department (HOSPITAL_COMMUNITY)
Admission: EM | Admit: 2015-11-12 | Discharge: 2015-11-13 | Disposition: A | Discharge status: Home / Self Care | Payer: Self-pay | Attending: Emergency Medicine | Admitting: Emergency Medicine

## 2015-11-12 ENCOUNTER — Encounter (HOSPITAL_COMMUNITY): Payer: Self-pay

## 2015-11-12 ENCOUNTER — Emergency Department (HOSPITAL_COMMUNITY): Payer: Self-pay

## 2015-11-12 DIAGNOSIS — K922 Gastrointestinal hemorrhage, unspecified: Secondary | ICD-10-CM | POA: Insufficient documentation

## 2015-11-12 DIAGNOSIS — E876 Hypokalemia: Secondary | ICD-10-CM | POA: Diagnosis present

## 2015-11-12 DIAGNOSIS — D500 Iron deficiency anemia secondary to blood loss (chronic): Secondary | ICD-10-CM | POA: Diagnosis present

## 2015-11-12 DIAGNOSIS — F17210 Nicotine dependence, cigarettes, uncomplicated: Secondary | ICD-10-CM | POA: Insufficient documentation

## 2015-11-12 DIAGNOSIS — J45909 Unspecified asthma, uncomplicated: Secondary | ICD-10-CM | POA: Insufficient documentation

## 2015-11-12 DIAGNOSIS — K921 Melena: Secondary | ICD-10-CM | POA: Diagnosis present

## 2015-11-12 DIAGNOSIS — F17200 Nicotine dependence, unspecified, uncomplicated: Secondary | ICD-10-CM | POA: Diagnosis present

## 2015-11-12 LAB — POC OCCULT BLOOD, ED: Fecal Occult Bld: POSITIVE — AB

## 2015-11-12 LAB — COMPREHENSIVE METABOLIC PANEL
ALK PHOS: 52 U/L (ref 38–126)
ALT: 10 U/L — ABNORMAL LOW (ref 14–54)
ANION GAP: 8 (ref 5–15)
AST: 16 U/L (ref 15–41)
Albumin: 4.1 g/dL (ref 3.5–5.0)
BUN: 7 mg/dL (ref 6–20)
CALCIUM: 8.8 mg/dL — AB (ref 8.9–10.3)
CHLORIDE: 106 mmol/L (ref 101–111)
CO2: 21 mmol/L — AB (ref 22–32)
Creatinine, Ser: 0.61 mg/dL (ref 0.44–1.00)
GFR calc non Af Amer: >60 mL/min (ref >60)
Glucose, Bld: 89 mg/dL (ref 65–99)
Potassium: 3 mmol/L — ABNORMAL LOW (ref 3.5–5.1)
SODIUM: 135 mmol/L (ref 135–145)
Total Bilirubin: 0.6 mg/dL (ref 0.3–1.2)
Total Protein: 6.9 g/dL (ref 6.5–8.1)

## 2015-11-12 LAB — CBC WITH DIFFERENTIAL/PLATELET
BASOS ABS: 0.1 10*3/uL (ref 0.0–0.1)
Basophils Relative: 1 %
Eosinophils Absolute: 0.1 10*3/uL (ref 0.0–0.7)
Eosinophils Relative: 1 %
HEMATOCRIT: 21.5 % — AB (ref 36.0–46.0)
Hemoglobin: 6.5 g/dL — CL (ref 12.0–15.0)
LYMPHS ABS: 1.5 10*3/uL (ref 0.7–4.0)
Lymphocytes Relative: 28 %
MCH: 19.2 pg — ABNORMAL LOW (ref 26.0–34.0)
MCHC: 30.2 g/dL (ref 30.0–36.0)
MCV: 63.4 fL — AB (ref 78.0–100.0)
MONO ABS: 0.4 10*3/uL (ref 0.1–1.0)
MONOS PCT: 8 %
NEUTROS ABS: 3.2 10*3/uL (ref 1.7–7.7)
Neutrophils Relative %: 62 %
PLATELETS: 614 10*3/uL — AB (ref 150–400)
RBC: 3.39 MIL/uL — ABNORMAL LOW (ref 3.87–5.11)
RDW: 19.7 % — AB (ref 11.5–15.5)
WBC: 5.3 10*3/uL (ref 4.0–10.5)

## 2015-11-12 LAB — URINALYSIS, ROUTINE W REFLEX MICROSCOPIC
BILIRUBIN URINE: NEGATIVE
Glucose, UA: NEGATIVE mg/dL
Ketones, ur: 15 mg/dL — AB
NITRITE: NEGATIVE
PROTEIN: 100 mg/dL — AB
SPECIFIC GRAVITY, URINE: 1.011 (ref 1.005–1.030)
pH: 5.5 (ref 5.0–8.0)

## 2015-11-12 LAB — URINE MICROSCOPIC-ADD ON

## 2015-11-12 LAB — I-STAT BETA HCG BLOOD, ED (MC, WL, AP ONLY)

## 2015-11-12 LAB — PROTIME-INR
INR: 1.26 (ref 0.00–1.49)
PROTHROMBIN TIME: 16 s — AB (ref 11.6–15.2)

## 2015-11-12 LAB — LIPASE, BLOOD: LIPASE: 20 U/L (ref 11–51)

## 2015-11-12 LAB — PREPARE RBC (CROSSMATCH)

## 2015-11-12 MED ORDER — SODIUM CHLORIDE 0.9 % IV BOLUS (SEPSIS)
1000.0000 mL | Freq: Once | INTRAVENOUS | Status: AC
  Administered 2015-11-12: 1000 mL via INTRAVENOUS

## 2015-11-12 MED ORDER — MORPHINE SULFATE (PF) 4 MG/ML IV SOLN
4.0000 mg | Freq: Once | INTRAVENOUS | Status: AC
  Administered 2015-11-12: 4 mg via INTRAVENOUS
  Filled 2015-11-12: qty 1

## 2015-11-12 MED ORDER — POTASSIUM CHLORIDE 10 MEQ/100ML IV SOLN
10.0000 meq | Freq: Once | INTRAVENOUS | Status: AC
  Administered 2015-11-12: 10 meq via INTRAVENOUS
  Filled 2015-11-12: qty 100

## 2015-11-12 MED ORDER — SODIUM CHLORIDE 0.9 % IV SOLN
10.0000 mL/h | Freq: Once | INTRAVENOUS | Status: AC
  Administered 2015-11-12: 10 mL/h via INTRAVENOUS

## 2015-11-12 MED ORDER — SODIUM CHLORIDE 0.9 % IV SOLN
INTRAVENOUS | Status: DC
  Administered 2015-11-12: 18:00:00 via INTRAVENOUS

## 2015-11-12 MED ORDER — GI COCKTAIL ~~LOC~~
30.0000 mL | Freq: Once | ORAL | Status: AC
  Administered 2015-11-12: 30 mL via ORAL
  Filled 2015-11-12: qty 30

## 2015-11-12 MED ORDER — PANTOPRAZOLE SODIUM 40 MG IV SOLR
40.0000 mg | INTRAVENOUS | Status: AC
  Administered 2015-11-12: 40 mg via INTRAVENOUS
  Filled 2015-11-12: qty 40

## 2015-11-12 NOTE — ED Notes (Signed)
PA made aware of Pt's decreased BP

## 2015-11-12 NOTE — ED Notes (Signed)
Patient c/o abdominal cramping related to menstrual pain. Patient states she has been taking Ibuprofen 800 mg for the menstrual pain and states she has been having "Ulcers act up." Patient c/o burning and aching of the mid abdomen. Patient states she has been taking Nexium with no relief.

## 2015-11-12 NOTE — ED Notes (Signed)
Pt returned from X Ray.

## 2015-11-12 NOTE — ED Notes (Signed)
Patient aware that urine sample is needed. Patient states she cannot void at this time.

## 2015-11-12 NOTE — ED Provider Notes (Signed)
CSN: [id number]     Arrival date & time 11/12/15  1519 History   First MD Initiated Contact with Patient 11/12/15 1622     Chief Complaint  Patient presents with  . Abdominal Pain     (Consider location/radiation/quality/duration/timing/severity/associated sxs/prior Treatment) HPI   Patient is a 45 year old female with past medical history of GERD, PUD, asthma who presents the ED with complaint of abdominal pain. Patient reports 3 days ago she began having worsening mid upper abdominal pain that she describes as a dull burning pain which she notes is consistent with pain she has had in the past associated with her stomach ulcers. She reports having associated intermittent nausea and notes she began having "chocolate colored stools" for one BM 3 days ago. She reports having similar BMs in the past with flare of PUD. She notes she has been taking Nexium at home without relief. Patient also reports having mid/lower abdominal cramping which started yesterday with onset of her menstrual cycle. She reports the pain is consistent with pain she has had in the past related to menstrual cycles but notes it is worse with a combination of her ulcer pain. She notes she has been taking ibuprofen and Excedrin at home without relief. Denies fever, chills, cough, shortness of breath, chest pain, vomiting, diarrhea, constipation, urinary symptoms. Denies hx of abdominal surgeries.   Past Medical History  Diagnosis Date  . Migraine   . GERD (gastroesophageal reflux disease)   . Ulcer   . Chronic pain   . Sickle cell trait (HCC)   . Asthma   . Anemia    Past Surgical History  Procedure Laterality Date  . Myringotomy      age 39  . Esophagogastroduodenoscopy (egd) with propofol N/A 04/04/2013    Procedure: ESOPHAGOGASTRODUODENOSCOPY (EGD) WITH PROPOFOL;  Surgeon: Willis Modena, MD;  Location: [facility];  Service: Endoscopy;  Laterality: N/A;  . Examination under anesthesia N/A 08/10/2013    Procedure:  RECTAL EXAM UNDER ANESTHESIA;  Surgeon: Clovis Pu. Cornett, MD;  Location: [facility];  Service: General;  Laterality: N/A;  ligation  . Hemorrhoid surgery     Family History  Problem Relation Age of Onset  . Heart disease Mother     Arrythmia  . Kidney disease Mother   . Hypertension Mother   . COPD Mother   . Diabetes Mother   . Heart disease Father   . Crohn's disease Maternal Aunt    Social History  Substance Use Topics  . Smoking status: Current Some Day Smoker -- 0.25 packs/day for 15 years    Types: Cigarettes  . Smokeless tobacco: Never Used  . Alcohol Use: Yes     Comment: very rare   OB History    No data available     Review of Systems  Gastrointestinal: Positive for nausea, abdominal pain and blood in stool.  All other systems reviewed and are negative.     Allergies  Hydrocodone-acetaminophen  Home Medications   Prior to Admission medications   Medication Sig Start Date End Date Taking? Authorizing Provider  esomeprazole (NEXIUM) 10 MG packet Take 10 mg by mouth daily as needed (acid reflux).    Yes Historical Provider, MD  SUMAtriptan (IMITREX) 6 MG/0.5ML SOLN injection Inject 6 mg into the skin every 2 (two) hours as needed for migraine or headache (migraine). May repeat in 2 hours if headache persists or recurs.   Yes Historical Provider, MD  azithromycin (ZITHROMAX) 250 MG tablet Take 1 tablet (  250 mg total) by mouth daily. Take first 2 tablets together, then 1 every day until finished. Patient not taking: Reported on 11/12/2015 04/16/15   Cheri Fowler, PA-C  benzonatate (TESSALON) 100 MG capsule Take 1 capsule (100 mg total) by mouth 3 (three) times daily as needed for cough. Patient not taking: Reported on 11/12/2015 04/16/15   Cheri Fowler, PA-C  benzonatate (TESSALON) 100 MG capsule Take 1 capsule (100 mg total) by mouth every 8 (eight) hours. Patient not taking: Reported on 11/12/2015 08/31/15   Ace Gins Sam, PA-C  fluticasone [facility]) 50  MCG/ACT nasal spray Place 2 sprays into both nostrils daily. Patient not taking: Reported on 11/12/2015 03/29/15   Garlon Hatchet, PA-C  fluticasone [facility]) 50 MCG/ACT nasal spray Place 2 sprays into both nostrils daily. Patient not taking: Reported on 11/12/2015 08/31/15   Ace Gins Sam, PA-C  guaiFENesin (MUCINEX) 600 MG 12 hr tablet Take 1 tablet (600 mg total) by mouth 2 (two) times daily as needed for to loosen phlegm. Patient not taking: Reported on 11/12/2015 08/31/15   Ace Gins Sam, PA-C  ibuprofen (ADVIL,MOTRIN) 800 MG tablet Take 1 tablet (800 mg total) by mouth 3 (three) times daily. Patient not taking: Reported on 11/12/2015 08/31/15   Ace Gins Sam, PA-C   BP 106/72 mmHg  Pulse 74  Temp(Src) 98.7 F (37.1 C) (Oral)  Resp 15  Ht 5\' 5"  (1.651 m)  Wt 58.514 kg  BMI 21.47 kg/m2  SpO2 100%  LMP 11/12/2015 Physical Exam  Constitutional: She is oriented to person, place, and time. She appears well-developed and well-nourished. No distress.  HENT:  Head: Normocephalic and atraumatic.  Mouth/Throat: Oropharynx is clear and moist. No oropharyngeal exudate.  Eyes: Conjunctivae and EOM are normal. Right eye exhibits no discharge. Left eye exhibits no discharge. No scleral icterus.  Neck: Normal range of motion. Neck supple.  Cardiovascular: Normal rate, regular rhythm, normal heart sounds and intact distal pulses.   Pulmonary/Chest: Effort normal and breath sounds normal. No respiratory distress. She has no wheezes. She has no rales. She exhibits no tenderness.  Abdominal: Soft. Bowel sounds are normal. She exhibits no distension and no mass. There is tenderness (TTP over periumbilical and epigastric region). There is no rebound and no guarding.  Genitourinary: Rectal exam shows no external hemorrhoid, no internal hemorrhoid, no fissure, no mass, no tenderness and anal tone normal. Guaiac positive stool.  No occult blood on rectal exam.  Musculoskeletal: Normal range of motion. She exhibits  no edema.  Neurological: She is alert and oriented to person, place, and time.  Skin: Skin is warm and dry. She is not diaphoretic.  Nursing note and vitals reviewed.   ED Course  Procedures (including critical care time) Labs Review Labs Reviewed  CBC WITH DIFFERENTIAL/PLATELET - Abnormal; Notable for the following:    RBC 3.39 (*)    Hemoglobin 6.5 (*)    HCT 21.5 (*)    MCV 63.4 (*)    MCH 19.2 (*)    RDW 19.7 (*)    Platelets 614 (*)    All other components within normal limits  COMPREHENSIVE METABOLIC PANEL - Abnormal; Notable for the following:    Potassium 3.0 (*)    CO2 21 (*)    Calcium 8.8 (*)    ALT 10 (*)    All other components within normal limits  POC OCCULT BLOOD, ED - Abnormal; Notable for the following:    Fecal Occult Bld POSITIVE (*)    All  other components within normal limits  LIPASE, BLOOD  URINALYSIS, ROUTINE W REFLEX MICROSCOPIC (NOT [facility])  PROTIME-INR  I-STAT BETA HCG BLOOD, ED ([facility], [facility], [facility] ONLY)  TYPE AND SCREEN  PREPARE RBC (CROSSMATCH)    Imaging Review Dg Abd Acute W/chest  11/12/2015  CLINICAL DATA:  Acute onset of lower abdominal pain. Initial encounter. EXAM: DG ABDOMEN ACUTE W/ 1V CHEST COMPARISON:  Chest radiograph performed 04/16/2015, and CT of the abdomen and pelvis from 07/09/2014 FINDINGS: The lungs are well-aerated and clear. There is no evidence of focal opacification, pleural effusion or pneumothorax. The cardiomediastinal silhouette is within normal limits. The visualized bowel gas pattern is unremarkable. Scattered stool and air are seen within the colon; there is no evidence of small bowel dilatation to suggest obstruction. No free intra-abdominal air is identified on the provided upright view. No acute osseous abnormalities are seen; the sacroiliac joints are unremarkable in appearance. IMPRESSION: 1. Unremarkable bowel gas pattern; no free intra-abdominal air seen. Small amount of stool noted in the colon. 2. No acute  cardiopulmonary process seen. Electronically Signed   By: Roanna RaiderJeffery  Chang M.D.   On: 11/12/2015 20:09   I have personally reviewed and evaluated these images and lab results as part of my medical decision-making.   EKG Interpretation None      MDM   Final diagnoses:  Gastrointestinal hemorrhage, unspecified gastritis, unspecified gastrointestinal hemorrhage type    Patient presents with worsening abdominal pain with associated nausea and melena. History of PUD, GERD. Patient reports taking ibuprofen over the past few days for her menstrual cramps. VSS. Exam revealed tenderness over periumbilical and epigastric region, no peritoneal signs. Patient given IV fluids, pain meds and IV Protonix.  Hemoccult positive. Hemoglobin 6.5. Potassium 3.0. Patient given IV potassium. On reevaluation patient reports her pain has mildly improved. Discussed case with Dr. Clarene DukeLittle who evaluated the pt and dicussed plan for admission. Discussed results and plan for admission with patient for further management of her upper GI bleed. Patient remained hemodynamically stable while in the ED. Abdominal xray negative. INR 1.26.  Consulted hospitalist. Dr. Adela Glimpseoutova reports she will come evaluate the pt in the ED. Upon Dr. Celene Krasoutova's initial evaluation of the pt, patient reported that she can no longer be admitted due to not having anyone to care for her son during her admission. I was notified about patient's decline to admission. I spoke with patient and discussed our concerns for her going home without complete treatment for her GI bleed. Patient reports understanding but continued to state that she would not be able to be admitted due to not having any friends or family to care for her son. Discussed change of admission status with Dr. Clarene DukeLittle. Plan to give patient blood transfusion in the ED prior to discharge.  I was notified by nurse that on repeat vitals patient's blood pressure had dropped to 83/50. I reevaluated the  patient at that time reported that she began having return of her abdominal pain, denied any other sxs. Pt given 2nd liter of IVF and pain meds. S/p IVF, BP increased to 111/77. Both myself and Dr. Clarene DukeLittle has multiple discussions with pt regarding staying for admission for further treatment of her upper GI bleed, pt reported understanding but continued to report that she needed to leave to take care of her son. She reports that she will come back to the ED tomorrow once she has someone to care for her son for admission and further management. Discussed return precautions with  pt that would require immediate return to the ED.   Satira Sark [place], [place] 11/13/15 0052  Laurence Spates, MD 11/13/15 [phone]

## 2015-11-13 LAB — TYPE AND SCREEN
ABO/RH(D): A POS
ANTIBODY SCREEN: NEGATIVE
UNIT DIVISION: 0

## 2015-11-13 MED ORDER — OXYCODONE-ACETAMINOPHEN 5-325 MG PO TABS
1.0000 | ORAL_TABLET | Freq: Once | ORAL | Status: AC
  Administered 2015-11-13: 1 via ORAL
  Filled 2015-11-13: qty 1

## 2015-11-13 NOTE — Discharge Instructions (Signed)
Return to the Emergency department tomorrow for admission for further treatment and management of your GI bleed.  Refrain from taking any NSAIDs (Ibuprofen, Advil) due to their adverse effects relating to your stomach ulcers and GI bleed. Please return to the Emergency Department immediately if symptoms worsen or new onset of fever, worsening abdominal pain, vomiting blood, unable to keep fluids down, bloody stool.

## 2015-11-16 ENCOUNTER — Encounter (HOSPITAL_BASED_OUTPATIENT_CLINIC_OR_DEPARTMENT_OTHER): Payer: Self-pay | Admitting: *Deleted

## 2015-11-16 ENCOUNTER — Emergency Department (HOSPITAL_BASED_OUTPATIENT_CLINIC_OR_DEPARTMENT_OTHER): Payer: Self-pay

## 2015-11-16 ENCOUNTER — Emergency Department (HOSPITAL_BASED_OUTPATIENT_CLINIC_OR_DEPARTMENT_OTHER)
Admission: EM | Admit: 2015-11-16 | Discharge: 2015-11-16 | Disposition: A | Discharge status: Home / Self Care | Payer: Self-pay | Attending: Emergency Medicine | Admitting: Emergency Medicine

## 2015-11-16 DIAGNOSIS — R1013 Epigastric pain: Secondary | ICD-10-CM

## 2015-11-16 DIAGNOSIS — K274 Chronic or unspecified peptic ulcer, site unspecified, with hemorrhage: Secondary | ICD-10-CM | POA: Insufficient documentation

## 2015-11-16 DIAGNOSIS — K625 Hemorrhage of anus and rectum: Secondary | ICD-10-CM

## 2015-11-16 DIAGNOSIS — J45909 Unspecified asthma, uncomplicated: Secondary | ICD-10-CM | POA: Insufficient documentation

## 2015-11-16 DIAGNOSIS — F17210 Nicotine dependence, cigarettes, uncomplicated: Secondary | ICD-10-CM | POA: Insufficient documentation

## 2015-11-16 DIAGNOSIS — K279 Peptic ulcer, site unspecified, unspecified as acute or chronic, without hemorrhage or perforation: Secondary | ICD-10-CM

## 2015-11-16 LAB — COMPREHENSIVE METABOLIC PANEL
ALBUMIN: 3.7 g/dL (ref 3.5–5.0)
ALK PHOS: 54 U/L (ref 38–126)
ALT: 13 U/L — AB (ref 14–54)
ANION GAP: 5 (ref 5–15)
AST: 18 U/L (ref 15–41)
BUN: <5 mg/dL — ABNORMAL LOW (ref 6–20)
CALCIUM: 9 mg/dL (ref 8.9–10.3)
CHLORIDE: 109 mmol/L (ref 101–111)
CO2: 26 mmol/L (ref 22–32)
Creatinine, Ser: 0.59 mg/dL (ref 0.44–1.00)
GFR calc non Af Amer: >60 mL/min (ref >60)
GLUCOSE: 84 mg/dL (ref 65–99)
Potassium: 3.1 mmol/L — ABNORMAL LOW (ref 3.5–5.1)
SODIUM: 140 mmol/L (ref 135–145)
Total Bilirubin: 0.6 mg/dL (ref 0.3–1.2)
Total Protein: 6.5 g/dL (ref 6.5–8.1)

## 2015-11-16 LAB — URINE MICROSCOPIC-ADD ON: WBC UA: NONE SEEN WBC/hpf (ref 0–5)

## 2015-11-16 LAB — CBC WITH DIFFERENTIAL/PLATELET
BASOS ABS: 0 10*3/uL (ref 0.0–0.1)
Basophils Relative: 0 %
EOS ABS: 0.1 10*3/uL (ref 0.0–0.7)
Eosinophils Relative: 1 %
HCT: 24.6 % — ABNORMAL LOW (ref 36.0–46.0)
HEMOGLOBIN: 7.7 g/dL — AB (ref 12.0–15.0)
LYMPHS PCT: 26 %
Lymphs Abs: 1.5 10*3/uL (ref 0.7–4.0)
MCH: 20.9 pg — ABNORMAL LOW (ref 26.0–34.0)
MCHC: 31.3 g/dL (ref 30.0–36.0)
MCV: 66.7 fL — ABNORMAL LOW (ref 78.0–100.0)
MONO ABS: 0.4 10*3/uL (ref 0.1–1.0)
Monocytes Relative: 7 %
NEUTROS PCT: 66 %
Neutro Abs: 3.8 10*3/uL (ref 1.7–7.7)
PLATELETS: 580 10*3/uL — AB (ref 150–400)
RBC: 3.69 MIL/uL — ABNORMAL LOW (ref 3.87–5.11)
RDW: 23.5 % — ABNORMAL HIGH (ref 11.5–15.5)
WBC: 5.8 10*3/uL (ref 4.0–10.5)

## 2015-11-16 LAB — URINALYSIS, ROUTINE W REFLEX MICROSCOPIC
BILIRUBIN URINE: NEGATIVE
Glucose, UA: NEGATIVE mg/dL
Ketones, ur: NEGATIVE mg/dL
Leukocytes, UA: NEGATIVE
NITRITE: NEGATIVE
PH: 7 (ref 5.0–8.0)
Protein, ur: NEGATIVE mg/dL
SPECIFIC GRAVITY, URINE: 1.011 (ref 1.005–1.030)

## 2015-11-16 LAB — PROTIME-INR
INR: 1.11 (ref 0.00–1.49)
Prothrombin Time: 14.5 seconds (ref 11.6–15.2)

## 2015-11-16 LAB — OCCULT BLOOD X 1 CARD TO LAB, STOOL: FECAL OCCULT BLD: NEGATIVE

## 2015-11-16 LAB — LIPASE, BLOOD: Lipase: 20 U/L (ref 11–51)

## 2015-11-16 MED ORDER — POTASSIUM CHLORIDE CRYS ER 20 MEQ PO TBCR
40.0000 meq | EXTENDED_RELEASE_TABLET | Freq: Once | ORAL | Status: AC
  Administered 2015-11-16: 40 meq via ORAL
  Filled 2015-11-16: qty 2

## 2015-11-16 MED ORDER — PANTOPRAZOLE SODIUM 20 MG PO TBEC: 20.0000 mg | DELAYED_RELEASE_TABLET | Freq: Every day | ORAL | Status: DC

## 2015-11-16 MED ORDER — SODIUM CHLORIDE 0.9 % IV BOLUS (SEPSIS)
1000.0000 mL | Freq: Once | INTRAVENOUS | Status: AC
  Administered 2015-11-16: 1000 mL via INTRAVENOUS

## 2015-11-16 MED ORDER — ONDANSETRON HCL 4 MG/2ML IJ SOLN
4.0000 mg | Freq: Once | INTRAMUSCULAR | Status: AC
  Administered 2015-11-16: 4 mg via INTRAVENOUS
  Filled 2015-11-16: qty 2

## 2015-11-16 MED ORDER — MORPHINE SULFATE (PF) 4 MG/ML IV SOLN
4.0000 mg | Freq: Once | INTRAVENOUS | Status: AC
  Administered 2015-11-16: 4 mg via INTRAVENOUS
  Filled 2015-11-16: qty 1

## 2015-11-16 MED ORDER — OXYCODONE-ACETAMINOPHEN 5-325 MG PO TABS
1.0000 | ORAL_TABLET | Freq: Once | ORAL | Status: AC
  Administered 2015-11-16: 1 via ORAL
  Filled 2015-11-16: qty 1

## 2015-11-16 MED ORDER — SUCRALFATE 1 GM/10ML PO SUSP: 1.0000 g | Freq: Three times a day (TID) | ORAL | Status: DC

## 2015-11-16 MED ORDER — FAMOTIDINE IN NACL 20-0.9 MG/50ML-% IV SOLN
20.0000 mg | Freq: Once | INTRAVENOUS | Status: AC
  Administered 2015-11-16: 20 mg via INTRAVENOUS
  Filled 2015-11-16: qty 50

## 2015-11-16 NOTE — ED Notes (Signed)
Abdominal pain. [facility] and had blood a blood transfusion for rectal bleeding. She refused to be admitted. Now she is having bright red rectal bleeding again.

## 2015-11-16 NOTE — ED Provider Notes (Signed)
CSN: [id number]     Arrival date & time 11/16/15  1808 History   First MD Initiated Contact with Patient 11/16/15 1838     Chief Complaint  Patient presents with  . Abdominal Pain   HPI  Jenny Evans is a 45 year old female with PMHx of GERD, PUD, asthma presenting with abdominal pain and rectal bleeding. Pt was seen in ED 3 days ago for same complaint and found to have hemoccult positive stool and Hgb of 6.5. She received blood transfusion in ED and then refused admission and left AMA. She returns with recurrent symptoms. She is complaining of epigastric abdominal pain described as burning. The abdominal pain is worse with eating so she has been avoiding food. She states this is typical of her PUD pain. She endorses some mild nausea without vomiting. She reports taking OTC nexium as needed when she has epigastric pain but does not take a daily GERD medicine. She also noted bright red blood on her toilet paper with BMs. Denies rectal pain, constipation or straining. She admits to frequent goody powder and NSAID use for her frequent abdominal pain. She previously followed with [facility]. Denies fevers, chills, URI symptoms, chest pain, SOB, dizziness, syncope, or GU symptoms.   Past Medical History  Diagnosis Date  . Migraine   . GERD (gastroesophageal reflux disease)   . Ulcer   . Chronic pain   . Sickle cell trait (HCC)   . Asthma   . Anemia    Past Surgical History  Procedure Laterality Date  . Myringotomy      age 43  . Esophagogastroduodenoscopy (egd) with propofol N/A 04/04/2013    Procedure: ESOPHAGOGASTRODUODENOSCOPY (EGD) WITH PROPOFOL;  Surgeon: Willis Modena, MD;  Location: [facility];  Service: Endoscopy;  Laterality: N/A;  . Examination under anesthesia N/A 08/10/2013    Procedure: RECTAL EXAM UNDER ANESTHESIA;  Surgeon: Clovis Pu. Cornett, MD;  Location: [facility];  Service: General;  Laterality: N/A;  ligation  . Hemorrhoid surgery     Family  History  Problem Relation Age of Onset  . Heart disease Mother     Arrythmia  . Kidney disease Mother   . Hypertension Mother   . COPD Mother   . Diabetes Mother   . Heart disease Father   . Crohn's disease Maternal Aunt    Social History  Substance Use Topics  . Smoking status: Current Some Day Smoker -- 0.25 packs/day for 15 years    Types: Cigarettes  . Smokeless tobacco: Never Used  . Alcohol Use: Yes     Comment: very rare   OB History    No data available     Review of Systems  All other systems reviewed and are negative.     Allergies  Hydrocodone-acetaminophen  Home Medications   Prior to Admission medications   Medication Sig Start Date End Date Taking? Authorizing Provider  azithromycin (ZITHROMAX) 250 MG tablet Take 1 tablet (250 mg total) by mouth daily. Take first 2 tablets together, then 1 every day until finished. Patient not taking: Reported on 11/12/2015 04/16/15   Cheri Fowler, PA-C  benzonatate (TESSALON) 100 MG capsule Take 1 capsule (100 mg total) by mouth 3 (three) times daily as needed for cough. Patient not taking: Reported on 11/12/2015 04/16/15   Cheri Fowler, PA-C  benzonatate (TESSALON) 100 MG capsule Take 1 capsule (100 mg total) by mouth every 8 (eight) hours. Patient not taking: Reported on 11/12/2015 08/31/15   Ace Gins  Sam, PA-C  esomeprazole (NEXIUM) 10 MG packet Take 10 mg by mouth daily as needed (acid reflux).     Historical Provider, MD  fluticasone (FLONASE) 50 MCG/ACT nasal spray Place 2 sprays into both nostrils daily. Patient not taking: Reported on 11/12/2015 03/29/15   Garlon HatchetLisa M Sanders, PA-C  fluticasone [facility](FLONASE) 50 MCG/ACT nasal spray Place 2 sprays into both nostrils daily. Patient not taking: Reported on 11/12/2015 08/31/15   Ace GinsSerena Y Sam, PA-C  guaiFENesin (MUCINEX) 600 MG 12 hr tablet Take 1 tablet (600 mg total) by mouth 2 (two) times daily as needed for to loosen phlegm. Patient not taking: Reported on 11/12/2015 08/31/15   Ace GinsSerena Y  Sam, PA-C  ibuprofen (ADVIL,MOTRIN) 800 MG tablet Take 1 tablet (800 mg total) by mouth 3 (three) times daily. Patient not taking: Reported on 11/12/2015 08/31/15   Ace GinsSerena Y Sam, PA-C  pantoprazole (PROTONIX) 20 MG tablet Take 1 tablet (20 mg total) by mouth daily. 11/16/15   Cheyann Blecha, PA-C  sucralfate (CARAFATE) 1 GM/10ML suspension Take 10 mLs (1 g total) by mouth 4 (four) times daily -  with meals and at bedtime. 11/16/15   Jovonta Levit, PA-C  SUMAtriptan (IMITREX) 6 MG/0.5ML SOLN injection Inject 6 mg into the skin every 2 (two) hours as needed for migraine or headache (migraine). May repeat in 2 hours if headache persists or recurs.    Historical Provider, MD   BP 111/80 mmHg  Pulse 66  Temp(Src) 98.5 F (36.9 C) (Oral)  Resp 18  Ht 5\' 5"  (1.651 m)  Wt 58.514 kg  BMI 21.47 kg/m2  SpO2 100%  LMP 11/12/2015 Physical Exam  Constitutional: She appears well-developed and well-nourished. No distress.  HENT:  Head: Normocephalic and atraumatic.  Eyes: Conjunctivae are normal. Right eye exhibits no discharge. Left eye exhibits no discharge. No scleral icterus.  Neck: Normal range of motion.  Cardiovascular: Normal rate and regular rhythm.   Pulmonary/Chest: Effort normal. No respiratory distress.  Abdominal: Soft. Normal appearance. There is tenderness in the epigastric area. There is no rigidity, no rebound and no guarding.  Genitourinary: Rectal exam shows no external hemorrhoid, no fissure and no tenderness. Guaiac negative stool.  No frank blood on rectal exam. Negative hemoccult.   Musculoskeletal: Normal range of motion.  Neurological: She is alert. Coordination normal.  Skin: Skin is warm and dry.  Psychiatric: She has a normal mood and affect. Her behavior is normal.  Nursing note and vitals reviewed.   ED Course  Procedures (including critical care time) Labs Review Labs Reviewed  CBC WITH DIFFERENTIAL/PLATELET - Abnormal; Notable for the following:    RBC 3.69 (*)     Hemoglobin 7.7 (*)    HCT 24.6 (*)    MCV 66.7 (*)    MCH 20.9 (*)    RDW 23.5 (*)    Platelets 580 (*)    All other components within normal limits  COMPREHENSIVE METABOLIC PANEL - Abnormal; Notable for the following:    Potassium 3.1 (*)    BUN <5 (*)    ALT 13 (*)    All other components within normal limits  URINALYSIS, ROUTINE W REFLEX MICROSCOPIC (NOT AT [facility]) - Abnormal; Notable for the following:    APPearance CLOUDY (*)    Hgb urine dipstick MODERATE (*)    All other components within normal limits  URINE MICROSCOPIC-ADD ON - Abnormal; Notable for the following:    Squamous Epithelial / LPF 6-30 (*)    Bacteria, UA RARE (*)  All other components within normal limits  LIPASE, BLOOD  PROTIME-INR  OCCULT BLOOD X 1 CARD TO LAB, STOOL    Imaging Review Dg Abd 1 View  11/16/2015  CLINICAL DATA:  Abdominal pain and rectal bleeding EXAM: ABDOMEN - 1 VIEW COMPARISON:  Nov 12, 2015 FINDINGS: There is moderate stool throughout the colon. There is no bowel dilatation or air-fluid level suggesting obstruction. No free air is seen on this supine examination. There is a phlebolith in the right pelvis. IMPRESSION: Moderate stool throughout colon. Bowel gas pattern unremarkable. No bowel obstruction or free air appreciable. Electronically Signed   By: Bretta Bang III M.D.   On: 11/16/2015 20:11   I have personally reviewed and evaluated these images and lab results as part of my medical decision-making.   EKG Interpretation None      MDM   Final diagnoses:  Epigastric pain  Rectal bleeding  PUD (peptic ulcer disease)   45 year old female with PMHx of PUD presenting with epigastric pain after eating and BRBPR. Afebrile and hemodynamically stable. Pt is nontoxic appearing. Abdomen is soft with epigastric tenderness. No peritoneal signs suggesting surgical abdomen. No frank blood on rectal exam and negative hemoccult. Hgb 7.7. Chart review shows that her baseline Hgb around 8.  Potassium 3.1 which was repleted. Acute abd negative for free air. Pt's symptoms controlled in ED with morphine and pepcid. Pt's Hgb does not indicate blood transfusion today; this is likely acute worsening of her PUD due to poor compliance with daily medications. She denies all symptoms of dizziness, lightheadedness, syncope, chest pain, SOB or palpitations. Strongly encouraged pt to follow up with her gastroenterologist again and take daily PPIs. Strict return precautions given and pt is stable for discharge with outpatient follow up.     Rolm Gala Carlas Vandyne, PA-C 11/16/15 2250  Marily Memos, MD 11/17/15 [phone]

## 2015-11-16 NOTE — Discharge Instructions (Signed)
Peptic Ulcer A peptic ulcer is a sore in the lining of your esophagus (esophageal ulcer), stomach (gastric ulcer), or in the first part of your small intestine (duodenal ulcer). The ulcer causes erosion into the deeper tissue. CAUSES  Normally, the lining of the stomach and the small intestine protects itself from the acid that digests food. The protective lining can be damaged by:  An infection caused by a bacterium called Helicobacter pylori (H. pylori).  Regular use of nonsteroidal anti-inflammatory drugs (NSAIDs), such as ibuprofen or aspirin.  Smoking tobacco. Other risk factors include being older than 50, drinking alcohol excessively, and having a family history of ulcer disease.  SYMPTOMS   Burning pain or gnawing in the area between the chest and the belly button.  Heartburn.  Nausea and vomiting.  Bloating. The pain can be worse on an empty stomach and at night. If the ulcer results in bleeding, it can cause:  Black, tarry stools.  Vomiting of bright red blood.  Vomiting of coffee-ground-looking materials. DIAGNOSIS  A diagnosis is usually made based upon your history and an exam. Other tests and procedures may be performed to find the cause of the ulcer. Finding a cause will help determine the best treatment. Tests and procedures may include:  Blood tests, stool tests, or breath tests to check for the bacterium H. pylori.  An upper gastrointestinal (GI) series of the esophagus, stomach, and small intestine.  An endoscopy to examine the esophagus, stomach, and small intestine.  A biopsy. TREATMENT  Treatment may include:  Eliminating the cause of the ulcer, such as smoking, NSAIDs, or alcohol.  Medicines to reduce the amount of acid in your digestive tract.  Antibiotic medicines if the ulcer is caused by the H. pylori bacterium.  An upper endoscopy to treat a bleeding ulcer.  Surgery if the bleeding is severe or if the ulcer created a hole somewhere in the  digestive system. HOME CARE INSTRUCTIONS   Avoid tobacco, alcohol, and caffeine. Smoking can increase the acid in the stomach, and continued smoking will impair the healing of ulcers.  Avoid foods and drinks that seem to cause discomfort or aggravate your ulcer.  Only take medicines as directed by your caregiver. Do not substitute over-the-counter medicines for prescription medicines without talking to your caregiver.  Keep any follow-up appointments and tests as directed. SEEK MEDICAL CARE IF:   Your do not improve within 7 days of starting treatment.  You have ongoing indigestion or heartburn. SEEK IMMEDIATE MEDICAL CARE IF:   You have sudden, sharp, or persistent abdominal pain.  You have bloody or dark black, tarry stools.  You vomit blood or vomit that looks like coffee grounds.  You become light-headed, weak, or feel faint.  You become sweaty or clammy. MAKE SURE YOU:   Understand these instructions.  Will watch your condition.  Will get help right away if you are not doing well or get worse.   This information is not intended to replace advice given to you by your health care provider. Make sure you discuss any questions you have with your health care provider.   Document Released: 05/30/2000 Document Revised: 06/23/2014 Document Reviewed: 12/31/2011 Elsevier Interactive Patient Education 2016 Elsevier Inc.  Abdominal Pain, Adult Many things can cause abdominal pain. Usually, abdominal pain is not caused by a disease and will improve without treatment. It can often be observed and treated at home. Your health care provider will do a physical exam and possibly order blood tests and X-rays  to help determine the seriousness of your pain. However, in many cases, more time must pass before a clear cause of the pain can be found. Before that point, your health care provider may not know if you need more testing or further treatment. HOME CARE INSTRUCTIONS Monitor your  abdominal pain for any changes. The following actions may help to alleviate any discomfort you are experiencing:  Only take over-the-counter or prescription medicines as directed by your health care provider.  Do not take laxatives unless directed to do so by your health care provider.  Try a clear liquid diet (broth, tea, or water) as directed by your health care provider. Slowly move to a bland diet as tolerated. SEEK MEDICAL CARE IF:  You have unexplained abdominal pain.  You have abdominal pain associated with nausea or diarrhea.  You have pain when you urinate or have a bowel movement.  You experience abdominal pain that wakes you in the night.  You have abdominal pain that is worsened or improved by eating food.  You have abdominal pain that is worsened with eating fatty foods.  You have a fever. SEEK IMMEDIATE MEDICAL CARE IF:  Your pain does not go away within 2 hours.  You keep throwing up (vomiting).  Your pain is felt only in portions of the abdomen, such as the right side or the left lower portion of the abdomen.  You pass bloody or black tarry stools. MAKE SURE YOU:  Understand these instructions.  Will watch your condition.  Will get help right away if you are not doing well or get worse.   This information is not intended to replace advice given to you by your health care provider. Make sure you discuss any questions you have with your health care provider.   Document Released: 03/12/2005 Document Revised: 02/21/2015 Document Reviewed: 02/09/2013 Elsevier Interactive Patient Education [employer] Else[employer] Inc[employer]

## 2016-01-04 ENCOUNTER — Emergency Department (HOSPITAL_COMMUNITY)
Admission: EM | Admit: 2016-01-04 | Discharge: 2016-01-04 | Disposition: A | Discharge status: Home / Self Care | Payer: Managed Care, Other (non HMO) | Attending: Emergency Medicine | Admitting: Emergency Medicine

## 2016-01-04 ENCOUNTER — Encounter (HOSPITAL_COMMUNITY): Payer: Self-pay | Admitting: *Deleted

## 2016-01-04 DIAGNOSIS — Z9622 Myringotomy tube(s) status: Secondary | ICD-10-CM | POA: Insufficient documentation

## 2016-01-04 DIAGNOSIS — F17210 Nicotine dependence, cigarettes, uncomplicated: Secondary | ICD-10-CM | POA: Insufficient documentation

## 2016-01-04 DIAGNOSIS — Z79899 Other long term (current) drug therapy: Secondary | ICD-10-CM | POA: Insufficient documentation

## 2016-01-04 DIAGNOSIS — M545 Low back pain, unspecified: Secondary | ICD-10-CM

## 2016-01-04 DIAGNOSIS — R109 Unspecified abdominal pain: Secondary | ICD-10-CM | POA: Insufficient documentation

## 2016-01-04 DIAGNOSIS — J45909 Unspecified asthma, uncomplicated: Secondary | ICD-10-CM | POA: Insufficient documentation

## 2016-01-04 LAB — CBC WITH DIFFERENTIAL/PLATELET
Basophils Absolute: 0 10*3/uL (ref 0.0–0.1)
Basophils Relative: 0 %
EOS PCT: 1 %
Eosinophils Absolute: 0.1 10*3/uL (ref 0.0–0.7)
HEMATOCRIT: 24.4 % — AB (ref 36.0–46.0)
HEMOGLOBIN: 7.7 g/dL — AB (ref 12.0–15.0)
LYMPHS ABS: 2.7 10*3/uL (ref 0.7–4.0)
LYMPHS PCT: 26 %
MCH: 20.8 pg — ABNORMAL LOW (ref 26.0–34.0)
MCHC: 31.6 g/dL (ref 30.0–36.0)
MCV: 65.9 fL — AB (ref 78.0–100.0)
MONOS PCT: 7 %
Monocytes Absolute: 0.7 10*3/uL (ref 0.1–1.0)
NEUTROS ABS: 6.7 10*3/uL (ref 1.7–7.7)
Neutrophils Relative %: 66 %
Platelets: 564 10*3/uL — ABNORMAL HIGH (ref 150–400)
RBC: 3.7 MIL/uL — AB (ref 3.87–5.11)
RDW: 21.3 % — AB (ref 11.5–15.5)
WBC: 10.2 10*3/uL (ref 4.0–10.5)

## 2016-01-04 LAB — COMPREHENSIVE METABOLIC PANEL
ALK PHOS: 64 U/L (ref 38–126)
ALT: 11 U/L — ABNORMAL LOW (ref 14–54)
AST: 18 U/L (ref 15–41)
Albumin: 4.5 g/dL (ref 3.5–5.0)
Anion gap: 8 (ref 5–15)
BILIRUBIN TOTAL: 0.6 mg/dL (ref 0.3–1.2)
BUN: 7 mg/dL (ref 6–20)
CO2: 23 mmol/L (ref 22–32)
CREATININE: 0.6 mg/dL (ref 0.44–1.00)
Calcium: 9.1 mg/dL (ref 8.9–10.3)
Chloride: 105 mmol/L (ref 101–111)
GFR calc Af Amer: >60 mL/min (ref >60)
GLUCOSE: 109 mg/dL — AB (ref 65–99)
POTASSIUM: 3 mmol/L — AB (ref 3.5–5.1)
Sodium: 136 mmol/L (ref 135–145)
Total Protein: 7.5 g/dL (ref 6.5–8.1)

## 2016-01-04 LAB — URINALYSIS, ROUTINE W REFLEX MICROSCOPIC
Bilirubin Urine: NEGATIVE
GLUCOSE, UA: NEGATIVE mg/dL
HGB URINE DIPSTICK: NEGATIVE
Ketones, ur: NEGATIVE mg/dL
LEUKOCYTES UA: NEGATIVE
Nitrite: NEGATIVE
PH: 6 (ref 5.0–8.0)
PROTEIN: NEGATIVE mg/dL
SPECIFIC GRAVITY, URINE: 1.013 (ref 1.005–1.030)

## 2016-01-04 LAB — POC URINE PREG, ED: Preg Test, Ur: NEGATIVE

## 2016-01-04 LAB — LIPASE, BLOOD: Lipase: 21 U/L (ref 11–51)

## 2016-01-04 NOTE — Discharge Instructions (Signed)
Take 4 over the counter ibuprofen tablets 3 times a day or 2 over-the-counter naproxen tablets twice a day for pain. Also take tylenol 1000mg (2 extra strength) four times a day.      Back Pain, Adult Back pain is very common in adults.The cause of back pain is rarely dangerous and the pain often gets better over time.The cause of your back pain may not be known. Some common causes of back pain include:  Strain of the muscles or ligaments supporting the spine.  Wear and tear (degeneration) of the spinal disks.  Arthritis.  Direct injury to the back. For many people, back pain may return. Since back pain is rarely dangerous, most people can learn to manage this condition on their own. HOME CARE INSTRUCTIONS Watch your back pain for any changes. The following actions may help to lessen any discomfort you are feeling:  Remain active. It is stressful on your back to sit or stand in one place for long periods of time. Do not sit, drive, or stand in one place for more than 30 minutes at a time. Take short walks on even surfaces as soon as you are able.Try to increase the length of time you walk each day.  Exercise regularly as directed by your health care provider. Exercise helps your back heal faster. It also helps avoid future injury by keeping your muscles strong and flexible.  Do not stay in bed.Resting more than 1-2 days can delay your recovery.  Pay attention to your body when you bend and lift. The most comfortable positions are those that put less stress on your recovering back. Always use proper lifting techniques, including:  Bending your knees.  Keeping the load close to your body.  Avoiding twisting.  Find a comfortable position to sleep. Use a firm mattress and lie on your side with your knees slightly bent. If you lie on your back, put a pillow under your knees.  Avoid feeling anxious or stressed.Stress increases muscle tension and can worsen back pain.It is important  to recognize when you are anxious or stressed and learn ways to manage it, such as with exercise.  Take medicines only as directed by your health care provider. Over-the-counter medicines to reduce pain and inflammation are often the most helpful.Your health care provider may prescribe muscle relaxant drugs.These medicines help dull your pain so you can more quickly return to your normal activities and healthy exercise.  Apply ice to the injured area:  Put ice in a plastic bag.  Place a towel between your skin and the bag.  Leave the ice on for 20 minutes, 2-3 times a day for the first 2-3 days. After that, ice and heat may be alternated to reduce pain and spasms.  Maintain a healthy weight. Excess weight puts extra stress on your back and makes it difficult to maintain good posture. SEEK MEDICAL CARE IF:  You have pain that is not relieved with rest or medicine.  You have increasing pain going down into the legs or buttocks.  You have pain that does not improve in one week.  You have night pain.  You lose weight.  You have a fever or chills. SEEK IMMEDIATE MEDICAL CARE IF:   You develop new bowel or bladder control problems.  You have unusual weakness or numbness in your arms or legs.  You develop nausea or vomiting.  You develop abdominal pain.  You feel faint.   This information is not intended to replace advice given to  you by your health care provider. Make sure you discuss any questions you have with your health care provider.   Document Released: 06/02/2005 Document Revised: 06/23/2014 Document Reviewed: 10/04/2013 Elsevier Interactive Patient Education [employer].

## 2016-01-04 NOTE — ED Provider Notes (Signed)
CSN: [id number]     Arrival date & time 01/04/16  0405 History   First MD Initiated Contact with Patient 01/04/16 0425     Chief Complaint  Patient presents with  . Flank Pain     (Consider location/radiation/quality/duration/timing/severity/associated sxs/prior Treatment) Patient is a 45 y.o. female presenting with flank pain. The history is provided by the patient.  Flank Pain This is a new problem. The current episode started more than 2 days ago. The problem occurs constantly. The problem has not changed since onset.Pertinent negatives include no chest pain, no headaches and no shortness of breath. The symptoms are aggravated by bending and twisting. Nothing relieves the symptoms. She has tried nothing for the symptoms. The treatment provided no relief.   45 yo F With a chief complaint of bilateral lower back pain. This been going on for at least 3-4 days. Patient denies any injury is worse with movement palpation twisting. She has had some increased urinary frequency and some hesitancy. She thinks she likely has a kidney infection and she is worried that her kidney function will be decreased if she does not get this checked out immediately. She has any fevers or chills.  Past Medical History  Diagnosis Date  . Migraine   . GERD (gastroesophageal reflux disease)   . Ulcer   . Chronic pain   . Sickle cell trait (HCC)   . Asthma   . Anemia    Past Surgical History  Procedure Laterality Date  . Myringotomy      age 26  . Esophagogastroduodenoscopy (egd) with propofol N/A 04/04/2013    Procedure: ESOPHAGOGASTRODUODENOSCOPY (EGD) WITH PROPOFOL;  Surgeon: Willis Modena, MD;  Location: [facility];  Service: Endoscopy;  Laterality: N/A;  . Examination under anesthesia N/A 08/10/2013    Procedure: RECTAL EXAM UNDER ANESTHESIA;  Surgeon: Clovis Pu. Cornett, MD;  Location: [facility];  Service: General;  Laterality: N/A;  ligation  . Hemorrhoid surgery     Family History   Problem Relation Age of Onset  . Heart disease Mother     Arrythmia  . Kidney disease Mother   . Hypertension Mother   . COPD Mother   . Diabetes Mother   . Heart disease Father   . Crohn's disease Maternal Aunt    Social History  Substance Use Topics  . Smoking status: Current Some Day Smoker -- 0.25 packs/day for 15 years    Types: Cigarettes  . Smokeless tobacco: Never Used  . Alcohol Use: Yes     Comment: very rare   OB History    No data available     Review of Systems  Constitutional: Negative for fever and chills.  HENT: Negative for congestion and rhinorrhea.   Eyes: Negative for redness and visual disturbance.  Respiratory: Negative for shortness of breath and wheezing.   Cardiovascular: Negative for chest pain and palpitations.  Gastrointestinal: Negative for nausea and vomiting.  Genitourinary: Positive for flank pain. Negative for dysuria and urgency.  Musculoskeletal: Positive for back pain. Negative for myalgias and arthralgias.  Skin: Negative for pallor and wound.  Neurological: Negative for dizziness and headaches.      Allergies  Hydrocodone-acetaminophen  Home Medications   Prior to Admission medications   Medication Sig Start Date End Date Taking? Authorizing Provider  esomeprazole (NEXIUM) 10 MG packet Take 10 mg by mouth daily as needed (acid reflux).    Yes Historical Provider, MD  SUMAtriptan (IMITREX) 6 MG/0.5ML SOLN injection Inject 6 mg into  the skin every 2 (two) hours as needed for migraine or headache (migraine). May repeat in 2 hours if headache persists or recurs.   Yes Historical Provider, MD  azithromycin (ZITHROMAX) 250 MG tablet Take 1 tablet (250 mg total) by mouth daily. Take first 2 tablets together, then 1 every day until finished. Patient not taking: Reported on 11/12/2015 04/16/15   Cheri FowlerKayla Rose, PA-C  benzonatate (TESSALON) 100 MG capsule Take 1 capsule (100 mg total) by mouth 3 (three) times daily as needed for cough. Patient  not taking: Reported on 11/12/2015 04/16/15   Cheri FowlerKayla Rose, PA-C  benzonatate (TESSALON) 100 MG capsule Take 1 capsule (100 mg total) by mouth every 8 (eight) hours. Patient not taking: Reported on 11/12/2015 08/31/15   Ace GinsSerena Y Sam, PA-C  fluticasone [facility](FLONASE) 50 MCG/ACT nasal spray Place 2 sprays into both nostrils daily. Patient not taking: Reported on 11/12/2015 03/29/15   Garlon HatchetLisa M Sanders, PA-C  fluticasone [facility](FLONASE) 50 MCG/ACT nasal spray Place 2 sprays into both nostrils daily. Patient not taking: Reported on 11/12/2015 08/31/15   Ace GinsSerena Y Sam, PA-C  guaiFENesin (MUCINEX) 600 MG 12 hr tablet Take 1 tablet (600 mg total) by mouth 2 (two) times daily as needed for to loosen phlegm. Patient not taking: Reported on 11/12/2015 08/31/15   Ace GinsSerena Y Sam, PA-C  ibuprofen (ADVIL,MOTRIN) 800 MG tablet Take 1 tablet (800 mg total) by mouth 3 (three) times daily. Patient not taking: Reported on 11/12/2015 08/31/15   Ace GinsSerena Y Sam, PA-C  pantoprazole (PROTONIX) 20 MG tablet Take 1 tablet (20 mg total) by mouth daily. Patient not taking: Reported on 01/04/2016 11/16/15   Rolm GalaStevi Barrett, PA-C  sucralfate (CARAFATE) 1 GM/10ML suspension Take 10 mLs (1 g total) by mouth 4 (four) times daily -  with meals and at bedtime. Patient not taking: Reported on 01/04/2016 11/16/15   Stevi Barrett, PA-C   BP 117/69 mmHg  Pulse 103  Temp(Src) 97.7 F (36.5 C) (Oral)  Resp 18  Ht 5\' 4"  (1.626 m)  Wt 129 lb (58.514 kg)  BMI 22.13 kg/m2  SpO2 100%  LMP 12/19/2015 Physical Exam  Constitutional: She is oriented to person, place, and time. She appears well-developed and well-nourished. No distress.  HENT:  Head: Normocephalic and atraumatic.  Eyes: EOM are normal. Pupils are equal, round, and reactive to light.  Neck: Normal range of motion. Neck supple.  Cardiovascular: Normal rate and regular rhythm.  Exam reveals no gallop and no friction rub.   No murmur heard. Pulmonary/Chest: Effort normal. She has no wheezes. She has no  rales.  Abdominal: Soft. She exhibits no distension. There is no tenderness. There is no rebound and no guarding.  Musculoskeletal: She exhibits tenderness (mild bilateral lower back pain). She exhibits no edema.  Neurological: She is alert and oriented to person, place, and time.  Skin: Skin is warm and dry. She is not diaphoretic.  Psychiatric: She has a normal mood and affect. Her behavior is normal.  Nursing note and vitals reviewed.   ED Course  Procedures (including critical care time) Labs Review Labs Reviewed  CBC WITH DIFFERENTIAL/PLATELET - Abnormal; Notable for the following:    RBC 3.70 (*)    Hemoglobin 7.7 (*)    HCT 24.4 (*)    MCV 65.9 (*)    MCH 20.8 (*)    RDW 21.3 (*)    Platelets 564 (*)    All other components within normal limits  COMPREHENSIVE METABOLIC PANEL - Abnormal; Notable for the following:  Potassium 3.0 (*)    Glucose, Bld 109 (*)    ALT 11 (*)    All other components within normal limits  URINALYSIS, ROUTINE W REFLEX MICROSCOPIC (NOT AT [facility]) - Abnormal; Notable for the following:    APPearance CLOUDY (*)    All other components within normal limits  LIPASE, BLOOD  POC URINE PREG, ED    Imaging Review No results found. I have personally reviewed and evaluated these images and lab results as part of my medical decision-making.   EKG Interpretation None      MDM   Final diagnoses:  Bilateral low back pain without sciatica    45 yo F with a chief complaint of bilateral lower back pain. This is worse with palpation twisting in certain positions. UA is negative. She denies any vaginal symptoms. Labs are otherwise reassuring. I suspect this is muscle skeletal low back pain. PCP follow-up. Trial of NSAIDs.  5:46 AM:  I have discussed the diagnosis/risks/treatment options with the patient and believe the pt to be eligible for discharge home to follow-up with PCP. We also discussed returning to the ED immediately if new or worsening sx  occur. We discussed the sx which are most concerning (e.g., sudden worsening pain, fever, inability to tolerate by mouth) that necessitate immediate return. Medications administered to the patient during their visit and any new prescriptions provided to the patient are listed below.  Medications given during this visit Medications - No data to display  New Prescriptions   No medications on file    The patient appears reasonably screen and/or stabilized for discharge and I doubt any other medical condition or other [facility] requiring further screening, evaluation, or treatment in the ED at this time prior to discharge.    Melene Plan, DO 01/04/16 [phone]

## 2016-01-04 NOTE — ED Notes (Signed)
Pt states that she has been tired and has been sleeping more the last few days; pt states that she is having bilateral flank pain and urinary frequency; pt states "I think I have a kidney infection and I think my blood is low"; pt denies any bleeding; pt states that she has a hx of anemia

## 2016-01-05 ENCOUNTER — Emergency Department (HOSPITAL_BASED_OUTPATIENT_CLINIC_OR_DEPARTMENT_OTHER)
Admission: EM | Admit: 2016-01-05 | Discharge: 2016-01-06 | Disposition: A | Discharge status: Home / Self Care | Payer: Managed Care, Other (non HMO) | Attending: Emergency Medicine | Admitting: Emergency Medicine

## 2016-01-05 ENCOUNTER — Encounter (HOSPITAL_BASED_OUTPATIENT_CLINIC_OR_DEPARTMENT_OTHER): Payer: Self-pay | Admitting: Emergency Medicine

## 2016-01-05 DIAGNOSIS — X500XXA Overexertion from strenuous movement or load, initial encounter: Secondary | ICD-10-CM | POA: Insufficient documentation

## 2016-01-05 DIAGNOSIS — Y999 Unspecified external cause status: Secondary | ICD-10-CM | POA: Insufficient documentation

## 2016-01-05 DIAGNOSIS — Y929 Unspecified place or not applicable: Secondary | ICD-10-CM | POA: Insufficient documentation

## 2016-01-05 DIAGNOSIS — S39012A Strain of muscle, fascia and tendon of lower back, initial encounter: Secondary | ICD-10-CM

## 2016-01-05 DIAGNOSIS — J45909 Unspecified asthma, uncomplicated: Secondary | ICD-10-CM | POA: Insufficient documentation

## 2016-01-05 DIAGNOSIS — Y939 Activity, unspecified: Secondary | ICD-10-CM | POA: Insufficient documentation

## 2016-01-05 DIAGNOSIS — F17210 Nicotine dependence, cigarettes, uncomplicated: Secondary | ICD-10-CM | POA: Insufficient documentation

## 2016-01-05 MED ORDER — NAPROXEN 500 MG PO TABS: 500.0000 mg | ORAL_TABLET | Freq: Two times a day (BID) | ORAL | Status: DC

## 2016-01-05 MED ORDER — CYCLOBENZAPRINE HCL 10 MG PO TABS: 10.0000 mg | ORAL_TABLET | Freq: Three times a day (TID) | ORAL | Status: DC | PRN

## 2016-01-05 NOTE — ED Provider Notes (Signed)
CSN: [id number]     Arrival date & time 01/05/16  2107 History  By signing my name below, I, Jenny Evans, attest that this documentation has been prepared under the direction and in the presence of Geoffery Lyons, MD. Electronically Signed: Evon Slack, ED Scribe. 01/05/2016. 11:49 PM.     Chief Complaint  Patient presents with  . Back Pain   Patient is a 45 y.o. female presenting with back pain. The history is provided by the patient. No language interpreter was used.  Back Pain Associated symptoms: no fever    HPI Comments: Jenny Evans is a 45 y.o. female who presents to the Emergency Department complaining of low back pain onset 3 days prior. Pt doesn't reports any associated symptoms.Pt reports that she was doing a lot heavy lifting recently. Pt states she has tried tylenol, motrin and epsom salt with no relief. She states that the pain is worse with certain movement. Pt denies fever or incontinence. Pt denies fall or injury.   Past Medical History  Diagnosis Date  . Migraine   . GERD (gastroesophageal reflux disease)   . Ulcer   . Chronic pain   . Sickle cell trait (HCC)   . Asthma   . Anemia    Past Surgical History  Procedure Laterality Date  . Myringotomy      age 36  . Esophagogastroduodenoscopy (egd) with propofol N/A 04/04/2013    Procedure: ESOPHAGOGASTRODUODENOSCOPY (EGD) WITH PROPOFOL;  Surgeon: Willis Modena, MD;  Location: [facility];  Service: Endoscopy;  Laterality: N/A;  . Examination under anesthesia N/A 08/10/2013    Procedure: RECTAL EXAM UNDER ANESTHESIA;  Surgeon: Clovis Pu. Cornett, MD;  Location: [facility];  Service: General;  Laterality: N/A;  ligation  . Hemorrhoid surgery     Family History  Problem Relation Age of Onset  . Heart disease Mother     Arrythmia  . Kidney disease Mother   . Hypertension Mother   . COPD Mother   . Diabetes Mother   . Heart disease Father   . Crohn's disease Maternal Aunt    Social  History  Substance Use Topics  . Smoking status: Current Some Day Smoker -- 0.25 packs/day for 15 years    Types: Cigarettes  . Smokeless tobacco: Never Used  . Alcohol Use: Yes     Comment: very rare   OB History    No data available      Review of Systems  Constitutional: Negative for fever.  Genitourinary: Negative.   Musculoskeletal: Positive for back pain.  All other systems reviewed and are negative.     Allergies  Hydrocodone-acetaminophen  Home Medications   Prior to Admission medications   Medication Sig Start Date End Date Taking? Authorizing Provider  azithromycin (ZITHROMAX) 250 MG tablet Take 1 tablet (250 mg total) by mouth daily. Take first 2 tablets together, then 1 every day until finished. Patient not taking: Reported on 11/12/2015 04/16/15   Cheri Fowler, PA-C  benzonatate (TESSALON) 100 MG capsule Take 1 capsule (100 mg total) by mouth 3 (three) times daily as needed for cough. Patient not taking: Reported on 11/12/2015 04/16/15   Cheri Fowler, PA-C  benzonatate (TESSALON) 100 MG capsule Take 1 capsule (100 mg total) by mouth every 8 (eight) hours. Patient not taking: Reported on 11/12/2015 08/31/15   Ace Gins Sam, PA-C  esomeprazole (NEXIUM) 10 MG packet Take 10 mg by mouth daily as needed (acid reflux).     Historical Provider, MD  fluticasone (FLONASE) 50 MCG/ACT nasal spray Place 2 sprays into both nostrils daily. Patient not taking: Reported on 11/12/2015 03/29/15   Garlon Hatchet, PA-C  fluticasone [facility]) 50 MCG/ACT nasal spray Place 2 sprays into both nostrils daily. Patient not taking: Reported on 11/12/2015 08/31/15   Ace Gins Sam, PA-C  guaiFENesin (MUCINEX) 600 MG 12 hr tablet Take 1 tablet (600 mg total) by mouth 2 (two) times daily as needed for to loosen phlegm. Patient not taking: Reported on 11/12/2015 08/31/15   Ace Gins Sam, PA-C  ibuprofen (ADVIL,MOTRIN) 800 MG tablet Take 1 tablet (800 mg total) by mouth 3 (three) times daily. Patient not  taking: Reported on 11/12/2015 08/31/15   Ace Gins Sam, PA-C  pantoprazole (PROTONIX) 20 MG tablet Take 1 tablet (20 mg total) by mouth daily. Patient not taking: Reported on 01/04/2016 11/16/15   Rolm Gala Barrett, PA-C  sucralfate (CARAFATE) 1 GM/10ML suspension Take 10 mLs (1 g total) by mouth 4 (four) times daily -  with meals and at bedtime. Patient not taking: Reported on 01/04/2016 11/16/15   Rolm Gala Barrett, PA-C  SUMAtriptan (IMITREX) 6 MG/0.5ML SOLN injection Inject 6 mg into the skin every 2 (two) hours as needed for migraine or headache (migraine). May repeat in 2 hours if headache persists or recurs.    Historical Provider, MD   BP 92/52 mmHg  Pulse 96  Temp(Src) 98.7 F (37.1 C) (Oral)  Resp 18  Ht  (1.626 m)  Wt 130 lb (58.968 kg)  BMI 22.30 kg/m2  SpO2 100%  LMP 12/19/2015    Physical Exam  Constitutional: She is oriented to person, place, and time. She appears well-developed and well-nourished. No distress.  HENT:  Head: Normocephalic and atraumatic.  Eyes: Conjunctivae and EOM are normal.  Neck: Neck supple. No tracheal deviation present.  Cardiovascular: Normal rate.   Pulmonary/Chest: Effort normal. No respiratory distress.  Musculoskeletal: Normal range of motion.  Neurological: She is alert and oriented to person, place, and time. She has normal reflexes.  Skin: Skin is warm and dry.  Psychiatric: She has a normal mood and affect. Her behavior is normal.  Nursing note and vitals reviewed.   ED Course  Procedures (including critical care time) DIAGNOSTIC STUDIES: Oxygen Saturation is 100% on RA, normal by my interpretation.    COORDINATION OF CARE: 11:53 PM-Discussed treatment plan which includes muscle relaxant and muscle relaxers with pt at bedside and pt agreed to plan.     Labs Review Labs Reviewed - No data to display  Imaging Review No results found.    EKG Interpretation None      MDM   Final diagnoses:  None   Patient presents with  complaints of low back pain that started after lifting heavy objects. She has no strength deficits and no bowel or bladder complaints. She has no red flags that would suggest an emergent etiology. She is ambulatory without difficulty. She will be treated with NSAIDs, muscle relaxers, and when necessary follow-up with her primary Dr. if not improving in the next week.   I personally performed the services described in this documentation, which was scribed in my presence. The recorded information has been reviewed and is accurate.         Geoffery Lyons, MD 01/06/16 [phone]

## 2016-01-05 NOTE — ED Notes (Signed)
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 C/O lumbar pain. Denies numbness/tingling to ext. Tried OTC meds without relief. Pain worse today.

## 2016-01-05 NOTE — Discharge Instructions (Signed)
Naproxen as prescribed.  Flexeril as prescribed as needed for pain.  Follow-up with your primary Dr. if not improving in the next week.   Lumbosacral Strain Lumbosacral strain is a strain of any of the parts that make up your lumbosacral vertebrae. Your lumbosacral vertebrae are the bones that make up the lower third of your backbone. Your lumbosacral vertebrae are held together by muscles and tough, fibrous tissue (ligaments).  CAUSES  A sudden blow to your back can cause lumbosacral strain. Also, anything that causes an excessive stretch of the muscles in the low back can cause this strain. This is typically seen when people exert themselves strenuously, fall, lift heavy objects, bend, or crouch repeatedly. RISK FACTORS  Physically demanding work.  Participation in pushing or pulling sports or sports that require a sudden twist of the back (tennis, golf, baseball).  Weight lifting.  Excessive lower back curvature.  Forward-tilted pelvis.  Weak back or abdominal muscles or both.  Tight hamstrings. SIGNS AND SYMPTOMS  Lumbosacral strain may cause pain in the area of your injury or pain that moves (radiates) down your leg.  DIAGNOSIS Your health care provider can often diagnose lumbosacral strain through a physical exam. In some cases, you may need tests such as X-ray exams.  TREATMENT  Treatment for your lower back injury depends on many factors that your clinician will have to evaluate. However, most treatment will include the use of anti-inflammatory medicines. HOME CARE INSTRUCTIONS   Avoid hard physical activities (tennis, racquetball, waterskiing) if you are not in proper physical condition for it. This may aggravate or create problems.  If you have a back problem, avoid sports requiring sudden body movements. Swimming and walking are generally safer activities.  Maintain good posture.  Maintain a healthy weight.  For acute conditions, you may put ice on the injured  area.  Put ice in a plastic bag.  Place a towel between your skin and the bag.  Leave the ice on for 20 minutes, 2-3 times a day.  When the low back starts healing, stretching and strengthening exercises may be recommended. SEEK MEDICAL CARE IF:  Your back pain is getting worse.  You experience severe back pain not relieved with medicines. SEEK IMMEDIATE MEDICAL CARE IF:   You have numbness, tingling, weakness, or problems with the use of your arms or legs.  There is a change in bowel or bladder control.  You have increasing pain in any area of the body, including your belly (abdomen).  You notice shortness of breath, dizziness, or feel faint.  You feel sick to your stomach (nauseous), are throwing up (vomiting), or become sweaty.  You notice discoloration of your toes or legs, or your feet get very cold. MAKE SURE YOU:   Understand these instructions.  Will watch your condition.  Will get help right away if you are not doing well or get worse.   This information is not intended to replace advice given to you by your health care provider. Make sure you discuss any questions you have with your health care provider.   Document Released: 03/12/2005 Document Revised: 06/23/2014 Document Reviewed: 01/19/2013 Elsevier Interactive Patient Education [employer]

## 2016-01-05 NOTE — ED Notes (Signed)
paient states that she went to the hospital last night and was told not to go to work because of on going back pain. The patient chose to go to work last night and the pain is now worse.

## 2016-03-03 ENCOUNTER — Emergency Department (HOSPITAL_COMMUNITY)
Admission: EM | Admit: 2016-03-03 | Discharge: 2016-03-03 | Disposition: A | Discharge status: Home / Self Care | Payer: Managed Care, Other (non HMO) | Attending: Emergency Medicine | Admitting: Emergency Medicine

## 2016-03-03 ENCOUNTER — Encounter (HOSPITAL_COMMUNITY): Payer: Self-pay

## 2016-03-03 DIAGNOSIS — Z79899 Other long term (current) drug therapy: Secondary | ICD-10-CM | POA: Insufficient documentation

## 2016-03-03 DIAGNOSIS — J069 Acute upper respiratory infection, unspecified: Secondary | ICD-10-CM

## 2016-03-03 DIAGNOSIS — F17210 Nicotine dependence, cigarettes, uncomplicated: Secondary | ICD-10-CM | POA: Insufficient documentation

## 2016-03-03 DIAGNOSIS — J45909 Unspecified asthma, uncomplicated: Secondary | ICD-10-CM | POA: Insufficient documentation

## 2016-03-03 LAB — I-STAT CHEM 8, ED
BUN: 4 mg/dL — ABNORMAL LOW (ref 6–20)
Calcium, Ion: 1.16 mmol/L (ref 1.15–1.40)
Chloride: 106 mmol/L (ref 101–111)
Creatinine, Ser: 0.5 mg/dL (ref 0.44–1.00)
Glucose, Bld: 95 mg/dL (ref 65–99)
HCT: 22 % — ABNORMAL LOW (ref 36.0–46.0)
Hemoglobin: 7.5 g/dL — ABNORMAL LOW (ref 12.0–15.0)
Potassium: 2.9 mmol/L — ABNORMAL LOW (ref 3.5–5.1)
Sodium: 140 mmol/L (ref 135–145)
TCO2: 23 mmol/L (ref 0–100)

## 2016-03-03 MED ORDER — POTASSIUM CHLORIDE CRYS ER 20 MEQ PO TBCR
60.0000 meq | EXTENDED_RELEASE_TABLET | Freq: Once | ORAL | Status: AC
  Administered 2016-03-03: 60 meq via ORAL
  Filled 2016-03-03: qty 3

## 2016-03-03 MED ORDER — DM-GUAIFENESIN ER 30-600 MG PO TB12: 1.0000 | ORAL_TABLET | Freq: Two times a day (BID) | ORAL | 0 refills | Status: DC

## 2016-03-03 MED ORDER — BENZONATATE 100 MG PO CAPS: 100.0000 mg | ORAL_CAPSULE | Freq: Three times a day (TID) | ORAL | 0 refills | Status: DC

## 2016-03-03 NOTE — ED Provider Notes (Signed)
[facility]-EMERGENCY DEPT Provider Note   CSN: [id number] Arrival date & time: 03/03/16  0325     History   Chief Complaint Chief Complaint  Patient presents with  . URI    HPI Jenny Evans is a 45 y.o. female with a past medical history of anemia who presents to the ED today complaining of cough, nasal congestion. Patient states that 3 days ago she began having sinus pressure and nasal congestion. Patient also has a scratchy throat and dry cough and states that she is unable to sleep through the night due to consistent coughing. Patient is unsure if she has been running fevers but states that she has been feeling very cold. She states she typically feels this way when her "blood counts get low". Patient states that she received a blood transfusion 2 months ago for anemia. Patient states she's been taking TheraFlu and over-the-counter cough and cold medication without relief of her symptoms. She denies any otalgia, vomiting, diarrhea.No sick contacts.  HPI  Past Medical History:  Diagnosis Date  . Anemia   . Asthma   . Chronic pain   . GERD (gastroesophageal reflux disease)   . Migraine   . Sickle cell trait (HCC)   . Ulcer     Patient Active Problem List   Diagnosis Date Noted  . Blood loss anemia 11/12/2015  . Melena 11/12/2015  . Nonspecific abnormal electrocardiogram (ECG) (EKG) 04/03/2015  . Palpitations 04/03/2015  . Neck pain 04/03/2015  . Colitis 07/10/2014  . Absolute anemia   . Post-operative state 08/26/2013  . Rectocele 08/26/2013  . Anal or rectal pain 07/29/2013  . Headache(784.0) 02/27/2012  . ACUTE BRONCHITIS 03/25/2010  . OBESITY 09/06/2009  . INSOMNIA 01/23/2009  . HYPOKALEMIA 11/01/2008  . DYSPEPSIA&OTHER [facility] DISORDERS FUNCTION STOMACH 06/07/2008  . ALLERGIC RHINITIS 03/23/2008  . CONSTIPATION 01/20/2008  . SHORTNESS OF BREATH 08/27/2007  . TOBACCO ABUSE 03/09/2007  . Migraine 03/09/2007  . ABSCESS, TOOTH 03/09/2007  . GERD (gastroesophageal  reflux disease) 03/09/2007  . HEADACHE, CLUSTER 02/25/2007  . ECZEMA 02/25/2007    Past Surgical History:  Procedure Laterality Date  . ESOPHAGOGASTRODUODENOSCOPY (EGD) WITH PROPOFOL N/A 04/04/2013   Procedure: ESOPHAGOGASTRODUODENOSCOPY (EGD) WITH PROPOFOL;  Surgeon: Willis Modena, MD;  Location: [facility];  Service: Endoscopy;  Laterality: N/A;  . EXAMINATION UNDER ANESTHESIA N/A 08/10/2013   Procedure: RECTAL EXAM UNDER ANESTHESIA;  Surgeon: Clovis Pu. Cornett, MD;  Location:  [facility];  Service: General;  Laterality: N/A;  ligation  . HEMORRHOID SURGERY    . MYRINGOTOMY     age 49    OB History    No data available       Home Medications    Prior to Admission medications   Medication Sig Start Date End Date Taking? Authorizing Provider  Esomeprazole Magnesium (NEXIUM 24HR) 20 MG TBEC Take 20 mg by mouth daily as needed (for heartburn.).   Yes Historical Provider, MD  cyclobenzaprine (FLEXERIL) 10 MG tablet Take 1 tablet (10 mg total) by mouth 3 (three) times daily as needed for muscle spasms. Patient not taking: Reported on 03/03/2016 01/05/16   Geoffery Lyons, MD  fluticasone [facility]) 50 MCG/ACT nasal spray Place 2 sprays into both nostrils daily. Patient not taking: Reported on 11/12/2015 03/29/15   Garlon Hatchet, PA-C  naproxen (NAPROSYN) 500 MG tablet Take 1 tablet (500 mg total) by mouth 2 (two) times daily. Patient not taking: Reported on 03/03/2016 01/05/16   Geoffery Lyons, MD  pantoprazole (PROTONIX) 20 MG  tablet Take 1 tablet (20 mg total) by mouth daily. Patient not taking: Reported on 01/04/2016 11/16/15   Rolm Gala Barrett, PA-C  sucralfate (CARAFATE) 1 GM/10ML suspension Take 10 mLs (1 g total) by mouth 4 (four) times daily -  with meals and at bedtime. Patient not taking: Reported on 01/04/2016 11/16/15   Alveta Heimlich, PA-C    Family History Family History  Problem Relation Age of Onset  . Heart disease Mother     Arrythmia  . Kidney disease Mother     . Hypertension Mother   . COPD Mother   . Diabetes Mother   . Heart disease Father   . Crohn's disease Maternal Aunt     Social History Social History  Substance Use Topics  . Smoking status: Current Some Day Smoker    Packs/day: 0.25    Years: 15.00    Types: Cigarettes  . Smokeless tobacco: Never Used  . Alcohol use Yes     Comment: very rare     Allergies   Hydrocodone-acetaminophen   Review of Systems Review of Systems  All other systems reviewed and are negative.    Physical Exam Updated Vital Signs BP 117/85 (BP Location: Left Arm)   Pulse 98   Temp 98.3 F (36.8 C) (Oral)   Resp 18   Ht 5\' 4"  (1.626 m)   Wt 59 kg   LMP 02/15/2016   SpO2 100%   BMI 22.31 kg/m   Physical Exam  Constitutional: She is oriented to person, place, and time. She appears well-developed and well-nourished. No distress.  HENT:  Head: Normocephalic and atraumatic.  Mouth/Throat: Oropharynx is clear and moist. No oropharyngeal exudate.  Clear nasal discharge. TMs clear bilaterally.  Eyes: Conjunctivae and EOM are normal. Pupils are equal, round, and reactive to light. Right eye exhibits no discharge. Left eye exhibits no discharge. No scleral icterus.  Neck: Normal range of motion. Neck supple.  Cardiovascular: Normal rate, regular rhythm, normal heart sounds and intact distal pulses.  Exam reveals no gallop and no friction rub.   No murmur heard. Pulmonary/Chest: Effort normal and breath sounds normal. No respiratory distress. She has no wheezes. She has no rales. She exhibits no tenderness.  Abdominal: Soft. She exhibits no distension. There is no tenderness. There is no guarding.  Musculoskeletal: Normal range of motion. She exhibits no edema.  Lymphadenopathy:    She has no cervical adenopathy.  Neurological: She is alert and oriented to person, place, and time.  Skin: Skin is warm and dry. No rash noted. She is not diaphoretic. No erythema. No pallor.  Psychiatric: She  has a normal mood and affect. Her behavior is normal.  Nursing note and vitals reviewed.    ED Treatments / Results  Labs (all labs ordered are listed, but only abnormal results are displayed) Labs Reviewed  I-STAT CHEM 8, ED    EKG  EKG Interpretation None       Radiology No results found.  Procedures Procedures (including critical care time)  Medications Ordered in ED Medications - No data to display   Initial Impression / Assessment and Plan / ED Course  I have reviewed the triage vital signs and the nursing notes.  Pertinent labs & imaging results that were available during my care of the patient were reviewed by me and considered in my medical decision making (see chart for details).  Clinical Course   45 y.o F with a pmhx of anemia presents to the ED today With URI  symptoms onset 2 days ago. Patient is experiencing rhinorrhea, sinus pressure, dry cough. Patient appears overall well in ED, nontoxic and nonseptic appearing. All vital signs are stable. Patient does have history of anemia with recent blood transfusion and states she feels "cold". Hemoglobin stable today. This is low but it appears to be patient's baseline. No indication for blood transfusion at this time. Patient is also mildly hypokalemic, repleted in ED. Otherwise patient's symptoms are likely viral in etiology. Lungs clear to auscultation bilaterally. Do not think chest x-ray is indicated at this time. We'll treat with symptomatic therapy. Follow-up with PCP. Return precautions outlined in patient discharge instructions.   Final Clinical Impressions(s) / ED Diagnoses   Final diagnoses:  URI (upper respiratory infection)    New Prescriptions New Prescriptions   No medications on file     Dub MikesSamantha Tripp Timmy Bubeck, PA-C 03/03/16 1724    Devoria AlbeIva Knapp, MD 03/05/16 2306

## 2016-03-03 NOTE — Discharge Instructions (Signed)
Encourage nasal rinsing. Recommend OTC mucinex for nasal decongestant. Take Tessalon for cough. Encourage adequate hydration, drink plenty of fluids. Follow up with PCP if symptoms do not improve. Return to the ED if you experience severe worsening of your symptoms, fevers, difficulty breathing.

## 2016-03-03 NOTE — ED Triage Notes (Signed)
Patient c/o cold symptoms that began x2 days ago.  Patient states that has had a productive cough with yellow/green sputum.  Patient breathing even and unlabored in triage.  NAD at this time.

## 2016-03-08 ENCOUNTER — Emergency Department (HOSPITAL_BASED_OUTPATIENT_CLINIC_OR_DEPARTMENT_OTHER)
Admission: EM | Admit: 2016-03-08 | Discharge: 2016-03-08 | Disposition: A | Discharge status: Home / Self Care | Payer: Managed Care, Other (non HMO) | Attending: Emergency Medicine | Admitting: Emergency Medicine

## 2016-03-08 ENCOUNTER — Encounter (HOSPITAL_BASED_OUTPATIENT_CLINIC_OR_DEPARTMENT_OTHER): Payer: Self-pay | Admitting: Emergency Medicine

## 2016-03-08 DIAGNOSIS — J45909 Unspecified asthma, uncomplicated: Secondary | ICD-10-CM | POA: Insufficient documentation

## 2016-03-08 DIAGNOSIS — F17210 Nicotine dependence, cigarettes, uncomplicated: Secondary | ICD-10-CM | POA: Insufficient documentation

## 2016-03-08 DIAGNOSIS — J069 Acute upper respiratory infection, unspecified: Secondary | ICD-10-CM | POA: Insufficient documentation

## 2016-03-08 DIAGNOSIS — R0981 Nasal congestion: Secondary | ICD-10-CM

## 2016-03-08 NOTE — ED Notes (Signed)
Pt states she was seen in the hospital last week for nasal congestion and face pain, mucinex was given and she took it like prescribed and it made symptoms worse.

## 2016-03-08 NOTE — ED Provider Notes (Signed)
[facility] Provider Note   CSN: [id number] Arrival date & time: 03/08/16  2055  By signing my name below, I, Jenny Evans, attest that this documentation has been prepared under the direction and in the presence of Jenny Evans Currington, MD.  Electronically Signed: Octavia HeirArianna Evans, ED Scribe. 03/08/16. 11:06 PM.    History   Chief Complaint Chief Complaint  Patient presents with  . Nasal Congestion  . Facial Pain    The history is provided by the patient. No language interpreter was used.   HPI Comments: Jenny Evans is a 45 y.o. female who presents to the Emergency Department complaining of sudden onset, gradual worsening moderate nasal congestion onset one week ago. Pt has been having associated dry cough, post nasal drip, sinus headache, and sinus pressure. She notes that her cough is worse when she lays down. Pt was evaluated last week and was given mucinex and tessalon pearls to alleviate her symptoms with no relief. She notes working in air for long periods of time at her job. She denies fever, nausea, vomiting, chest pain, shortness of breath.  Past Medical History:  Diagnosis Date  . Anemia   . Asthma   . Chronic pain   . GERD (gastroesophageal reflux disease)   . Migraine   . Sickle cell trait (HCC)   . Ulcer     Patient Active Problem List   Diagnosis Date Noted  . Blood loss anemia 11/12/2015  . Melena 11/12/2015  . Nonspecific abnormal electrocardiogram (ECG) (EKG) 04/03/2015  . Palpitations 04/03/2015  . Neck pain 04/03/2015  . Colitis 07/10/2014  . Absolute anemia   . Post-operative state 08/26/2013  . Rectocele 08/26/2013  . Anal or rectal pain 07/29/2013  . Headache(784.0) 02/27/2012  . ACUTE BRONCHITIS 03/25/2010  . OBESITY 09/06/2009  . INSOMNIA 01/23/2009  . HYPOKALEMIA 11/01/2008  . D[facility]PSIA&OTHER [facility] DISORD[facility] FUNCTION STOMACH 06/07/2008  . ALLERGIC RHINITIS 03/23/2008  . CONSTIPATION 01/20/2008  . SHORTNESS OF BREATH  08/27/2007  . TOBACCO ABUSE 03/09/2007  . Migraine 03/09/2007  . ABSCESS, TOOTH 03/09/2007  . GERD (gastroesophageal reflux disease) 03/09/2007  . HEADACHE, CLUSTER 02/25/2007  . ECZEMA 02/25/2007    Past Surgical History:  Procedure Laterality Date  . ESOPHAGOGASTRODUODENOSCOPY (EGD) WITH PROPOFOL N/A 04/04/2013   Procedure: ESOPHAGOGASTRODUODENOSCOPY (EGD) WITH PROPOFOL;  Surgeon: Willis ModenaWilliam Outlaw, MD;  Location: [facility];  Service: Endoscopy;  Laterality: N/A;  . EXAMINATION UNDER ANESTHESIA N/A 08/10/2013   Procedure: RECTAL EXAM UNDER ANESTHESIA;  Surgeon: Clovis Puhomas A. Cornett, MD;  Location: [facility];  Service: General;  Laterality: N/A;  ligation  . HEMORRHOID SURGERY    . MYRINGOTOMY     age 45    OB History    No data available       Home Medications    Prior to Admission medications   Medication Sig Start Date End Date Taking? Authorizing Provider  benzonatate (TESSALON) 100 MG capsule Take 1 capsule (100 mg total) by mouth every 8 (eight) hours. 03/03/16   Samantha Tripp Dowless, PA-C  cyclobenzaprine (FLEXERIL) 10 MG tablet Take 1 tablet (10 mg total) by mouth 3 (three) times daily as needed for muscle spasms. Patient not taking: Reported on 03/03/2016 01/05/16   Geoffery Lyonsouglas Delo, MD  dextromethorphan-guaiFENesin [facility](MUCINEX DM) 30-600 MG 12hr tablet Take 1 tablet by mouth 2 (two) times daily. 03/03/16   Samantha Tripp Dowless, PA-C  Esomeprazole Magnesium (NEXIUM 24HR) 20 MG TBEC Take 20 mg by mouth daily as needed (for heartburn.).  Historical Provider, MD  fluticasone (FLONASE) 50 MCG/ACT nasal spray Place 2 sprays into both nostrils daily. Patient not taking: Reported on 11/12/2015 03/29/15   Garlon Hatchet, PA-C  naproxen (NAPROSYN) 500 MG tablet Take 1 tablet (500 mg total) by mouth 2 (two) times daily. Patient not taking: Reported on 03/03/2016 01/05/16   Geoffery Lyons, MD  pantoprazole (PROTONIX) 20 MG tablet Take 1 tablet (20 mg total) by mouth  daily. Patient not taking: Reported on 01/04/2016 11/16/15   Rolm Gala Barrett, PA-C  sucralfate (CARAFATE) 1 GM/10ML suspension Take 10 mLs (1 g total) by mouth 4 (four) times daily -  with meals and at bedtime. Patient not taking: Reported on 01/04/2016 11/16/15   Alveta Heimlich, PA-C    Family History Family History  Problem Relation Age of Onset  . Heart disease Mother     Arrythmia  . Kidney disease Mother   . Hypertension Mother   . COPD Mother   . Diabetes Mother   . Heart disease Father   . Crohn's disease Maternal Aunt     Social History Social History  Substance Use Topics  . Smoking status: Current Some Day Smoker    Packs/day: 0.25    Years: 15.00    Types: Cigarettes  . Smokeless tobacco: Never Used  . Alcohol use Yes     Comment: very rare     Allergies   Hydrocodone-acetaminophen   Review of Systems Review of Systems  A complete 10 system review of systems was obtained and all systems are negative except as noted in the HPI and PMH.   Physical Exam Updated Vital Signs BP 110/84   Pulse (!) 52 Comment: Irregular  Temp 98 F (36.7 C)   Resp 18   Ht 5\' 4"  (1.626 m)   Wt 130 lb (59 kg)   LMP 02/15/2016   SpO2 99%   BMI 22.31 kg/m   Physical Exam  Constitutional: She is oriented to person, place, and time. She appears well-developed and well-nourished. No distress.  HENT:  Head: Normocephalic and atraumatic.  Right Ear: External ear normal.  Left Ear: External ear normal.  Nose: Nose normal.  Edema to the nasal mucosa with R > L. Mild irritation to oropharynx.  Eyes: Conjunctivae and EOM are normal. No scleral icterus.  Neck: Normal range of motion and phonation normal.  No lymphadenopathy  Cardiovascular: Normal rate and regular rhythm.   Pulmonary/Chest: Effort normal. No stridor. No respiratory distress.  Abdominal: She exhibits no distension.  Musculoskeletal: Normal range of motion. She exhibits no edema.  Neurological: She is alert and  oriented to person, place, and time.  Skin: She is not diaphoretic.  Psychiatric: She has a normal mood and affect. Her behavior is normal.  Vitals reviewed.    ED Treatments / Results  DIAGNOSTIC STUDIES: Oxygen Saturation is 99% on RA, normal by my interpretation.  COORDINATION OF CARE:  10:58 PM Discussed treatment plan with pt at bedside and pt agreed to plan.  Labs (all labs ordered are listed, but only abnormal results are displayed) Labs Reviewed - No data to display  EKG  EKG Interpretation None       Radiology No results found.  Procedures Procedures (including critical care time)  Medications Ordered in ED Medications - No data to display   Initial Impression / Assessment and Plan / ED Course  I have reviewed the triage vital signs and the nursing notes.  Pertinent labs & imaging results that were available during  my care of the patient were reviewed by me and considered in my medical decision making (see chart for details).  Clinical Course    Likely viral sinusitis. No fevers. Patient has been trying symptomatic treatment with over-the-counter medicines with minimal improvement. Patient is afebrile and nontoxic appearing and well-hydrated. Given additional symptomatic treatment instructions. No indication for antibiotics at this time. Close PCP follow-up.   I personally performed the services described in this documentation, which was scribed in my presence. The recorded information has been reviewed and is accurate.   Final Clinical Impressions(s) / ED Diagnoses   Final diagnoses:  Nasal congestion  Upper respiratory infection   Disposition: Discharge  Condition: Good  I have discussed the results, Dx and Tx plan with the patient who expressed understanding and agree(s) with the plan. Discharge instructions discussed at great length. The patient was given strict return precautions who verbalized understanding of the instructions. No further  questions at time of discharge.    Discharge Medication List as of 03/08/2016 11:12 PM      Follow Up: Renaye Rakers, MD [address] [place] [address] [phone]  Schedule an appointment as soon as possible for a visit  in 3-5 days, If symptoms do not improve or  worsen.      Jenny Conn, MD 03/08/16 [phone]

## 2016-03-08 NOTE — ED Triage Notes (Signed)
Pt in c/o nasal congestion and sinus pain to face x over 1 week. Pt with dry cough. Alert, interactive, ambulatory in NAD.

## 2016-03-15 ENCOUNTER — Encounter (HOSPITAL_COMMUNITY): Payer: Self-pay | Admitting: Emergency Medicine

## 2016-03-15 ENCOUNTER — Emergency Department (HOSPITAL_COMMUNITY): Payer: Managed Care, Other (non HMO)

## 2016-03-15 ENCOUNTER — Emergency Department (HOSPITAL_COMMUNITY)
Admission: EM | Admit: 2016-03-15 | Discharge: 2016-03-15 | Disposition: A | Discharge status: Home / Self Care | Payer: Managed Care, Other (non HMO) | Attending: Emergency Medicine | Admitting: Emergency Medicine

## 2016-03-15 DIAGNOSIS — R059 Cough, unspecified: Secondary | ICD-10-CM

## 2016-03-15 DIAGNOSIS — J45909 Unspecified asthma, uncomplicated: Secondary | ICD-10-CM | POA: Insufficient documentation

## 2016-03-15 DIAGNOSIS — R05 Cough: Secondary | ICD-10-CM

## 2016-03-15 DIAGNOSIS — J0100 Acute maxillary sinusitis, unspecified: Secondary | ICD-10-CM

## 2016-03-15 DIAGNOSIS — F17210 Nicotine dependence, cigarettes, uncomplicated: Secondary | ICD-10-CM | POA: Insufficient documentation

## 2016-03-15 MED ORDER — PREDNISONE 20 MG PO TABS: 20.0000 mg | ORAL_TABLET | Freq: Two times a day (BID) | ORAL | 0 refills | Status: DC

## 2016-03-15 MED ORDER — PREDNISONE 20 MG PO TABS
60.0000 mg | ORAL_TABLET | Freq: Once | ORAL | Status: AC
Start: 2016-03-15 — End: 2016-03-15
  Administered 2016-03-15: 60 mg via ORAL
  Filled 2016-03-15: qty 3

## 2016-03-15 MED ORDER — ALBUTEROL SULFATE (2.5 MG/3ML) 0.083% IN NEBU: 2.5000 mg | INHALATION_SOLUTION | RESPIRATORY_TRACT | Status: DC | PRN

## 2016-03-15 MED ORDER — ALBUTEROL SULFATE HFA 108 (90 BASE) MCG/ACT IN AERS: 1.0000 | INHALATION_SPRAY | Freq: Four times a day (QID) | RESPIRATORY_TRACT | 0 refills | Status: DC | PRN

## 2016-03-15 MED ORDER — GUAIFENESIN-CODEINE 100-10 MG/5ML PO SYRP: 5.0000 mL | ORAL_SOLUTION | Freq: Three times a day (TID) | ORAL | 0 refills | Status: DC | PRN

## 2016-03-15 MED ORDER — DOXYCYCLINE HYCLATE 100 MG PO TABS
100.0000 mg | ORAL_TABLET | Freq: Once | ORAL | Status: AC
  Administered 2016-03-15: 100 mg via ORAL
  Filled 2016-03-15: qty 1

## 2016-03-15 MED ORDER — DOXYCYCLINE HYCLATE 100 MG PO CAPS: 100.0000 mg | ORAL_CAPSULE | Freq: Two times a day (BID) | ORAL | 0 refills | Status: DC

## 2016-03-15 NOTE — ED Provider Notes (Signed)
[facility]PT Provider Note   CSN: [id number] Arrival date & time: 03/15/16  0038   By signing my name below, I, Clovis Pu, attest that this documentation has been prepared under the direction and in the presence of Rolland Porter, MD  Electronically Signed: Clovis Pu, ED Scribe. 03/15/16. 3:58 AM.   History   Chief Complaint Chief Complaint  Patient presents with  . Cough    The history is provided by the patient. No language interpreter was used.   HPI Comments:  Jenny Evans is a 45 y.o. female who presents to the Emergency Department complaining of a moderate dry cough which began 2-3 weeks ago. Associated symptoms include congestion, sinus pain and sore throat.  Pt previously visited the ED for her current symptoms and was prescribed cough pearls. She denies a hx of asthma and lung problems. Pt notes she used to use an inhaler a "long time ago". No alleviating factors noted. Pt denies any other complaints at this time.    Past Medical History:  Diagnosis Date  . Anemia   . Asthma   . Chronic pain   . GERD (gastroesophageal reflux disease)   . Migraine   . Sickle cell trait (HCC)   . Ulcer     Patient Active Problem List   Diagnosis Date Noted  . Blood loss anemia 11/12/2015  . Melena 11/12/2015  . Nonspecific abnormal electrocardiogram (ECG) (EKG) 04/03/2015  . Palpitations 04/03/2015  . Neck pain 04/03/2015  . Colitis 07/10/2014  . Absolute anemia   . Post-operative state 08/26/2013  . Rectocele 08/26/2013  . Anal or rectal pain 07/29/2013  . Headache(784.0) 02/27/2012  . ACUTE BRONCHITIS 03/25/2010  . OBESITY 09/06/2009  . INSOMNIA 01/23/2009  . HYPOKALEMIA 11/01/2008  . DYSPEPSIA&OTHER [facility] DISORDERS FUNCTION STOMACH 06/07/2008  . ALLERGIC RHINITIS 03/23/2008  . CONSTIPATION 01/20/2008  . SHORTNESS OF BREATH 08/27/2007  . TOBACCO ABUSE 03/09/2007  . Migraine 03/09/2007  . ABSCESS, TOOTH 03/09/2007  . GERD (gastroesophageal reflux disease)  03/09/2007  . HEADACHE, CLUSTER 02/25/2007  . ECZEMA 02/25/2007    Past Surgical History:  Procedure Laterality Date  . ESOPHAGOGASTRODUODENOSCOPY (EGD) WITH PROPOFOL N/A 04/04/2013   Procedure: ESOPHAGOGASTRODUODENOSCOPY (EGD) WITH PROPOFOL;  Surgeon: Willis Modena, MD;  Location: [facility];  Service: Endoscopy;  Laterality: N/A;  . EXAMINATION UNDER ANESTHESIA N/A 08/10/2013   Procedure: RECTAL EXAM UNDER ANESTHESIA;  Surgeon: Clovis Pu. Cornett, MD;  Location: [facility];  Service: General;  Laterality: N/A;  ligation  . HEMORRHOID SURGERY    . MYRINGOTOMY     age 87    OB History    No data available       Home Medications    Prior to Admission medications   Medication Sig Start Date End Date Taking? Authorizing Provider  albuterol (PROVENTIL HFA;VENTOLIN HFA) 108 (90 Base) MCG/ACT inhaler Inhale 1-2 puffs into the lungs every 6 (six) hours as needed for wheezing. 03/15/16   Rolland Porter, MD  benzonatate (TESSALON) 100 MG capsule Take 1 capsule (100 mg total) by mouth every 8 (eight) hours. 03/03/16   Samantha Tripp Dowless, PA-C  cyclobenzaprine (FLEXERIL) 10 MG tablet Take 1 tablet (10 mg total) by mouth 3 (three) times daily as needed for muscle spasms. Patient not taking: Reported on 03/03/2016 01/05/16   Geoffery Lyons, MD  dextromethorphan-guaiFENesin [facility] DM) 30-600 MG 12hr tablet Take 1 tablet by mouth 2 (two) times daily. 03/03/16   Samantha Tripp Dowless, PA-C  doxycycline (VIBRAMYCIN) 100 MG capsule  Take 1 capsule (100 mg total) by mouth 2 (two) times daily. 03/15/16   Rolland PorterMark Candis Kabel, MD  Esomeprazole Magnesium (NEXIUM 24HR) 20 MG TBEC Take 20 mg by mouth daily as needed (for heartburn.).    Historical Provider, MD  fluticasone (FLONASE) 50 MCG/ACT nasal spray Place 2 sprays into both nostrils daily. Patient not taking: Reported on 11/12/2015 03/29/15   Garlon HatchetLisa M Sanders, PA-C  guaiFENesin-codeine (CHERATUSSIN AC) 100-10 MG/5ML syrup Take 5 mLs by mouth 3 (three)  times daily as needed for cough. 03/15/16   Rolland PorterMark Ashunti Schofield, MD  naproxen (NAPROSYN) 500 MG tablet Take 1 tablet (500 mg total) by mouth 2 (two) times daily. Patient not taking: Reported on 03/03/2016 01/05/16   Geoffery Lyonsouglas Delo, MD  pantoprazole (PROTONIX) 20 MG tablet Take 1 tablet (20 mg total) by mouth daily. Patient not taking: Reported on 01/04/2016 11/16/15   Rolm GalaStevi Barrett, PA-C  predniSONE (DELTASONE) 20 MG tablet Take 1 tablet (20 mg total) by mouth 2 (two) times daily with a meal. 03/15/16   Rolland PorterMark Oberon Hehir, MD  sucralfate (CARAFATE) 1 GM/10ML suspension Take 10 mLs (1 g total) by mouth 4 (four) times daily -  with meals and at bedtime. Patient not taking: Reported on 01/04/2016 11/16/15   Alveta HeimlichStevi Barrett, PA-C    Family History Family History  Problem Relation Age of Onset  . Heart disease Mother     Arrythmia  . Kidney disease Mother   . Hypertension Mother   . COPD Mother   . Diabetes Mother   . Heart disease Father   . Crohn's disease Maternal Aunt     Social History Social History  Substance Use Topics  . Smoking status: Current Some Day Smoker    Packs/day: 0.25    Years: 15.00    Types: Cigarettes  . Smokeless tobacco: Never Used  . Alcohol use Yes     Comment: very rare     Allergies   Hydrocodone-acetaminophen   Review of Systems Review of Systems  Constitutional: Negative for appetite change, chills, diaphoresis, fatigue and fever.  HENT: Positive for congestion and sore throat. Negative for mouth sores and trouble swallowing.   Eyes: Negative for visual disturbance.  Respiratory: Positive for cough. Negative for chest tightness, shortness of breath and wheezing.   Cardiovascular: Negative for chest pain.  Gastrointestinal: Negative for abdominal distention, abdominal pain, diarrhea, nausea and vomiting.  Endocrine: Negative for polydipsia, polyphagia and polyuria.  Genitourinary: Negative for dysuria, frequency and hematuria.  Musculoskeletal: Negative for gait problem.    Skin: Negative for color change, pallor and rash.  Neurological: Negative for dizziness, syncope, light-headedness and headaches.  Hematological: Does not bruise/bleed easily.  Psychiatric/Behavioral: Negative for behavioral problems and confusion.     Physical Exam Updated Vital Signs BP 112/88 (BP Location: Left Arm)   Pulse 96   Temp 98.1 F (36.7 C) (Oral)   Resp 16   LMP 03/08/2016   SpO2 100%   Physical Exam  Constitutional: She is oriented to person, place, and time. She appears well-developed and well-nourished. No distress.  HENT:  Head: Normocephalic.  Eyes: Conjunctivae are normal. Pupils are equal, round, and reactive to light. No scleral icterus.  Neck: Normal range of motion. Neck supple. No thyromegaly present.  Cardiovascular: Normal rate and regular rhythm.  Exam reveals no gallop and no friction rub.   No murmur heard. Pulmonary/Chest: Effort normal and breath sounds normal. No respiratory distress. She has no wheezes. She has no rales.  Near constant staccato cough  Abdominal: Soft. Bowel sounds are normal. She exhibits no distension. There is no tenderness. There is no rebound.  Musculoskeletal: Normal range of motion.  Neurological: She is alert and oriented to person, place, and time.  Skin: Skin is warm and dry. No rash noted.  Psychiatric: She has a normal mood and affect. Her behavior is normal.     ED Treatments / Results  DIAGNOSTIC STUDIES:  Oxygen Saturation is 100% on RA, normal by my interpretation.    COORDINATION OF CARE:  3:54 AM Discussed treatment plan with pt at bedside and pt agreed to plan.  Labs (all labs ordered are listed, but only abnormal results are displayed) Labs Reviewed - No data to display  EKG  EKG Interpretation None       Radiology Dg Chest 2 View  Result Date: 03/15/2016 CLINICAL DATA:  45 y/o F; shortness of breath and productive cough for 2-3 weeks. EXAM: CHEST  2 VIEW COMPARISON:  10/23/2015 chest  radiograph. FINDINGS: Stable cardiomediastinal silhouette within normal limits. Mild rightward curvature of the lower thoracic spine. Clear lungs. No pleural effusion or pneumothorax. IMPRESSION: No active cardiopulmonary disease. Electronically Signed   By: Mitzi Hansen M.D.   On: 03/15/2016 02:12    Procedures Procedures (including critical care time)  Medications Ordered in ED Medications  albuterol (PROVENTIL) (2.5 MG/3ML) 0.083% nebulizer solution 2.5 mg (not administered)  predniSONE (DELTASONE) tablet 60 mg (not administered)  doxycycline (VIBRA-TABS) tablet 100 mg (not administered)     Initial Impression / Assessment and Plan / ED Course  I have reviewed the triage vital signs and the nursing notes.  Pertinent labs & imaging results that were available during my care of the patient were reviewed by me and considered in my medical decision making (see chart for details).  Clinical Course    Albuterol neurologic treatment. Cough is improved. Also given doxycycline, prednisone,. Final Clinical Impressions(s) / ED Diagnoses   Final diagnoses:  Cough  Acute maxillary sinusitis, recurrence not specified    Treat for sinus infection with doxycycline. Cheratussin for cough. Prednisone and albuterol.   New Prescriptions New Prescriptions   ALBUTEROL (PROVENTIL HFA;VENTOLIN HFA) 108 (90 BASE) MCG/ACT INHALER    Inhale 1-2 puffs into the lungs every 6 (six) hours as needed for wheezing.   DOXYCYCLINE (VIBRAMYCIN) 100 MG CAPSULE    Take 1 capsule (100 mg total) by mouth 2 (two) times daily.   GUAIFENESIN-CODEINE (CHERATUSSIN AC) 100-10 MG/5ML SYRUP    Take 5 mLs by mouth 3 (three) times daily as needed for cough.   PREDNISONE (DELTASONE) 20 MG TABLET    Take 1 tablet (20 mg total) by mouth 2 (two) times daily with a meal.     Rolland Porter, MD 03/15/16 [phone]

## 2016-03-15 NOTE — ED Triage Notes (Signed)
C/o congestion and non-productive cough x 2 1/2-3 weeks.  Reports fever yesterday.

## 2016-04-13 ENCOUNTER — Emergency Department (HOSPITAL_COMMUNITY): Payer: Managed Care, Other (non HMO)

## 2016-04-13 ENCOUNTER — Encounter (HOSPITAL_COMMUNITY): Payer: Self-pay

## 2016-04-13 ENCOUNTER — Emergency Department (HOSPITAL_COMMUNITY)
Admission: EM | Admit: 2016-04-13 | Discharge: 2016-04-13 | Disposition: A | Discharge status: Home / Self Care | Payer: Managed Care, Other (non HMO) | Attending: Emergency Medicine | Admitting: Emergency Medicine

## 2016-04-13 DIAGNOSIS — F17210 Nicotine dependence, cigarettes, uncomplicated: Secondary | ICD-10-CM | POA: Insufficient documentation

## 2016-04-13 DIAGNOSIS — X509XXA Other and unspecified overexertion or strenuous movements or postures, initial encounter: Secondary | ICD-10-CM | POA: Insufficient documentation

## 2016-04-13 DIAGNOSIS — S46911A Strain of unspecified muscle, fascia and tendon at shoulder and upper arm level, right arm, initial encounter: Secondary | ICD-10-CM

## 2016-04-13 DIAGNOSIS — Y939 Activity, unspecified: Secondary | ICD-10-CM | POA: Insufficient documentation

## 2016-04-13 DIAGNOSIS — Y999 Unspecified external cause status: Secondary | ICD-10-CM | POA: Insufficient documentation

## 2016-04-13 DIAGNOSIS — Y929 Unspecified place or not applicable: Secondary | ICD-10-CM | POA: Insufficient documentation

## 2016-04-13 DIAGNOSIS — Z79899 Other long term (current) drug therapy: Secondary | ICD-10-CM | POA: Insufficient documentation

## 2016-04-13 DIAGNOSIS — J45909 Unspecified asthma, uncomplicated: Secondary | ICD-10-CM | POA: Insufficient documentation

## 2016-04-13 MED ORDER — IBUPROFEN 600 MG PO TABS
600.0000 mg | ORAL_TABLET | Freq: Four times a day (QID) | ORAL | 0 refills | Status: DC | PRN
Start: 2016-04-13 — End: 2016-07-21

## 2016-04-13 NOTE — ED Provider Notes (Signed)
[facility]-EMERGENCY DEPT Provider Note   CSN: [id number] Arrival date & time: 04/13/16  1535   By signing my name below, I, Jenny Evans, attest that this documentation has been prepared under the direction and in the presence of Jenny Horseman, PA-C. Electronically Signed: Lennie Evans, Scribe. 04/13/16. 4:27 PM.   History   Chief Complaint Chief Complaint  Patient presents with  . Shoulder Pain   HPI   HPI Comments: Jenny Evans is a 45 y.o. female who presents to the Emergency Department complaining of right shoulder pain that began this morning. States that she was moving furniture earlier today and thinks she may have pulled a muscle. Describes the pain as throbbing and burning. Took Motrin for pain relief which improved her pain.    Past Medical History:  Diagnosis Date  . Anemia   . Asthma   . Chronic pain   . GERD (gastroesophageal reflux disease)   . Migraine   . Sickle cell trait (HCC)   . Ulcer [facility])     Patient Active Problem List   Diagnosis Date Noted  . Blood loss anemia 11/12/2015  . Melena 11/12/2015  . Nonspecific abnormal electrocardiogram (ECG) (EKG) 04/03/2015  . Palpitations 04/03/2015  . Neck pain 04/03/2015  . Colitis 07/10/2014  . Absolute anemia   . Post-operative state 08/26/2013  . Rectocele 08/26/2013  . Anal or rectal pain 07/29/2013  . Headache(784.0) 02/27/2012  . ACUTE BRONCHITIS 03/25/2010  . OBESITY 09/06/2009  . INSOMNIA 01/23/2009  . HYPOKALEMIA 11/01/2008  . DYSPEPSIA&OTHER [facility] DISORDERS FUNCTION STOMACH 06/07/2008  . ALLERGIC RHINITIS 03/23/2008  . CONSTIPATION 01/20/2008  . SHORTNESS OF BREATH 08/27/2007  . TOBACCO ABUSE 03/09/2007  . Migraine 03/09/2007  . ABSCESS, TOOTH 03/09/2007  . GERD (gastroesophageal reflux disease) 03/09/2007  . HEADACHE, CLUSTER 02/25/2007  . ECZEMA 02/25/2007    Past Surgical History:  Procedure Laterality Date  . ESOPHAGOGASTRODUODENOSCOPY (EGD) WITH PROPOFOL N/A 04/04/2013   Procedure: ESOPHAGOGASTRODUODENOSCOPY (EGD) WITH PROPOFOL;  Surgeon: Willis Modena, MD;  Location: [facility];  Service: Endoscopy;  Laterality: N/A;  . EXAMINATION UNDER ANESTHESIA N/A 08/10/2013   Procedure: RECTAL EXAM UNDER ANESTHESIA;  Surgeon: Clovis Pu. Cornett, MD;  Location: [facility];  Service: General;  Laterality: N/A;  ligation  . HEMORRHOID SURGERY    . MYRINGOTOMY     age 57    OB History    No data available       Home Medications    Prior to Admission medications   Medication Sig Start Date End Date Taking? Authorizing Provider  albuterol (PROVENTIL HFA;VENTOLIN HFA) 108 (90 Base) MCG/ACT inhaler Inhale 1-2 puffs into the lungs every 6 (six) hours as needed for wheezing. 03/15/16   Rolland Porter, MD  benzonatate (TESSALON) 100 MG capsule Take 1 capsule (100 mg total) by mouth every 8 (eight) hours. 03/03/16   Samantha Tripp Dowless, PA-C  cyclobenzaprine (FLEXERIL) 10 MG tablet Take 1 tablet (10 mg total) by mouth 3 (three) times daily as needed for muscle spasms. Patient not taking: Reported on 03/03/2016 01/05/16   Geoffery Lyons, MD  dextromethorphan-guaiFENesin [facility] DM) 30-600 MG 12hr tablet Take 1 tablet by mouth 2 (two) times daily. 03/03/16   Samantha Tripp Dowless, PA-C  doxycycline (VIBRAMYCIN) 100 MG capsule Take 1 capsule (100 mg total) by mouth 2 (two) times daily. 03/15/16   Rolland Porter, MD  Esomeprazole Magnesium (NEXIUM 24HR) 20 MG TBEC Take 20 mg by mouth daily as needed (for heartburn.).    Historical Provider,  MD  fluticasone (FLONASE) 50 MCG/ACT nasal spray Place 2 sprays into both nostrils daily. Patient not taking: Reported on 11/12/2015 03/29/15   Garlon HatchetLisa M Sanders, PA-C  guaiFENesin-codeine (CHERATUSSIN AC) 100-10 MG/5ML syrup Take 5 mLs by mouth 3 (three) times daily as needed for cough. 03/15/16   Rolland PorterMark James, MD  ibuprofen (ADVIL,MOTRIN) 600 MG tablet Take 1 tablet (600 mg total) by mouth every 6 (six) hours as needed. 04/13/16   Jenny Horsemanobert  Joelene Barriere, PA-C  naproxen (NAPROSYN) 500 MG tablet Take 1 tablet (500 mg total) by mouth 2 (two) times daily. Patient not taking: Reported on 03/03/2016 01/05/16   Geoffery Lyonsouglas Delo, MD  pantoprazole (PROTONIX) 20 MG tablet Take 1 tablet (20 mg total) by mouth daily. Patient not taking: Reported on 01/04/2016 11/16/15   Rolm GalaStevi Barrett, PA-C  predniSONE (DELTASONE) 20 MG tablet Take 1 tablet (20 mg total) by mouth 2 (two) times daily with a meal. 03/15/16   Rolland PorterMark James, MD  sucralfate (CARAFATE) 1 GM/10ML suspension Take 10 mLs (1 g total) by mouth 4 (four) times daily -  with meals and at bedtime. Patient not taking: Reported on 01/04/2016 11/16/15   Alveta HeimlichStevi Barrett, PA-C    Family History Family History  Problem Relation Age of Onset  . Heart disease Mother     Arrythmia  . Kidney disease Mother   . Hypertension Mother   . COPD Mother   . Diabetes Mother   . Heart disease Father   . Crohn's disease Maternal Aunt     Social History Social History  Substance Use Topics  . Smoking status: Current Some Day Smoker    Packs/day: 0.25    Years: 15.00    Types: Cigarettes  . Smokeless tobacco: Never Used  . Alcohol use Yes     Comment: very rare     Allergies   Hydrocodone-acetaminophen   Review of Systems Review of Systems  Constitutional: Negative for chills and fever.  Respiratory: Negative for shortness of breath.   Cardiovascular: Negative for chest pain.  Gastrointestinal: Negative for constipation, diarrhea, nausea and vomiting.  Genitourinary: Negative for dysuria.  Musculoskeletal: Positive for arthralgias and myalgias.     Physical Exam Updated Vital Signs BP 104/75 (BP Location: Left Arm)   Pulse 68   Temp 97.7 F (36.5 C) (Oral)   Resp 16   LMP 04/07/2016   SpO2 100%   Physical Exam Physical Exam  Constitutional: Pt appears well-developed and well-nourished. No distress.  HENT:  Head: Normocephalic and atraumatic.  Eyes: Conjunctivae are normal.  Neck: Normal  range of motion.  Cardiovascular: Normal rate, regular rhythm and intact distal pulses.   Capillary refill < 3 sec  Pulmonary/Chest: Effort normal and breath sounds normal.  Musculoskeletal:  Right Shoulder: Pt exhibits tenderness to palpation anteriorly. Pt exhibits no edema.  ROM: 4/5 limited by pain  Neurological: Pt  is alert. Coordination normal.  Sensation 5/5 Strength 4/5 limited by pain  Skin: Skin is warm and dry. Pt is not diaphoretic.  No tenting of the skin  Psychiatric: Pt has a normal mood and affect.  Nursing note and vitals reviewed.   ED Treatments / Results  DIAGNOSTIC STUDIES: Oxygen Saturation is 100% on RA, normal by my interpretation.    COORDINATION OF CARE: 4:26 PM Discussed treatment plan with pt at bedside and pt agreed to plan.  Labs (all labs ordered are listed, but only abnormal results are displayed) Labs Reviewed - No data to display  EKG  EKG Interpretation  None       Radiology Dg Shoulder Right  Result Date: 04/13/2016 CLINICAL DATA:  Pain after heavy lifting today. EXAM: RIGHT SHOULDER - 2+ VIEW COMPARISON:  August 22, 2014 FINDINGS: There is no evidence of fracture or dislocation. There is no evidence of arthropathy or other focal bone abnormality. Soft tissues are unremarkable. IMPRESSION: Negative. Electronically Signed   By: Gerome Samavid  Williams III M.D   On: 04/13/2016 16:19    Procedures Procedures (including critical care time)  Medications Ordered in ED Medications - No data to display   Initial Impression / Assessment and Plan / ED Course  I have reviewed the triage vital signs and the nursing notes.  Pertinent labs & imaging results that were available during my care of the patient were reviewed by me and considered in my medical decision making (see chart for details).  Clinical Course    Patient X-Ray negative for obvious fracture or dislocation.  Pt advised to follow up with orthopedics. Patient given sling while in ED,  conservative therapy recommended and discussed. Patient will be discharged home & is agreeable with above plan. Returns precautions discussed. Pt appears safe for discharge.   Final Clinical Impressions(s) / ED Diagnoses   Final diagnoses:  Strain of right shoulder, initial encounter   I personally performed the services described in this documentation, which was scribed in my presence. The recorded information has been reviewed and is accurate.     New Prescriptions New Prescriptions   IBUPROFEN (ADVIL,MOTRIN) 600 MG TABLET    Take 1 tablet (600 mg total) by mouth every 6 (six) hours as needed.     Jenny Horsemanobert Lateesha Bezold, PA-C 04/13/16 1711    Tilden FossaElizabeth Rees, MD 04/13/16 1950

## 2016-04-13 NOTE — ED Triage Notes (Signed)
Pt c/o R shoulder pain starting this morning.  Pain score 8/10.  Pt reports taking motrin w/o relief.  Limited ROM noted.  Pt reports that she was moving furniture this morning/afternoon and thinks she pulled a muscle.

## 2016-04-19 ENCOUNTER — Encounter (HOSPITAL_COMMUNITY): Payer: Self-pay

## 2016-04-19 ENCOUNTER — Emergency Department (HOSPITAL_COMMUNITY)
Admission: EM | Admit: 2016-04-19 | Discharge: 2016-04-19 | Disposition: A | Discharge status: Home / Self Care | Payer: Managed Care, Other (non HMO) | Attending: Emergency Medicine | Admitting: Emergency Medicine

## 2016-04-19 DIAGNOSIS — J45909 Unspecified asthma, uncomplicated: Secondary | ICD-10-CM | POA: Insufficient documentation

## 2016-04-19 DIAGNOSIS — F17210 Nicotine dependence, cigarettes, uncomplicated: Secondary | ICD-10-CM | POA: Insufficient documentation

## 2016-04-19 DIAGNOSIS — D6489 Other specified anemias: Secondary | ICD-10-CM | POA: Insufficient documentation

## 2016-04-19 DIAGNOSIS — D649 Anemia, unspecified: Secondary | ICD-10-CM

## 2016-04-19 DIAGNOSIS — E876 Hypokalemia: Secondary | ICD-10-CM

## 2016-04-19 DIAGNOSIS — R1013 Epigastric pain: Secondary | ICD-10-CM

## 2016-04-19 LAB — COMPREHENSIVE METABOLIC PANEL
ALBUMIN: 3.5 g/dL (ref 3.5–5.0)
ALK PHOS: 52 U/L (ref 38–126)
ALT: 9 U/L — AB (ref 14–54)
AST: 23 U/L (ref 15–41)
Anion gap: 11 (ref 5–15)
BILIRUBIN TOTAL: 0.2 mg/dL — AB (ref 0.3–1.2)
BUN: <5 mg/dL — ABNORMAL LOW (ref 6–20)
CALCIUM: 8.8 mg/dL — AB (ref 8.9–10.3)
CO2: 20 mmol/L — ABNORMAL LOW (ref 22–32)
CREATININE: 0.7 mg/dL (ref 0.44–1.00)
Chloride: 106 mmol/L (ref 101–111)
GFR calc Af Amer: >60 mL/min (ref >60)
GFR calc non Af Amer: >60 mL/min (ref >60)
GLUCOSE: 103 mg/dL — AB (ref 65–99)
Potassium: 3.2 mmol/L — ABNORMAL LOW (ref 3.5–5.1)
SODIUM: 137 mmol/L (ref 135–145)
TOTAL PROTEIN: 6.1 g/dL — AB (ref 6.5–8.1)

## 2016-04-19 LAB — CBC
HCT: 24.4 % — ABNORMAL LOW (ref 36.0–46.0)
Hemoglobin: 7.2 g/dL — ABNORMAL LOW (ref 12.0–15.0)
MCH: 20.1 pg — AB (ref 26.0–34.0)
MCHC: 29.5 g/dL — AB (ref 30.0–36.0)
MCV: 68 fL — ABNORMAL LOW (ref 78.0–100.0)
PLATELETS: 524 10*3/uL — AB (ref 150–400)
RBC: 3.59 MIL/uL — ABNORMAL LOW (ref 3.87–5.11)
RDW: 24 % — AB (ref 11.5–15.5)
WBC: 6.5 10*3/uL (ref 4.0–10.5)

## 2016-04-19 LAB — URINALYSIS, ROUTINE W REFLEX MICROSCOPIC
BILIRUBIN URINE: NEGATIVE
Glucose, UA: NEGATIVE mg/dL
HGB URINE DIPSTICK: NEGATIVE
KETONES UR: NEGATIVE mg/dL
Leukocytes, UA: NEGATIVE
Nitrite: NEGATIVE
PROTEIN: NEGATIVE mg/dL
SPECIFIC GRAVITY, URINE: 1.014 (ref 1.005–1.030)
pH: 6 (ref 5.0–8.0)

## 2016-04-19 LAB — LIPASE, BLOOD: Lipase: 32 U/L (ref 11–51)

## 2016-04-19 MED ORDER — POTASSIUM CHLORIDE CRYS ER 20 MEQ PO TBCR
40.0000 meq | EXTENDED_RELEASE_TABLET | Freq: Once | ORAL | Status: AC
  Administered 2016-04-19: 40 meq via ORAL
  Filled 2016-04-19: qty 2

## 2016-04-19 MED ORDER — FERROUS SULFATE 325 (65 FE) MG PO TABS: 325.0000 mg | ORAL_TABLET | Freq: Two times a day (BID) | ORAL | 0 refills | Status: DC

## 2016-04-19 MED ORDER — PANTOPRAZOLE SODIUM 40 MG PO TBEC: 40.0000 mg | DELAYED_RELEASE_TABLET | Freq: Every day | ORAL | 0 refills | Status: DC

## 2016-04-19 MED ORDER — OXYCODONE-ACETAMINOPHEN 5-325 MG PO TABS
1.0000 | ORAL_TABLET | Freq: Once | ORAL | Status: AC
  Administered 2016-04-19: 1 via ORAL
  Filled 2016-04-19: qty 1

## 2016-04-19 MED ORDER — GI COCKTAIL ~~LOC~~
30.0000 mL | Freq: Once | ORAL | Status: AC
  Administered 2016-04-19: 30 mL via ORAL
  Filled 2016-04-19: qty 30

## 2016-04-19 MED ORDER — FAMOTIDINE 20 MG PO TABS
20.0000 mg | ORAL_TABLET | Freq: Once | ORAL | Status: AC
  Administered 2016-04-19: 20 mg via ORAL
  Filled 2016-04-19: qty 1

## 2016-04-19 NOTE — ED Provider Notes (Addendum)
[facility]-EMERGENCY DEPT Provider Note   CSN: [id number] Arrival date & time: 04/19/16  1959     History   Chief Complaint Chief Complaint  Patient presents with  . Abdominal Pain    HPI Kathryne GinChiha Z Burkel is a 45 y.o. female.  Patient c/o epigastric pain for the past couple days. Pain constant, dull, moderate, occasionally worse after eating. Does not radiate. No back or flank pain.  No vomiting. Having normal bms including today. No abd distension. States similar to pain w ulcers in past. States compliant w home meds, including acid blocker medication - but then indicates she only takes it a couple times per week. Denies fever or chills. Denies hx pancreatitis or gallstones. Hx chronic anemia.  Denies any recent blood loss. No faintness or lightheadedness. No sob.    The history is provided by the patient.  Abdominal Pain   Pertinent negatives include fever, vomiting and headaches.    Past Medical History:  Diagnosis Date  . Anemia   . Asthma   . Chronic pain   . GERD (gastroesophageal reflux disease)   . Migraine   . Sickle cell trait (HCC)   . Ulcer [facility](HCC)     Patient Active Problem List   Diagnosis Date Noted  . Blood loss anemia 11/12/2015  . Melena 11/12/2015  . Nonspecific abnormal electrocardiogram (ECG) (EKG) 04/03/2015  . Palpitations 04/03/2015  . Neck pain 04/03/2015  . Colitis 07/10/2014  . Absolute anemia   . Post-operative state 08/26/2013  . Rectocele 08/26/2013  . Anal or rectal pain 07/29/2013  . Headache(784.0) 02/27/2012  . ACUTE BRONCHITIS 03/25/2010  . OBESITY 09/06/2009  . INSOMNIA 01/23/2009  . HYPOKALEMIA 11/01/2008  . D[facility]IA&OTHER [facility] DISORDERS FUNCTION STOMACH 06/07/2008  . ALLERGIC RHINITIS 03/23/2008  . CONSTIPATION 01/20/2008  . SHORTNESS OF BREATH 08/27/2007  . TOBACCO ABUSE 03/09/2007  . Migraine 03/09/2007  . ABSCESS, TOOTH 03/09/2007  . GERD (gastroesophageal reflux disease) 03/09/2007  . HEADACHE, CLUSTER 02/25/2007  .  ECZEMA 02/25/2007    Past Surgical History:  Procedure Laterality Date  . ESOPHAGOGASTRODUODENOSCOPY (EGD) WITH PROPOFOL N/A 04/04/2013   Procedure: ESOPHAGOGASTRODUODENOSCOPY (EGD) WITH PROPOFOL;  Surgeon: Willis ModenaWilliam Outlaw, MD;  Location: [facility];  Service: Endoscopy;  Laterality: N/A;  . EXAMINATION UNDER ANESTHESIA N/A 08/10/2013   Procedure: RECTAL EXAM UNDER ANESTHESIA;  Surgeon: Clovis Puhomas A. Cornett, MD;  Location: [facility];  Service: General;  Laterality: N/A;  ligation  . HEMORRHOID SURGERY    . MYRINGOTOMY     age 45    OB History    No data available       Home Medications    Prior to Admission medications   Medication Sig Start Date End Date Taking? Authorizing Provider  albuterol (PROVENTIL HFA;VENTOLIN HFA) 108 (90 Base) MCG/ACT inhaler Inhale 1-2 puffs into the lungs every 6 (six) hours as needed for wheezing. 03/15/16   Rolland PorterMark James, MD  benzonatate (TESSALON) 100 MG capsule Take 1 capsule (100 mg total) by mouth every 8 (eight) hours. 03/03/16   Samantha Tripp Dowless, PA-C  cyclobenzaprine (FLEXERIL) 10 MG tablet Take 1 tablet (10 mg total) by mouth 3 (three) times daily as needed for muscle spasms. Patient not taking: Reported on 03/03/2016 01/05/16   Geoffery Lyonsouglas Delo, MD  dextromethorphan-guaiFENesin [facility](MUCINEX DM) 30-600 MG 12hr tablet Take 1 tablet by mouth 2 (two) times daily. 03/03/16   Samantha Tripp Dowless, PA-C  doxycycline (VIBRAMYCIN) 100 MG capsule Take 1 capsule (100 mg total) by mouth 2 (two) times  daily. 03/15/16   Rolland Porter, MD  Esomeprazole Magnesium (NEXIUM 24HR) 20 MG TBEC Take 20 mg by mouth daily as needed (for heartburn.).    Historical Provider, MD  fluticasone (FLONASE) 50 MCG/ACT nasal spray Place 2 sprays into both nostrils daily. Patient not taking: Reported on 11/12/2015 03/29/15   Garlon Hatchet, PA-C  guaiFENesin-codeine (CHERATUSSIN AC) 100-10 MG/5ML syrup Take 5 mLs by mouth 3 (three) times daily as needed for cough. 03/15/16   Rolland Porter, MD  ibuprofen (ADVIL,MOTRIN) 600 MG tablet Take 1 tablet (600 mg total) by mouth every 6 (six) hours as needed. 04/13/16   [name], PA-C  naproxen (NAPROSYN) 500 MG tablet Take 1 tablet (500 mg total) by mouth 2 (two) times daily. Patient not taking: Reported on 03/03/2016 01/05/16   Geoffery Lyons, MD  pantoprazole (PROTONIX) 20 MG tablet Take 1 tablet (20 mg total) by mouth daily. Patient not taking: Reported on 01/04/2016 11/16/15   Rolm Gala Barrett, PA-C  predniSONE (DELTASONE) 20 MG tablet Take 1 tablet (20 mg total) by mouth 2 (two) times daily with a meal. 03/15/16   Rolland Porter, MD  sucralfate (CARAFATE) 1 GM/10ML suspension Take 10 mLs (1 g total) by mouth 4 (four) times daily -  with meals and at bedtime. Patient not taking: Reported on 01/04/2016 11/16/15   Alveta Heimlich, PA-C    Family History Family History  Problem Relation Age of Onset  . Heart disease Mother     Arrythmia  . Kidney disease Mother   . Hypertension Mother   . COPD Mother   . Diabetes Mother   . Heart disease Father   . Crohn's disease Maternal Aunt     Social History Social History  Substance Use Topics  . Smoking status: Current Some Day Smoker    Packs/day: 0.25    Years: 15.00    Types: Cigarettes  . Smokeless tobacco: Never Used  . Alcohol use Yes     Comment: very rare     Allergies   Hydrocodone-acetaminophen   Review of Systems Review of Systems  Constitutional: Negative for fever.  HENT: Negative for sore throat.   Eyes: Negative for redness.  Respiratory: Negative for shortness of breath.   Cardiovascular: Negative for chest pain.  Gastrointestinal: Positive for abdominal pain. Negative for vomiting.  Genitourinary: Negative for flank pain.  Musculoskeletal: Negative for back pain and neck pain.  Skin: Negative for rash.  Neurological: Negative for headaches.  Hematological: Does not bruise/bleed easily.  Psychiatric/Behavioral: Negative for confusion.     Physical  Exam Updated Vital Signs BP 105/74 (BP Location: Right Arm)   Pulse 105   Temp 97.4 F (36.3 C) (Oral)   Resp 14   Ht 5\' 4"  (1.626 m)   Wt 56 kg   LMP 04/07/2016   SpO2 100%   BMI 21.20 kg/m   Physical Exam  Constitutional: She appears well-developed and well-nourished. No distress.  HENT:  Mouth/Throat: Oropharynx is clear and moist.  Eyes: Conjunctivae are normal. No scleral icterus.  Neck: Neck supple. No tracheal deviation present.  Cardiovascular: Normal rate, regular rhythm, normal heart sounds and intact distal pulses.   No murmur heard. Pulmonary/Chest: Effort normal and breath sounds normal. No respiratory distress.  Abdominal: Soft. Normal appearance and bowel sounds are normal. She exhibits no distension and no mass. There is tenderness. There is no rebound and no guarding. No hernia.  Mild epigastric tenderness.   Genitourinary:  Genitourinary Comments: No cva tenderness  Musculoskeletal:  She exhibits no edema.  Neurological: She is alert.  Skin: Skin is warm and dry. No rash noted.  Psychiatric: She has a normal mood and affect.  Nursing note and vitals reviewed.    ED Treatments / Results  Labs (all labs ordered are listed, but only abnormal results are displayed) Results for orders placed or performed during the hospital encounter of 04/19/16  Lipase, blood  Result Value Ref Range   Lipase 32 11 - 51 U/L  Comprehensive metabolic panel  Result Value Ref Range   Sodium 137 135 - 145 mmol/L   Potassium 3.2 (L) 3.5 - 5.1 mmol/L   Chloride 106 101 - 111 mmol/L   CO2 20 (L) 22 - 32 mmol/L   Glucose, Bld 103 (H) 65 - 99 mg/dL   BUN <5 (L) 6 - 20 mg/dL   Creatinine, Ser 1.61 0.44 - 1.00 mg/dL   Calcium 8.8 (L) 8.9 - 10.3 mg/dL   Total Protein 6.1 (L) 6.5 - 8.1 g/dL   Albumin 3.5 3.5 - 5.0 g/dL   AST 23 15 - 41 U/L   ALT 9 (L) 14 - 54 U/L   Alkaline Phosphatase 52 38 - 126 U/L   Total Bilirubin 0.2 (L) 0.3 - 1.2 mg/dL   GFR calc non Af Amer >60 >60  mL/min   GFR calc Af Amer >60 >60 mL/min   Anion gap 11 5 - 15  CBC  Result Value Ref Range   WBC 6.5 4.0 - 10.5 K/uL   RBC 3.59 (L) 3.87 - 5.11 MIL/uL   Hemoglobin 7.2 (L) 12.0 - 15.0 g/dL   HCT 09.6 (L) 04.5 - 40.9 %   MCV 68.0 (L) 78.0 - 100.0 fL   MCH 20.1 (L) 26.0 - 34.0 pg   MCHC 29.5 (L) 30.0 - 36.0 g/dL   RDW 81.1 (H) 91.4 - 78.2 %   Platelets 524 (H) 150 - 400 K/uL  Urinalysis, Routine w reflex microscopic  Result Value Ref Range   Color, Urine YELLOW YELLOW   APPearance CLEAR CLEAR   Specific Gravity, Urine 1.014 1.005 - 1.030   pH 6.0 5.0 - 8.0   Glucose, UA NEGATIVE NEGATIVE mg/dL   Hgb urine dipstick NEGATIVE NEGATIVE   Bilirubin Urine NEGATIVE NEGATIVE   Ketones, ur NEGATIVE NEGATIVE mg/dL   Protein, ur NEGATIVE NEGATIVE mg/dL   Nitrite NEGATIVE NEGATIVE   Leukocytes, UA NEGATIVE NEGATIVE    EKG  EKG Interpretation None       Radiology No results found.  Procedures Procedures (including critical care time)  Medications Ordered in ED Medications  famotidine (PEPCID) tablet 20 mg (not administered)  gi cocktail (Maalox,Lidocaine,Donnatal) (not administered)  oxyCODONE-acetaminophen (PERCOCET/ROXICET) 5-325 MG per tablet 1 tablet (not administered)     Initial Impression / Assessment and Plan / ED Course  I have reviewed the triage vital signs and the nursing notes.  Pertinent labs & imaging results that were available during my care of the patient were reviewed by me and considered in my medical decision making (see chart for details).  Clinical Course    Labs.   pepcid and gi cocktail for symptom relief.  Reviewed nursing notes and prior charts for additional history.   Patient with hx chronic anemia, microcytic, hypochromic - indicates had been on iron in remote past but not currently. Hct c/w prior.  Will give rx.  No hx sickle cell disease.   Recheck abd soft nt. No emesis.   k low on labs. kcl  po.  Patient currently appears stable  for d/c.   rec close pcp f/u.  Pt states takes acid blocker only prn at home, will give rx.       Final Clinical Impressions(s) / ED Diagnoses   Final diagnoses:  None    New Prescriptions New Prescriptions   No medications on file         Cathren LaineKevin Rubena Roseman, MD 04/19/16 2239

## 2016-04-19 NOTE — ED Triage Notes (Signed)
Pt states that they have known stomach ulcers and pain became worse over the past two days. Pt reports charcoal colored stool. Denies n/v, pt does not have a GI docotor.

## 2016-04-19 NOTE — Discharge Instructions (Signed)
It was our pleasure to provide your ER care today - we hope that you feel better.  Take protonix (acid blocker). You may also try maalox or mylanta as need.  From todays lab tests, you have chronic anemia (hemoglobin 7) - take iron supplement as prescribed, and follow up with primary care doctor in the coming week.   Also from the labs, your potassium level is low (3.2) -eat plenty of fruits and vegetables, and follow up with primary care doctor in 1 week.   Follow up with primary care doctor this week.  Return to ER if worse, new symptoms, fevers, persistent vomiting, other concern.  You were  given pain medication in the ER - no driving for the next 4 hours.

## 2016-04-21 ENCOUNTER — Encounter (HOSPITAL_COMMUNITY): Payer: Self-pay | Admitting: *Deleted

## 2016-04-21 ENCOUNTER — Emergency Department (HOSPITAL_COMMUNITY)
Admission: EM | Admit: 2016-04-21 | Discharge: 2016-04-21 | Disposition: A | Discharge status: Home / Self Care | Payer: Managed Care, Other (non HMO) | Attending: Dermatology | Admitting: Dermatology

## 2016-04-21 DIAGNOSIS — J45909 Unspecified asthma, uncomplicated: Secondary | ICD-10-CM | POA: Insufficient documentation

## 2016-04-21 DIAGNOSIS — R109 Unspecified abdominal pain: Secondary | ICD-10-CM | POA: Insufficient documentation

## 2016-04-21 DIAGNOSIS — Z5321 Procedure and treatment not carried out due to patient leaving prior to being seen by health care provider: Secondary | ICD-10-CM | POA: Insufficient documentation

## 2016-04-21 DIAGNOSIS — Z79899 Other long term (current) drug therapy: Secondary | ICD-10-CM | POA: Insufficient documentation

## 2016-04-21 DIAGNOSIS — D649 Anemia, unspecified: Secondary | ICD-10-CM | POA: Insufficient documentation

## 2016-04-21 DIAGNOSIS — F17210 Nicotine dependence, cigarettes, uncomplicated: Secondary | ICD-10-CM | POA: Insufficient documentation

## 2016-04-21 NOTE — ED Triage Notes (Signed)
Pt has issues with ulcers for the past couples years and had a flare up on Saturday and was seen at [facility].  On Saturday pt told [facility] that she also had dark stools.  Pt states she was dx with anemia, hypokalemia.  Pt reports her abd pain is worse today and feels very tired. Pt a/o x 4 and ambulatory.

## 2016-06-07 ENCOUNTER — Encounter (HOSPITAL_COMMUNITY): Payer: Self-pay | Admitting: *Deleted

## 2016-06-07 ENCOUNTER — Emergency Department (HOSPITAL_COMMUNITY)
Admission: EM | Admit: 2016-06-07 | Discharge: 2016-06-07 | Disposition: A | Discharge status: Home / Self Care | Payer: Managed Care, Other (non HMO) | Attending: Emergency Medicine | Admitting: Emergency Medicine

## 2016-06-07 DIAGNOSIS — J45909 Unspecified asthma, uncomplicated: Secondary | ICD-10-CM | POA: Insufficient documentation

## 2016-06-07 DIAGNOSIS — R1013 Epigastric pain: Secondary | ICD-10-CM | POA: Insufficient documentation

## 2016-06-07 DIAGNOSIS — Z79899 Other long term (current) drug therapy: Secondary | ICD-10-CM | POA: Insufficient documentation

## 2016-06-07 DIAGNOSIS — F17210 Nicotine dependence, cigarettes, uncomplicated: Secondary | ICD-10-CM | POA: Insufficient documentation

## 2016-06-07 LAB — URINALYSIS, ROUTINE W REFLEX MICROSCOPIC
Bacteria, UA: NONE SEEN
Bilirubin Urine: NEGATIVE
Glucose, UA: NEGATIVE mg/dL
Ketones, ur: NEGATIVE mg/dL
Leukocytes, UA: NEGATIVE
Nitrite: NEGATIVE
Protein, ur: NEGATIVE mg/dL
RBC / HPF: NONE SEEN RBC/hpf (ref 0–5)
Specific Gravity, Urine: 1.014 (ref 1.005–1.030)
pH: 5 (ref 5.0–8.0)

## 2016-06-07 LAB — COMPREHENSIVE METABOLIC PANEL
ALT: 9 U/L — ABNORMAL LOW (ref 14–54)
AST: 21 U/L (ref 15–41)
Albumin: 4.1 g/dL (ref 3.5–5.0)
Alkaline Phosphatase: 56 U/L (ref 38–126)
Anion gap: 9 (ref 5–15)
BUN: 7 mg/dL (ref 6–20)
CO2: 22 mmol/L (ref 22–32)
Calcium: 9 mg/dL (ref 8.9–10.3)
Chloride: 106 mmol/L (ref 101–111)
Creatinine, Ser: 0.6 mg/dL (ref 0.44–1.00)
GFR calc Af Amer: >60 mL/min (ref >60)
GFR calc non Af Amer: >60 mL/min (ref >60)
Glucose, Bld: 79 mg/dL (ref 65–99)
Potassium: 3.6 mmol/L (ref 3.5–5.1)
Sodium: 137 mmol/L (ref 135–145)
Total Bilirubin: 0.5 mg/dL (ref 0.3–1.2)
Total Protein: 7.1 g/dL (ref 6.5–8.1)

## 2016-06-07 LAB — CBC
HEMATOCRIT: 29.3 % — AB (ref 36.0–46.0)
Hemoglobin: 9.3 g/dL — ABNORMAL LOW (ref 12.0–15.0)
MCH: 22.8 pg — AB (ref 26.0–34.0)
MCHC: 31.7 g/dL (ref 30.0–36.0)
MCV: 71.8 fL — AB (ref 78.0–100.0)
Platelets: 482 10*3/uL — ABNORMAL HIGH (ref 150–400)
RBC: 4.08 MIL/uL (ref 3.87–5.11)
RDW: 22.3 % — AB (ref 11.5–15.5)
WBC: 5.6 10*3/uL (ref 4.0–10.5)

## 2016-06-07 LAB — LIPASE, BLOOD: Lipase: 26 U/L (ref 11–51)

## 2016-06-07 LAB — POC OCCULT BLOOD, ED: FECAL OCCULT BLD: NEGATIVE

## 2016-06-07 MED ORDER — SUCRALFATE 1 G PO TABS: 1.0000 g | ORAL_TABLET | Freq: Four times a day (QID) | ORAL | 0 refills | Status: DC

## 2016-06-07 MED ORDER — SUCRALFATE 1 G PO TABS
1.0000 g | ORAL_TABLET | Freq: Once | ORAL | Status: AC
  Administered 2016-06-07: 1 g via ORAL
  Filled 2016-06-07: qty 1

## 2016-06-07 MED ORDER — GI COCKTAIL ~~LOC~~
30.0000 mL | Freq: Once | ORAL | Status: AC
  Administered 2016-06-07: 30 mL via ORAL
  Filled 2016-06-07: qty 30

## 2016-06-07 NOTE — Discharge Instructions (Signed)
Your stool today did not have any signs of blood in it. Follow-up with the gastroenterologist and return here if you begin to vomit blood

## 2016-06-07 NOTE — ED Provider Notes (Signed)
[facility]-EMERGENCY DEPT Provider Note   CSN: [id number] Arrival date & time: 06/07/16  [id number]     History   Chief Complaint Chief Complaint  Patient presents with  . Abdominal Pain  . Melena    HPI Jenny Evans is a 45 y.o. female.  45 year old with history of peptic ulcer disease presents with worsening epigastric pain and gets worse after she eats. Denies any hematemesis. States that the pain does not radiate to her back and has not been associated fever but has noticed some dark stools without blood. She takes Nexium which has not relieved her symptoms. Had endoscopy 1 year ago which is when she was diagnosed. Denies any weakness at this time. Does smoke daily but denies any alcohol use. Nothing makes her symptoms better      Past Medical History:  Diagnosis Date  . Anemia   . Asthma   . Chronic pain   . GERD (gastroesophageal reflux disease)   . Migraine   . Sickle cell trait (HCC)   . Ulcer [facility](HCC)     Patient Active Problem List   Diagnosis Date Noted  . Blood loss anemia 11/12/2015  . Melena 11/12/2015  . Nonspecific abnormal electrocardiogram (ECG) (EKG) 04/03/2015  . Palpitations 04/03/2015  . Neck pain 04/03/2015  . Colitis 07/10/2014  . Absolute anemia   . Post-operative state 08/26/2013  . Rectocele 08/26/2013  . Anal or rectal pain 07/29/2013  . Headache(784.0) 02/27/2012  . ACUTE BRONCHITIS 03/25/2010  . OBESITY 09/06/2009  . INSOMNIA 01/23/2009  . HYPOKALEMIA 11/01/2008  . DYS[facility]PSIA&OTHER [facility] DISORDERS FUNCTION STOMACH 06/07/2008  . ALLERGIC RHINITIS 03/23/2008  . CONSTIPATION 01/20/2008  . SHORTNESS OF BREATH 08/27/2007  . TOBACCO ABUSE 03/09/2007  . Migraine 03/09/2007  . ABSCESS, TOOTH 03/09/2007  . GERD (gastroesophageal reflux disease) 03/09/2007  . HEADACHE, CLUSTER 02/25/2007  . ECZEMA 02/25/2007    Past Surgical History:  Procedure Laterality Date  . ESOPHAGOGASTRODUODENOSCOPY (EGD) WITH PROPOFOL N/A 04/04/2013   Procedure:  ESOPHAGOGASTRODUODENOSCOPY (EGD) WITH PROPOFOL;  Surgeon: Willis ModenaWilliam Outlaw, MD;  Location: [facility];  Service: Endoscopy;  Laterality: N/A;  . EXAMINATION UNDER ANESTHESIA N/A 08/10/2013   Procedure: RECTAL EXAM UNDER ANESTHESIA;  Surgeon: Clovis Puhomas A. Cornett, MD;  Location: [facility];  Service: General;  Laterality: N/A;  ligation  . HEMORRHOID SURGERY    . MYRINGOTOMY     age 45    OB History    No data available       Home Medications    Prior to Admission medications   Medication Sig Start Date End Date Taking? Authorizing Provider  albuterol (PROVENTIL HFA;VENTOLIN HFA) 108 (90 Base) MCG/ACT inhaler Inhale 1-2 puffs into the lungs every 6 (six) hours as needed for wheezing. 03/15/16   Rolland PorterMark James, MD  benzonatate (TESSALON) 100 MG capsule Take 1 capsule (100 mg total) by mouth every 8 (eight) hours. 03/03/16   Samantha Tripp Dowless, PA-C  cyclobenzaprine (FLEXERIL) 10 MG tablet Take 1 tablet (10 mg total) by mouth 3 (three) times daily as needed for muscle spasms. Patient not taking: Reported on 03/03/2016 01/05/16   Geoffery Lyonsouglas Delo, MD  dextromethorphan-guaiFENesin [facility](MUCINEX DM) 30-600 MG 12hr tablet Take 1 tablet by mouth 2 (two) times daily. 03/03/16   Samantha Tripp Dowless, PA-C  doxycycline (VIBRAMYCIN) 100 MG capsule Take 1 capsule (100 mg total) by mouth 2 (two) times daily. 03/15/16   Rolland PorterMark James, MD  Esomeprazole Magnesium (NEXIUM 24HR) 20 MG TBEC Take 20 mg by mouth daily as needed (  for heartburn.).    Historical Provider, MD  ferrous sulfate 325 (65 FE) MG tablet Take 1 tablet (325 mg total) by mouth 2 (two) times daily with a meal. 04/19/16   Cathren LaineKevin Steinl, MD  fluticasone (FLONASE) 50 MCG/ACT nasal spray Place 2 sprays into both nostrils daily. Patient not taking: Reported on 11/12/2015 03/29/15   Garlon HatchetLisa M Sanders, PA-C  guaiFENesin-codeine (CHERATUSSIN AC) 100-10 MG/5ML syrup Take 5 mLs by mouth 3 (three) times daily as needed for cough. 03/15/16   Rolland PorterMark James, MD    ibuprofen (ADVIL,MOTRIN) 600 MG tablet Take 1 tablet (600 mg total) by mouth every 6 (six) hours as needed. 04/13/16   Roxy Horsemanobert Browning, PA-C  naproxen (NAPROSYN) 500 MG tablet Take 1 tablet (500 mg total) by mouth 2 (two) times daily. Patient not taking: Reported on 03/03/2016 01/05/16   Geoffery Lyonsouglas Delo, MD  pantoprazole (PROTONIX) 20 MG tablet Take 1 tablet (20 mg total) by mouth daily. Patient not taking: Reported on 01/04/2016 11/16/15   Rolm GalaStevi Barrett, PA-C  pantoprazole (PROTONIX) 40 MG tablet Take 1 tablet (40 mg total) by mouth daily. 04/19/16   Cathren LaineKevin Steinl, MD  predniSONE (DELTASONE) 20 MG tablet Take 1 tablet (20 mg total) by mouth 2 (two) times daily with a meal. 03/15/16   Rolland PorterMark James, MD  sucralfate (CARAFATE) 1 GM/10ML suspension Take 10 mLs (1 g total) by mouth 4 (four) times daily -  with meals and at bedtime. Patient not taking: Reported on 01/04/2016 11/16/15   Alveta HeimlichStevi Barrett, PA-C    Family History Family History  Problem Relation Age of Onset  . Heart disease Mother     Arrythmia  . Kidney disease Mother   . Hypertension Mother   . COPD Mother   . Diabetes Mother   . Heart disease Father   . Crohn's disease Maternal Aunt     Social History Social History  Substance Use Topics  . Smoking status: Current Some Day Smoker    Packs/day: 0.25    Years: 15.00    Types: Cigarettes  . Smokeless tobacco: Never Used  . Alcohol use Yes     Comment: very rare     Allergies   Hydrocodone-acetaminophen   Review of Systems Review of Systems  All other systems reviewed and are negative.    Physical Exam Updated Vital Signs BP 103/84 (BP Location: Left Arm)   Pulse 85   Temp 98.5 F (36.9 C) (Oral)   Resp 16   Ht 5\' 4"  (1.626 m)   Wt 59 kg   LMP 05/17/2016 (Approximate)   SpO2 100%   BMI 22.31 kg/m   Physical Exam  Constitutional: She is oriented to person, place, and time. She appears well-developed and well-nourished.  Non-toxic appearance. No distress.  HENT:   Head: Normocephalic and atraumatic.  Eyes: Conjunctivae, EOM and lids are normal. Pupils are equal, round, and reactive to light.  Neck: Normal range of motion. Neck supple. No tracheal deviation present. No thyroid mass present.  Cardiovascular: Normal rate, regular rhythm and normal heart sounds.  Exam reveals no gallop.   No murmur heard. Pulmonary/Chest: Effort normal and breath sounds normal. No stridor. No respiratory distress. She has no decreased breath sounds. She has no wheezes. She has no rhonchi. She has no rales.  Abdominal: Soft. Normal appearance and bowel sounds are normal. She exhibits no distension. There is no tenderness. There is no rebound and no CVA tenderness.  Musculoskeletal: Normal range of motion. She exhibits no edema or  tenderness.  Neurological: She is alert and oriented to person, place, and time. She has normal strength. No cranial nerve deficit or sensory deficit. GCS eye subscore is 4. GCS verbal subscore is 5. GCS motor subscore is 6.  Skin: Skin is warm and dry. No abrasion and no rash noted.  Psychiatric: She has a normal mood and affect. Her speech is normal and behavior is normal.  Nursing note and vitals reviewed.    ED Treatments / Results  Labs (all labs ordered are listed, but only abnormal results are displayed) Labs Reviewed  COMPREHENSIVE METABOLIC PANEL - Abnormal; Notable for the following:       Result Value   ALT 9 (*)    All other components within normal limits  CBC - Abnormal; Notable for the following:    Hemoglobin 9.3 (*)    HCT 29.3 (*)    MCV 71.8 (*)    MCH 22.8 (*)    RDW 22.3 (*)    Platelets 482 (*)    All other components within normal limits  URINALYSIS, ROUTINE W REFLEX MICROSCOPIC - Abnormal; Notable for the following:    Hgb urine dipstick SMALL (*)    Squamous Epithelial / LPF 0-5 (*)    All other components within normal limits  LIPASE, BLOOD  POC OCCULT BLOOD, ED    EKG  EKG Interpretation None        Radiology No results found.  Procedures Procedures (including critical care time)  Medications Ordered in ED Medications - No data to display   Initial Impression / Assessment and Plan / ED Course  I have reviewed the triage vital signs and the nursing notes.  Pertinent labs & imaging results that were available during my care of the patient were reviewed by me and considered in my medical decision making (see chart for details).  Clinical Course     Patient given Carafate and GI cocktail here. Will treat for peptic ulcer disease can give GI referral  Final Clinical Impressions(s) / ED Diagnoses   Final diagnoses:  None    New Prescriptions New Prescriptions   No medications on file     Lorre Nick, MD 06/07/16 [phone]

## 2016-06-07 NOTE — ED Triage Notes (Signed)
Pt stated "I have hx of ulcers.  I was seeing Dr. Parke SimmersBland but don't go there anymore.  About a week ago I started having belly pain.  I was taking Nexium to help calm it down.  My stools are black."

## 2016-07-21 ENCOUNTER — Emergency Department (HOSPITAL_BASED_OUTPATIENT_CLINIC_OR_DEPARTMENT_OTHER): Payer: Self-pay

## 2016-07-21 ENCOUNTER — Encounter (HOSPITAL_BASED_OUTPATIENT_CLINIC_OR_DEPARTMENT_OTHER): Payer: Self-pay

## 2016-07-21 ENCOUNTER — Emergency Department (HOSPITAL_BASED_OUTPATIENT_CLINIC_OR_DEPARTMENT_OTHER)
Admission: EM | Admit: 2016-07-21 | Discharge: 2016-07-21 | Disposition: A | Discharge status: Home / Self Care | Payer: Self-pay | Attending: Emergency Medicine | Admitting: Emergency Medicine

## 2016-07-21 DIAGNOSIS — J45909 Unspecified asthma, uncomplicated: Secondary | ICD-10-CM | POA: Insufficient documentation

## 2016-07-21 DIAGNOSIS — K5900 Constipation, unspecified: Secondary | ICD-10-CM | POA: Insufficient documentation

## 2016-07-21 DIAGNOSIS — D508 Other iron deficiency anemias: Secondary | ICD-10-CM | POA: Insufficient documentation

## 2016-07-21 DIAGNOSIS — F17210 Nicotine dependence, cigarettes, uncomplicated: Secondary | ICD-10-CM | POA: Insufficient documentation

## 2016-07-21 DIAGNOSIS — R0789 Other chest pain: Secondary | ICD-10-CM | POA: Insufficient documentation

## 2016-07-21 DIAGNOSIS — R109 Unspecified abdominal pain: Secondary | ICD-10-CM

## 2016-07-21 LAB — CBC WITH DIFFERENTIAL/PLATELET
Basophils Absolute: 0 10*3/uL (ref 0.0–0.1)
Basophils Relative: 0 %
Eosinophils Absolute: 0.2 10*3/uL (ref 0.0–0.7)
Eosinophils Relative: 2 %
HEMATOCRIT: 23.7 % — AB (ref 36.0–46.0)
Hemoglobin: 7.5 g/dL — ABNORMAL LOW (ref 12.0–15.0)
LYMPHS ABS: 2.8 10*3/uL (ref 0.7–4.0)
LYMPHS PCT: 37 %
MCH: 21.8 pg — AB (ref 26.0–34.0)
MCHC: 31.6 g/dL (ref 30.0–36.0)
MCV: 68.9 fL — AB (ref 78.0–100.0)
MONOS PCT: 10 %
Monocytes Absolute: 0.7 10*3/uL (ref 0.1–1.0)
Neutro Abs: 3.8 10*3/uL (ref 1.7–7.7)
Neutrophils Relative %: 51 %
Platelets: 477 10*3/uL — ABNORMAL HIGH (ref 150–400)
RBC: 3.44 MIL/uL — AB (ref 3.87–5.11)
RDW: 18.7 % — ABNORMAL HIGH (ref 11.5–15.5)
WBC: 7.5 10*3/uL (ref 4.0–10.5)

## 2016-07-21 LAB — PREGNANCY, URINE: Preg Test, Ur: NEGATIVE

## 2016-07-21 LAB — URINALYSIS, ROUTINE W REFLEX MICROSCOPIC
BILIRUBIN URINE: NEGATIVE
GLUCOSE, UA: NEGATIVE mg/dL
HGB URINE DIPSTICK: NEGATIVE
Ketones, ur: NEGATIVE mg/dL
Leukocytes, UA: NEGATIVE
Nitrite: NEGATIVE
Protein, ur: NEGATIVE mg/dL
SPECIFIC GRAVITY, URINE: 1.016 (ref 1.005–1.030)
pH: 6.5 (ref 5.0–8.0)

## 2016-07-21 LAB — BASIC METABOLIC PANEL
ANION GAP: 8 (ref 5–15)
BUN: 7 mg/dL (ref 6–20)
CO2: 24 mmol/L (ref 22–32)
Calcium: 9.1 mg/dL (ref 8.9–10.3)
Chloride: 105 mmol/L (ref 101–111)
Creatinine, Ser: 0.48 mg/dL (ref 0.44–1.00)
GFR calc Af Amer: >60 mL/min (ref >60)
GFR calc non Af Amer: >60 mL/min (ref >60)
GLUCOSE: 96 mg/dL (ref 65–99)
POTASSIUM: 3.1 mmol/L — AB (ref 3.5–5.1)
Sodium: 137 mmol/L (ref 135–145)

## 2016-07-21 LAB — TROPONIN I: Troponin I: <0.03 ng/mL (ref <0.03)

## 2016-07-21 LAB — OCCULT BLOOD X 1 CARD TO LAB, STOOL: Fecal Occult Bld: NEGATIVE

## 2016-07-21 MED ORDER — PANTOPRAZOLE SODIUM 40 MG IV SOLR
40.0000 mg | Freq: Once | INTRAVENOUS | Status: AC
  Administered 2016-07-21: 40 mg via INTRAVENOUS
  Filled 2016-07-21: qty 40

## 2016-07-21 MED ORDER — GI COCKTAIL ~~LOC~~
30.0000 mL | Freq: Once | ORAL | Status: AC
  Administered 2016-07-21: 30 mL via ORAL
  Filled 2016-07-21: qty 30

## 2016-07-21 MED ORDER — POTASSIUM CHLORIDE CRYS ER 20 MEQ PO TBCR
40.0000 meq | EXTENDED_RELEASE_TABLET | Freq: Once | ORAL | Status: AC
  Administered 2016-07-21: 40 meq via ORAL
  Filled 2016-07-21: qty 2

## 2016-07-21 MED ORDER — SUCRALFATE 1 GM/10ML PO SUSP: 1.0000 g | Freq: Three times a day (TID) | ORAL | 0 refills | Status: DC

## 2016-07-21 MED ORDER — SODIUM CHLORIDE 0.9 % IV BOLUS (SEPSIS)
1000.0000 mL | Freq: Once | INTRAVENOUS | Status: AC
  Administered 2016-07-21: 1000 mL via INTRAVENOUS

## 2016-07-21 NOTE — ED Provider Notes (Signed)
[facility]   CSN: [id number] Arrival date & time: 07/21/16  1243  By signing my name below, I, Clovis Pu, attest that this documentation has been prepared under the direction and in the presence of Charlynne Pander, MD  Electronically Signed: Clovis Pu, ED Scribe. 07/21/16. 6:07 PM.   History   Chief Complaint Chief Complaint  Patient presents with  . Chest Pain   The history is provided by the patient. No language interpreter was used.   HPI Comments:  Jenny Evans is a 46 y.o. female, with a hx of GERD and stomach ulcers, who presents to the Emergency Department complaining of intremittenet central chest pain onset yesterday. Pt also reports upper abdominal pain. She notes she took laxatives, stool softeners and had 2 enemas because she was constipated x 1 week. She reports she last had a bowel movement 1 week ago. Pt is on Nexid and reports she takes [employer]. Pt denies melena, taking Carafate and any other associated symptoms.   Past Medical History:  Diagnosis Date  . Anemia   . Asthma   . Chronic pain   . GERD (gastroesophageal reflux disease)   . Migraine   . Sickle cell trait (HCC)   . Ulcer [facility])     Patient Active Problem List   Diagnosis Date Noted  . Blood loss anemia 11/12/2015  . Melena 11/12/2015  . Nonspecific abnormal electrocardiogram (ECG) (EKG) 04/03/2015  . Palpitations 04/03/2015  . Neck pain 04/03/2015  . Colitis 07/10/2014  . Absolute anemia   . Post-operative state 08/26/2013  . Rectocele 08/26/2013  . Anal or rectal pain 07/29/2013  . Headache(784.0) 02/27/2012  . ACUTE BRONCHITIS 03/25/2010  . OBESITY 09/06/2009  . INSOMNIA 01/23/2009  . HYPOKALEMIA 11/01/2008  . DYSPEPSIA&OTHER [facility] DISORDERS FUNCTION STOMACH 06/07/2008  . ALLERGIC RHINITIS 03/23/2008  . CONSTIPATION 01/20/2008  . SHORTNESS OF BREATH 08/27/2007  . TOBACCO ABUSE 03/09/2007  . Migraine 03/09/2007  . ABSCESS, TOOTH 03/09/2007    . GERD (gastroesophageal reflux disease) 03/09/2007  . HEADACHE, CLUSTER 02/25/2007  . ECZEMA 02/25/2007    Past Surgical History:  Procedure Laterality Date  . ESOPHAGOGASTRODUODENOSCOPY (EGD) WITH PROPOFOL N/A 04/04/2013   Procedure: ESOPHAGOGASTRODUODENOSCOPY (EGD) WITH PROPOFOL;  Surgeon: Willis Modena, MD;  Location: [facility];  Service: Endoscopy;  Laterality: N/A;  . EXAMINATION UNDER ANESTHESIA N/A 08/10/2013   Procedure: RECTAL EXAM UNDER ANESTHESIA;  Surgeon: Clovis Pu. Cornett, MD;  Location: [facility];  Service: General;  Laterality: N/A;  ligation  . HEMORRHOID SURGERY    . MYRINGOTOMY     age 81    OB History    No data available       Home Medications    Prior to Admission medications   Medication Sig Start Date End Date Taking? Authorizing Provider  sucralfate (CARAFATE) 1 GM/10ML suspension Take 10 mLs (1 g total) by mouth 4 (four) times daily -  with meals and at bedtime. Patient not taking: Reported on 01/04/2016 11/16/15   Alveta Heimlich, PA-C    Family History Family History  Problem Relation Age of Onset  . Heart disease Mother     Arrythmia  . Kidney disease Mother   . Hypertension Mother   . COPD Mother   . Diabetes Mother   . Heart disease Father   . Crohn's disease Maternal Aunt     Social History Social History  Substance Use Topics  . Smoking status: Current Some Day Smoker  Packs/day: 0.25    Years: 15.00    Types: Cigarettes  . Smokeless tobacco: Never Used  . Alcohol use No     Allergies   Hydrocodone-acetaminophen   Review of Systems Review of Systems  Cardiovascular: Positive for chest pain.  Gastrointestinal: Positive for constipation. Negative for blood in stool.  All other systems reviewed and are negative.  Physical Exam Updated Vital Signs BP 101/58   Pulse 73   Temp 98.2 F (36.8 C) (Oral)   Resp 18   Ht 5\' 4"  (1.626 m)   Wt 130 lb (59 kg)   LMP 07/14/2016 Comment: pregnancy test  performed  SpO2 100%   BMI 22.31 kg/m   Physical Exam  Constitutional: She is oriented to person, place, and time. She appears well-developed and well-nourished. No distress.  HENT:  Head: Normocephalic and atraumatic.  Eyes: EOM are normal.  Conjunctivae is pale   Neck: Normal range of motion.  Cardiovascular: Normal rate, regular rhythm and normal heart sounds.   Pulmonary/Chest: Effort normal and breath sounds normal.  Abdominal: Soft. She exhibits no distension. There is no tenderness.  No reproducible tenderness   Genitourinary:  Genitourinary Comments: No hemorrhoids. Brown stool.   Musculoskeletal: Normal range of motion.  Neurological: She is alert and oriented to person, place, and time.  Skin: Skin is warm and dry.  Psychiatric: She has a normal mood and affect. Judgment normal.  Nursing note and vitals reviewed.  ED Treatments / Results  DIAGNOSTIC STUDIES:  Oxygen Saturation is 100% on RA, normal by my interpretation.    COORDINATION OF CARE:  6:05 PM Discussed treatment plan with pt at bedside and pt agreed to plan.  Labs (all labs ordered are listed, but only abnormal results are displayed) Labs Reviewed  CBC WITH DIFFERENTIAL/PLATELET - Abnormal; Notable for the following:       Result Value   RBC 3.44 (*)    Hemoglobin 7.5 (*)    HCT 23.7 (*)    MCV 68.9 (*)    MCH 21.8 (*)    RDW 18.7 (*)    Platelets 477 (*)    All other components within normal limits  BASIC METABOLIC PANEL - Abnormal; Notable for the following:    Potassium 3.1 (*)    All other components within normal limits  TROPONIN I  OCCULT BLOOD X 1 CARD TO LAB, STOOL  URINALYSIS, ROUTINE W REFLEX MICROSCOPIC  TROPONIN I  PREGNANCY, URINE    EKG  EKG Interpretation  Date/Time:  Monday July 21 2016 16:41:02 EST Ventricular Rate:  91 PR Interval:  140 QRS Duration: 76 QT Interval:  366 QTC Calculation: 450 R Axis:   79 Text Interpretation:  Sinus rhythm with Premature  supraventricular complexes Otherwise normal ECG No significant change since last tracing Confirmed by YAO  MD, DAVID ([id number]) on 07/21/2016 5:54:34 PM       Radiology Dg Chest 2 View  Result Date: 07/21/2016 CLINICAL DATA:  46 year old with current history of sickle cell trait, presenting with two-day history of central chest pain and headache. Current history of asthma. EXAM: CHEST  2 VIEW COMPARISON:  03/15/2016, 11/12/2015 and earlier. FINDINGS: Cardiac silhouette upper normal in size to slightly enlarged, unchanged. Hilar and mediastinal contours otherwise unremarkable. Mild central peribronchial thickening, unchanged. Lungs otherwise clear. No localized airspace consolidation. No pleural effusions. No pneumothorax. Normal pulmonary vascularity. Slight thoracic dextroscoliosis as noted previously. IMPRESSION: Stable mild changes of chronic bronchitis and/or asthma. No acute cardiopulmonary disease. Electronically Signed  By: Hulan Saashomas  Lawrence M.D.   On: 07/21/2016 19:29   Dg Abd 2 Views  Result Date: 07/21/2016 CLINICAL DATA:  46 year old with current history of sickle cell trait presenting with a 2 day history of abdominal distention and pain. EXAM: ABDOMEN - 2 VIEW COMPARISON:  11/16/2015, 11/12/2015 and earlier, including CT abdomen and pelvis 07/09/2014. FINDINGS: Gas within upper normal caliber loops of jejunum in the left upper quadrant demonstrating scattered air-fluid levels on the erect image. Gas and expected stool burden throughout normal caliber colon from cecum to rectum. No evidence of free air on the erect image. No visible opaque urinary tract calculi. Phlebolith in the right side of the pelvis. Regional skeleton intact. IMPRESSION: 1. Ileus and/or enteritis involving the jejunum in the left upper quadrant. 2. No evidence of bowel obstruction or free intraperitoneal air. Electronically Signed   By: Hulan Saashomas  Lawrence M.D.   On: 07/21/2016 19:31    Procedures Procedures (including  critical care time)  Medications Ordered in ED Medications  gi cocktail (Maalox,Lidocaine,Donnatal) (30 mLs Oral Given 07/21/16 1822)  sodium chloride 0.9 % bolus 1,000 mL (1,000 mLs Intravenous New Bag/Given 07/21/16 1833)  pantoprazole (PROTONIX) injection 40 mg (40 mg Intravenous Given 07/21/16 1833)     Initial Impression / Assessment and Plan / ED Course  I have reviewed the triage vital signs and the nursing notes.  Pertinent labs & imaging results that were available during my care of the patient were reviewed by me and considered in my medical decision making (see chart for details).     Jenny Evans is a 46 y.o. female here with constipation, chest pain. Has hx of stomach ulcer and has been taking goody powder. Consider ACS vs GI bleed. Will get labs, occ blood, trop x 2, CXR.   7:54 PM Hg 7.5, was 9 2 months ago but previous to that has been around 7.5-7.7. Occ negative. Not hypotensive or tachycardic. I think likely chronic iron deficiency anemia. She is already on iron. Has hx of stomach ulcer and told her to take nexium daily and carafate again. Will refer her to GI. Will have her see hematology as she may need iron transfusion regularly. Trop neg x 2 and she is not symptomatic from the anemia so doesn't need emergent transfusion    Final Clinical Impressions(s) / ED Diagnoses   Final diagnoses:  Abdominal pain    New Prescriptions New Prescriptions   No medications on file  I personally performed the services described in this documentation, which was scribed in my presence. The recorded information has been reviewed and is accurate.     Charlynne Panderavid Hsienta Yao, MD 07/21/16 [phone]

## 2016-07-21 NOTE — ED Notes (Signed)
ED Provider at bedside. 

## 2016-07-21 NOTE — ED Notes (Signed)
ekg shown to dr. Clarene DukeLittle, states pt is ok to await bed in waiting room.

## 2016-07-21 NOTE — ED Triage Notes (Signed)
CP x 2 days-NAD-steady gait 

## 2016-07-21 NOTE — Discharge Instructions (Signed)
Continue taking iron pills.   Take nexium 40 mg daily  Take carafate as prescribed.   Avoid motrin or goody powders.   See GI doctor for follow up   You are anemic and will need to see hematology. You may need iron or blood transfusions regularly   Return to ER if you have worse chest pain, shortness of breath, abdominal pain, black stools.

## 2016-07-21 NOTE — ED Notes (Signed)
Patient called for blood draws, no answer.

## 2016-07-30 ENCOUNTER — Encounter (HOSPITAL_COMMUNITY): Payer: Self-pay | Admitting: Emergency Medicine

## 2016-07-30 ENCOUNTER — Emergency Department (HOSPITAL_COMMUNITY): Payer: Managed Care, Other (non HMO)

## 2016-07-30 ENCOUNTER — Emergency Department (HOSPITAL_COMMUNITY)
Admission: EM | Admit: 2016-07-30 | Discharge: 2016-07-31 | Disposition: A | Discharge status: Home / Self Care | Payer: Managed Care, Other (non HMO) | Attending: Emergency Medicine | Admitting: Emergency Medicine

## 2016-07-30 DIAGNOSIS — R0789 Other chest pain: Secondary | ICD-10-CM | POA: Insufficient documentation

## 2016-07-30 DIAGNOSIS — R42 Dizziness and giddiness: Secondary | ICD-10-CM

## 2016-07-30 DIAGNOSIS — R079 Chest pain, unspecified: Secondary | ICD-10-CM

## 2016-07-30 DIAGNOSIS — D649 Anemia, unspecified: Secondary | ICD-10-CM | POA: Insufficient documentation

## 2016-07-30 DIAGNOSIS — F17210 Nicotine dependence, cigarettes, uncomplicated: Secondary | ICD-10-CM | POA: Insufficient documentation

## 2016-07-30 DIAGNOSIS — I499 Cardiac arrhythmia, unspecified: Secondary | ICD-10-CM | POA: Insufficient documentation

## 2016-07-30 DIAGNOSIS — Z79899 Other long term (current) drug therapy: Secondary | ICD-10-CM | POA: Insufficient documentation

## 2016-07-30 DIAGNOSIS — J45909 Unspecified asthma, uncomplicated: Secondary | ICD-10-CM | POA: Insufficient documentation

## 2016-07-30 LAB — URINALYSIS, ROUTINE W REFLEX MICROSCOPIC
BILIRUBIN URINE: NEGATIVE
Glucose, UA: NEGATIVE mg/dL
Hgb urine dipstick: NEGATIVE
KETONES UR: NEGATIVE mg/dL
Leukocytes, UA: NEGATIVE
NITRITE: NEGATIVE
Protein, ur: NEGATIVE mg/dL
Specific Gravity, Urine: 1.013 (ref 1.005–1.030)
pH: 6 (ref 5.0–8.0)

## 2016-07-30 LAB — PROTIME-INR
INR: 1.08
Prothrombin Time: 14 seconds (ref 11.4–15.2)

## 2016-07-30 LAB — TYPE AND SCREEN
ABO/RH(D): A POS
Antibody Screen: POSITIVE
DAT, IgG: NEGATIVE

## 2016-07-30 LAB — CBC
HCT: 23.3 % — ABNORMAL LOW (ref 36.0–46.0)
Hemoglobin: 7.5 g/dL — ABNORMAL LOW (ref 12.0–15.0)
MCH: 21.8 pg — ABNORMAL LOW (ref 26.0–34.0)
MCHC: 32.2 g/dL (ref 30.0–36.0)
MCV: 67.7 fL — ABNORMAL LOW (ref 78.0–100.0)
Platelets: 459 10*3/uL — ABNORMAL HIGH (ref 150–400)
RBC: 3.44 MIL/uL — AB (ref 3.87–5.11)
RDW: 19 % — ABNORMAL HIGH (ref 11.5–15.5)
WBC: 8.1 10*3/uL (ref 4.0–10.5)

## 2016-07-30 LAB — I-STAT TROPONIN, ED
TROPONIN I, POC: 0 ng/mL (ref 0.00–0.08)
TROPONIN I, POC: 0 ng/mL (ref 0.00–0.08)

## 2016-07-30 LAB — BASIC METABOLIC PANEL
ANION GAP: 10 (ref 5–15)
BUN: 10 mg/dL (ref 6–20)
CALCIUM: 8.8 mg/dL — AB (ref 8.9–10.3)
CO2: 21 mmol/L — AB (ref 22–32)
Chloride: 105 mmol/L (ref 101–111)
Creatinine, Ser: 0.77 mg/dL (ref 0.44–1.00)
Glucose, Bld: 87 mg/dL (ref 65–99)
Potassium: 2.7 mmol/L — CL (ref 3.5–5.1)
Sodium: 136 mmol/L (ref 135–145)

## 2016-07-30 LAB — D-DIMER, QUANTITATIVE (NOT AT ARMC)

## 2016-07-30 LAB — POC URINE PREG, ED: PREG TEST UR: NEGATIVE

## 2016-07-30 MED ORDER — POTASSIUM CHLORIDE CRYS ER 20 MEQ PO TBCR
40.0000 meq | EXTENDED_RELEASE_TABLET | Freq: Once | ORAL | Status: AC
  Administered 2016-07-30: 40 meq via ORAL
  Filled 2016-07-30: qty 2

## 2016-07-30 MED ORDER — SODIUM CHLORIDE 0.9 % IV BOLUS (SEPSIS)
1000.0000 mL | Freq: Once | INTRAVENOUS | Status: AC
  Administered 2016-07-30: 1000 mL via INTRAVENOUS

## 2016-07-30 NOTE — ED Provider Notes (Signed)
[facility]-EMERGENCY DEPT Provider Note   CSN: [id number] Arrival date & time: 07/30/16  1540     History   Chief Complaint Chief Complaint  Patient presents with  . Chest Pain    HPI Jenny Evans is a 46 y.o. female with a past medical history significant for asthma, GERD,  Gastric ulcers, and prior anemia who presents with severe fatigue, lightheadedness, and chest pain. Patient reports that she has a history of anemia requiring blood transfusions. She reports that it is due to "iron levels" and ulcer problems. Patient says that  Asked week, she had similar symptoms and went to [facility] where she was evaluated. At that time, patient said her hemoglobin was in the sevens but she did not feel this bad. She was subsequently discharged home with plans to follow-up. She says that over the last few days, her symptoms have acutely worsened. She says that she is now having constant chest pressure that gets up to a 9 out of 10 in severity. She says it is exertional in nature and is worse when she is working. She says that she has associated shortness of breath with it. She reports lightheadedness and severe fatigue. She denies syncope but reports that she has to rest. She reports that she smokes cigarettes and has a family history of early heart disease in her mother. She denies recent chest trauma, fevers, chills, congestion, cough, rhinorrhea, constipation, diarrhea, or urinary changes. She denies dark tarry stools however she reports she has had these in the past with her GI bleeds. She does report that she's been taking ibuprofen/Motrin for the chest pain. He reports that the chest pain does not radiate and is located in her central chest. It is pressure-like and associated with nausea but no vomiting.      HPI  Past Medical History:  Diagnosis Date  . Anemia   . Asthma   . Chronic pain   . GERD (gastroesophageal reflux disease)   . Migraine   . Sickle cell trait (HCC)   . Ulcer  [facility](HCC)     Patient Active Problem List   Diagnosis Date Noted  . Blood loss anemia 11/12/2015  . Melena 11/12/2015  . Nonspecific abnormal electrocardiogram (ECG) (EKG) 04/03/2015  . Palpitations 04/03/2015  . Neck pain 04/03/2015  . Colitis 07/10/2014  . Absolute anemia   . Post-operative state 08/26/2013  . Rectocele 08/26/2013  . Anal or rectal pain 07/29/2013  . Headache(784.0) 02/27/2012  . ACUTE BRONCHITIS 03/25/2010  . OBESITY 09/06/2009  . INSOMNIA 01/23/2009  . HYPOKALEMIA 11/01/2008  . [facility] FUN[facility]OM[facility] 06/07/2008  . ALLERGIC RHINITIS 03/23/2008  . CONSTIPATION 01/20/2008  . SHORTNESS OF BREATH 08/27/2007  . TOBACCO ABUSE 03/09/2007  . Migraine 03/09/2007  . ABSCESS, TOOTH 03/09/2007  . GERD (gastroesophageal reflux disease) 03/09/2007  . HEADACHE, CLUSTER 02/25/2007  . ECZEMA 02/25/2007    Past Surgical History:  Procedure Laterality Date  . ESOPHAGOGASTRODUODENOSCOPY (EGD) WITH PROPOFOL N/A 04/04/2013   Procedure: ESOPHAGOGASTRODUODENOSCOPY (EGD) WITH PROPOFOL;  Surgeon: Willis ModenaWilliam Outlaw, MD;  Location: [facility];  Service: Endoscopy;  Laterality: N/A;  . EXAMINATION UNDER ANESTHESIA N/A 08/10/2013   Procedure: RECTAL EXAM UNDER ANESTHESIA;  Surgeon: Clovis Puhomas A. Cornett, MD;  Location: [facility];  Service: General;  Laterality: N/A;  ligation  . HEMORRHOID SURGERY    . MYRINGOTOMY     age 46    OB History    No data available  Home Medications    Prior to Admission medications   Medication Sig Start Date End Date Taking? Authorizing Provider  sucralfate (CARAFATE) 1 GM/10ML suspension Take 10 mLs (1 g total) by mouth 4 (four) times daily -  with meals and at bedtime. 07/21/16   Charlynne Pander, MD    Family History Family History  Problem Relation Age of Onset  . Heart disease Mother     Arrythmia  . Kidney disease Mother   . Hypertension Mother   . COPD Mother   . Diabetes Mother   . Heart  disease Father   . Crohn's disease Maternal Aunt     Social History Social History  Substance Use Topics  . Smoking status: Current Some Day Smoker    Packs/day: 0.25    Years: 15.00    Types: Cigarettes  . Smokeless tobacco: Never Used  . Alcohol use No     Allergies   Hydrocodone-acetaminophen   Review of Systems Review of Systems  Constitutional: Positive for fatigue. Negative for activity change, chills, diaphoresis and fever.  HENT: Negative for congestion and rhinorrhea.   Eyes: Negative for visual disturbance.  Respiratory: Negative for cough, chest tightness, shortness of breath and stridor.   Cardiovascular: Positive for chest pain. Negative for palpitations and leg swelling.  Gastrointestinal: Positive for nausea. Negative for abdominal distention, abdominal pain, constipation, diarrhea and vomiting.  Genitourinary: Negative for difficulty urinating, dysuria, flank pain, frequency, hematuria, menstrual problem, pelvic pain, vaginal bleeding and vaginal discharge.  Musculoskeletal: Negative for back pain and neck pain.  Skin: Negative for rash and wound.  Neurological: Positive for light-headedness. Negative for dizziness, syncope, weakness, numbness and headaches.  Psychiatric/Behavioral: Negative for agitation and confusion.  All other systems reviewed and are negative.    Physical Exam Updated Vital Signs BP 103/55 (BP Location: Left Arm)   Pulse 103   Temp 98 F (36.7 C) (Oral)   Resp 18   LMP 07/14/2016 Comment: pregnancy test performed  SpO2 100%   Physical Exam  Constitutional: She appears well-developed and well-nourished. No distress.  HENT:  Head: Normocephalic and atraumatic.  Mouth/Throat: Oropharynx is clear and moist. No oropharyngeal exudate.  Eyes: Conjunctivae are normal. Pupils are equal, round, and reactive to light.  Neck: Normal range of motion. Neck supple.  Cardiovascular: An irregular rhythm present. Tachycardia present.   Murmur  heard. Pulmonary/Chest: Effort normal and breath sounds normal. No stridor. No respiratory distress. She has no wheezes. She exhibits no tenderness.  Abdominal: Soft. There is no tenderness.  Musculoskeletal: She exhibits no edema.  Neurological: She is alert. No sensory deficit. She exhibits normal muscle tone.  Skin: Skin is warm and dry. Capillary refill takes less than 2 seconds. No rash noted. She is not diaphoretic. No erythema.  Psychiatric: She has a normal mood and affect.  Nursing note and vitals reviewed.    ED Treatments / Results  Labs (all labs ordered are listed, but only abnormal results are displayed) Labs Reviewed  BASIC METABOLIC PANEL - Abnormal; Notable for the following:       Result Value   Potassium 2.7 (*)    CO2 21 (*)    Calcium 8.8 (*)    All other components within normal limits  CBC - Abnormal; Notable for the following:    RBC 3.44 (*)    Hemoglobin 7.5 (*)    HCT 23.3 (*)    MCV 67.7 (*)    MCH 21.8 (*)    RDW 19.0 (*)  Platelets 459 (*)    All other components within normal limits  URINALYSIS, ROUTINE W REFLEX MICROSCOPIC - Abnormal; Notable for the following:    Color, Urine STRAW (*)    All other components within normal limits  D-DIMER, QUANTITATIVE (NOT AT [facility])  PROTIME-INR  I-STAT TROPOININ, ED  POC URINE PREG, ED  I-STAT TROPOININ, ED  TYPE AND SCREEN    EKG  EKG Interpretation  Date/Time:  Wednesday July 30 2016 15:51:26 EST Ventricular Rate:  102 PR Interval:    QRS Duration: 75 QT Interval:  367 QTC Calculation: 479 R Axis:   85 Text Interpretation:  Sinus tachycardia Multiform ventricular premature complexes Probable left atrial enlargement When compared to prior, no significant changes were seen.  No STEMI Confirmed by Rush Landmark MD, CHRISTOPHER [phone]) on 07/30/2016 5:16:20 PM       Radiology Dg Chest 2 View  Result Date: 07/30/2016 CLINICAL DATA:  Known anemia, mid chest pain and upper abdominal pain. History  of asthma. Current smoker. EXAM: CHEST  2 VIEW COMPARISON:  Chest x-ray of July 21, 2016 FINDINGS: The lungs are adequately inflated. There is no focal infiltrate or pleural effusion. The heart and pulmonary vascularity are normal. The mediastinum is normal in width. The bony thorax is unremarkable. The gas pattern in the upper abdomen appears normal. IMPRESSION: Borderline hyperinflation consistent with the history of asthma. No pneumonia, CHF, nor other acute cardiopulmonary abnormality. Electronically Signed   By: David  [place] M.D.   On: 07/30/2016 16:33    Procedures Procedures (including critical care time)  Medications Ordered in ED Medications  potassium chloride SA (K-DUR,KLOR-CON) CR tablet 40 mEq (40 mEq Oral Given 07/30/16 1857)  sodium chloride 0.9 % bolus 1,000 mL (0 mLs Intravenous Stopped 07/30/16 2109)     Initial Impression / Assessment and Plan / ED Course  I have reviewed the triage vital signs and the nursing notes.  Pertinent labs & imaging results that were available during my care of the patient were reviewed by me and considered in my medical decision making (see chart for details).     Jenny Evans is a 46 y.o. female with a past medical history significant for asthma, GERD,  Gastric ulcers, and prior anemia who presents with severe fatigue, lightheadedness, and chest pain.  History and exam are seen above.   On exam, patient's lungs are clear. No wheezing. Patient's chest is nontender, abdomen is nontender. She has an irregular pulse with no murmurs. She has no focal neurologic deficits.  Based on symptoms, suspect symptomatic anemia causing her fatigue, lightheadedness, and her chest pressure. Patient will have laboratory testing. Due to tachycardia and soft blood pressure, patient given fluids during initial workup. Patient will have a type and screen sent as well as workup for occult infection.  PT found to have Hgb unchanged at 7.5 from prior. Patient  given fluids with improvement Troponin negative times two, D dimer negative, Patient found to have hypokalemia at 2.7, oral potassium was supplemented. Chest x-ray reveals no pneumonia.  Patient was observed for a period of  Time. Patient was offered admission due to possibility that she is having symptomatic anemia as cause of her Lightheadedness, chest pressure, and tachycardia however, patient does Not want to stay and was to go home. As patient denies having new bleeding, has been unchanged hemoglobin, and has resolution of symptoms currently feel patient is appropriate for discharge.   Patient will follow up with her PCP and her cardiologist. Patient understood extremely  strict return precautions. Patient voiced understanding of the plan of care and was discharged in good condition.    Final Clinical Impressions(s) / ED Diagnoses   Final diagnoses:  Chest pain, unspecified type  Lightheaded  Anemia, unspecified type    New Prescriptions Discharge Medication List as of 07/30/2016 11:53 PM      Clinical Impression: 1. Chest pain, unspecified type   2. Lightheaded   3. Anemia, unspecified type     Disposition: Discharge  Condition: Good  I have discussed the results, Dx and Tx plan with the pt(& family if present). He/she/they expressed understanding and agree(s) with the plan. Discharge instructions discussed at great length. Strict return precautions discussed and pt &/or family have verbalized understanding of the instructions. No further questions at time of discharge.    Discharge Medication List as of 07/30/2016 11:53 PM      Follow Up: Renaye Rakers, MD [address] [place] [address] [phone]     [facility] [address] [place] [id number] [address] [place] [address] [phone]  If symptoms worsen     Heide Scales, MD 07/31/16 1149

## 2016-07-30 NOTE — Discharge Instructions (Signed)
Today, we found that you have a low but stable red blood cell count. This may be the cause of your lightheadedness, fatigue, and chest pain. After rehydration, although you felt better and had improvement in symptoms, we discussed the possibility of admission for further management. At this time, he did not want to stay however, if symptoms worsen or persist, please return to the nearest emergency department for further evaluation.

## 2016-07-30 NOTE — ED Triage Notes (Signed)
Per pt, states [facility]-states her hgb was low t that time and was told she needed transfusion but she didn't want to be admitted-having same symptoms today

## 2016-07-31 NOTE — ED Notes (Signed)
Pt ambulatory and independent at discharge.  Verbalized understanding of discharge instructions 

## 2016-08-20 ENCOUNTER — Encounter (HOSPITAL_BASED_OUTPATIENT_CLINIC_OR_DEPARTMENT_OTHER): Payer: Self-pay | Admitting: Emergency Medicine

## 2016-08-20 ENCOUNTER — Emergency Department (HOSPITAL_BASED_OUTPATIENT_CLINIC_OR_DEPARTMENT_OTHER)
Admission: EM | Admit: 2016-08-20 | Discharge: 2016-08-20 | Disposition: A | Discharge status: Home / Self Care | Payer: Managed Care, Other (non HMO) | Attending: Emergency Medicine | Admitting: Emergency Medicine

## 2016-08-20 DIAGNOSIS — F17210 Nicotine dependence, cigarettes, uncomplicated: Secondary | ICD-10-CM | POA: Insufficient documentation

## 2016-08-20 DIAGNOSIS — Z79899 Other long term (current) drug therapy: Secondary | ICD-10-CM | POA: Insufficient documentation

## 2016-08-20 DIAGNOSIS — J45909 Unspecified asthma, uncomplicated: Secondary | ICD-10-CM | POA: Insufficient documentation

## 2016-08-20 DIAGNOSIS — D649 Anemia, unspecified: Secondary | ICD-10-CM

## 2016-08-20 LAB — COMPREHENSIVE METABOLIC PANEL
ALK PHOS: 60 U/L (ref 38–126)
ALT: 9 U/L — ABNORMAL LOW (ref 14–54)
ANION GAP: 9 (ref 5–15)
AST: 18 U/L (ref 15–41)
Albumin: 4 g/dL (ref 3.5–5.0)
BUN: 8 mg/dL (ref 6–20)
CALCIUM: 9.1 mg/dL (ref 8.9–10.3)
CO2: 24 mmol/L (ref 22–32)
Chloride: 105 mmol/L (ref 101–111)
Creatinine, Ser: 0.58 mg/dL (ref 0.44–1.00)
GFR calc Af Amer: >60 mL/min (ref >60)
GFR calc non Af Amer: >60 mL/min (ref >60)
GLUCOSE: 83 mg/dL (ref 65–99)
Potassium: 3.4 mmol/L — ABNORMAL LOW (ref 3.5–5.1)
SODIUM: 138 mmol/L (ref 135–145)
TOTAL PROTEIN: 7.1 g/dL (ref 6.5–8.1)
Total Bilirubin: 0.3 mg/dL (ref 0.3–1.2)

## 2016-08-20 LAB — CBC WITH DIFFERENTIAL/PLATELET
BASOS ABS: 0 10*3/uL (ref 0.0–0.1)
BASOS PCT: 0 %
EOS ABS: 0 10*3/uL (ref 0.0–0.7)
Eosinophils Relative: 0 %
HEMATOCRIT: 23.1 % — AB (ref 36.0–46.0)
Hemoglobin: 7.3 g/dL — ABNORMAL LOW (ref 12.0–15.0)
LYMPHS ABS: 1.7 10*3/uL (ref 0.7–4.0)
LYMPHS PCT: 26 %
MCH: 21.2 pg — AB (ref 26.0–34.0)
MCHC: 31.6 g/dL (ref 30.0–36.0)
MCV: 67.2 fL — ABNORMAL LOW (ref 78.0–100.0)
MONOS PCT: 8 %
Monocytes Absolute: 0.5 10*3/uL (ref 0.1–1.0)
NEUTROS ABS: 4.5 10*3/uL (ref 1.7–7.7)
Neutrophils Relative %: 66 %
Platelets: 451 10*3/uL — ABNORMAL HIGH (ref 150–400)
RBC: 3.44 MIL/uL — ABNORMAL LOW (ref 3.87–5.11)
RDW: 18.2 % — AB (ref 11.5–15.5)
WBC: 6.7 10*3/uL (ref 4.0–10.5)

## 2016-08-20 LAB — LIPASE, BLOOD: Lipase: 21 U/L (ref 11–51)

## 2016-08-20 LAB — OCCULT BLOOD X 1 CARD TO LAB, STOOL: Fecal Occult Bld: NEGATIVE

## 2016-08-20 MED ORDER — SODIUM CHLORIDE 0.9 % IV BOLUS (SEPSIS)
1000.0000 mL | Freq: Once | INTRAVENOUS | Status: AC
  Administered 2016-08-20: 1000 mL via INTRAVENOUS

## 2016-08-20 MED ORDER — GI COCKTAIL ~~LOC~~
30.0000 mL | Freq: Once | ORAL | Status: AC
  Administered 2016-08-20: 30 mL via ORAL
  Filled 2016-08-20: qty 30

## 2016-08-20 NOTE — ED Triage Notes (Signed)
Pt states she is here because she had diarrhea today. When she wiped she saw some bright red blood.  Pt has history of anemia, gi bleed and transfusion.

## 2016-08-20 NOTE — ED Provider Notes (Signed)
[facility]   CSN: [id number] Arrival date & time: 08/20/16  0934     History   Chief Complaint Chief Complaint  Patient presents with  . Diarrhea    HPI Jenny Evans is a 46 y.o. female.  Patient is a 46 year old female with past medical history of anemia, gastric ulcers. Patient presents here for evaluation of  diarrhea several days that is now tinged with blood. She reports some pain in her epigastric area, but denies abdominal pain otherwise. She denies any fevers or chills.   She tells me she had an endoscopy several years ago which revealed ulcers. She had been treated with Nexium and Carafate in the past.   The history is provided by the patient.  Diarrhea   This is a recurrent problem. The current episode started 2 days ago. The problem has not changed since onset.There has been no fever. She has tried nothing for the symptoms.    Past Medical History:  Diagnosis Date  . Anemia   . Asthma   . Chronic pain   . GERD (gastroesophageal reflux disease)   . Migraine   . Sickle cell trait (HCC)   . Ulcer [facility])     Patient Active Problem List   Diagnosis Date Noted  . Blood loss anemia 11/12/2015  . Melena 11/12/2015  . Nonspecific abnormal electrocardiogram (ECG) (EKG) 04/03/2015  . Palpitations 04/03/2015  . Neck pain 04/03/2015  . Colitis 07/10/2014  . Absolute anemia   . Post-operative state 08/26/2013  . Rectocele 08/26/2013  . Anal or rectal pain 07/29/2013  . Headache(784.0) 02/27/2012  . ACUTE BRONCHITIS 03/25/2010  . OBESITY 09/06/2009  . INSOMNIA 01/23/2009  . HYPOKALEMIA 11/01/2008  . DYSPEPSIA&OTHER [facility] DISORDERS FUNCTION STOMACH 06/07/2008  . ALLERGIC RHINITIS 03/23/2008  . CONSTIPATION 01/20/2008  . SHORTNESS OF BREATH 08/27/2007  . TOBACCO ABUSE 03/09/2007  . Migraine 03/09/2007  . ABSCESS, TOOTH 03/09/2007  . GERD (gastroesophageal reflux disease) 03/09/2007  . HEADACHE, CLUSTER 02/25/2007  . ECZEMA  02/25/2007    Past Surgical History:  Procedure Laterality Date  . ESOPHAGOGASTRODUODENOSCOPY (EGD) WITH PROPOFOL N/A 04/04/2013   Procedure: ESOPHAGOGASTRODUODENOSCOPY (EGD) WITH PROPOFOL;  Surgeon: Willis Modena, MD;  Location[facility];  Service: Endoscopy;  Laterality: N/A;  . EXAMINATION UNDER ANESTHESIA N/A 08/10/2013   Procedure: RECTAL EXAM UNDER ANESTHESIA;  Surgeon: Clovis Pu. Cornett, MD;  Location: [facility];  Service: General;  Laterality: N/A;  ligation  . HEMORRHOID SURGERY    . MYRINGOTOMY     age 38    OB History    No data available       Home Medications    Prior to Admission medications   Medication Sig Start Date End Date Taking? Authorizing Provider  esomeprazole (NEXIUM) 20 MG capsule Take 20-40 mg by mouth daily at 12 noon.    Historical Provider, MD  ibuprofen (ADVIL,MOTRIN) 200 MG tablet Take 200 mg by mouth every 6 (six) hours as needed.    Historical Provider, MD  sucralfate (CARAFATE) 1 GM/10ML suspension Take 10 mLs (1 g total) by mouth 4 (four) times daily -  with meals and at bedtime. Patient not taking: Reported on 07/30/2016 07/21/16   Charlynne Pander, MD    Family History Family History  Problem Relation Age of Onset  . Heart disease Mother     Arrythmia  . Kidney disease Mother   . Hypertension Mother   . COPD Mother   . Diabetes Mother   .  Heart disease Father   . Crohn's disease Maternal Aunt     Social History Social History  Substance Use Topics  . Smoking status: Current Some Day Smoker    Packs/day: 0.25    Years: 15.00    Types: Cigarettes  . Smokeless tobacco: Never Used  . Alcohol use No     Allergies   Hydrocodone-acetaminophen   Review of Systems Review of Systems  Gastrointestinal: Positive for diarrhea.  All other systems reviewed and are negative.    Physical Exam Updated Vital Signs BP 108/76 (BP Location: Left Arm)   Pulse 94   Temp 97.7 F (36.5 C) (Oral)   Resp 18   Ht 5'  4" (1.626 m)   Wt 135 lb (61.2 kg)   SpO2 100%   BMI 23.17 kg/m   Physical Exam  Constitutional: She is oriented to person, place, and time. She appears well-developed and well-nourished. No distress.  HENT:  Head: Normocephalic and atraumatic.  Neck: Normal range of motion. Neck supple.  Cardiovascular: Normal rate and regular rhythm.  Exam reveals no gallop and no friction rub.   No murmur heard. Pulmonary/Chest: Effort normal and breath sounds normal. No respiratory distress. She has no wheezes.  Abdominal: Soft. Bowel sounds are normal. She exhibits no distension. There is no tenderness.  Musculoskeletal: Normal range of motion.  Neurological: She is alert and oriented to person, place, and time.  Skin: Skin is warm and dry. She is not diaphoretic.  Nursing note and vitals reviewed.    ED Treatments / Results  Labs (all labs ordered are listed, but only abnormal results are displayed) Labs Reviewed  COMPREHENSIVE METABOLIC PANEL  CBC WITH DIFFERENTIAL/PLATELET  LIPASE, BLOOD    EKG  EKG Interpretation None       Radiology No results found.  Procedures Procedures (including critical care time)  Medications Ordered in ED Medications  sodium chloride 0.9 % bolus 1,000 mL (not administered)  gi cocktail (Maalox,Lidocaine,Donnatal) (not administered)     Initial Impression / Assessment and Plan / ED Course  I have reviewed the triage vital signs and the nursing notes.  Pertinent labs & imaging results that were available during my care of the patient were reviewed by me and considered in my medical decision making (see chart for details).  Patient is a 46 year old female with history of chronic anemia and stomach ulcers. She presents today with complaints of weakness and fatigue. She had a loose bowel movement today. When wiping she noticed a streak of blood on the toilet paper. She has no hemorrhoids on rectal exam and what small stool I could obtain was  heme-negative.  Laboratory study results reveal a hemoglobin of 7.3 which is consistent with her previous 2 results of 7.5.  I've discussed this with Dr. Waymon AmatoHongalgi from the hospitalist service who does not feel as though this patient requires admission. He is recommending iron supplementation and outpatient follow-up for repeat laboratory studies in the next few days.  Final Clinical Impressions(s) / ED Diagnoses   Final diagnoses:  None    New Prescriptions New Prescriptions   No medications on file     Geoffery Lyonsouglas Roshonda Sperl, MD 08/20/16 1330

## 2016-08-20 NOTE — Discharge Instructions (Signed)
Iron sulfate supplements 325 mg twice daily.  You're to follow-up with your primary Dr. in the next 3-4 days for a repeat hemoglobin.  Return to the emergency department for severe abdominal pain, high fevers, worsening bleeding, or other new and concerning symptoms.

## 2016-10-30 IMAGING — CT CT ABD-PELV W/ CM
2 of 5 series · 16 of 46 positions shown, 18 images · IV contrast (APPLIED)
Comparison: 03/14/2013

CLINICAL DATA: Right lower quadrant abdominal pain beginning 3 days
ago. Bloody and watery diarrhea. Nausea.

EXAM:
CT ABDOMEN AND PELVIS WITH CONTRAST
TECHNIQUE: Multidetector CT imaging of the abdomen and pelvis was performed
using the standard protocol following bolus administration of
intravenous contrast.
CONTRAST:  25mL OMNIPAQUE IOHEXOL 300 MG/ML SOLN, 100mL OMNIPAQUE
IOHEXOL 300 MG/ML SOLN

[Series 2: abd/pelvis 5.0 b31f · axial · 0.69mm/px · z∈[-494,-104]mm · 13 of 88 slices shown, 15 images]
[im 5/88  soft-tissue]
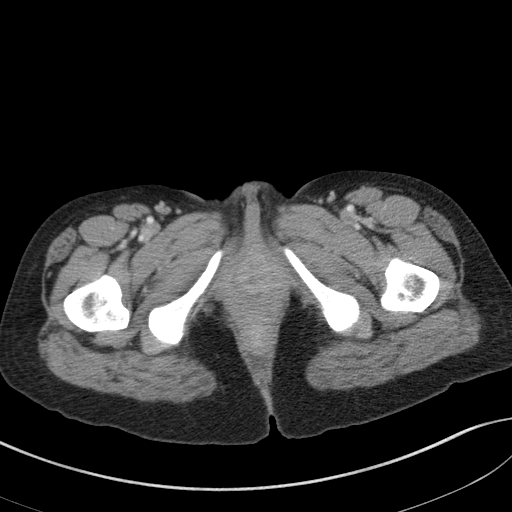
[im 5/88  bone]
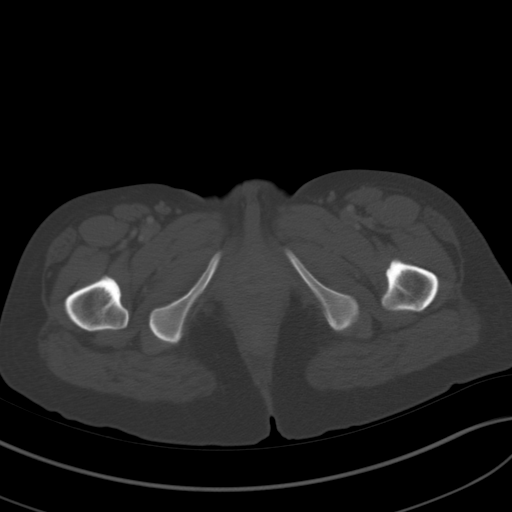
[im 14/88  soft-tissue]
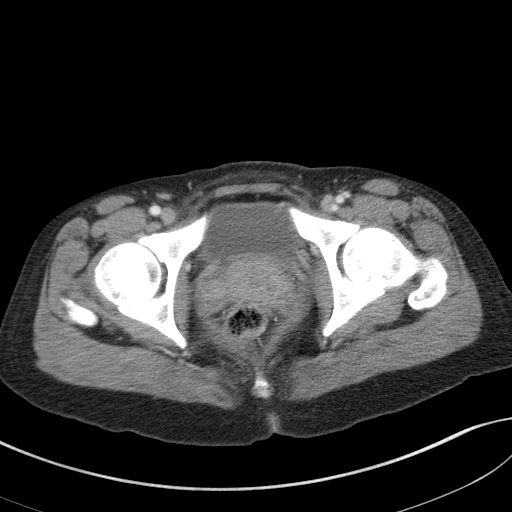
[im 19/88  soft-tissue]
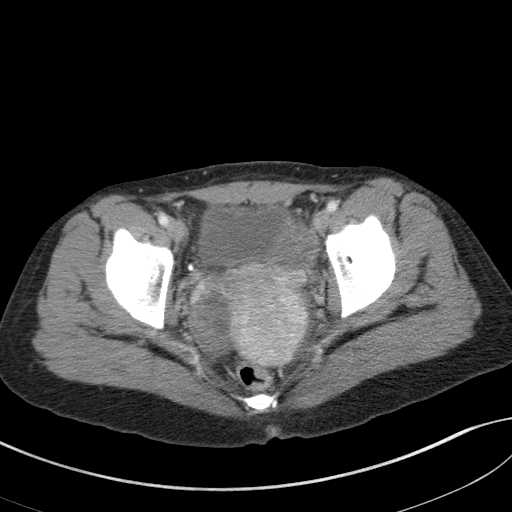
[im 23/88  soft-tissue]
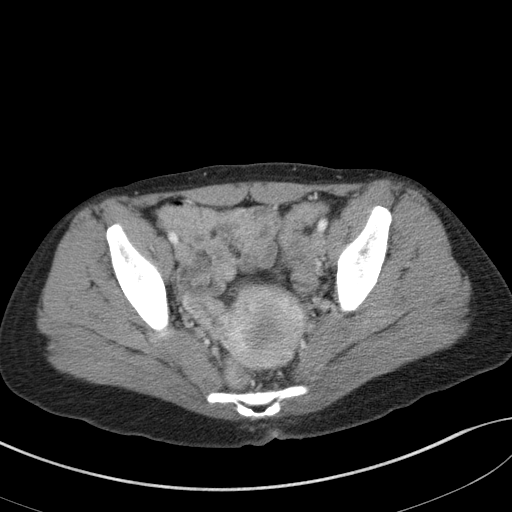
[im 33/88  soft-tissue]
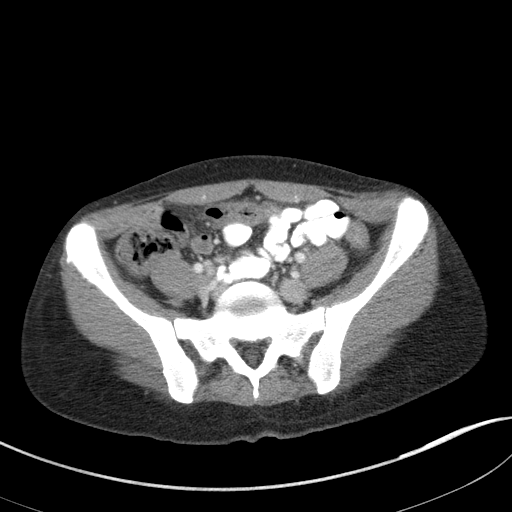
[im 37/88  soft-tissue]
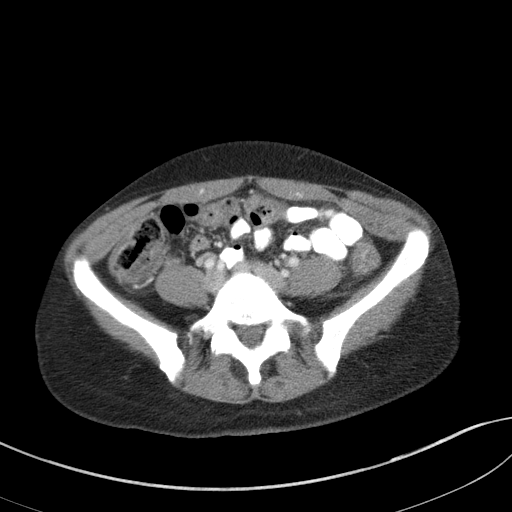
[im 46/88  soft-tissue]
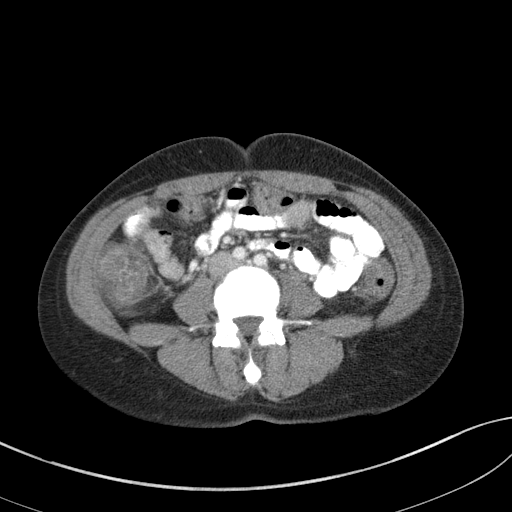
[im 51/88  soft-tissue]
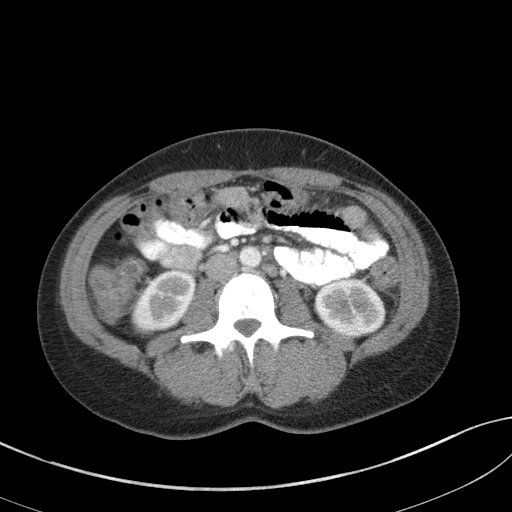
[im 55/88  soft-tissue]
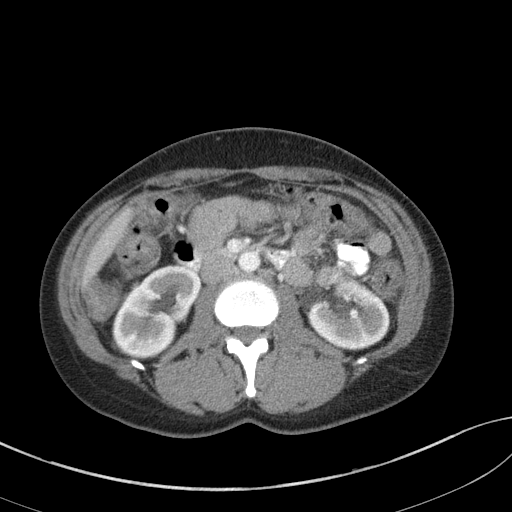
[im 55/88  bone]
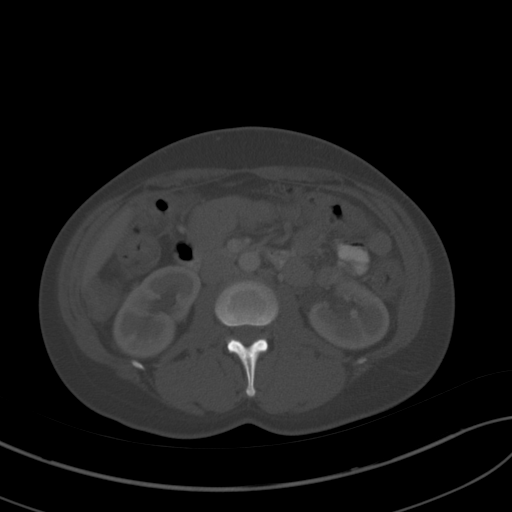
[im 65/88  soft-tissue]
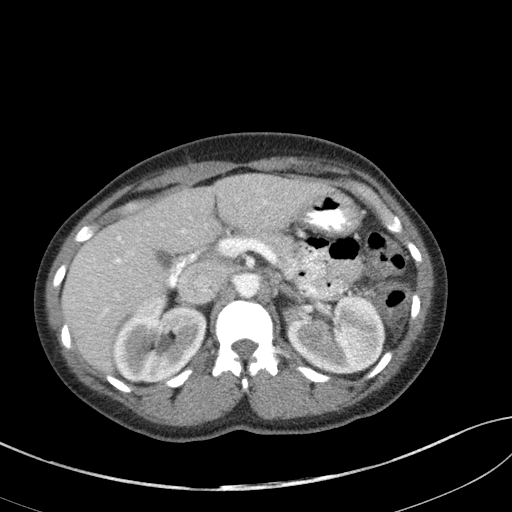
[im 69/88  soft-tissue]
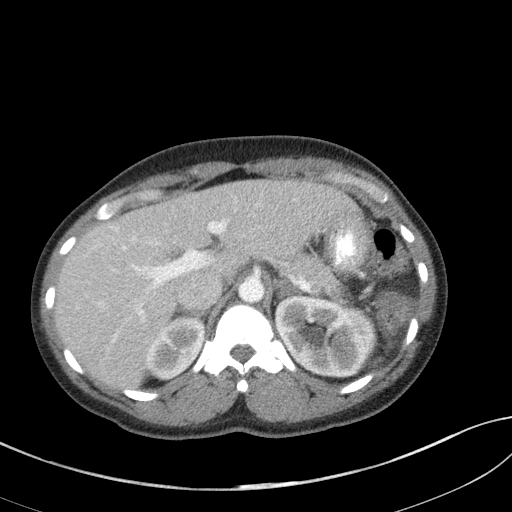
[im 74/88  soft-tissue]
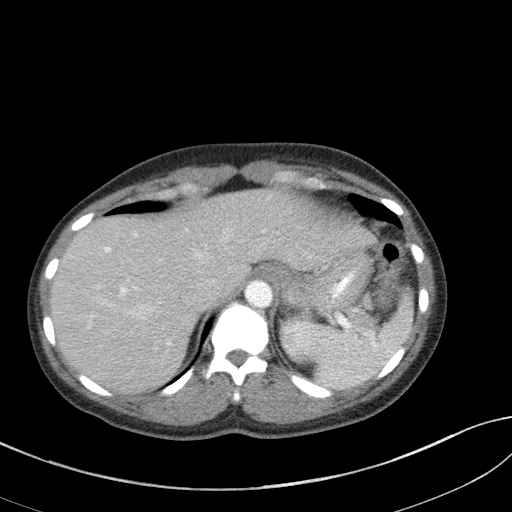
[im 83/88  soft-tissue]
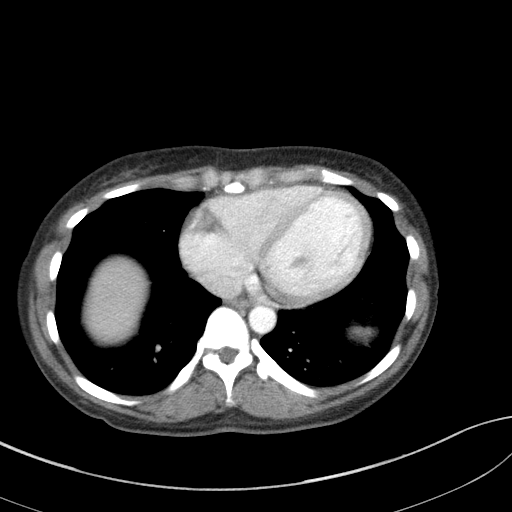

[Series 5: abd/pelvis 3.0 coronal · coronal · 0.71mm/px · 3 of 66 slices shown]
[im 22/66  soft-tissue]
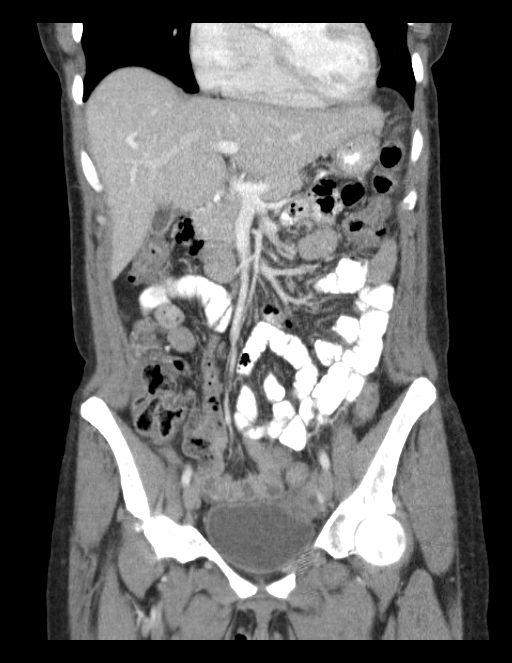
[im 29/66  soft-tissue]
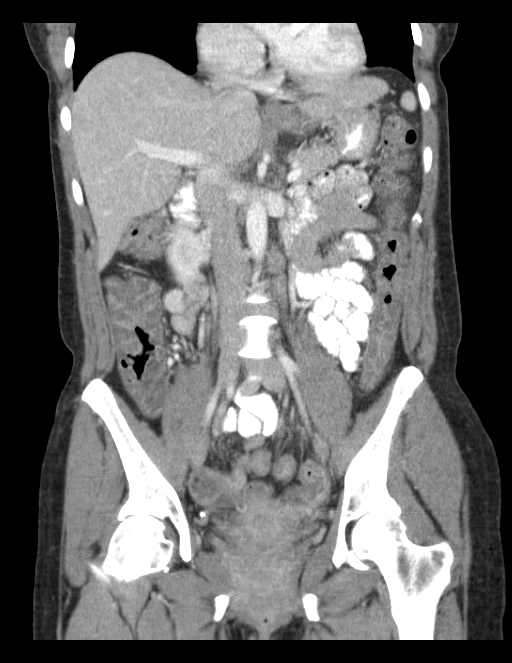
[im 37/66  soft-tissue]
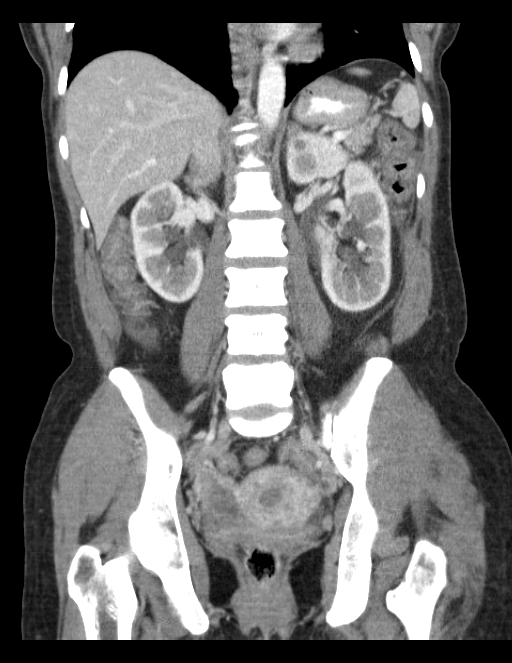

[16 of 46 positions shown; findings below may reference images not displayed]

FINDINGS: Minimal dependent atelectasis is present in the lung bases. The
liver, gallbladder, spleen, adrenal glands, kidneys, and pancreas
have an unremarkable enhanced appearance.

There is no evidence of bowel obstruction. There is moderate,
diffuse wall thickening involving the cecum and ascending colon with
mild surrounding inflammatory fat stranding. The transverse and
descending colon are underdistended, however there also appears to
be diffuse wall thickening involving these segments as well. Mild
inflammatory stranding is present about the descending colon. The
appendix is mildly prominent proximally, measuring up to 10 mm in
diameter, however it appears completely decompressed more distally
without significant inflammation seen immediately adjacent to it and
acute appendicitis is felt unlikely.

Subcentimeter retroperitoneal lymph nodes are stable to minimally
more prominent than on the prior study. There is trace free fluid in
the pelvis. No gross pelvic mass is identified. No acute osseous
abnormality is identified.
IMPRESSION: Relatively diffuse colonic wall thickening, greatest in the
ascending colon and consistent with colitis.

## 2016-11-04 ENCOUNTER — Encounter (HOSPITAL_COMMUNITY): Payer: Self-pay | Admitting: Emergency Medicine

## 2016-11-04 ENCOUNTER — Emergency Department (HOSPITAL_COMMUNITY)
Admission: EM | Admit: 2016-11-04 | Discharge: 2016-11-04 | Disposition: A | Discharge status: Home / Self Care | Payer: Managed Care, Other (non HMO) | Attending: Emergency Medicine | Admitting: Emergency Medicine

## 2016-11-04 DIAGNOSIS — J45909 Unspecified asthma, uncomplicated: Secondary | ICD-10-CM | POA: Insufficient documentation

## 2016-11-04 DIAGNOSIS — R112 Nausea with vomiting, unspecified: Secondary | ICD-10-CM | POA: Insufficient documentation

## 2016-11-04 DIAGNOSIS — D509 Iron deficiency anemia, unspecified: Secondary | ICD-10-CM | POA: Insufficient documentation

## 2016-11-04 DIAGNOSIS — F17210 Nicotine dependence, cigarettes, uncomplicated: Secondary | ICD-10-CM | POA: Insufficient documentation

## 2016-11-04 DIAGNOSIS — R1013 Epigastric pain: Secondary | ICD-10-CM | POA: Insufficient documentation

## 2016-11-04 LAB — COMPREHENSIVE METABOLIC PANEL
ALBUMIN: 3.7 g/dL (ref 3.5–5.0)
ALK PHOS: 53 U/L (ref 38–126)
ALT: 8 U/L — AB (ref 14–54)
ANION GAP: 7 (ref 5–15)
AST: 18 U/L (ref 15–41)
BILIRUBIN TOTAL: 0.3 mg/dL (ref 0.3–1.2)
BUN: 7 mg/dL (ref 6–20)
CALCIUM: 9 mg/dL (ref 8.9–10.3)
CO2: 23 mmol/L (ref 22–32)
Chloride: 108 mmol/L (ref 101–111)
Creatinine, Ser: 0.69 mg/dL (ref 0.44–1.00)
GFR calc Af Amer: >60 mL/min (ref >60)
GLUCOSE: 93 mg/dL (ref 65–99)
Potassium: 3.1 mmol/L — ABNORMAL LOW (ref 3.5–5.1)
Sodium: 138 mmol/L (ref 135–145)
Total Protein: 6.3 g/dL — ABNORMAL LOW (ref 6.5–8.1)

## 2016-11-04 LAB — CBC WITH DIFFERENTIAL/PLATELET
Basophils Absolute: 0.1 10*3/uL (ref 0.0–0.1)
Basophils Relative: 1 %
EOS PCT: 2 %
Eosinophils Absolute: 0.1 10*3/uL (ref 0.0–0.7)
HEMATOCRIT: 21.9 % — AB (ref 36.0–46.0)
HEMOGLOBIN: 6.3 g/dL — AB (ref 12.0–15.0)
LYMPHS PCT: 22 %
Lymphs Abs: 1.2 10*3/uL (ref 0.7–4.0)
MCH: 18.9 pg — AB (ref 26.0–34.0)
MCHC: 28.8 g/dL — ABNORMAL LOW (ref 30.0–36.0)
MCV: 65.6 fL — AB (ref 78.0–100.0)
MONO ABS: 0.5 10*3/uL (ref 0.1–1.0)
MONOS PCT: 10 %
NEUTROS PCT: 65 %
Neutro Abs: 3.4 10*3/uL (ref 1.7–7.7)
Platelets: 585 10*3/uL — ABNORMAL HIGH (ref 150–400)
RBC: 3.34 MIL/uL — ABNORMAL LOW (ref 3.87–5.11)
RDW: 19.4 % — ABNORMAL HIGH (ref 11.5–15.5)
WBC: 5.3 10*3/uL (ref 4.0–10.5)

## 2016-11-04 LAB — IRON AND TIBC
IRON: 7 ug/dL — AB (ref 28–170)
Saturation Ratios: 2 % — ABNORMAL LOW (ref 10.4–31.8)
TIBC: 455 ug/dL — AB (ref 250–450)
UIBC: 448 ug/dL

## 2016-11-04 LAB — POC OCCULT BLOOD, ED: Fecal Occult Bld: NEGATIVE

## 2016-11-04 LAB — LIPASE, BLOOD: Lipase: 23 U/L (ref 11–51)

## 2016-11-04 LAB — I-STAT BETA HCG BLOOD, ED (MC, WL, AP ONLY): I-stat hCG, quantitative: <5 m[IU]/mL (ref <5)

## 2016-11-04 LAB — PREPARE RBC (CROSSMATCH)

## 2016-11-04 LAB — MAGNESIUM: Magnesium: 1.9 mg/dL (ref 1.7–2.4)

## 2016-11-04 MED ORDER — FERROUS SULFATE 325 (65 FE) MG PO TABS: 325.0000 mg | ORAL_TABLET | Freq: Every day | ORAL | 0 refills | Status: DC

## 2016-11-04 MED ORDER — ACETAMINOPHEN 500 MG PO TABS
1000.0000 mg | ORAL_TABLET | Freq: Once | ORAL | Status: DC
  Filled 2016-11-04: qty 2

## 2016-11-04 MED ORDER — OXYCODONE-ACETAMINOPHEN 5-325 MG PO TABS
1.0000 | ORAL_TABLET | ORAL | 0 refills | Status: DC | PRN
Start: 2016-11-04 — End: 2017-02-23

## 2016-11-04 MED ORDER — SODIUM CHLORIDE 0.9 % IV SOLN
10.0000 mL/h | Freq: Once | INTRAVENOUS | Status: AC
  Administered 2016-11-04: 10 mL/h via INTRAVENOUS

## 2016-11-04 MED ORDER — FAMOTIDINE IN NACL 20-0.9 MG/50ML-% IV SOLN
20.0000 mg | Freq: Once | INTRAVENOUS | Status: AC
  Administered 2016-11-04: 20 mg via INTRAVENOUS
  Filled 2016-11-04: qty 50

## 2016-11-04 MED ORDER — DICYCLOMINE HCL 10 MG PO CAPS
10.0000 mg | ORAL_CAPSULE | Freq: Once | ORAL | Status: AC
  Administered 2016-11-04: 10 mg via ORAL
  Filled 2016-11-04: qty 1

## 2016-11-04 MED ORDER — POTASSIUM CHLORIDE CRYS ER 20 MEQ PO TBCR
40.0000 meq | EXTENDED_RELEASE_TABLET | Freq: Once | ORAL | Status: AC
  Administered 2016-11-04: 40 meq via ORAL
  Filled 2016-11-04: qty 2

## 2016-11-04 MED ORDER — ONDANSETRON 4 MG PO TBDP
4.0000 mg | ORAL_TABLET | Freq: Once | ORAL | Status: AC
  Administered 2016-11-04: 4 mg via ORAL
  Filled 2016-11-04: qty 1

## 2016-11-04 MED ORDER — FAMOTIDINE 20 MG PO TABS: 20.0000 mg | ORAL_TABLET | Freq: Two times a day (BID) | ORAL | 0 refills | Status: DC

## 2016-11-04 MED ORDER — SUCRALFATE 1 G PO TABS: 1.0000 g | ORAL_TABLET | Freq: Three times a day (TID) | ORAL | 0 refills | Status: DC

## 2016-11-04 MED ORDER — CALCIUM CARBONATE ANTACID 500 MG PO CHEW
2.0000 | CHEWABLE_TABLET | Freq: Once | ORAL | Status: DC
  Filled 2016-11-04: qty 2

## 2016-11-04 MED ORDER — KETOROLAC TROMETHAMINE 30 MG/ML IJ SOLN
30.0000 mg | Freq: Once | INTRAMUSCULAR | Status: AC
  Administered 2016-11-04: 30 mg via INTRAVENOUS
  Filled 2016-11-04: qty 1

## 2016-11-04 NOTE — ED Notes (Signed)
Date and time results received: 11/04/16 0839  Test: Hgb Critical Value:  6.6   Name of Provider Notified: Little MD

## 2016-11-04 NOTE — Discharge Instructions (Addendum)
1. Start taking iron daily. 2. Eat lots of protein including beans and meat. 3. Take Nexium ONLY ONE TIME DAILY. 4. You may take pepcid and/or tums as needed for symptoms.  5. Start taking carafate daily.  RETURN TO ER IF ANY BLOODY STOOLS, BLOODY VOMIT, SHORTNESS OF BREATH, OR SEVERE LIGHTHEADEDNESS.

## 2016-11-04 NOTE — ED Notes (Signed)
Spoke with blood bank they state blood will be ready in 20 minutes

## 2016-11-04 NOTE — Discharge Planning (Signed)
Makella Buckingham J. Lucretia RoersWood, RN, BSN, [place] [phone]  [facility] with Sindy Messingoger Gomez, PA on 6/4 @ 4:00.  Spoke with pt at bedside and advised to please arrive 15 min early and take a picture ID and your current medications.  Pt verbalizes understanding of keeping appointment.

## 2016-11-04 NOTE — ED Triage Notes (Signed)
Pt sts abd pain with N/V; pt sts hx of ulcers and feels similar

## 2016-11-04 NOTE — ED Provider Notes (Addendum)
[facility]-EMERG[facility]Y DEPT Provider Note   CSN: [id number] Arrival date & time: 11/04/16  0703     History   Chief Complaint Chief Complaint  Patient presents with  . Abdominal Pain    HPI Jenny Evans is a 46 y.o. female.  46yo F w/ PMH incluidng anemia, GERD, PUD, asthma who presents with abdominal pain and vomiting. The patient reports a history of ulcers and states that she has recently had similar symptoms with upper abdominal pain. She reports a week and a half of nausea and vomiting associated with this pain, worse when she tries to eat. She has taken an entire 14 pill pack of Nexium over the past 2-3 days with no improvement of her symptoms. She has not currently on Carafate. She has not followed with a PCP or gastroenterologist recently for her symptoms because they had been improved for a while. She denies any alcohol, heavy NSAID use. She has had some mild diarrhea this morning. She denies any fevers or urinary symptoms.   The history is provided by the patient.    Past Medical History:  Diagnosis Date  . Anemia   . Asthma   . Chronic pain   . GERD (gastroesophageal reflux disease)   . Migraine   . Sickle cell trait (HCC)   . Ulcer     Patient Active Problem List   Diagnosis Date Noted  . Blood loss anemia 11/12/2015  . Melena 11/12/2015  . Nonspecific abnormal electrocardiogram (ECG) (EKG) 04/03/2015  . Palpitations 04/03/2015  . Neck pain 04/03/2015  . Colitis 07/10/2014  . Absolute anemia   . Post-operative state 08/26/2013  . Rectocele 08/26/2013  . Anal or rectal pain 07/29/2013  . Headache(784.0) 02/27/2012  . ACUTE BRONCHITIS 03/25/2010  . OBESITY 09/06/2009  . INSOMNIA 01/23/2009  . HYPOKALEMIA 11/01/2008  . [facility] 06/07/2008  . ALLERGIC RHINITIS 03/23/2008  . CONSTIPATION 01/20/2008  . SHORTNESS OF BREATH 08/27/2007  . TOBACCO ABUSE 03/09/2007  . Migraine 03/09/2007  . ABSCESS, TOOTH 03/09/2007  .  GERD (gastroesophageal reflux disease) 03/09/2007  . HEADACHE, CLUSTER 02/25/2007  . ECZEMA 02/25/2007    Past Surgical History:  Procedure Laterality Date  . ESOPHAGOGASTRODUODENOSCOPY (EGD) WITH PROPOFOL N/A 04/04/2013   Procedure: ESOPHAGOGASTRODUODENOSCOPY (EGD) WITH PROPOFOL;  Surgeon: Willis ModenaWilliam Outlaw, MD;  Location: [facility];  Service: Endoscopy;  Laterality: N/A;  . EXAMINATION UNDER ANESTHESIA N/A 08/10/2013   Procedure: RECTAL EXAM UNDER ANESTHESIA;  Surgeon: Clovis Puhomas A. Cornett, MD;  Location: [facility];  Service: General;  Laterality: N/A;  ligation  . HEMORRHOID SURGERY    . MYRINGOTOMY     age 46    OB History    No data available       Home Medications    Prior to Admission medications   Medication Sig Start Date End Date Taking? Authorizing Provider  esomeprazole (NEXIUM) 20 MG capsule Take 20-60 mg by mouth daily as needed.     [provider]  ibuprofen (ADVIL,MOTRIN) 200 MG tablet Take 200 mg by mouth every 6 (six) hours as needed.    [provider]  sucralfate (CARAFATE) 1 GM/10ML suspension Take 10 mLs (1 g total) by mouth 4 (four) times daily -  with meals and at bedtime. Patient not taking: Reported on 07/30/2016 07/21/16   Charlynne PanderYao, David Hsienta, MD    Family History Family History  Problem Relation Age of Onset  . Heart disease Mother        Arrythmia  .  Kidney disease Mother   . Hypertension Mother   . COPD Mother   . Diabetes Mother   . Heart disease Father   . Crohn's disease Maternal Aunt     Social History Social History  Substance Use Topics  . Smoking status: Current Some Day Smoker    Packs/day: 0.25    Years: 15.00    Types: Cigarettes  . Smokeless tobacco: Never Used  . Alcohol use No     Allergies   Hydrocodone-acetaminophen   Review of Systems Review of Systems All other systems reviewed and are negative except that which was mentioned in HPI  Physical Exam Updated Vital Signs BP 96/60    Pulse 78   Temp 98.6 F (37 C) (Oral)   Resp 18   SpO2 100%   Physical Exam  Constitutional: She is oriented to person, place, and time. She appears well-developed and well-nourished. No distress.  HENT:  Head: Normocephalic and atraumatic.  Moist mucous membranes  Eyes: Conjunctivae are normal. Pupils are equal, round, and reactive to light.  Neck: Neck supple.  Cardiovascular: Normal rate, regular rhythm and normal heart sounds.   No murmur heard. Pulmonary/Chest: Effort normal and breath sounds normal.  Abdominal: Soft. Bowel sounds are normal. She exhibits no distension. There is tenderness (LUQ, epigastric, RUQ).  Musculoskeletal: She exhibits no edema.  Neurological: She is alert and oriented to person, place, and time.  Fluent speech  Skin: Skin is warm and dry.  Psychiatric: She has a normal mood and affect. Judgment normal.  Nursing note and vitals reviewed.    ED Treatments / Results  Labs (all labs ordered are listed, but only abnormal results are displayed) Labs Reviewed  COMPREHENSIVE METABOLIC PANEL - Abnormal; Notable for the following:       Result Value   Potassium 3.1 (*)    Total Protein 6.3 (*)    ALT 8 (*)    All other components within normal limits  CBC WITH DIFFERENTIAL/PLATELET - Abnormal; Notable for the following:    RBC 3.34 (*)    Hemoglobin 6.3 (*)    HCT 21.9 (*)    MCV 65.6 (*)    MCH 18.9 (*)    MCHC 28.8 (*)    RDW 19.4 (*)    Platelets 585 (*)    All other components within normal limits  IRON AND TIBC - Abnormal; Notable for the following:    Iron 7 (*)    TIBC 455 (*)    Saturation Ratios 2 (*)    All other components within normal limits  LIPASE, BLOOD  MAGNESIUM  I-STAT BETA HCG BLOOD, ED ([facility], [facility], [facility] ONLY)  POC OCCULT BLOOD, ED  PREPARE RBC (CROSSMATCH)  TYPE AND SCREEN    EKG  EKG Interpretation None       Radiology No results found.  Procedures .Critical Care Performed by: Laurence Spates Authorized by: Laurence Spates   Critical care provider statement:    Critical care time (minutes):  45   Critical care time was exclusive of:  Separately billable procedures and treating other patients   Critical care was necessary to treat or prevent imminent or life-threatening deterioration of the following conditions: symptomatic anemia.   Critical care was time spent personally by me on the following activities:  Development of treatment plan with patient or surrogate, discussions with consultants, evaluation of patient's response to treatment, examination of patient, obtaining history from patient or surrogate, ordering and performing treatments and interventions,  ordering and review of laboratory studies, re-evaluation of patient's condition and review of old charts   (including critical care time)  Medications Ordered in ED Medications  calcium carbonate (TUMS - dosed in mg elemental calcium) chewable tablet 400 mg of elemental calcium (400 mg of elemental calcium Oral Refused 11/04/16 0803)  acetaminophen (TYLENOL) tablet 1,000 mg (1,000 mg Oral Refused 11/04/16 1400)  ondansetron (ZOFRAN-ODT) disintegrating tablet 4 mg (4 mg Oral Given 11/04/16 0756)  dicyclomine (BENTYL) capsule 10 mg (10 mg Oral Given 11/04/16 0756)  famotidine (PEPCID) IVPB 20 mg premix (0 mg Intravenous Stopped 11/04/16 0827)  potassium chloride SA (K-DUR,KLOR-CON) CR tablet 40 mEq (40 mEq Oral Given 11/04/16 0910)  0.9 %  sodium chloride infusion (10 mL/hr Intravenous New Bag/Given 11/04/16 1529)  ketorolac (TORADOL) 30 MG/ML injection 30 mg (30 mg Intravenous Given 11/04/16 1529)     Initial Impression / Assessment and Plan / ED Course  I have reviewed the triage vital signs and the nursing notes.  Pertinent labs that were available during my care of the patient were reviewed by me and considered in my medical decision making (see chart for details).     Pt w/ h/o GERD and PUD p/w nausea and  vomiting as well as upper abdominal pain that she states is similar to previous issues related with ulcers. She was nontoxic on exam, vital signs notable for heart rate in the 50s and BP 99/66. She had tenderness across her upper abdomen which she states is the same area that she has had pain in previously. Review of chart shows multiple previous visits for similar sx. obtained above lab work because of her history of anemia and reported vomiting.  Labs notable for hemoglobin of 6.3 with hematocrit 21.9. Potassium 3.1 for which I gave her oral repletion. She does note that she has not been compliant with iron because she has not felt symptomatic. On further questioning, she occasionally has mild shortness of breath when she is walking for a long period of time and occasionally feels lightheaded when sitting up but does not have frequent symptoms.  I discussed her case with [facility], [facility] and Dr. Konrad Dolores. They felt that because of her slow progression of anemia and stable vital signs here without evidence of an acute bleed, she is appropriate for close outpatient management and they recommended a unit of blood prior to discharge. Obtained consent for transfusion, blood ordered.  I contacted case management as the patient does not currently have a PCP. They have arranged for follow-up appointment. Patient has voiced understanding of compliance with iron.  I am signing the patient out to the oncoming provider who will follow up after completion of blood transfusion. I anticipate discharge.  Final Clinical Impressions(s) / ED Diagnoses   Final diagnoses:  None    New Prescriptions New Prescriptions   No medications on file     Oriya Kettering, Ambrose Finland, MD 11/04/16 1624    Jochebed Bills, Ambrose Finland, MD 11/04/16 [phone]

## 2016-11-04 NOTE — ED Notes (Signed)
Pt in room appears in no distress friend at bedside to transport home

## 2016-11-05 LAB — TYPE AND SCREEN
ABO/RH(D): A POS
Antibody Screen: POSITIVE
DAT, IgG: NEGATIVE
PT AG Type: NEGATIVE
UNIT DIVISION: 0
UNIT DIVISION: 0
Unit division: 0

## 2016-11-05 LAB — BPAM RBC
BLOOD PRODUCT EXPIRATION DATE: 201806042359
Blood Product Expiration Date: 201806012359
Blood Product Expiration Date: 201806042359
ISSUE DATE / TIME: 201805221512
UNIT TYPE AND RH: 6200
UNIT TYPE AND RH: 6200
Unit Type and Rh: 6200

## 2016-11-06 ENCOUNTER — Emergency Department (HOSPITAL_BASED_OUTPATIENT_CLINIC_OR_DEPARTMENT_OTHER)
Admission: EM | Admit: 2016-11-06 | Discharge: 2016-11-06 | Disposition: A | Discharge status: Home / Self Care | Payer: Managed Care, Other (non HMO) | Attending: Emergency Medicine | Admitting: Emergency Medicine

## 2016-11-06 ENCOUNTER — Encounter (HOSPITAL_BASED_OUTPATIENT_CLINIC_OR_DEPARTMENT_OTHER): Payer: Self-pay

## 2016-11-06 DIAGNOSIS — L299 Pruritus, unspecified: Secondary | ICD-10-CM

## 2016-11-06 DIAGNOSIS — Y828 Other medical devices associated with adverse incidents: Secondary | ICD-10-CM | POA: Insufficient documentation

## 2016-11-06 DIAGNOSIS — J45909 Unspecified asthma, uncomplicated: Secondary | ICD-10-CM | POA: Insufficient documentation

## 2016-11-06 DIAGNOSIS — T8092XA Unspecified transfusion reaction, initial encounter: Secondary | ICD-10-CM

## 2016-11-06 DIAGNOSIS — Z79899 Other long term (current) drug therapy: Secondary | ICD-10-CM | POA: Insufficient documentation

## 2016-11-06 DIAGNOSIS — F17210 Nicotine dependence, cigarettes, uncomplicated: Secondary | ICD-10-CM | POA: Insufficient documentation

## 2016-11-06 MED ORDER — DIPHENHYDRAMINE HCL 25 MG PO CAPS
50.0000 mg | ORAL_CAPSULE | Freq: Once | ORAL | Status: AC
  Administered 2016-11-06: 50 mg via ORAL
  Filled 2016-11-06: qty 2

## 2016-11-06 NOTE — Discharge Instructions (Signed)
As discussed continue to use antihistamines such as Benadryl. Zyrtec or Claritin in the daytime if you need to avoid drowsiness. Follow-up with your primary care provider.  Return to the emergency department if you experience any new concerning symptoms in the meantime.

## 2016-11-06 NOTE — ED Notes (Signed)
Assumed care of patient from [place], [place]. Pt is resting quietly and in no distress. States relief from Benadryl, denies further itching. Sleepy but easily arouses to verbal stimuli.

## 2016-11-06 NOTE — ED Provider Notes (Signed)
[facility]   CSN: [id number] Arrival date & time: 11/06/16  1811  By signing my name below, I, Jenny Evans, attest that this documentation has been prepared under the direction and in the presence of [name], PA-C. Electronically Signed: Cynda Evans, Scribe. 11/06/16. 7:07 PM.  History   Chief Complaint Chief Complaint  Patient presents with  . Pruritis   HPI Comments: Jenny Evans is a 46 y.o. female with a history of eczema, who presents to the Emergency Department complaining of sudden-onset, constant generalized itching that began earlier today. Patient states she has been constantly itching since she woke up this morning. Patient denies any new product exposure. Patient states she received a blood transfusion two days ago due to anemia secondary to abdominal ulcers. Patient states she has had transfusions in the past, which never caused these symptoms. Patient reports applying cream with no relief. Patient denies any fever, chills, numbness, tingling, or weakness.   The history is provided by the patient. No language interpreter was used.    Past Medical History:  Diagnosis Date  . Anemia   . Asthma   . Chronic pain   . GERD (gastroesophageal reflux disease)   . Migraine   . Sickle cell trait (HCC)   . Ulcer     Patient Active Problem List   Diagnosis Date Noted  . Blood loss anemia 11/12/2015  . Melena 11/12/2015  . Nonspecific abnormal electrocardiogram (ECG) (EKG) 04/03/2015  . Palpitations 04/03/2015  . Neck pain 04/03/2015  . Colitis 07/10/2014  . Absolute anemia   . Post-operative state 08/26/2013  . Rectocele 08/26/2013  . Anal or rectal pain 07/29/2013  . Headache(784.0) 02/27/2012  . ACUTE BRONCHITIS 03/25/2010  . OBESITY 09/06/2009  . INSOMNIA 01/23/2009  . HYPOKALEMIA 11/01/2008  . DYSPEPSIA&OTHER [facility] DISORDERS FUNCTION STOMACH 06/07/2008  . ALLERGIC RHINITIS 03/23/2008  . CONSTIPATION 01/20/2008  .  SHORTNESS OF BREATH 08/27/2007  . TOBACCO ABUSE 03/09/2007  . Migraine 03/09/2007  . ABSCESS, TOOTH 03/09/2007  . GERD (gastroesophageal reflux disease) 03/09/2007  . HEADACHE, CLUSTER 02/25/2007  . ECZEMA 02/25/2007    Past Surgical History:  Procedure Laterality Date  . ESOPHAGOGASTRODUODENOSCOPY (EGD) WITH PROPOFOL N/A 04/04/2013   Procedure: ESOPHAGOGASTRODUODENOSCOPY (EGD) WITH PROPOFOL;  Surgeon: Willis Modena, MD;  Location: [facility];  Service: Endoscopy;  Laterality: N/A;  . EXAMINATION UNDER ANESTHESIA N/A 08/10/2013   Procedure: RECTAL EXAM UNDER ANESTHESIA;  Surgeon: Clovis Pu. Cornett, MD;  Location: [facility];  Service: General;  Laterality: N/A;  ligation  . HEMORRHOID SURGERY    . MYRINGOTOMY     age 9    OB History    No data available       Home Medications    Prior to Admission medications   Medication Sig Start Date End Date Taking? Authorizing Provider  esomeprazole (NEXIUM) 20 MG capsule Take 20-60 mg by mouth daily as needed.     [provider]  famotidine (PEPCID) 20 MG tablet Take 1 tablet (20 mg total) by mouth 2 (two) times daily. 11/04/16   Little, Ambrose Finland, MD  ferrous sulfate 325 (65 FE) MG tablet Take 1 tablet (325 mg total) by mouth daily. 11/04/16   Little, Ambrose Finland, MD  oxyCODONE-acetaminophen (PERCOCET/ROXICET) 5-325 MG tablet Take 1 tablet by mouth every 4 (four) hours as needed for severe pain. 11/04/16   Tegeler, Canary Brim, MD  sucralfate (CARAFATE) 1 g tablet Take 1 tablet (1 g total) by mouth 4 (  four) times daily -  with meals and at bedtime. 11/04/16   Little, Ambrose Finlandachel Morgan, MD    Family History Family History  Problem Relation Age of Onset  . Heart disease Mother        Arrythmia  . Kidney disease Mother   . Hypertension Mother   . COPD Mother   . Diabetes Mother   . Heart disease Father   . Crohn's disease Maternal Aunt     Social History Social History  Substance Use Topics  .  Smoking status: Current Some Day Smoker    Packs/day: 0.25    Years: 15.00    Types: Cigarettes  . Smokeless tobacco: Never Used  . Alcohol use No     Allergies   Hydrocodone-acetaminophen   Review of Systems Review of Systems  Constitutional: Negative for chills and fever.  Respiratory: Negative for chest tightness, shortness of breath, wheezing and stridor.   Cardiovascular: Negative for palpitations.  Gastrointestinal: Negative for abdominal pain, nausea and vomiting.  Musculoskeletal: Negative for myalgias and neck pain.  Skin: Negative for color change, pallor and rash.       Generalized pruritis.  Neurological: Negative for weakness and numbness.     Physical Exam Updated Vital Signs BP 102/69   Pulse 78   Temp 98.2 F (36.8 C) (Oral)   Resp 16   Ht 5\' 4"  (1.626 m)   Wt 57.2 kg (126 lb)   LMP 11/06/2016   SpO2 97%   BMI 21.63 kg/m   Physical Exam  Constitutional: She is oriented to person, place, and time. She appears well-developed and well-nourished. No distress.  Patient is afebrile, non-toxic appearing, seating comfortably in chair in no acute distress.  HENT:  Head: Normocephalic and atraumatic.  Eyes: EOM are normal. Right eye exhibits no discharge. Left eye exhibits no discharge.  Neck: Normal range of motion.  Cardiovascular: Normal rate, regular rhythm and normal heart sounds.   Pulmonary/Chest: Effort normal and breath sounds normal. No respiratory distress. She has no wheezes. She has no rales.  Abdominal: Soft. She exhibits no distension. There is no tenderness.  Musculoskeletal: Normal range of motion. She exhibits no edema.  Neurological: She is alert and oriented to person, place, and time.  Skin: Skin is warm and dry. No rash noted. She is not diaphoretic. No erythema. No pallor.  Psychiatric: She has a normal mood and affect. Judgment normal.  Nursing note and vitals reviewed.    ED Treatments / Results  DIAGNOSTIC STUDIES: Oxygen  Saturation is 100% on RA, normal by my interpretation.    COORDINATION OF CARE: 7:07 PM Discussed treatment plan with pt at bedside and pt agreed to plan.  Labs (all labs ordered are listed, but only abnormal results are displayed) Labs Reviewed - No data to display  EKG  EKG Interpretation None       Radiology No results found.  Procedures Procedures (including critical care time)  Medications Ordered in ED Medications  diphenhydrAMINE (BENADRYL) capsule 50 mg (50 mg Oral Given 11/06/16 1904)     Initial Impression / Assessment and Plan / ED Course  I have reviewed the triage vital signs and the nursing notes.  Pertinent labs & imaging results that were available during my care of the patient were reviewed by me and considered in my medical decision making (see chart for details).     Patient presented with generalized pruritus upon awakening 2 days after blood transfusion. No rash, erythema or exposure to new  products.   Patient was given Benadryl while in the emergency department and upon reassessment was sleeping comfortably. She reported significant improvement.  We'll discharge home with symptomatic relief, anti-histamines and close follow-up with PCP. Patient was ready to go home.  Discussed strict return precautions and advised to return to the emergency department if experiencing any new or worsening symptoms. Instructions were understood and patient agreed with discharge plan. Final Clinical Impressions(s) / ED Diagnoses   Final diagnoses:  Blood transfusion reaction, initial encounter  Pruritus    New Prescriptions New Prescriptions   No medications on file   I personally performed the services described in this documentation, which was scribed in my presence. The recorded information has been reviewed and is accurate.     Gregary Cromer 11/06/16 2028    Benjiman Core, MD 11/06/16 2251

## 2016-11-06 NOTE — ED Triage Notes (Signed)
C/o itching x today-NAD-steady gait

## 2016-11-17 ENCOUNTER — Inpatient Hospital Stay (INDEPENDENT_AMBULATORY_CARE_PROVIDER_SITE_OTHER): Payer: Managed Care, Other (non HMO) | Admitting: Physician Assistant

## 2016-12-10 ENCOUNTER — Ambulatory Visit (INDEPENDENT_AMBULATORY_CARE_PROVIDER_SITE_OTHER): Payer: Self-pay | Admitting: Physician Assistant

## 2016-12-24 ENCOUNTER — Emergency Department (HOSPITAL_COMMUNITY)
Admission: EM | Admit: 2016-12-24 | Discharge: 2016-12-24 | Disposition: A | Discharge status: Home / Self Care | Payer: Self-pay | Attending: Emergency Medicine | Admitting: Emergency Medicine

## 2016-12-24 ENCOUNTER — Encounter (HOSPITAL_COMMUNITY): Payer: Self-pay | Admitting: Emergency Medicine

## 2016-12-24 DIAGNOSIS — K5900 Constipation, unspecified: Secondary | ICD-10-CM | POA: Insufficient documentation

## 2016-12-24 DIAGNOSIS — J45909 Unspecified asthma, uncomplicated: Secondary | ICD-10-CM | POA: Insufficient documentation

## 2016-12-24 DIAGNOSIS — R1010 Upper abdominal pain, unspecified: Secondary | ICD-10-CM | POA: Insufficient documentation

## 2016-12-24 DIAGNOSIS — F17210 Nicotine dependence, cigarettes, uncomplicated: Secondary | ICD-10-CM | POA: Insufficient documentation

## 2016-12-24 LAB — URINALYSIS, ROUTINE W REFLEX MICROSCOPIC
Bilirubin Urine: NEGATIVE
Glucose, UA: NEGATIVE mg/dL
Hgb urine dipstick: NEGATIVE
Ketones, ur: NEGATIVE mg/dL
Leukocytes, UA: NEGATIVE
Nitrite: NEGATIVE
Protein, ur: NEGATIVE mg/dL
SPECIFIC GRAVITY, URINE: 1.01 (ref 1.005–1.030)
pH: 7 (ref 5.0–8.0)

## 2016-12-24 LAB — CBC
HCT: 27.9 % — ABNORMAL LOW (ref 36.0–46.0)
HEMOGLOBIN: 8.6 g/dL — AB (ref 12.0–15.0)
MCH: 21.4 pg — AB (ref 26.0–34.0)
MCHC: 30.8 g/dL (ref 30.0–36.0)
MCV: 69.6 fL — ABNORMAL LOW (ref 78.0–100.0)
PLATELETS: 412 10*3/uL — AB (ref 150–400)
RBC: 4.01 MIL/uL (ref 3.87–5.11)
RDW: 21.1 % — ABNORMAL HIGH (ref 11.5–15.5)
WBC: 7.3 10*3/uL (ref 4.0–10.5)

## 2016-12-24 LAB — COMPREHENSIVE METABOLIC PANEL
ALBUMIN: 4 g/dL (ref 3.5–5.0)
ALK PHOS: 61 U/L (ref 38–126)
ALT: 9 U/L — AB (ref 14–54)
AST: 18 U/L (ref 15–41)
Anion gap: 7 (ref 5–15)
BUN: 6 mg/dL (ref 6–20)
CALCIUM: 9.2 mg/dL (ref 8.9–10.3)
CO2: 22 mmol/L (ref 22–32)
CREATININE: 0.63 mg/dL (ref 0.44–1.00)
Chloride: 111 mmol/L (ref 101–111)
GFR calc Af Amer: >60 mL/min (ref >60)
GFR calc non Af Amer: >60 mL/min (ref >60)
GLUCOSE: 93 mg/dL (ref 65–99)
Potassium: 3.5 mmol/L (ref 3.5–5.1)
SODIUM: 140 mmol/L (ref 135–145)
Total Bilirubin: 0.2 mg/dL — ABNORMAL LOW (ref 0.3–1.2)
Total Protein: 7 g/dL (ref 6.5–8.1)

## 2016-12-24 LAB — I-STAT BETA HCG BLOOD, ED (MC, WL, AP ONLY)

## 2016-12-24 LAB — LIPASE, BLOOD: Lipase: 27 U/L (ref 11–51)

## 2016-12-24 MED ORDER — SUCRALFATE 1 G PO TABS: 1.0000 g | ORAL_TABLET | Freq: Three times a day (TID) | ORAL | 0 refills | Status: DC

## 2016-12-24 MED ORDER — DOCUSATE SODIUM 250 MG PO CAPS: 250.0000 mg | ORAL_CAPSULE | Freq: Every day | ORAL | 0 refills | Status: DC

## 2016-12-24 NOTE — ED Provider Notes (Signed)
[facility]-EMERGENCY DEPT Provider Note   CSN: [id number] Arrival date & time: 12/24/16  1751     History   Chief Complaint Chief Complaint  Patient presents with  . Abdominal Pain    HPI Jenny Evans is a 46 y.o. female.  46 year old female presents with several days of epigastric pain is worse with eating. Denies any hematemesis. No black or bloody stools. Pain does radiate to both flanks and is similar to her prior history of peptic ulcer disease. She has been taking Nexium without relief. Also notes constipation recently she thinks is related to her increased intake of cornstarch. Has used an enema without relief. Has been passing flatus.      Past Medical History:  Diagnosis Date  . Anemia   . Asthma   . Chronic pain   . GERD (gastroesophageal reflux disease)   . Migraine   . Sickle cell trait (HCC)   . Ulcer     Patient Active Problem List   Diagnosis Date Noted  . Blood loss anemia 11/12/2015  . Melena 11/12/2015  . Nonspecific abnormal electrocardiogram (ECG) (EKG) 04/03/2015  . Palpitations 04/03/2015  . Neck pain 04/03/2015  . Colitis 07/10/2014  . Absolute anemia   . Post-operative state 08/26/2013  . Rectocele 08/26/2013  . Anal or rectal pain 07/29/2013  . Headache(784.0) 02/27/2012  . ACUTE BRONCHITIS 03/25/2010  . OBESITY 09/06/2009  . INSOMNIA 01/23/2009  . HYPOKALEMIA 11/01/2008  . DYSPEPSIA&OTHER [facility] DISORDERS FUNCTION STOMACH 06/07/2008  . ALLERGIC RHINITIS 03/23/2008  . CONSTIPATION 01/20/2008  . SHORTNESS OF BREATH 08/27/2007  . TOBACCO ABUSE 03/09/2007  . Migraine 03/09/2007  . ABSCESS, TOOTH 03/09/2007  . GERD (gastroesophageal reflux disease) 03/09/2007  . HEADACHE, CLUSTER 02/25/2007  . ECZEMA 02/25/2007    Past Surgical History:  Procedure Laterality Date  . ESOPHAGOGASTRODUODENOSCOPY (EGD) WITH PROPOFOL N/A 04/04/2013   Procedure: ESOPHAGOGASTRODUODENOSCOPY (EGD) WITH PROPOFOL;  Surgeon: Willis Modena, MD;  Location: [facility]  ENDOSCOPY;  Service: Endoscopy;  Laterality: N/A;  . EXAMINATION UNDER ANESTHESIA N/A 08/10/2013   Procedure: RECTAL EXAM UNDER ANESTHESIA;  Surgeon: Clovis Pu. Cornett, MD;  Location: [facility];  Service: General;  Laterality: N/A;  ligation  . HEMORRHOID SURGERY    . MYRINGOTOMY     age 24    OB History    No data available       Home Medications    Prior to Admission medications   Medication Sig Start Date End Date Taking? Authorizing Provider  esomeprazole (NEXIUM) 20 MG capsule Take 40-60 mg by mouth daily as needed.    Yes [provider]  famotidine (PEPCID) 20 MG tablet Take 1 tablet (20 mg total) by mouth 2 (two) times daily. Patient not taking: Reported on 12/24/2016 11/04/16   Little, Ambrose Finland, MD  ferrous sulfate 325 (65 FE) MG tablet Take 1 tablet (325 mg total) by mouth daily. Patient not taking: Reported on 12/24/2016 11/04/16   Little, Ambrose Finland, MD  oxyCODONE-acetaminophen (PERCOCET/ROXICET) 5-325 MG tablet Take 1 tablet by mouth every 4 (four) hours as needed for severe pain. Patient not taking: Reported on 12/24/2016 11/04/16   Tegeler, Canary Brim, MD  sucralfate (CARAFATE) 1 g tablet Take 1 tablet (1 g total) by mouth 4 (four) times daily -  with meals and at bedtime. Patient not taking: Reported on 12/24/2016 11/04/16   Little, Ambrose Finland, MD    Family History Family History  Problem Relation Age of Onset  . Heart disease Mother  Arrythmia  . Kidney disease Mother   . Hypertension Mother   . COPD Mother   . Diabetes Mother   . Heart disease Father   . Crohn's disease Maternal Aunt     Social History Social History  Substance Use Topics  . Smoking status: Current Some Day Smoker    Packs/day: 0.25    Years: 15.00    Types: Cigarettes  . Smokeless tobacco: Never Used  . Alcohol use No     Allergies   Hydrocodone-acetaminophen   Review of Systems Review of Systems  All other systems reviewed and are  negative.    Physical Exam Updated Vital Signs BP 101/82 (BP Location: Left Arm)   Pulse 92   Temp 98.7 F (37.1 C) (Oral)   Resp 18   Ht 1.626 m (5\' 4" )   Wt 58.1 kg (128 lb)   SpO2 100%   BMI 21.97 kg/m   Physical Exam  Constitutional: She is oriented to person, place, and time. She appears well-developed and well-nourished.  Non-toxic appearance. No distress.  HENT:  Head: Normocephalic and atraumatic.  Eyes: Conjunctivae, EOM and lids are normal. Pupils are equal, round, and reactive to light.  Neck: Normal range of motion. Neck supple. No tracheal deviation present. No thyroid mass present.  Cardiovascular: Normal rate, regular rhythm and normal heart sounds.  Exam reveals no gallop.   No murmur heard. Pulmonary/Chest: Effort normal and breath sounds normal. No stridor. No respiratory distress. She has no decreased breath sounds. She has no wheezes. She has no rhonchi. She has no rales.  Abdominal: Soft. Normal appearance and bowel sounds are normal. She exhibits no distension. There is no tenderness. There is no rigidity, no rebound, no guarding and no CVA tenderness.  Musculoskeletal: Normal range of motion. She exhibits no edema or tenderness.  Neurological: She is alert and oriented to person, place, and time. She has normal strength. No cranial nerve deficit or sensory deficit. GCS eye subscore is 4. GCS verbal subscore is 5. GCS motor subscore is 6.  Skin: Skin is warm and dry. No abrasion and no rash noted.  Psychiatric: She has a normal mood and affect. Her speech is normal and behavior is normal.  Nursing note and vitals reviewed.    ED Treatments / Results  Labs (all labs ordered are listed, but only abnormal results are displayed) Labs Reviewed  COMPREHENSIVE METABOLIC PANEL - Abnormal; Notable for the following:       Result Value   ALT 9 (*)    Total Bilirubin 0.2 (*)    All other components within normal limits  CBC - Abnormal; Notable for the following:     Hemoglobin 8.6 (*)    HCT 27.9 (*)    MCV 69.6 (*)    MCH 21.4 (*)    RDW 21.1 (*)    Platelets 412 (*)    All other components within normal limits  URINALYSIS, ROUTINE W REFLEX MICROSCOPIC - Abnormal; Notable for the following:    Color, Urine STRAW (*)    All other components within normal limits  LIPASE, BLOOD  I-STAT BETA HCG BLOOD, ED ([facility], [facility], [facility] ONLY)    EKG  EKG Interpretation None       Radiology No results found.  Procedures Procedures (including critical care time)  Medications Ordered in ED Medications - No data to display   Initial Impression / Assessment and Plan / ED Course  I have reviewed the triage vital signs and the  nursing notes.  Pertinent labs & imaging results that were available during my care of the patient were reviewed by me and considered in my medical decision making (see chart for details).     Patient's labs are reassuring. Suspect that patient has some element of PUD as well as constipation. Will prescribe stool softener as well as Carafate. She will follow-up with her GI doctor  Final Clinical Impressions(s) / ED Diagnoses   Final diagnoses:  None    New Prescriptions New Prescriptions   No medications on file     Lorre Nick, MD 12/24/16 2240

## 2016-12-24 NOTE — ED Triage Notes (Signed)
Pt comes in with complaints of recurrent GI ulcers over the past few years.  States she takes Nexium without relief.  Endorses mid abdominal pain.  Denies any bleeding.  States she has chronic anemia so her blood counts might be low.  Ambulatory and A&O x4. Also states she took her belly button ring out years ago and thinks where the hole was might be infected. Denies emesis or diarrhea.

## 2016-12-24 NOTE — ED Notes (Signed)
No bowel movement in two weeks

## 2017-02-13 ENCOUNTER — Emergency Department (HOSPITAL_COMMUNITY)
Admission: EM | Admit: 2017-02-13 | Discharge: 2017-02-13 | Disposition: A | Discharge status: Home / Self Care | Payer: Self-pay | Attending: Emergency Medicine | Admitting: Emergency Medicine

## 2017-02-13 ENCOUNTER — Encounter (HOSPITAL_COMMUNITY): Payer: Self-pay

## 2017-02-13 DIAGNOSIS — K644 Residual hemorrhoidal skin tags: Secondary | ICD-10-CM | POA: Insufficient documentation

## 2017-02-13 DIAGNOSIS — F17210 Nicotine dependence, cigarettes, uncomplicated: Secondary | ICD-10-CM | POA: Insufficient documentation

## 2017-02-13 DIAGNOSIS — J45909 Unspecified asthma, uncomplicated: Secondary | ICD-10-CM | POA: Insufficient documentation

## 2017-02-13 DIAGNOSIS — Z79899 Other long term (current) drug therapy: Secondary | ICD-10-CM | POA: Insufficient documentation

## 2017-02-13 MED ORDER — HYDROCORTISONE 2.5 % RE CREA: TOPICAL_CREAM | RECTAL | 1 refills | Status: DC

## 2017-02-13 MED ORDER — LIDOCAINE-EPINEPHRINE (PF) 2 %-1:200000 IJ SOLN
10.0000 mL | Freq: Once | INTRAMUSCULAR | Status: AC
  Administered 2017-02-13: 15:00:00
  Filled 2017-02-13: qty 20

## 2017-02-13 MED ORDER — OXYCODONE-ACETAMINOPHEN 5-325 MG PO TABS
1.0000 | ORAL_TABLET | Freq: Once | ORAL | Status: AC
  Administered 2017-02-13: 1 via ORAL
  Filled 2017-02-13: qty 1

## 2017-02-13 MED ORDER — POLYETHYLENE GLYCOL 3350 17 GM/SCOOP PO POWD: 17.0000 g | Freq: Two times a day (BID) | ORAL | 0 refills | Status: DC

## 2017-02-13 NOTE — ED Notes (Signed)
Pt complaining of pain and requested pain medication. Will PA notified and gave verbal order for 1 percocet. Pt has allergy to hydrocodone listed but states that she has no problems with percocet. Will PA notified and ordered to give percocet.

## 2017-02-13 NOTE — ED Provider Notes (Signed)
[facility]-EMERGENCY DEPT Provider Note   CSN: [id number] Arrival date & time: 02/13/17  1220     History   Chief Complaint Chief Complaint  Patient presents with  . Hemorrhoids    HPI Jenny Evans is a 46 y.o. female.  Jenny Evans is a 46 y.o. Female who presents to the emergency department complaining of a painful hemorrhoid over the past 2 days. Patient reports she's had hemorrhoids before and has had surgery for them by an unknown general surgeon previously. She reports she has a painful mass to her rectum today that is worse with walking and touching of the area. She reports her last vomiting was yesterday and was normal. It was brown stool. No hematochezia. She denies fevers, abdominal pain, nausea, vomiting, diarrhea, rashes, or hematochezia.   The history is provided by the patient and medical records. No language interpreter was used.    Past Medical History:  Diagnosis Date  . Anemia   . Asthma   . Chronic pain   . GERD (gastroesophageal reflux disease)   . Migraine   . Sickle cell trait (HCC)   . Ulcer     Patient Active Problem List   Diagnosis Date Noted  . Blood loss anemia 11/12/2015  . Melena 11/12/2015  . Nonspecific abnormal electrocardiogram (ECG) (EKG) 04/03/2015  . Palpitations 04/03/2015  . Neck pain 04/03/2015  . Colitis 07/10/2014  . Absolute anemia   . Post-operative state 08/26/2013  . Rectocele 08/26/2013  . Anal or rectal pain 07/29/2013  . Headache(784.0) 02/27/2012  . ACUTE BRONCHITIS 03/25/2010  . OBESITY 09/06/2009  . INSOMNIA 01/23/2009  . HYPOKALEMIA 11/01/2008  . DYSPEPSIA&OTHER [facility] DISORDERS FUNCTION STOMACH 06/07/2008  . ALLERGIC RHINITIS 03/23/2008  . CONSTIPATION 01/20/2008  . SHORTNESS OF BREATH 08/27/2007  . TOBACCO ABUSE 03/09/2007  . Migraine 03/09/2007  . ABSCESS, TOOTH 03/09/2007  . GERD (gastroesophageal reflux disease) 03/09/2007  . HEADACHE, CLUSTER 02/25/2007  . ECZEMA 02/25/2007    Past Surgical  History:  Procedure Laterality Date  . ESOPHAGOGASTRODUODENOSCOPY (EGD) WITH PROPOFOL N/A 04/04/2013   Procedure: ESOPHAGOGASTRODUODENOSCOPY (EGD) WITH PROPOFOL;  Surgeon: Willis Modena, MD;  Location: [facility] ENDOSCOPY;  Service: Endoscopy;  Laterality: N/A;  . EXAMINATION UNDER ANESTHESIA N/A 08/10/2013   Procedure: RECTAL EXAM UNDER ANESTHESIA;  Surgeon: Clovis Pu. Cornett, MD;  Location: [facility];  Service: General;  Laterality: N/A;  ligation  . HEMORRHOID SURGERY    . MYRINGOTOMY     age 76    OB History    No data available       Home Medications    Prior to Admission medications   Medication Sig Start Date End Date Taking? Authorizing Provider  docusate sodium (COLACE) 250 MG capsule Take 1 capsule (250 mg total) by mouth daily. 12/24/16   Lorre Nick, MD  esomeprazole (NEXIUM) 20 MG capsule Take 40-60 mg by mouth daily as needed.     [provider]  famotidine (PEPCID) 20 MG tablet Take 1 tablet (20 mg total) by mouth 2 (two) times daily. Patient not taking: Reported on 12/24/2016 11/04/16   Little, Ambrose Finland, MD  ferrous sulfate 325 (65 FE) MG tablet Take 1 tablet (325 mg total) by mouth daily. Patient not taking: Reported on 12/24/2016 11/04/16   Little, Ambrose Finland, MD  hydrocortisone (ANUSOL-HC) 2.5 % rectal cream Apply rectally 2 times daily 02/13/17   Everlene Farrier, PA-C  oxyCODONE-acetaminophen (PERCOCET/ROXICET) 5-325 MG tablet Take 1 tablet by mouth every 4 (four) hours as  needed for severe pain. Patient not taking: Reported on 12/24/2016 11/04/16   Tegeler, Canary Brim, MD  polyethylene glycol powder (GLYCOLAX/MIRALAX) powder Take 17 g by mouth 2 (two) times daily. 02/13/17   Everlene Farrier, PA-C  sucralfate (CARAFATE) 1 g tablet Take 1 tablet (1 g total) by mouth 4 (four) times daily -  with meals and at bedtime. 12/24/16   Lorre Nick, MD    Family History Family History  Problem Relation Age of Onset  . Heart disease Mother         Arrythmia  . Kidney disease Mother   . Hypertension Mother   . COPD Mother   . Diabetes Mother   . Heart disease Father   . Crohn's disease Maternal Aunt     Social History Social History  Substance Use Topics  . Smoking status: Current Some Day Smoker    Packs/day: 0.25    Years: 15.00    Types: Cigarettes  . Smokeless tobacco: Never Used  . Alcohol use No     Allergies   Hydrocodone-acetaminophen   Review of Systems Review of Systems  Constitutional: Negative for fever.  Respiratory: Negative for shortness of breath.   Gastrointestinal: Positive for rectal pain. Negative for abdominal pain, blood in stool, diarrhea, nausea and vomiting.  Genitourinary: Negative for difficulty urinating and dysuria.  Skin: Negative for rash and wound.  Neurological: Negative for dizziness and light-headedness.     Physical Exam Updated Vital Signs BP 102/68 (BP Location: Right Arm)   Pulse 91   Temp 98.1 F (36.7 C) (Oral)   Resp 16   Ht 5\' 4"  (1.626 m)   Wt 59.4 kg (131 lb)   LMP 02/02/2017   SpO2 96%   BMI 22.49 kg/m   Physical Exam  Constitutional: She appears well-developed and well-nourished. No distress.  HENT:  Head: Normocephalic and atraumatic.  Eyes: Right eye exhibits no discharge. Left eye exhibits no discharge.  Pulmonary/Chest: Effort normal. No respiratory distress.  Abdominal: Soft. There is no tenderness. There is no guarding.  Abdomen is soft and nontender to palpation.  Genitourinary:  Genitourinary Comments: Hard external hemorrhoid noted that appears to be thrombosed between 12 and 6 o'clock position.   Neurological: She is alert. Coordination normal.  Skin: Skin is warm and dry. No rash noted. She is not diaphoretic.  Psychiatric: She has a normal mood and affect. Her behavior is normal.  Nursing note and vitals reviewed.    ED Treatments / Results  Labs (all labs ordered are listed, but only abnormal results are displayed) Labs Reviewed -  No data to display  EKG  EKG Interpretation None       Radiology No results found.  Procedures .Marland KitchenIncision and Drainage Date/Time: 02/13/2017 3:06 PM Performed by: Everlene Farrier Authorized by: Everlene Farrier   Consent:    Consent obtained:  Verbal and written   Consent given by:  Patient   Risks discussed:  Bleeding, incomplete drainage, pain, damage to other organs and infection   Alternatives discussed:  No treatment, delayed treatment and alternative treatment Location:    Type:  External thrombosed hemorrhoid   Size:  3 cm   Location:  Anogenital   Anogenital location:  Perianal Pre-procedure details:    Skin preparation:  Betadine Anesthesia (see MAR for exact dosages):    Anesthesia method:  Local infiltration   Local anesthetic:  Lidocaine 1% w/o epi Post-procedure details:    Patient tolerance of procedure:  Procedure terminated at patient's request   (  including critical care time)  Medications Ordered in ED Medications  lidocaine-EPINEPHrine (XYLOCAINE W/EPI) 2 %-1:200000 (PF) injection 10 mL ( Infiltration Given 02/13/17 1509)  oxyCODONE-acetaminophen (PERCOCET/ROXICET) 5-325 MG per tablet 1 tablet (1 tablet Oral Given 02/13/17 1549)     Initial Impression / Assessment and Plan / ED Course  I have reviewed the triage vital signs and the nursing notes.  Pertinent labs & imaging results that were available during my care of the patient were reviewed by me and considered in my medical decision making (see chart for details).    This is a 46 y.o. Female who presents to the emergency department complaining of a painful hemorrhoid over the past 2 days. Patient reports she's had hemorrhoids before and has had surgery for them by an unknown general surgeon previously. She reports she has a painful mass to her rectum today that is worse with walking and touching of the area. She reports her last vomiting was yesterday and was normal. It was brown stool. No  hematochezia. On exam patient appears to have a large external hemorrhoid. It appears to be hard and possibly thrombosed. Is about 3 cm in size. After discussion with my attending and with the patient we decided to attempt to perform an incision and drainage. I discussed the plan and the wrist. Patient signed consent form. I cleaned the area and tended to start an anesthetizing the area with lidocaine. The patient reports lots of pain and was unable to sit still. She would like me to terminate the procedure. This was terminated at her request. I did not perform an incision and drainage as a patient wanted me to stop the procedure prior to this. I called and consulted with Tresa EndoKelly from [facility] [place] [facility] about the patient. She recommends conservative therapies and follow-up in the office next week. We'll discharge the patient and a saw and the MiraLAX. I discussed use of sitz baths and conservative therapies at home. I encouraged her to follow-up with general surgery as discussed. Return precautions given. I advised the patient to follow-up with their primary care provider this week. I advised the patient to return to the emergency department with new or worsening symptoms or new concerns. The patient verbalized understanding and agreement with plan.   This patient was discussed with Dr. Freida BusmanAllen who agrees with assessment and plan.   Final Clinical Impressions(s) / ED Diagnoses   Final diagnoses:  External hemorrhoid    New Prescriptions New Prescriptions   HYDROCORTISONE (ANUSOL-HC) 2.5 % RECTAL CREAM    Apply rectally 2 times daily   POLYETHYLENE GLYCOL POWDER (GLYCOLAX/MIRALAX) POWDER    Take 17 g by mouth 2 (two) times daily.     Everlene FarrierDansie, Nathaneil Feagans, PA-C 02/13/17 1554    Lorre NickAllen, Anthony, MD 02/14/17 [phone]

## 2017-02-13 NOTE — ED Notes (Signed)
Pt unable to tolerate I&D if hemorrhoids.

## 2017-02-13 NOTE — ED Notes (Signed)
Pt c/o "hemorrhoids". Hx of same, sx a few years ago. States has hx of PUD, had "black diarrhea" which made the hemorrhoids flare the last 2 days. States take "Goodie powders" sometimes even though she knows she is not supposed to. Pt unable to sit due to rectal pain.

## 2017-02-13 NOTE — ED Notes (Signed)
Pt verbalized understanding to see surgeon next week and has no further questions. Pt removed all belongings from room and is d/c home with family driving. VSS

## 2017-02-13 NOTE — ED Triage Notes (Signed)
Per Pt, Pt is coming from home with complaints of hemorrhoids that started last week. Pt has hx of the same.

## 2017-02-23 ENCOUNTER — Encounter (HOSPITAL_COMMUNITY): Payer: Self-pay

## 2017-02-23 ENCOUNTER — Emergency Department (HOSPITAL_COMMUNITY)
Admission: EM | Admit: 2017-02-23 | Discharge: 2017-02-23 | Disposition: A | Discharge status: Home / Self Care | Payer: Self-pay | Attending: Emergency Medicine | Admitting: Emergency Medicine

## 2017-02-23 DIAGNOSIS — K649 Unspecified hemorrhoids: Secondary | ICD-10-CM | POA: Insufficient documentation

## 2017-02-23 DIAGNOSIS — J45909 Unspecified asthma, uncomplicated: Secondary | ICD-10-CM | POA: Insufficient documentation

## 2017-02-23 DIAGNOSIS — Z79899 Other long term (current) drug therapy: Secondary | ICD-10-CM | POA: Insufficient documentation

## 2017-02-23 DIAGNOSIS — K644 Residual hemorrhoidal skin tags: Secondary | ICD-10-CM

## 2017-02-23 DIAGNOSIS — F17210 Nicotine dependence, cigarettes, uncomplicated: Secondary | ICD-10-CM | POA: Insufficient documentation

## 2017-02-23 MED ORDER — OXYCODONE-ACETAMINOPHEN 5-325 MG PO TABS
1.0000 | ORAL_TABLET | Freq: Once | ORAL | Status: AC
  Administered 2017-02-23: 1 via ORAL
  Filled 2017-02-23: qty 1

## 2017-02-23 MED ORDER — OXYCODONE-ACETAMINOPHEN 5-325 MG PO TABS: 2.0000 | ORAL_TABLET | ORAL | 0 refills | Status: DC | PRN

## 2017-02-23 MED ORDER — HYDROCORTISONE ACE-PRAMOXINE 2.5-1 % RE CREA: 1.0000 "application " | TOPICAL_CREAM | Freq: Three times a day (TID) | RECTAL | 0 refills | Status: DC

## 2017-02-23 NOTE — ED Notes (Signed)
Pt ambulatory and independent at discharge.  Verbalized understanding of discharge instructions 

## 2017-02-23 NOTE — ED Triage Notes (Signed)
Patient states she is feeling dehydrated and weak x 3 days. Patient states she also has hemorrhoids that are hanging out.

## 2017-02-23 NOTE — ED Provider Notes (Signed)
[facility]-EMERGENCY DEPT Provider Note   CSN: [id number] Arrival date & time: 02/23/17  1245     History   Chief Complaint Chief Complaint  Patient presents with  . Fatigue  . Hemorrhoids    HPI Jenny Evans is a 46 y.o. female.  The history is provided by the patient. No language interpreter was used.  Rectal Bleeding  Context: hemorrhoids   Similar prior episodes: yes   Relieved by:  Nothing Worsened by:  Nothing Ineffective treatments:  Hemorrhoid cream Associated symptoms: no recent illness   Risk factors: no anticoagulant use   Pt reports she just wants medication for hemorrhoids.  Pt does not want evaluation for possible anemia/weakness.  Pt has appointment to see surgeon on Thursday   Past Medical History:  Diagnosis Date  . Anemia   . Asthma   . Chronic pain   . GERD (gastroesophageal reflux disease)   . Migraine   . Sickle cell trait (HCC)   . Ulcer     Patient Active Problem List   Diagnosis Date Noted  . Blood loss anemia 11/12/2015  . Melena 11/12/2015  . Nonspecific abnormal electrocardiogram (ECG) (EKG) 04/03/2015  . Palpitations 04/03/2015  . Neck pain 04/03/2015  . Colitis 07/10/2014  . Absolute anemia   . Post-operative state 08/26/2013  . Rectocele 08/26/2013  . Anal or rectal pain 07/29/2013  . Headache(784.0) 02/27/2012  . ACUTE BRONCHITIS 03/25/2010  . OBESITY 09/06/2009  . INSOMNIA 01/23/2009  . HYPOKALEMIA 11/01/2008  . DYSPEPSIA&OTHER [facility] DISORDERS FUNCTION STOMACH 06/07/2008  . ALLERGIC RHINITIS 03/23/2008  . CONSTIPATION 01/20/2008  . SHORTNESS OF BREATH 08/27/2007  . TOBACCO ABUSE 03/09/2007  . Migraine 03/09/2007  . ABSCESS, TOOTH 03/09/2007  . GERD (gastroesophageal reflux disease) 03/09/2007  . HEADACHE, CLUSTER 02/25/2007  . ECZEMA 02/25/2007    Past Surgical History:  Procedure Laterality Date  . ESOPHAGOGASTRODUODENOSCOPY (EGD) WITH PROPOFOL N/A 04/04/2013   Procedure: ESOPHAGOGASTRODUODENOSCOPY (EGD) WITH  PROPOFOL;  Surgeon: Willis Modena, MD;  Location: [facility];  Service: Endoscopy;  Laterality: N/A;  . EXAMINATION UNDER ANESTHESIA N/A 08/10/2013   Procedure: RECTAL EXAM UNDER ANESTHESIA;  Surgeon: Clovis Pu. Cornett, MD;  Location: [facility];  Service: General;  Laterality: N/A;  ligation  . HEMORRHOID SURGERY    . MYRINGOTOMY     age 30    OB History    No data available       Home Medications    Prior to Admission medications   Medication Sig Start Date End Date Taking? Authorizing Provider  docusate sodium (COLACE) 250 MG capsule Take 1 capsule (250 mg total) by mouth daily. 12/24/16   Lorre Nick, MD  esomeprazole (NEXIUM) 20 MG capsule Take 40-60 mg by mouth daily as needed.     [provider]  famotidine (PEPCID) 20 MG tablet Take 1 tablet (20 mg total) by mouth 2 (two) times daily. Patient not taking: Reported on 12/24/2016 11/04/16   Little, Ambrose Finland, MD  ferrous sulfate 325 (65 FE) MG tablet Take 1 tablet (325 mg total) by mouth daily. Patient not taking: Reported on 12/24/2016 11/04/16   Little, Ambrose Finland, MD  hydrocortisone (ANUSOL-HC) 2.5 % rectal cream Apply rectally 2 times daily 02/13/17   Everlene Farrier, PA-C  oxyCODONE-acetaminophen (PERCOCET/ROXICET) 5-325 MG tablet Take 1 tablet by mouth every 4 (four) hours as needed for severe pain. Patient not taking: Reported on 12/24/2016 11/04/16   Tegeler, Canary Brim, MD  polyethylene glycol powder (GLYCOLAX/MIRALAX) powder Take 17 g  by mouth 2 (two) times daily. 02/13/17   Everlene Farrieransie, William, PA-C  sucralfate (CARAFATE) 1 g tablet Take 1 tablet (1 g total) by mouth 4 (four) times daily -  with meals and at bedtime. 12/24/16   Lorre NickAllen, Anthony, MD    Family History Family History  Problem Relation Age of Onset  . Heart disease Mother        Arrythmia  . Kidney disease Mother   . Hypertension Mother   . COPD Mother   . Diabetes Mother   . Heart disease Father   . Crohn's disease  Maternal Aunt     Social History Social History  Substance Use Topics  . Smoking status: Current Some Day Smoker    Packs/day: 0.25    Years: 15.00    Types: Cigarettes  . Smokeless tobacco: Never Used  . Alcohol use No     Allergies   Hydrocodone-acetaminophen   Review of Systems Review of Systems  Gastrointestinal: Positive for hematochezia.  All other systems reviewed and are negative.    Physical Exam Updated Vital Signs BP 112/74 (BP Location: Right Arm)   Pulse 91   Temp 97.9 F (36.6 C) (Oral)   Resp 14   Ht 5\' 4"  (1.626 m)   Wt 59 kg (130 lb)   LMP 02/02/2017   SpO2 99%   BMI 22.31 kg/m   Physical Exam  Constitutional: She appears well-developed and well-nourished.  Cardiovascular: Normal rate.   Pulmonary/Chest: Effort normal.  Abdominal: Soft.  Genitourinary:  Genitourinary Comments: 2 cm x 8 mm hemorrhoid   Musculoskeletal: Normal range of motion.  Neurological: She is alert.  Skin: Skin is warm.  Psychiatric: She has a normal mood and affect.  Nursing note and vitals reviewed.    ED Treatments / Results  Labs (all labs ordered are listed, but only abnormal results are displayed) Labs Reviewed - No data to display  EKG  EKG Interpretation None       Radiology No results found.  Procedures Procedures (including critical care time)  Medications Ordered in ED Medications - No data to display   Initial Impression / Assessment and Plan / ED Course  I have reviewed the triage vital signs and the nursing notes.  Pertinent labs & imaging results that were available during my care of the patient were reviewed by me and considered in my medical decision making (see chart for details).       Final Clinical Impressions(s) / ED Diagnoses   Final diagnoses:  None    New Prescriptions New Prescriptions   HYDROCORTISONE-PRAMOXINE (ANALPRAM-HC) 2.5-1 % RECTAL CREAM    Place 1 application rectally 3 (three) times daily.    OXYCODONE-ACETAMINOPHEN (PERCOCET/ROXICET) 5-325 MG TABLET    Take 2 tablets by mouth every 4 (four) hours as needed for severe pain.  An After Visit Summary was printed and given to the patient. Pt advised to return if weakness or symptoms of anemia. Follow up with surgeon as scheduled   Osie CheeksSofia, Arslan Kier K, PA-C 02/23/17 1808    Nira Connardama, Pedro Eduardo, MD 02/24/17 [phone]

## 2017-03-26 ENCOUNTER — Encounter (HOSPITAL_COMMUNITY): Payer: Self-pay | Admitting: Emergency Medicine

## 2017-03-26 ENCOUNTER — Emergency Department (HOSPITAL_COMMUNITY)
Admission: EM | Admit: 2017-03-26 | Discharge: 2017-03-26 | Disposition: A | Discharge status: Home / Self Care | Payer: Self-pay | Attending: Emergency Medicine | Admitting: Emergency Medicine

## 2017-03-26 DIAGNOSIS — J45909 Unspecified asthma, uncomplicated: Secondary | ICD-10-CM | POA: Insufficient documentation

## 2017-03-26 DIAGNOSIS — Z79899 Other long term (current) drug therapy: Secondary | ICD-10-CM | POA: Insufficient documentation

## 2017-03-26 DIAGNOSIS — B9789 Other viral agents as the cause of diseases classified elsewhere: Secondary | ICD-10-CM | POA: Insufficient documentation

## 2017-03-26 DIAGNOSIS — R0981 Nasal congestion: Secondary | ICD-10-CM

## 2017-03-26 DIAGNOSIS — J069 Acute upper respiratory infection, unspecified: Secondary | ICD-10-CM | POA: Insufficient documentation

## 2017-03-26 DIAGNOSIS — F17210 Nicotine dependence, cigarettes, uncomplicated: Secondary | ICD-10-CM | POA: Insufficient documentation

## 2017-03-26 MED ORDER — TRIAMCINOLONE ACETONIDE 55 MCG/ACT NA AERO: 2.0000 | INHALATION_SPRAY | Freq: Every day | NASAL | 12 refills | Status: DC

## 2017-03-26 MED ORDER — PREDNISONE 50 MG PO TABS: 50.0000 mg | ORAL_TABLET | Freq: Every day | ORAL | 0 refills | Status: DC

## 2017-03-26 MED ORDER — IBUPROFEN 800 MG PO TABS
800.0000 mg | ORAL_TABLET | Freq: Once | ORAL | Status: AC
  Administered 2017-03-26: 800 mg via ORAL
  Filled 2017-03-26: qty 1

## 2017-03-26 MED ORDER — ACETAMINOPHEN-CODEINE 120-12 MG/5ML PO SOLN: 10.0000 mL | ORAL | 0 refills | Status: DC | PRN

## 2017-03-26 MED ORDER — GUAIFENESIN ER 1200 MG PO TB12: 1.0000 | ORAL_TABLET | Freq: Two times a day (BID) | ORAL | 0 refills | Status: DC

## 2017-03-26 NOTE — ED Triage Notes (Signed)
Pt complaint of "so so" cough, nasal congestion, and sore throat at night.

## 2017-03-26 NOTE — Discharge Instructions (Signed)
Return here as needed.  Increase your fluid intake and rest as much as possible.  °

## 2017-03-26 NOTE — ED Provider Notes (Signed)
[facility] Provider Note   CSN: [id number] Arrival date & time: 03/26/17  0744     History   Chief Complaint Chief Complaint  Patient presents with  . Nasal Congestion  . Sore Throat    HPI Jenny Evans is a 46 y.o. female.  HPI Patient presents to the emergency department with nasal congestion, cough with ear fullness over the last 4 days.  The patient states that she has been getting some discomfort in the left side of her neck and feels like a lymph node is swollen.  The patient states that she did not take any medications prior to arrival.  States nothing seems make the condition better or worse.  Patient denies headache, blurred vision, nausea, vomiting, chest pain, shortness of breath, fever, weakness, dizziness, back pain, or syncope.  Past Medical History:  Diagnosis Date  . Anemia   . Asthma   . Chronic pain   . GERD (gastroesophageal reflux disease)   . Migraine   . Sickle cell trait (HCC)   . Ulcer     Patient Active Problem List   Diagnosis Date Noted  . Blood loss anemia 11/12/2015  . Melena 11/12/2015  . Nonspecific abnormal electrocardiogram (ECG) (EKG) 04/03/2015  . Palpitations 04/03/2015  . Neck pain 04/03/2015  . Colitis 07/10/2014  . Absolute anemia   . Post-operative state 08/26/2013  . Rectocele 08/26/2013  . Anal or rectal pain 07/29/2013  . Headache(784.0) 02/27/2012  . ACUTE BRONCHITIS 03/25/2010  . OBESITY 09/06/2009  . INSOMNIA 01/23/2009  . HYPOKALEMIA 11/01/2008  . DYSPEPSIA&OTHER [facility] DISORDERS FUNCTION STOMACH 06/07/2008  . ALLERGIC RHINITIS 03/23/2008  . CONSTIPATION 01/20/2008  . SHORTNESS OF BREATH 08/27/2007  . TOBACCO ABUSE 03/09/2007  . Migraine 03/09/2007  . ABSCESS, TOOTH 03/09/2007  . GERD (gastroesophageal reflux disease) 03/09/2007  . HEADACHE, CLUSTER 02/25/2007  . ECZEMA 02/25/2007    Past Surgical History:  Procedure Laterality Date  . ESOPHAGOGASTRODUODENOSCOPY (EGD) WITH PROPOFOL N/A 04/04/2013    Procedure: ESOPHAGOGASTRODUODENOSCOPY (EGD) WITH PROPOFOL;  Surgeon: Willis Modena, MD;  Location: [facility];  Service: Endoscopy;  Laterality: N/A;  . EXAMINATION UNDER ANESTHESIA N/A 08/10/2013   Procedure: RECTAL EXAM UNDER ANESTHESIA;  Surgeon: Clovis Pu. Cornett, MD;  Location: [facility];  Service: General;  Laterality: N/A;  ligation  . HEMORRHOID SURGERY    . MYRINGOTOMY     age 60    OB History    No data available       Home Medications    Prior to Admission medications   Medication Sig Start Date End Date Taking? Authorizing Provider  docusate sodium (COLACE) 250 MG capsule Take 1 capsule (250 mg total) by mouth daily. Patient not taking: Reported on 02/23/2017 12/24/16   Lorre Nick, MD  esomeprazole (NEXIUM) 20 MG capsule Take 40-60 mg by mouth daily as needed.     [provider]  famotidine (PEPCID) 20 MG tablet Take 1 tablet (20 mg total) by mouth 2 (two) times daily. Patient not taking: Reported on 12/24/2016 11/04/16   Little, Ambrose Finland, MD  ferrous sulfate 325 (65 FE) MG tablet Take 1 tablet (325 mg total) by mouth daily. Patient not taking: Reported on 12/24/2016 11/04/16   Little, Ambrose Finland, MD  hydrocortisone (ANUSOL-HC) 2.5 % rectal cream Apply rectally 2 times daily 02/13/17   Everlene Farrier, PA-C  hydrocortisone-pramoxine [facility]) 2.5-1 % rectal cream Place 1 application rectally 3 (three) times daily. 02/23/17   Elson Areas, PA-C  naproxen sodium (  ANAPROX) 220 MG tablet Take 220 mg by mouth 2 (two) times daily with a meal.    [provider]  oxyCODONE-acetaminophen (PERCOCET/ROXICET) 5-325 MG tablet Take 2 tablets by mouth every 4 (four) hours as needed for severe pain. 02/23/17   Elson Areas, PA-C  Phenylephrine-Ibuprofen (ADVIL SINUS CONGESTION & PAIN) 10-200 MG TABS Take 1 tablet by mouth every 6 (six) hours as needed (congestion).    [provider]  polyethylene glycol powder  (GLYCOLAX/MIRALAX) powder Take 17 g by mouth 2 (two) times daily. 02/13/17   Everlene Farrier, PA-C  sucralfate (CARAFATE) 1 g tablet Take 1 tablet (1 g total) by mouth 4 (four) times daily -  with meals and at bedtime. Patient not taking: Reported on 02/23/2017 12/24/16   Lorre Nick, MD    Family History Family History  Problem Relation Age of Onset  . Heart disease Mother        Arrythmia  . Kidney disease Mother   . Hypertension Mother   . COPD Mother   . Diabetes Mother   . Heart disease Father   . Crohn's disease Maternal Aunt     Social History Social History  Substance Use Topics  . Smoking status: Current Some Day Smoker    Packs/day: 0.25    Years: 15.00    Types: Cigarettes  . Smokeless tobacco: Never Used  . Alcohol use No     Allergies   Hydrocodone-acetaminophen   Review of Systems Review of Systems  All other systems negative except as documented in the HPI. All pertinent positives and negatives as reviewed in the HPI. Physical Exam Updated Vital Signs BP 117/87 (BP Location: Left Arm)   Pulse 83   Temp 97.8 F (36.6 C) (Oral)   Resp 18   SpO2 100%   Physical Exam  Constitutional: She is oriented to person, place, and time. She appears well-developed and well-nourished. No distress.  HENT:  Head: Normocephalic and atraumatic.  Nose: Mucosal edema and rhinorrhea present. No sinus tenderness, septal deviation or nasal septal hematoma. No epistaxis.  Mouth/Throat: Oropharynx is clear and moist.  Eyes: Pupils are equal, round, and reactive to light.  Neck: Normal range of motion. Neck supple.  Cardiovascular: Normal rate, regular rhythm and normal heart sounds.  Exam reveals no gallop and no friction rub.   No murmur heard. Pulmonary/Chest: Effort normal and breath sounds normal. No respiratory distress. She has no wheezes.  Neurological: She is alert and oriented to person, place, and time. She exhibits normal muscle tone. Coordination normal.    Skin: Skin is warm and dry. Capillary refill takes less than 2 seconds. No rash noted. No erythema.  Psychiatric: She has a normal mood and affect. Her behavior is normal.  Nursing note and vitals reviewed.    ED Treatments / Results  Labs (all labs ordered are listed, but only abnormal results are displayed) Labs Reviewed - No data to display  EKG  EKG Interpretation None       Radiology No results found.  Procedures Procedures (including critical care time)  Medications Ordered in ED Medications - No data to display   Initial Impression / Assessment and Plan / ED Course  I have reviewed the triage vital signs and the nursing notes.  Pertinent labs & imaging results that were available during my care of the patient were reviewed by me and considered in my medical decision making (see chart for details).     Patient will be treated for  viral URI.  Told to return here as needed.  Patient agrees the plan and all questions were answered.  I did advise the patient to increase her fluid intake and rest as much as possible.   Final Clinical Impressions(s) / ED Diagnoses   Final diagnoses:  None    New Prescriptions New Prescriptions   No medications on file     Charlestine Night, Cordelia Poche 03/26/17 0981    Rolland Porter, MD 04/05/17 [phone]

## 2017-03-27 ENCOUNTER — Encounter (HOSPITAL_COMMUNITY): Payer: Self-pay | Admitting: *Deleted

## 2017-03-27 ENCOUNTER — Emergency Department (HOSPITAL_COMMUNITY)
Admission: EM | Admit: 2017-03-27 | Discharge: 2017-03-27 | Disposition: A | Discharge status: Home / Self Care | Payer: Self-pay | Attending: Emergency Medicine | Admitting: Emergency Medicine

## 2017-03-27 DIAGNOSIS — J069 Acute upper respiratory infection, unspecified: Secondary | ICD-10-CM | POA: Insufficient documentation

## 2017-03-27 DIAGNOSIS — J029 Acute pharyngitis, unspecified: Secondary | ICD-10-CM

## 2017-03-27 DIAGNOSIS — H9202 Otalgia, left ear: Secondary | ICD-10-CM

## 2017-03-27 DIAGNOSIS — Z79899 Other long term (current) drug therapy: Secondary | ICD-10-CM | POA: Insufficient documentation

## 2017-03-27 DIAGNOSIS — F17210 Nicotine dependence, cigarettes, uncomplicated: Secondary | ICD-10-CM | POA: Insufficient documentation

## 2017-03-27 DIAGNOSIS — J45909 Unspecified asthma, uncomplicated: Secondary | ICD-10-CM | POA: Insufficient documentation

## 2017-03-27 MED ORDER — KETOROLAC TROMETHAMINE 60 MG/2ML IM SOLN
60.0000 mg | Freq: Once | INTRAMUSCULAR | Status: AC
  Administered 2017-03-27: 60 mg via INTRAMUSCULAR
  Filled 2017-03-27: qty 2

## 2017-03-27 MED ORDER — DEXAMETHASONE SODIUM PHOSPHATE 10 MG/ML IJ SOLN
10.0000 mg | Freq: Once | INTRAMUSCULAR | Status: AC
Start: 2017-03-27 — End: 2017-03-27
  Administered 2017-03-27: 10 mg via INTRAMUSCULAR
  Filled 2017-03-27: qty 1

## 2017-03-27 MED ORDER — CETIRIZINE HCL 10 MG PO TABS: 10.0000 mg | ORAL_TABLET | Freq: Every day | ORAL | 1 refills | Status: DC

## 2017-03-27 MED ORDER — FLUTICASONE PROPIONATE 50 MCG/ACT NA SUSP: 2.0000 | Freq: Every day | NASAL | 2 refills | Status: DC

## 2017-03-27 NOTE — Discharge Instructions (Signed)
1. Medications: flonase, mucinex, Zyrtec, Vicks vapor rub, usual home medications 2. Treatment: rest, drink plenty of fluids, take tylenol or ibuprofen for fever control, cool mist hymidifier 3. Follow Up: Please followup with your primary doctor in 3 days for discussion of your diagnoses and further evaluation after today's visit; if you do not have a primary care doctor use the resource guide provided to find one; Return to the ER for high fevers, difficulty breathing or other concerning symptoms

## 2017-03-27 NOTE — ED Notes (Signed)
Pt did not want to wait for discharge vitals.  "I'm fine"

## 2017-03-27 NOTE — ED Provider Notes (Signed)
[facility]-EMERGENCY DEPT Provider Note   CSN: [id number] Arrival date & time: 03/27/17  0444     History   Chief Complaint Chief Complaint  Patient presents with  . Otalgia    HPI Jenny Evans is a 46 y.o. female with a hx of anemia, asthma, chronic pain, migraine, ulcer presents to the Emergency Department complaining of gradual, persistent, progressively worsening nasal congestion, post nasal drip, otalgia, sore throat onset 5 days ago.  Pt reports tonight she awoke from sleep with a worsening of her symptoms. She reports she has taken cough and cold medicine over-the-counter without relief. She denies headache, blurred vision, nausea, vomiting, chest pain, shortness of breath, fever, chills, cough, weakness, dizziness, syncope.  Nothing seems to worsen her symptoms. She reports associated left-sided lymphadenopathy. She denies recent sick contacts or travel.     The history is provided by the patient and medical records. No language interpreter was used.    Past Medical History:  Diagnosis Date  . Anemia   . Asthma   . Chronic pain   . GERD (gastroesophageal reflux disease)   . Migraine   . Sickle cell trait (HCC)   . Ulcer     Patient Active Problem List   Diagnosis Date Noted  . Blood loss anemia 11/12/2015  . Melena 11/12/2015  . Nonspecific abnormal electrocardiogram (ECG) (EKG) 04/03/2015  . Palpitations 04/03/2015  . Neck pain 04/03/2015  . Colitis 07/10/2014  . Absolute anemia   . Post-operative state 08/26/2013  . Rectocele 08/26/2013  . Anal or rectal pain 07/29/2013  . Headache(784.0) 02/27/2012  . ACUTE BRONCHITIS 03/25/2010  . OBESITY 09/06/2009  . INSOMNIA 01/23/2009  . HYPOKALEMIA 11/01/2008  . DYSPEPSIA&OTHER [facility] DISORDERS FUNCTION STOMACH 06/07/2008  . ALLERGIC RHINITIS 03/23/2008  . CONSTIPATION 01/20/2008  . SHORTNESS OF BREATH 08/27/2007  . TOBACCO ABUSE 03/09/2007  . Migraine 03/09/2007  . ABSCESS, TOOTH 03/09/2007  . GERD  (gastroesophageal reflux disease) 03/09/2007  . HEADACHE, CLUSTER 02/25/2007  . ECZEMA 02/25/2007    Past Surgical History:  Procedure Laterality Date  . ESOPHAGOGASTRODUODENOSCOPY (EGD) WITH PROPOFOL N/A 04/04/2013   Procedure: ESOPHAGOGASTRODUODENOSCOPY (EGD) WITH PROPOFOL;  Surgeon: Willis Modena, MD;  Location: [facility];  Service: Endoscopy;  Laterality: N/A;  . EXAMINATION UNDER ANESTHESIA N/A 08/10/2013   Procedure: RECTAL EXAM UNDER ANESTHESIA;  Surgeon: Clovis Pu. Cornett, MD;  Location: [facility];  Service: General;  Laterality: N/A;  ligation  . HEMORRHOID SURGERY    . MYRINGOTOMY     age 89    OB History    No data available       Home Medications    Prior to Admission medications   Medication Sig Start Date End Date Taking? Authorizing Provider  acetaminophen-codeine 120-12 MG/5ML solution Take 10 mLs by mouth every 4 (four) hours as needed for moderate pain (and cough). 03/26/17   Lawyer, Cristal Deer, PA-C  cetirizine (ZYRTEC ALLERGY) 10 MG tablet Take 1 tablet (10 mg total) by mouth daily. 03/27/17   Coley Kulikowski, Dahlia Client, PA-C  docusate sodium (COLACE) 250 MG capsule Take 1 capsule (250 mg total) by mouth daily. Patient not taking: Reported on 02/23/2017 12/24/16   Lorre Nick, MD  esomeprazole (NEXIUM) 20 MG capsule Take 40-60 mg by mouth daily as needed.     [provider]  famotidine (PEPCID) 20 MG tablet Take 1 tablet (20 mg total) by mouth 2 (two) times daily. Patient not taking: Reported on 12/24/2016 11/04/16   Little, Ambrose Finland, MD  ferrous  sulfate 325 (65 FE) MG tablet Take 1 tablet (325 mg total) by mouth daily. Patient not taking: Reported on 12/24/2016 11/04/16   Little, Ambrose Finland, MD  fluticasone [facility]) 50 MCG/ACT nasal spray Place 2 sprays into both nostrils daily. 03/27/17   Kainoa Swoboda, Dahlia Client, PA-C  Guaifenesin 1200 MG TB12 Take 1 tablet (1,200 mg total) by mouth 2 (two) times daily. 03/26/17   Lawyer,  Cristal Deer, PA-C  hydrocortisone (ANUSOL-HC) 2.5 % rectal cream Apply rectally 2 times daily 02/13/17   Everlene Farrier, PA-C  hydrocortisone-pramoxine [facility]) 2.5-1 % rectal cream Place 1 application rectally 3 (three) times daily. 02/23/17   Elson Areas, PA-C  naproxen sodium (ANAPROX) 220 MG tablet Take 220 mg by mouth 2 (two) times daily with a meal.    [provider]  oxyCODONE-acetaminophen (PERCOCET/ROXICET) 5-325 MG tablet Take 2 tablets by mouth every 4 (four) hours as needed for severe pain. 02/23/17   Elson Areas, PA-C  Phenylephrine-Ibuprofen (ADVIL SINUS CONGESTION & PAIN) 10-200 MG TABS Take 1 tablet by mouth every 6 (six) hours as needed (congestion).    [provider]  polyethylene glycol powder (GLYCOLAX/MIRALAX) powder Take 17 g by mouth 2 (two) times daily. 02/13/17   Everlene Farrier, PA-C  predniSONE (DELTASONE) 50 MG tablet Take 1 tablet (50 mg total) by mouth daily. 03/26/17   Lawyer, Cristal Deer, PA-C  sucralfate (CARAFATE) 1 g tablet Take 1 tablet (1 g total) by mouth 4 (four) times daily -  with meals and at bedtime. Patient not taking: Reported on 02/23/2017 12/24/16   Lorre Nick, MD  triamcinolone (NASACORT) 55 MCG/ACT AERO nasal inhaler Place 2 sprays into the nose daily. 03/26/17   Charlestine Night, PA-C    Family History Family History  Problem Relation Age of Onset  . Heart disease Mother        Arrythmia  . Kidney disease Mother   . Hypertension Mother   . COPD Mother   . Diabetes Mother   . Heart disease Father   . Crohn's disease Maternal Aunt     Social History Social History  Substance Use Topics  . Smoking status: Current Some Day Smoker    Packs/day: 0.25    Years: 15.00    Types: Cigarettes  . Smokeless tobacco: Never Used  . Alcohol use No     Allergies   Hydrocodone-acetaminophen   Review of Systems Review of Systems  Constitutional: Negative for appetite change, chills, fatigue and fever.  HENT:  Positive for congestion, ear pain, postnasal drip, rhinorrhea, sinus pressure and sore throat. Negative for ear discharge and mouth sores.   Eyes: Negative for visual disturbance.  Respiratory: Negative for cough, chest tightness, shortness of breath, wheezing and stridor.   Cardiovascular: Negative for chest pain, palpitations and leg swelling.  Gastrointestinal: Negative for abdominal pain, diarrhea, nausea and vomiting.  Genitourinary: Negative for dysuria, frequency, hematuria and urgency.  Musculoskeletal: Negative for arthralgias, back pain, myalgias and neck stiffness.  Skin: Negative for rash.  Neurological: Negative for syncope, light-headedness, numbness and headaches.  Hematological: Negative for adenopathy.  Psychiatric/Behavioral: The patient is not nervous/anxious.   All other systems reviewed and are negative.    Physical Exam Updated Vital Signs BP 118/87 (BP Location: Right Arm)   Pulse 68   Temp 98.3 F (36.8 C) (Oral)   Resp 18   SpO2 100%   Physical Exam  Constitutional: She appears well-developed and well-nourished. No distress.  HENT:  Head: Normocephalic and atraumatic.  Right Ear: Tympanic  membrane, external ear and ear canal normal.  Left Ear: Tympanic membrane, external ear and ear canal normal.  Nose: Mucosal edema and rhinorrhea present. No epistaxis. Right sinus exhibits no maxillary sinus tenderness and no frontal sinus tenderness. Left sinus exhibits no maxillary sinus tenderness and no frontal sinus tenderness.  Mouth/Throat: Uvula is midline and mucous membranes are normal. Mucous membranes are not pale and not cyanotic. Posterior oropharyngeal erythema present. No oropharyngeal exudate, posterior oropharyngeal edema or tonsillar abscesses.  Eyes: Pupils are equal, round, and reactive to light. Conjunctivae are normal.  Neck: Normal range of motion and full passive range of motion without pain.  No stridor Handling secretions without  difficulty Normal phonation  Cardiovascular: Normal rate and intact distal pulses.   Pulmonary/Chest: Effort normal and breath sounds normal. No stridor.  Clear and equal breath sounds without focal wheezes, rhonchi, rales  Abdominal: Soft. There is no tenderness.  Musculoskeletal: Normal range of motion.  Lymphadenopathy:       Head (right side): Tonsillar adenopathy present. No submental, no submandibular, no preauricular, no posterior auricular and no occipital adenopathy present.       Head (left side): Submandibular and tonsillar adenopathy present. No submental, no preauricular, no posterior auricular and no occipital adenopathy present.    She has cervical adenopathy.       Right cervical: No superficial cervical, no deep cervical and no posterior cervical adenopathy present.      Left cervical: Superficial cervical adenopathy present. No deep cervical and no posterior cervical adenopathy present.  Neurological: She is alert.  Skin: Skin is warm and dry. No rash noted. She is not diaphoretic.  Psychiatric: She has a normal mood and affect.  Nursing note and vitals reviewed.    ED Treatments / Results   Procedures Procedures (including critical care time)  Medications Ordered in ED Medications  dexamethasone (DECADRON) injection 10 mg (not administered)  ketorolac (TORADOL) injection 60 mg (not administered)     Initial Impression / Assessment and Plan / ED Course  I have reviewed the triage vital signs and the nursing notes.  Pertinent labs & imaging results that were available during my care of the patient were reviewed by me and considered in my medical decision making (see chart for details).     Patient with symptoms consistent with viral URI/viral pharyngitis. Clear and equal breath sounds, no cough andpatient is afebrile. No indication for chest x-ray. Patients symptoms are consistent viral etiology.  No clinical evidence of otitis media, acute sinusitis or strep  pharyngitis. Discussed these findings with patient and significant other. Discussed that antibiotics are not indicated for viral infections. Pt will be discharged with symptomatic treatment.  Verbalizes understanding and is agreeable with plan. Pt is hemodynamically stable & in NAD prior to dc.   Final Clinical Impressions(s) / ED Diagnoses   Final diagnoses:  Left ear pain  Viral URI  Viral pharyngitis    New Prescriptions New Prescriptions   CETIRIZINE (ZYRTEC ALLERGY) 10 MG TABLET    Take 1 tablet (10 mg total) by mouth daily.   FLUTICASONE (FLONASE) 50 MCG/ACT NASAL SPRAY    Place 2 sprays into both nostrils daily.     Milta Deiters 03/27/17 [id number]    Arby Barrette, MD 03/31/17 1800

## 2017-03-27 NOTE — ED Triage Notes (Signed)
C/o left earache onset 2 days ago also c/o sorethroat.

## 2017-03-31 ENCOUNTER — Encounter (HOSPITAL_COMMUNITY): Payer: Self-pay

## 2017-03-31 ENCOUNTER — Encounter (HOSPITAL_BASED_OUTPATIENT_CLINIC_OR_DEPARTMENT_OTHER): Payer: Self-pay

## 2017-03-31 ENCOUNTER — Emergency Department (HOSPITAL_BASED_OUTPATIENT_CLINIC_OR_DEPARTMENT_OTHER)
Admission: EM | Admit: 2017-03-31 | Discharge: 2017-03-31 | Disposition: A | Discharge status: Home / Self Care | Payer: Self-pay | Attending: Emergency Medicine | Admitting: Emergency Medicine

## 2017-03-31 DIAGNOSIS — J45909 Unspecified asthma, uncomplicated: Secondary | ICD-10-CM | POA: Insufficient documentation

## 2017-03-31 DIAGNOSIS — F17210 Nicotine dependence, cigarettes, uncomplicated: Secondary | ICD-10-CM | POA: Insufficient documentation

## 2017-03-31 DIAGNOSIS — R51 Headache: Secondary | ICD-10-CM | POA: Insufficient documentation

## 2017-03-31 DIAGNOSIS — Z79899 Other long term (current) drug therapy: Secondary | ICD-10-CM | POA: Insufficient documentation

## 2017-03-31 DIAGNOSIS — B349 Viral infection, unspecified: Secondary | ICD-10-CM | POA: Insufficient documentation

## 2017-03-31 DIAGNOSIS — J029 Acute pharyngitis, unspecified: Secondary | ICD-10-CM | POA: Insufficient documentation

## 2017-03-31 HISTORY — DX: Encounter for other specified aftercare: Z51.89

## 2017-03-31 LAB — RAPID STREP SCREEN (MED CTR MEBANE ONLY): Streptococcus, Group A Screen (Direct): NEGATIVE

## 2017-03-31 MED ORDER — KETOROLAC TROMETHAMINE 60 MG/2ML IM SOLN
60.0000 mg | Freq: Once | INTRAMUSCULAR | Status: AC
  Administered 2017-03-31: 60 mg via INTRAMUSCULAR
  Filled 2017-03-31: qty 2

## 2017-03-31 MED ORDER — PREDNISONE 20 MG PO TABS
20.0000 mg | ORAL_TABLET | Freq: Once | ORAL | Status: AC
  Administered 2017-03-31: 20 mg via ORAL
  Filled 2017-03-31: qty 1

## 2017-03-31 NOTE — ED Triage Notes (Addendum)
Pt c/o headache, "lymph node pain," left ear pain, congestion, same complaints when she was seen on 10/11 and again on 10/12

## 2017-03-31 NOTE — ED Provider Notes (Signed)
[facility] EMERGENCY DEPARTMENT Provider Note   CSN: [id number] Arrival date & time: 03/31/17  0035     History   Chief Complaint Chief Complaint  Patient presents with  . Headache    HPI Jenny Evans is a 46 y.o. female.  Patient is a 46 year old female with no significant past medical history presenting for evaluation of sore throat, headache, swollen lymph nodes in her neck, and not feeling well for the past several days. She denies any fevers, chills, or cough. She denies any ill contacts. She was seen at [facility] 2 days ago and was told she likely had a viral infection, but is not feeling any better.   The history is provided by the patient.  Headache   This is a new problem. Episode onset: 4 days ago. The problem occurs constantly. The problem has been gradually worsening. The headache is associated with nothing. The pain is located in the frontal region. The pain is moderate. The pain does not radiate. Pertinent negatives include no anorexia, no fever, no nausea and no vomiting. She has tried acetaminophen and NSAIDs for the symptoms. The treatment provided no relief.    Past Medical History:  Diagnosis Date  . Anemia   . Asthma   . Blood transfusion without reported diagnosis   . Chronic pain   . GERD (gastroesophageal reflux disease)   . Migraine   . Sickle cell trait (HCC)   . Ulcer     Patient Active Problem List   Diagnosis Date Noted  . Blood loss anemia 11/12/2015  . Melena 11/12/2015  . Nonspecific abnormal electrocardiogram (ECG) (EKG) 04/03/2015  . Palpitations 04/03/2015  . Neck pain 04/03/2015  . Colitis 07/10/2014  . Absolute anemia   . Post-operative state 08/26/2013  . Rectocele 08/26/2013  . Anal or rectal pain 07/29/2013  . Headache(784.0) 02/27/2012  . ACUTE BRONCHITIS 03/25/2010  . OBESITY 09/06/2009  . INSOMNIA 01/23/2009  . HYPOKALEMIA 11/01/2008  . DYSPEPSIA&OTHER [facility] DISORDERS FUNCTION STOMACH 06/07/2008  .  ALLERGIC RHINITIS 03/23/2008  . CONSTIPATION 01/20/2008  . SHORTNESS OF BREATH 08/27/2007  . TOBACCO ABUSE 03/09/2007  . Migraine 03/09/2007  . ABSCESS, TOOTH 03/09/2007  . GERD (gastroesophageal reflux disease) 03/09/2007  . HEADACHE, CLUSTER 02/25/2007  . ECZEMA 02/25/2007    Past Surgical History:  Procedure Laterality Date  . ESOPHAGOGASTRODUODENOSCOPY (EGD) WITH PROPOFOL N/A 04/04/2013   Procedure: ESOPHAGOGASTRODUODENOSCOPY (EGD) WITH PROPOFOL;  Surgeon: Willis Modena, MD;  Location: [facility];  Service: Endoscopy;  Laterality: N/A;  . EXAMINATION UNDER ANESTHESIA N/A 08/10/2013   Procedure: RECTAL EXAM UNDER ANESTHESIA;  Surgeon: Clovis Pu. Cornett, MD;  Location: [facility];  Service: General;  Laterality: N/A;  ligation  . HEMORRHOID SURGERY    . MYRINGOTOMY     age 66    OB History    No data available       Home Medications    Prior to Admission medications   Medication Sig Start Date End Date Taking? Authorizing Provider  acetaminophen-codeine 120-12 MG/5ML solution Take 10 mLs by mouth every 4 (four) hours as needed for moderate pain (and cough). 03/26/17   Lawyer, Cristal Deer, PA-C  cetirizine (ZYRTEC ALLERGY) 10 MG tablet Take 1 tablet (10 mg total) by mouth daily. 03/27/17   Muthersbaugh, Dahlia Client, PA-C  docusate sodium (COLACE) 250 MG capsule Take 1 capsule (250 mg total) by mouth daily. Patient not taking: Reported on 02/23/2017 12/24/16   Lorre Nick, MD  esomeprazole (NEXIUM) 20 MG capsule  Take 40-60 mg by mouth daily as needed.     [provider]  famotidine (PEPCID) 20 MG tablet Take 1 tablet (20 mg total) by mouth 2 (two) times daily. Patient not taking: Reported on 12/24/2016 11/04/16   Little, Ambrose Finland, MD  ferrous sulfate 325 (65 FE) MG tablet Take 1 tablet (325 mg total) by mouth daily. Patient not taking: Reported on 12/24/2016 11/04/16   Little, Ambrose Finland, MD  fluticasone [facility]) 50 MCG/ACT nasal spray Place 2  sprays into both nostrils daily. 03/27/17   Muthersbaugh, Dahlia Client, PA-C  Guaifenesin 1200 MG TB12 Take 1 tablet (1,200 mg total) by mouth 2 (two) times daily. 03/26/17   Lawyer, Cristal Deer, PA-C  hydrocortisone (ANUSOL-HC) 2.5 % rectal cream Apply rectally 2 times daily 02/13/17   Everlene Farrier, PA-C  hydrocortisone-pramoxine [facility]) 2.5-1 % rectal cream Place 1 application rectally 3 (three) times daily. 02/23/17   Elson Areas, PA-C  naproxen sodium (ANAPROX) 220 MG tablet Take 220 mg by mouth 2 (two) times daily with a meal.    [provider]  oxyCODONE-acetaminophen (PERCOCET/ROXICET) 5-325 MG tablet Take 2 tablets by mouth every 4 (four) hours as needed for severe pain. 02/23/17   Elson Areas, PA-C  Phenylephrine-Ibuprofen (ADVIL SINUS CONGESTION & PAIN) 10-200 MG TABS Take 1 tablet by mouth every 6 (six) hours as needed (congestion).    [provider]  polyethylene glycol powder (GLYCOLAX/MIRALAX) powder Take 17 g by mouth 2 (two) times daily. 02/13/17   Everlene Farrier, PA-C  predniSONE (DELTASONE) 50 MG tablet Take 1 tablet (50 mg total) by mouth daily. 03/26/17   Lawyer, Cristal Deer, PA-C  sucralfate (CARAFATE) 1 g tablet Take 1 tablet (1 g total) by mouth 4 (four) times daily -  with meals and at bedtime. Patient not taking: Reported on 02/23/2017 12/24/16   Lorre Nick, MD  triamcinolone (NASACORT) 55 MCG/ACT AERO nasal inhaler Place 2 sprays into the nose daily. 03/26/17   Charlestine Night, PA-C    Family History Family History  Problem Relation Age of Onset  . Heart disease Mother        Arrythmia  . Kidney disease Mother   . Hypertension Mother   . COPD Mother   . Diabetes Mother   . Heart disease Father   . Crohn's disease Maternal Aunt     Social History Social History  Substance Use Topics  . Smoking status: Current Some Day Smoker    Packs/day: 0.25    Years: 15.00    Types: Cigarettes  . Smokeless tobacco: Never Used  . Alcohol  use No     Allergies   Hydrocodone-acetaminophen   Review of Systems Review of Systems  Constitutional: Negative for fever.  Gastrointestinal: Negative for anorexia, nausea and vomiting.  Neurological: Positive for headaches.  All other systems reviewed and are negative.    Physical Exam Updated Vital Signs BP 120/81 (BP Location: Left Arm)   Pulse 72   Temp 98.2 F (36.8 C) (Oral)   Resp 18   Ht  (1.626 m)   Wt 59 kg (130 lb)   LMP 03/15/2017   SpO2 100%   BMI 22.31 kg/m   Physical Exam  Constitutional: She is oriented to person, place, and time. She appears well-developed and well-nourished. No distress.  HENT:  Head: Normocephalic and atraumatic.  Mouth/Throat: Oropharynx is clear and moist.  TMs are clear bilaterally.  Neck: Normal range of motion. Neck supple.  Cardiovascular: Normal rate and regular rhythm.  Exam reveals no gallop and no friction rub.   No murmur heard. Pulmonary/Chest: Effort normal and breath sounds normal. No respiratory distress. She has no wheezes.  Abdominal: Soft. Bowel sounds are normal. She exhibits no distension. There is no tenderness.  Musculoskeletal: Normal range of motion.  Lymphadenopathy:    She has no cervical adenopathy.  Neurological: She is alert and oriented to person, place, and time.  Skin: Skin is warm and dry. She is not diaphoretic.  Nursing note and vitals reviewed.    ED Treatments / Results  Labs (all labs ordered are listed, but only abnormal results are displayed) Labs Reviewed  RAPID STREP SCREEN (NOT AT [facility])  CULTURE, GROUP A STREP [facility])    EKG  EKG Interpretation None       Radiology No results found.  Procedures Procedures (including critical care time)  Medications Ordered in ED Medications - No data to display   Initial Impression / Assessment and Plan / ED Course  I have reviewed the triage vital signs and the nursing notes.  Pertinent labs & imaging results that were  available during my care of the patient were reviewed by me and considered in my medical decision making (see chart for details).  Patient presents with body aches, swollen lymph nodes, sore throat, generalized malaise. I highly suspect a viral etiology as her strep test is negative. She was prescribed prednisone 2 days ago, however has not filled this due to financial reasons. I have advised her that these illnesses need to run their course and that symptomatic treatment is all that is indicated. She will be given Toradol and the dose of prednisone today, then she can fill her previous prescription in the morning.  Final Clinical Impressions(s) / ED Diagnoses   Final diagnoses:  None    New Prescriptions New Prescriptions   No medications on file     Geoffery Lyons, MD 03/31/17 [phone]

## 2017-03-31 NOTE — ED Triage Notes (Signed)
Pt arrives to Ed with c/o HA and neck pain; pt has hx of migraines; pt denies injury; pt a&ox 4 on arrival. Pt states pain 10/10.-Monique,RN

## 2017-03-31 NOTE — Discharge Instructions (Signed)
Fill your prescriptions as previously prescribed.  Ibuprofen 600 mg rotated with Tylenol 1000 mg every 4 hours as needed for pain or fever.  Follow up with your primary Dr. If not improving in the next week.

## 2017-04-01 ENCOUNTER — Emergency Department (HOSPITAL_COMMUNITY)
Admission: EM | Admit: 2017-04-01 | Discharge: 2017-04-01 | Disposition: A | Discharge status: Home / Self Care | Payer: Self-pay | Attending: Emergency Medicine | Admitting: Emergency Medicine

## 2017-04-01 DIAGNOSIS — J029 Acute pharyngitis, unspecified: Secondary | ICD-10-CM

## 2017-04-01 DIAGNOSIS — R51 Headache: Secondary | ICD-10-CM

## 2017-04-01 DIAGNOSIS — R519 Headache, unspecified: Secondary | ICD-10-CM

## 2017-04-01 MED ORDER — SODIUM CHLORIDE 0.9 % IV BOLUS (SEPSIS): 1000.0000 mL | Freq: Once | INTRAVENOUS | Status: DC

## 2017-04-01 MED ORDER — KETOROLAC TROMETHAMINE 30 MG/ML IJ SOLN
30.0000 mg | Freq: Once | INTRAMUSCULAR | Status: AC
  Administered 2017-04-01: 30 mg via INTRAVENOUS
  Filled 2017-04-01: qty 1

## 2017-04-01 MED ORDER — DIPHENHYDRAMINE HCL 50 MG/ML IJ SOLN
25.0000 mg | Freq: Once | INTRAMUSCULAR | Status: AC
  Administered 2017-04-01: 25 mg via INTRAVENOUS
  Filled 2017-04-01: qty 1

## 2017-04-01 MED ORDER — DEXAMETHASONE SODIUM PHOSPHATE 10 MG/ML IJ SOLN
10.0000 mg | Freq: Once | INTRAMUSCULAR | Status: AC
  Administered 2017-04-01: 10 mg via INTRAVENOUS
  Filled 2017-04-01: qty 1

## 2017-04-01 NOTE — ED Provider Notes (Signed)
[facility] EMERGENCY DEPARTMENT Provider Note   CSN: [id number] Arrival date & time: 03/31/17  2324     History   Chief Complaint Chief Complaint  Patient presents with  . Headache  . Neck Pain    HPI Jenny Evans is a 46 y.o. female.  Patient presents to the emergency department for evaluation of headache and sore throat. Patient was seen in the ER 4 days ago with complaints of sinus congestion and sore throat. Since that time she has developed tenderness of her anterior throat with "glands". She reports that hurts when she swallows. This is now causing her to have a migraine headache. She has a history of recurrent migraines. She has a global throbbing headache with light sensitivity that is similar to previous migraines. No neurologic features other than the headache.      Past Medical History:  Diagnosis Date  . Anemia   . Asthma   . Blood transfusion without reported diagnosis   . Chronic pain   . GERD (gastroesophageal reflux disease)   . Migraine   . Sickle cell trait (HCC)   . Ulcer     Patient Active Problem List   Diagnosis Date Noted  . Blood loss anemia 11/12/2015  . Melena 11/12/2015  . Nonspecific abnormal electrocardiogram (ECG) (EKG) 04/03/2015  . Palpitations 04/03/2015  . Neck pain 04/03/2015  . Colitis 07/10/2014  . Absolute anemia   . Post-operative state 08/26/2013  . Rectocele 08/26/2013  . Anal or rectal pain 07/29/2013  . Headache(784.0) 02/27/2012  . ACUTE BRONCHITIS 03/25/2010  . OBESITY 09/06/2009  . INSOMNIA 01/23/2009  . HYPOKALEMIA 11/01/2008  . DYSPEPSIA&OTHER [facility] DISORDERS FUNCTION STOMACH 06/07/2008  . ALLERGIC RHINITIS 03/23/2008  . CONSTIPATION 01/20/2008  . SHORTNESS OF BREATH 08/27/2007  . TOBACCO ABUSE 03/09/2007  . Migraine 03/09/2007  . ABSCESS, TOOTH 03/09/2007  . GERD (gastroesophageal reflux disease) 03/09/2007  . HEADACHE, CLUSTER 02/25/2007  . ECZEMA 02/25/2007    Past Surgical  History:  Procedure Laterality Date  . ESOPHAGOGASTRODUODENOSCOPY (EGD) WITH PROPOFOL N/A 04/04/2013   Procedure: ESOPHAGOGASTRODUODENOSCOPY (EGD) WITH PROPOFOL;  Surgeon: Willis Modena, MD;  Location: [facility];  Service: Endoscopy;  Laterality: N/A;  . EXAMINATION UNDER ANESTHESIA N/A 08/10/2013   Procedure: RECTAL EXAM UNDER ANESTHESIA;  Surgeon: Clovis Pu. Cornett, MD;  Location: [facility];  Service: General;  Laterality: N/A;  ligation  . HEMORRHOID SURGERY    . MYRINGOTOMY     age 65    OB History    No data available       Home Medications    Prior to Admission medications   Medication Sig Start Date End Date Taking? Authorizing Provider  acetaminophen-codeine 120-12 MG/5ML solution Take 10 mLs by mouth every 4 (four) hours as needed for moderate pain (and cough). 03/26/17   Lawyer, Cristal Deer, PA-C  cetirizine (ZYRTEC ALLERGY) 10 MG tablet Take 1 tablet (10 mg total) by mouth daily. 03/27/17   Muthersbaugh, Dahlia Client, PA-C  docusate sodium (COLACE) 250 MG capsule Take 1 capsule (250 mg total) by mouth daily. Patient not taking: Reported on 02/23/2017 12/24/16   Lorre Nick, MD  esomeprazole (NEXIUM) 20 MG capsule Take 40-60 mg by mouth daily as needed.     [provider]  famotidine (PEPCID) 20 MG tablet Take 1 tablet (20 mg total) by mouth 2 (two) times daily. Patient not taking: Reported on 12/24/2016 11/04/16   Little, Ambrose Finland, MD  ferrous sulfate 325 (65 FE) MG tablet Take  1 tablet (325 mg total) by mouth daily. Patient not taking: Reported on 12/24/2016 11/04/16   Little, Ambrose Finlandachel Morgan, MD  fluticasone [facility](FLONASE) 50 MCG/ACT nasal spray Place 2 sprays into both nostrils daily. 03/27/17   Muthersbaugh, Dahlia ClientHannah, PA-C  Guaifenesin 1200 MG TB12 Take 1 tablet (1,200 mg total) by mouth 2 (two) times daily. 03/26/17   Lawyer, Cristal Deerhristopher, PA-C  hydrocortisone (ANUSOL-HC) 2.5 % rectal cream Apply rectally 2 times daily 02/13/17   Everlene Farrieransie, William, PA-C    hydrocortisone-pramoxine [facility](ANALPRAM-HC) 2.5-1 % rectal cream Place 1 application rectally 3 (three) times daily. 02/23/17   Elson AreasSofia, Leslie K, PA-C  naproxen sodium (ANAPROX) 220 MG tablet Take 220 mg by mouth 2 (two) times daily with a meal.    [provider]  oxyCODONE-acetaminophen (PERCOCET/ROXICET) 5-325 MG tablet Take 2 tablets by mouth every 4 (four) hours as needed for severe pain. 02/23/17   Elson AreasSofia, Leslie K, PA-C  Phenylephrine-Ibuprofen (ADVIL SINUS CONGESTION & PAIN) 10-200 MG TABS Take 1 tablet by mouth every 6 (six) hours as needed (congestion).    [provider]  polyethylene glycol powder (GLYCOLAX/MIRALAX) powder Take 17 g by mouth 2 (two) times daily. 02/13/17   Everlene Farrieransie, William, PA-C  predniSONE (DELTASONE) 50 MG tablet Take 1 tablet (50 mg total) by mouth daily. 03/26/17   Lawyer, Cristal Deerhristopher, PA-C  sucralfate (CARAFATE) 1 g tablet Take 1 tablet (1 g total) by mouth 4 (four) times daily -  with meals and at bedtime. Patient not taking: Reported on 02/23/2017 12/24/16   Lorre NickAllen, Anthony, MD  triamcinolone (NASACORT) 55 MCG/ACT AERO nasal inhaler Place 2 sprays into the nose daily. 03/26/17   Charlestine NightLawyer, Christopher, PA-C    Family History Family History  Problem Relation Age of Onset  . Heart disease Mother        Arrythmia  . Kidney disease Mother   . Hypertension Mother   . COPD Mother   . Diabetes Mother   . Heart disease Father   . Crohn's disease Maternal Aunt     Social History Social History  Substance Use Topics  . Smoking status: Current Some Day Smoker    Packs/day: 0.25    Years: 15.00    Types: Cigarettes  . Smokeless tobacco: Never Used  . Alcohol use No     Allergies   Hydrocodone-acetaminophen   Review of Systems Review of Systems  Constitutional: Negative for fever.  HENT: Positive for congestion and sore throat.   Neurological: Positive for headaches.  All other systems reviewed and are negative.    Physical Exam Updated  Vital Signs BP 100/76   Pulse 76   Temp 97.9 F (36.6 C) (Oral)   Resp 16   LMP 03/15/2017   SpO2 100%   Physical Exam  Constitutional: She is oriented to person, place, and time. She appears well-developed and well-nourished. No distress.  HENT:  Head: Normocephalic and atraumatic.  Right Ear: Hearing normal.  Left Ear: Hearing normal.  Nose: Nose normal.  Mouth/Throat: Oropharynx is clear and moist and mucous membranes are normal.  Eyes: Pupils are equal, round, and reactive to light. Conjunctivae and EOM are normal.  Neck: Normal range of motion. Neck supple.  Cardiovascular: Regular rhythm, S1 normal and S2 normal.  Exam reveals no gallop and no friction rub.   No murmur heard. Pulmonary/Chest: Effort normal and breath sounds normal. No respiratory distress. She exhibits no tenderness.  Abdominal: Soft. Normal appearance and bowel sounds are normal. There is no hepatosplenomegaly. There is no  tenderness. There is no rebound, no guarding, no tenderness at McBurney's point and negative Murphy's sign. No hernia.  Musculoskeletal: Normal range of motion.  Neurological: She is alert and oriented to person, place, and time. She has normal strength. No cranial nerve deficit or sensory deficit. Coordination normal. GCS eye subscore is 4. GCS verbal subscore is 5. GCS motor subscore is 6.  Skin: Skin is warm, dry and intact. No rash noted. No cyanosis.  Psychiatric: She has a normal mood and affect. Her speech is normal and behavior is normal. Thought content normal.  Nursing note and vitals reviewed.    ED Treatments / Results  Labs (all labs ordered are listed, but only abnormal results are displayed) Labs Reviewed - No data to display  EKG  EKG Interpretation None       Radiology No results found.  Procedures Procedures (including critical care time)  Medications Ordered in ED Medications  ketorolac (TORADOL) 30 MG/ML injection 30 mg (30 mg Intravenous Given 04/01/17  0232)  dexamethasone (DECADRON) injection 10 mg (10 mg Intravenous Given 04/01/17 0232)  diphenhydrAMINE (BENADRYL) injection 25 mg (25 mg Intravenous Given 04/01/17 0232)     Initial Impression / Assessment and Plan / ED Course  I have reviewed the triage vital signs and the nursing notes.  Pertinent labs & imaging results that were available during my care of the patient were reviewed by me and considered in my medical decision making (see chart for details).     Patient presented with complaints of headache that she felt was similar to previous migraine headache. She does not have any unusual neurologic features. Patient complaining of a sore throat. Oropharyngeal examination was unremarkable, no erythema, edema or exudate. She is complaining of some anterior neck tenderness, but no posterior neck tenderness or stiffness. No signs of meningismus. Patient declined IV and further workup, requested IM injections for her headache.  Final Clinical Impressions(s) / ED Diagnoses   Final diagnoses:  Bad headache  Pharyngitis, unspecified etiology    New Prescriptions New Prescriptions   No medications on file     Gilda Crease, MD 04/01/17 [phone]

## 2017-04-02 LAB — CULTURE, GROUP A STREP (THRC)

## 2017-04-06 ENCOUNTER — Emergency Department (HOSPITAL_COMMUNITY)
Admission: EM | Admit: 2017-04-06 | Discharge: 2017-04-06 | Disposition: A | Discharge status: Home / Self Care | Payer: Self-pay | Attending: Emergency Medicine | Admitting: Emergency Medicine

## 2017-04-06 ENCOUNTER — Emergency Department (HOSPITAL_COMMUNITY)
Admission: EM | Admit: 2017-04-06 | Discharge: 2017-04-06 | Disposition: A | Discharge status: Home / Self Care | Payer: Self-pay | Attending: Physician Assistant | Admitting: Physician Assistant

## 2017-04-06 ENCOUNTER — Encounter (HOSPITAL_COMMUNITY): Payer: Self-pay

## 2017-04-06 ENCOUNTER — Encounter (HOSPITAL_COMMUNITY): Payer: Self-pay | Admitting: *Deleted

## 2017-04-06 DIAGNOSIS — J45909 Unspecified asthma, uncomplicated: Secondary | ICD-10-CM | POA: Insufficient documentation

## 2017-04-06 DIAGNOSIS — F17210 Nicotine dependence, cigarettes, uncomplicated: Secondary | ICD-10-CM | POA: Insufficient documentation

## 2017-04-06 DIAGNOSIS — J069 Acute upper respiratory infection, unspecified: Secondary | ICD-10-CM | POA: Insufficient documentation

## 2017-04-06 DIAGNOSIS — Z79899 Other long term (current) drug therapy: Secondary | ICD-10-CM | POA: Insufficient documentation

## 2017-04-06 DIAGNOSIS — R591 Generalized enlarged lymph nodes: Secondary | ICD-10-CM | POA: Insufficient documentation

## 2017-04-06 DIAGNOSIS — R590 Localized enlarged lymph nodes: Secondary | ICD-10-CM | POA: Insufficient documentation

## 2017-04-06 MED ORDER — IBUPROFEN 100 MG/5ML PO SUSP
800.0000 mg | Freq: Once | ORAL | Status: AC
  Administered 2017-04-06: 800 mg via ORAL
  Filled 2017-04-06: qty 40

## 2017-04-06 MED ORDER — NAPROXEN 500 MG PO TABS: 500.0000 mg | ORAL_TABLET | Freq: Two times a day (BID) | ORAL | 0 refills | Status: DC

## 2017-04-06 MED ORDER — IBUPROFEN 100 MG/5ML PO SUSP: 800.0000 mg | Freq: Four times a day (QID) | ORAL | 0 refills | Status: DC | PRN

## 2017-04-06 MED ORDER — IBUPROFEN 800 MG PO TABS: 800.0000 mg | ORAL_TABLET | Freq: Three times a day (TID) | ORAL | 0 refills | Status: DC

## 2017-04-06 MED ORDER — IBUPROFEN 800 MG PO TABS
800.0000 mg | ORAL_TABLET | Freq: Once | ORAL | Status: DC
  Filled 2017-04-06: qty 1

## 2017-04-06 NOTE — Discharge Instructions (Signed)
You have a swollen lymph node on the left side of her neck. This is common during a viral upper respiratory tract infection. I have written you a prescription for a medicine called Naprosyn, which is a pain medicine and also helps with inflammation. Please take 500 mg in the morning and at night to help relieve pain.   Please continue to use over-the-counter cough and cold medicine for your symptoms. You can use warm compresses over the area to also alleviate the pain.  This will likely take a week or 2 to resolve. Please follow-up with your primary care provider if your symptoms do not improve.  Return to the emergency department if you develop trouble breathing, fever with worsening neck swelling. Also return if you have any new or concerning symptoms.

## 2017-04-06 NOTE — ED Triage Notes (Signed)
C/o swelling to the right side of her neck onset 2 weeks ago. States it hurts worse to swallow. States she was seen 2 weeks ago for same and told she had a virus.

## 2017-04-06 NOTE — ED Triage Notes (Signed)
States left neck lymph node swollen for 2 weeks given medication but still swollen states painful no drooling noted clear speech noted.

## 2017-04-06 NOTE — ED Provider Notes (Signed)
[facility]-EMERGENCY DEPT Provider Note   CSN: [id number] Arrival date & time: 04/06/17  0047     History   Chief Complaint Chief Complaint  Patient presents with  . Lymphadenopathy    HPI Jenny Evans is a 46 y.o. female.  The history is provided by the patient and medical records.    46 y.o. F with hx of anemia, asthma, chronic pain, sickle cell trait, GERD, presenting to the ED for lymph node swelling on the left side of her neck.  States this started around noon today.  States it makes her left ear hurt.  States she has been seen a few times already and been told she had a viral infection.  She was started on some decongestants which do seem to be helping.  She denies fever, chills.  States she did try putting a warm compress on it today but unable to sleep because of pain.  Did not try and medications.  Of note, patient seen in the ED 5x in the past 11 days for URI related symptoms.  Past Medical History:  Diagnosis Date  . Anemia   . Asthma   . Blood transfusion without reported diagnosis   . Chronic pain   . GERD (gastroesophageal reflux disease)   . Migraine   . Sickle cell trait (HCC)   . Ulcer     Patient Active Problem List   Diagnosis Date Noted  . Blood loss anemia 11/12/2015  . Melena 11/12/2015  . Nonspecific abnormal electrocardiogram (ECG) (EKG) 04/03/2015  . Palpitations 04/03/2015  . Neck pain 04/03/2015  . Colitis 07/10/2014  . Absolute anemia   . Post-operative state 08/26/2013  . Rectocele 08/26/2013  . Anal or rectal pain 07/29/2013  . Headache(784.0) 02/27/2012  . ACUTE BRONCHITIS 03/25/2010  . OBESITY 09/06/2009  . INSOMNIA 01/23/2009  . HYPOKALEMIA 11/01/2008  . DYSPEPSIA&OTHER [facility] DISORDERS FUNCTION STOMACH 06/07/2008  . ALLERGIC RHINITIS 03/23/2008  . CONSTIPATION 01/20/2008  . SHORTNESS OF BREATH 08/27/2007  . TOBACCO ABUSE 03/09/2007  . Migraine 03/09/2007  . ABSCESS, TOOTH 03/09/2007  . GERD  (gastroesophageal reflux disease) 03/09/2007  . HEADACHE, CLUSTER 02/25/2007  . ECZEMA 02/25/2007    Past Surgical History:  Procedure Laterality Date  . ESOPHAGOGASTRODUODENOSCOPY (EGD) WITH PROPOFOL N/A 04/04/2013   Procedure: ESOPHAGOGASTRODUODENOSCOPY (EGD) WITH PROPOFOL;  Surgeon: Willis Modena, MD;  Location: [facility];  Service: Endoscopy;  Laterality: N/A;  . EXAMINATION UNDER ANESTHESIA N/A 08/10/2013   Procedure: RECTAL EXAM UNDER ANESTHESIA;  Surgeon: Clovis Pu. Cornett, MD;  Location: [facility];  Service: General;  Laterality: N/A;  ligation  . HEMORRHOID SURGERY    . MYRINGOTOMY     age 66    OB History    No data available       Home Medications    Prior to Admission medications   Medication Sig Start Date End Date Taking? Authorizing Provider  acetaminophen-codeine 120-12 MG/5ML solution Take 10 mLs by mouth every 4 (four) hours as needed for moderate pain (and cough). 03/26/17   Lawyer, Cristal Deer, PA-C  cetirizine (ZYRTEC ALLERGY) 10 MG tablet Take 1 tablet (10 mg total) by mouth daily. 03/27/17   Muthersbaugh, Dahlia Client, PA-C  docusate sodium (COLACE) 250 MG capsule Take 1 capsule (250 mg total) by mouth daily. Patient not taking: Reported on 02/23/2017 12/24/16   Lorre Nick, MD  esomeprazole (NEXIUM) 20 MG capsule Take 40-60 mg by mouth daily as needed.     [provider]  famotidine (  PEPCID) 20 MG tablet Take 1 tablet (20 mg total) by mouth 2 (two) times daily. Patient not taking: Reported on 12/24/2016 11/04/16   Little, Ambrose Finlandachel Morgan, MD  ferrous sulfate 325 (65 FE) MG tablet Take 1 tablet (325 mg total) by mouth daily. Patient not taking: Reported on 12/24/2016 11/04/16   Little, Ambrose Finlandachel Morgan, MD  fluticasone [facility](FLONASE) 50 MCG/ACT nasal spray Place 2 sprays into both nostrils daily. 03/27/17   Muthersbaugh, Dahlia ClientHannah, PA-C  Guaifenesin 1200 MG TB12 Take 1 tablet (1,200 mg total) by mouth 2 (two) times daily. 03/26/17   Lawyer,  Cristal Deerhristopher, PA-C  hydrocortisone (ANUSOL-HC) 2.5 % rectal cream Apply rectally 2 times daily 02/13/17   Everlene Farrieransie, William, PA-C  hydrocortisone-pramoxine [facility](ANALPRAM-HC) 2.5-1 % rectal cream Place 1 application rectally 3 (three) times daily. 02/23/17   Elson AreasSofia, Leslie K, PA-C  naproxen sodium (ANAPROX) 220 MG tablet Take 220 mg by mouth 2 (two) times daily with a meal.    [provider]  oxyCODONE-acetaminophen (PERCOCET/ROXICET) 5-325 MG tablet Take 2 tablets by mouth every 4 (four) hours as needed for severe pain. 02/23/17   Elson AreasSofia, Leslie K, PA-C  Phenylephrine-Ibuprofen (ADVIL SINUS CONGESTION & PAIN) 10-200 MG TABS Take 1 tablet by mouth every 6 (six) hours as needed (congestion).    [provider]  polyethylene glycol powder (GLYCOLAX/MIRALAX) powder Take 17 g by mouth 2 (two) times daily. 02/13/17   Everlene Farrieransie, William, PA-C  predniSONE (DELTASONE) 50 MG tablet Take 1 tablet (50 mg total) by mouth daily. 03/26/17   Lawyer, Cristal Deerhristopher, PA-C  sucralfate (CARAFATE) 1 g tablet Take 1 tablet (1 g total) by mouth 4 (four) times daily -  with meals and at bedtime. Patient not taking: Reported on 02/23/2017 12/24/16   Lorre NickAllen, Anthony, MD  triamcinolone (NASACORT) 55 MCG/ACT AERO nasal inhaler Place 2 sprays into the nose daily. 03/26/17   Charlestine NightLawyer, Christopher, PA-C    Family History Family History  Problem Relation Age of Onset  . Heart disease Mother        Arrythmia  . Kidney disease Mother   . Hypertension Mother   . COPD Mother   . Diabetes Mother   . Heart disease Father   . Crohn's disease Maternal Aunt     Social History Social History  Substance Use Topics  . Smoking status: Current Some Day Smoker    Packs/day: 0.25    Years: 15.00    Types: Cigarettes  . Smokeless tobacco: Never Used  . Alcohol use No     Allergies   Hydrocodone-acetaminophen   Review of Systems Review of Systems  HENT: Positive for congestion.        Lymphadenopathy  All other systems  reviewed and are negative.    Physical Exam Updated Vital Signs BP 123/78 (BP Location: Right Arm)   Pulse 69   Temp 98.3 F (36.8 C) (Oral)   Resp 16   LMP 03/15/2017   SpO2 99%   Physical Exam  Constitutional: She is oriented to person, place, and time. She appears well-developed and well-nourished.  HENT:  Head: Normocephalic and atraumatic.  Right Ear: Tympanic membrane and ear canal normal.  Left Ear: Tympanic membrane and ear canal normal.  Nose: Mucosal edema and rhinorrhea (clear) present.  Mouth/Throat: Uvula is midline, oropharynx is clear and moist and mucous membranes are normal.  Tonsils overall normal in appearance bilaterally without exudate; uvula midline without evidence of peritonsillar abscess; handling secretions appropriately; no difficulty swallowing or speaking; normal phonation without stridor  Eyes: Pupils are equal, round, and reactive to light. Conjunctivae and EOM are normal.  Neck: Normal range of motion.  Cardiovascular: Normal rate, regular rhythm and normal heart sounds.   Pulmonary/Chest: Effort normal and breath sounds normal.  Abdominal: Soft. Bowel sounds are normal.  Musculoskeletal: Normal range of motion.  Lymphadenopathy:  Mildly swollen left tonsillar lymph node, mildly tender to palpation  Neurological: She is alert and oriented to person, place, and time.  Skin: Skin is warm and dry.  Psychiatric: She has a normal mood and affect.  Nursing note and vitals reviewed.    ED Treatments / Results  Labs (all labs ordered are listed, but only abnormal results are displayed) Labs Reviewed - No data to display  EKG  EKG Interpretation None       Radiology No results found.  Procedures Procedures (including critical care time)  Medications Ordered in ED Medications - No data to display   Initial Impression / Assessment and Plan / ED Course  I have reviewed the triage vital signs and the nursing notes.  Pertinent labs &  imaging results that were available during my care of the patient were reviewed by me and considered in my medical decision making (see chart for details).  46 y.o. F here with left sided neck lymphadenpathy since noon yesterday.  She has been seen 5x in the ED in the past 11 days for URI related complaints.  Patient is afebrile, non-toxic.  Has a very mildly swollen left tonsillar lymph node that is TTP.  + nasal congestion and clear rhinorrhea noted.  Tonsils normal in appearance.  No signs of PTA or deep space infection of the neck.  Handling secretions well.  I do feel like etiology of her symptoms are viral in nature and that her lymphadenopathy is likely reactive.  Discussed supportive measures at home with NSAIDs, warm compresses.  We discussed that it make take a few weeks for all the swelling to resolve.  Can follow-up with PCP.  Discussed plan with patient, she acknowledged understanding and agreed with plan of care.  Return precautions given for new or worsening symptoms.  2:21 AM At time of discharge patient refusing her discharge papers because she states "motrin doesn't work for me, I need stronger".  We have discussed narcotics are not indicated in this clinical scenario.  Final Clinical Impressions(s) / ED Diagnoses   Final diagnoses:  Lymphadenopathy    New Prescriptions New Prescriptions   IBUPROFEN (ADVIL,MOTRIN) 800 MG TABLET    Take 1 tablet (800 mg total) by mouth 3 (three) times daily.     Garlon Hatchet, PA-C 04/06/17 0209    Garlon Hatchet, PA-C 04/06/17 0307    Ward, Layla Maw, DO 04/06/17 [phone]

## 2017-04-06 NOTE — Discharge Instructions (Signed)
Your lymph node today was swollen but is likely from the virus that you have. This should decrease in size over the next few weeks. Take motrin as directed.  Can do warm compresses at home. Follow-up with your primary care doctor.

## 2017-04-06 NOTE — ED Provider Notes (Signed)
[facility] EMERGENCY DEPARTMENT Provider Note   CSN: [id number] Arrival date & time: 04/06/17  5784     History   Chief Complaint Chief Complaint  Patient presents with  . Oral Swelling  . Sore Throat    HPI Jenny Evans is a 46 y.o. female.  HPI   Jenny Evans is a 46 year old female with a history of tobacco abuse, migraine, GERD who presents to the emergency department for evaluation of left lymph node swelling and pain. Of note, patient was seen for this complaint here in the ED overnight. Was told she had mild lymphadenopathy from a viral infection and given symptomatic treatment. Patient states that she has returned because she wants "something stronger for the pain." She states that she has had tenderness on the left side of her neck for the past 2 weeks now. Pain is a 8/10 in severity, throbbing and constant. It is worse at night, wakes her from sleep particularly when she is lying on the left side. She also endorses congestion, sore throat, cough. States that she has applied heat over the neck, taken Mucinex, OTC decongestants, ibuprofen for pain with minimal relief. She states that "there must be something more he could do for my pain." She is able to adequately eat and drink despite pain. She denies fever, trouble swallowing, voice change, shortness of breath, chest pain, abdominal pain, nausea/vomiting.  Past Medical History:  Diagnosis Date  . Anemia   . Asthma   . Blood transfusion without reported diagnosis   . Chronic pain   . GERD (gastroesophageal reflux disease)   . Migraine   . Sickle cell trait (HCC)   . Ulcer     Patient Active Problem List   Diagnosis Date Noted  . Blood loss anemia 11/12/2015  . Melena 11/12/2015  . Nonspecific abnormal electrocardiogram (ECG) (EKG) 04/03/2015  . Palpitations 04/03/2015  . Neck pain 04/03/2015  . Colitis 07/10/2014  . Absolute anemia   . Post-operative state 08/26/2013  . Rectocele  08/26/2013  . Anal or rectal pain 07/29/2013  . Headache(784.0) 02/27/2012  . ACUTE BRONCHITIS 03/25/2010  . OBESITY 09/06/2009  . INSOMNIA 01/23/2009  . HYPOKALEMIA 11/01/2008  . DYSPEPSIA&OTHER [facility] DISORDERS FUNCTION STOMACH 06/07/2008  . ALLERGIC RHINITIS 03/23/2008  . CONSTIPATION 01/20/2008  . SHORTNESS OF BREATH 08/27/2007  . TOBACCO ABUSE 03/09/2007  . Migraine 03/09/2007  . ABSCESS, TOOTH 03/09/2007  . GERD (gastroesophageal reflux disease) 03/09/2007  . HEADACHE, CLUSTER 02/25/2007  . ECZEMA 02/25/2007    Past Surgical History:  Procedure Laterality Date  . ESOPHAGOGASTRODUODENOSCOPY (EGD) WITH PROPOFOL N/A 04/04/2013   Procedure: ESOPHAGOGASTRODUODENOSCOPY (EGD) WITH PROPOFOL;  Surgeon: Willis Modena, MD;  Location: [facility];  Service: Endoscopy;  Laterality: N/A;  . EXAMINATION UNDER ANESTHESIA N/A 08/10/2013   Procedure: RECTAL EXAM UNDER ANESTHESIA;  Surgeon: Clovis Pu. Cornett, MD;  Location: [facility];  Service: General;  Laterality: N/A;  ligation  . HEMORRHOID SURGERY    . MYRINGOTOMY     age 41    OB History    No data available       Home Medications    Prior to Admission medications   Medication Sig Start Date End Date Taking? Authorizing Provider  acetaminophen-codeine 120-12 MG/5ML solution Take 10 mLs by mouth every 4 (four) hours as needed for moderate pain (and cough). 03/26/17   Lawyer, Cristal Deer, PA-C  cetirizine (ZYRTEC ALLERGY) 10 MG tablet Take 1 tablet (10 mg total) by mouth daily. 03/27/17  Muthersbaugh, Dahlia ClientHannah, PA-C  docusate sodium (COLACE) 250 MG capsule Take 1 capsule (250 mg total) by mouth daily. Patient not taking: Reported on 02/23/2017 12/24/16   Lorre NickAllen, Anthony, MD  esomeprazole (NEXIUM) 20 MG capsule Take 40-60 mg by mouth daily as needed.     [provider]  famotidine (PEPCID) 20 MG tablet Take 1 tablet (20 mg total) by mouth 2 (two) times daily. Patient not taking: Reported on 12/24/2016 11/04/16    Little, Ambrose Finlandachel Morgan, MD  ferrous sulfate 325 (65 FE) MG tablet Take 1 tablet (325 mg total) by mouth daily. Patient not taking: Reported on 12/24/2016 11/04/16   Little, Ambrose Finlandachel Morgan, MD  fluticasone [facility](FLONASE) 50 MCG/ACT nasal spray Place 2 sprays into both nostrils daily. 03/27/17   Muthersbaugh, Dahlia ClientHannah, PA-C  Guaifenesin 1200 MG TB12 Take 1 tablet (1,200 mg total) by mouth 2 (two) times daily. 03/26/17   Lawyer, Cristal Deerhristopher, PA-C  hydrocortisone (ANUSOL-HC) 2.5 % rectal cream Apply rectally 2 times daily 02/13/17   Everlene Farrieransie, William, PA-C  hydrocortisone-pramoxine [facility](ANALPRAM-HC) 2.5-1 % rectal cream Place 1 application rectally 3 (three) times daily. 02/23/17   Elson AreasSofia, Leslie K, PA-C  ibuprofen (ADVIL,MOTRIN) 100 MG/5ML suspension Take 40 mLs (800 mg total) by mouth every 6 (six) hours as needed. 04/06/17   Garlon HatchetSanders, Lisa M, PA-C  naproxen (NAPROSYN) 500 MG tablet Take 1 tablet (500 mg total) by mouth 2 (two) times daily. 04/06/17   Kellie ShropshireShrosbree, Mearle Drew J, PA-C  naproxen sodium (ANAPROX) 220 MG tablet Take 220 mg by mouth 2 (two) times daily with a meal.    [provider]  oxyCODONE-acetaminophen (PERCOCET/ROXICET) 5-325 MG tablet Take 2 tablets by mouth every 4 (four) hours as needed for severe pain. 02/23/17   Elson AreasSofia, Leslie K, PA-C  Phenylephrine-Ibuprofen (ADVIL SINUS CONGESTION & PAIN) 10-200 MG TABS Take 1 tablet by mouth every 6 (six) hours as needed (congestion).    [provider]  polyethylene glycol powder (GLYCOLAX/MIRALAX) powder Take 17 g by mouth 2 (two) times daily. 02/13/17   Everlene Farrieransie, William, PA-C  predniSONE (DELTASONE) 50 MG tablet Take 1 tablet (50 mg total) by mouth daily. 03/26/17   Lawyer, Cristal Deerhristopher, PA-C  sucralfate (CARAFATE) 1 g tablet Take 1 tablet (1 g total) by mouth 4 (four) times daily -  with meals and at bedtime. Patient not taking: Reported on 02/23/2017 12/24/16   Lorre NickAllen, Anthony, MD  triamcinolone (NASACORT) 55 MCG/ACT AERO nasal inhaler Place 2 sprays into  the nose daily. 03/26/17   Charlestine NightLawyer, Christopher, PA-C    Family History Family History  Problem Relation Age of Onset  . Heart disease Mother        Arrythmia  . Kidney disease Mother   . Hypertension Mother   . COPD Mother   . Diabetes Mother   . Heart disease Father   . Crohn's disease Maternal Aunt     Social History Social History  Substance Use Topics  . Smoking status: Current Some Day Smoker    Packs/day: 0.25    Years: 15.00    Types: Cigarettes  . Smokeless tobacco: Never Used  . Alcohol use No     Allergies   Hydrocodone-acetaminophen   Review of Systems Review of Systems  Constitutional: Negative for chills, fatigue and fever.  HENT: Positive for congestion, postnasal drip, rhinorrhea and sore throat. Negative for ear pain, sinus pain, sinus pressure, trouble swallowing and voice change.   Eyes: Negative for visual disturbance.  Respiratory: Negative for cough, chest tightness, shortness of breath  and stridor.   Cardiovascular: Negative for chest pain.  Gastrointestinal: Negative for abdominal pain, nausea and vomiting.  Musculoskeletal: Positive for neck pain (left). Negative for arthralgias, gait problem, joint swelling, myalgias and neck stiffness.  Skin: Negative for color change, rash and wound.  Neurological: Negative for headaches.     Physical Exam Updated Vital Signs BP 122/85 (BP Location: Right Arm)   Pulse 85   Temp 98.4 F (36.9 C) (Oral)   Resp 18   Ht 5\' 4"  (1.626 m)   Wt 59 kg (130 lb)   LMP 03/29/2017   SpO2 100%   BMI 22.31 kg/m   Physical Exam  Constitutional: She appears well-developed and well-nourished. No distress.  Patient is tearful.  HENT:  Head: Normocephalic and atraumatic.  Clear rhinorrhea noted in the nasal cavity. No erythema of posterior pharynx. No tonsillar exudate. Mucous membranes moist. Uvula midline.  Eyes: Pupils are equal, round, and reactive to light. Conjunctivae are normal. Right eye exhibits no  discharge. Left eye exhibits no discharge.  Neck: Normal range of motion. Neck supple. No tracheal deviation present.  Left sublingual lymph node mildly swollen and tender. It is soft, freely mobile and round.  Cardiovascular: Normal rate and regular rhythm.  Exam reveals no friction rub.   No murmur heard. Pulmonary/Chest: Effort normal and breath sounds normal. No respiratory distress. She has no wheezes. She has no rales.  Neurological: She is alert. Coordination normal.  Skin: She is not diaphoretic.  Psychiatric: She has a normal mood and affect. Her behavior is normal.  Nursing note and vitals reviewed.    ED Treatments / Results  Labs (all labs ordered are listed, but only abnormal results are displayed) Labs Reviewed - No data to display  EKG  EKG Interpretation None       Radiology No results found.  Procedures Procedures (including critical care time)  Medications Ordered in ED Medications - No data to display   Initial Impression / Assessment and Plan / ED Course  I have reviewed the triage vital signs and the nursing notes.  Pertinent labs & imaging results that were available during my care of the patient were reviewed by me and considered in my medical decision making (see chart for details).     Patient with lymphadenopathy of the left sublingual lymph node which is tender to the touch. Suspect that this is reactive from her recent viral upper respiratory tract infection. She is afebrile with no overlying erythema, warmth to suggest bacterial infection. Exam is nonconcerning for PTA or RPA. No trismus or uvula deviation. Is able to drink PO fluids at bedside. Will treat symptomatically with OTC cough and cold medicine, heat packs over the neck. Discussed with patient that we do not treat painful lymphadenopathy with opioid pain medication. Have suggested that she start Naprosyn given that ibuprofen has not been working well for her. Patient is frustrated,  stating that since she was in the ER and has pain she should be given narcotic pain medicine. Have discussed return precautions including difficulty breathing, trouble moving the neck, increased swelling with fever. Have asked her if you would like to follow up with ENT, she declines. She has been counseled to follow up with her primary care provider for further concerns regarding lymphadenopathy pain. She agrees to the plan and voices understanding.   Final Clinical Impressions(s) / ED Diagnoses   Final diagnoses:  Sublingual lymphadenopathy  Viral URI    New Prescriptions New Prescriptions   NAPROXEN (  NAPROSYN) 500 MG TABLET    Take 1 tablet (500 mg total) by mouth 2 (two) times daily.     Kellie Shropshire, PA-C 04/06/17 1040    Mackuen, Cindee Salt, MD 04/06/17 1551

## 2017-04-15 ENCOUNTER — Encounter (HOSPITAL_BASED_OUTPATIENT_CLINIC_OR_DEPARTMENT_OTHER): Payer: Self-pay

## 2017-04-15 ENCOUNTER — Emergency Department (HOSPITAL_BASED_OUTPATIENT_CLINIC_OR_DEPARTMENT_OTHER)
Admission: EM | Admit: 2017-04-15 | Discharge: 2017-04-15 | Disposition: A | Discharge status: Home / Self Care | Payer: Self-pay | Attending: Emergency Medicine | Admitting: Emergency Medicine

## 2017-04-15 DIAGNOSIS — Z79899 Other long term (current) drug therapy: Secondary | ICD-10-CM | POA: Insufficient documentation

## 2017-04-15 DIAGNOSIS — K642 Third degree hemorrhoids: Secondary | ICD-10-CM | POA: Insufficient documentation

## 2017-04-15 DIAGNOSIS — J45909 Unspecified asthma, uncomplicated: Secondary | ICD-10-CM | POA: Insufficient documentation

## 2017-04-15 DIAGNOSIS — D573 Sickle-cell trait: Secondary | ICD-10-CM | POA: Insufficient documentation

## 2017-04-15 DIAGNOSIS — F17210 Nicotine dependence, cigarettes, uncomplicated: Secondary | ICD-10-CM | POA: Insufficient documentation

## 2017-04-15 MED ORDER — ACETAMINOPHEN 325 MG PO TABS
650.0000 mg | ORAL_TABLET | Freq: Once | ORAL | Status: AC
  Administered 2017-04-15: 650 mg via ORAL
  Filled 2017-04-15: qty 2

## 2017-04-15 MED ORDER — POLYETHYLENE GLYCOL 3350 17 GM/SCOOP PO POWD: 17.0000 g | Freq: Two times a day (BID) | ORAL | 0 refills | Status: DC

## 2017-04-15 MED ORDER — HYDROCORTISONE 2.5 % RE CREA: TOPICAL_CREAM | RECTAL | 1 refills | Status: DC

## 2017-04-15 NOTE — ED Triage Notes (Signed)
C/o hemorrhoids x 2 days-NAD-steady gait

## 2017-04-15 NOTE — ED Provider Notes (Signed)
[facility] EMERGENCY DEPARTMENT Provider Note   CSN: [id number] Arrival date & time: 04/15/17  1946     History   Chief Complaint Chief Complaint  Patient presents with  . Hemorrhoids    HPI Jenny Evans is a 46 y.o. female.  Jenny Evans is a 46 y.o. Female who presents to the emergency department complaining of a painful hemorrhoid ongoing for the past 4 days.  She has had problems with hemorrhoids before and has been using Preparation H.  She tells me she has pain to her bottom that is worse with a bowel movement.  She reports she is not straining to have a bowel movement and that her bowel movements are soft.  She denies feeling constipated.  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  She noticed some bright red blood on her toilet paper when wiping.  She denies fevers, abdominal pain, nausea, vomiting, diarrhea, constipation, lightheadedness, dizziness or rashes.   The history is provided by the patient and medical records. No language interpreter was used.    Past Medical History:  Diagnosis Date  . Anemia   . Asthma   . Blood transfusion without reported diagnosis   . Chronic pain   . GERD (gastroesophageal reflux disease)   . Migraine   . Sickle cell trait (HCC)   . Ulcer     Patient Active Problem List   Diagnosis Date Noted  . Blood loss anemia 11/12/2015  . Melena 11/12/2015  . Nonspecific abnormal electrocardiogram (ECG) (EKG) 04/03/2015  . Palpitations 04/03/2015  . Neck pain 04/03/2015  . Colitis 07/10/2014  . Absolute anemia   . Post-operative state 08/26/2013  . Rectocele 08/26/2013  . Anal or rectal pain 07/29/2013  . Headache(784.0) 02/27/2012  . ACUTE BRONCHITIS 03/25/2010  . OBESITY 09/06/2009  . INSOMNIA 01/23/2009  . HYPOKALEMIA 11/01/2008  . DYSPEPSIA&OTHER [facility] DISORDERS FUNCTION STOMACH 06/07/2008  . ALLERGIC RHINITIS 03/23/2008  . CONSTIPATION 01/20/2008  . SHORTNESS OF BREATH  08/27/2007  . TOBACCO ABUSE 03/09/2007  . Migraine 03/09/2007  . ABSCESS, TOOTH 03/09/2007  . GERD (gastroesophageal reflux disease) 03/09/2007  . HEADACHE, CLUSTER 02/25/2007  . ECZEMA 02/25/2007    Past Surgical History:  Procedure Laterality Date  . ESOPHAGOGASTRODUODENOSCOPY (EGD) WITH PROPOFOL N/A 04/04/2013   Procedure: ESOPHAGOGASTRODUODENOSCOPY (EGD) WITH PROPOFOL;  Surgeon: Willis Modena, MD;  Location: [facility];  Service: Endoscopy;  Laterality: N/A;  . EXAMINATION UNDER ANESTHESIA N/A 08/10/2013   Procedure: RECTAL EXAM UNDER ANESTHESIA;  Surgeon: Clovis Pu. Cornett, MD;  Location: [facility];  Service: General;  Laterality: N/A;  ligation  . HEMORRHOID SURGERY    . MYRINGOTOMY     age 88    OB History    No data available       Home Medications    Prior to Admission medications   Medication Sig Start Date End Date Taking? Authorizing Provider  acetaminophen-codeine 120-12 MG/5ML solution Take 10 mLs by mouth every 4 (four) hours as needed for moderate pain (and cough). 03/26/17   Lawyer, Cristal Deer, PA-C  cetirizine (ZYRTEC ALLERGY) 10 MG tablet Take 1 tablet (10 mg total) by mouth daily. 03/27/17   Muthersbaugh, Dahlia Client, PA-C  docusate sodium (COLACE) 250 MG capsule Take 1 capsule (250 mg total) by mouth daily. Patient not taking: Reported on 02/23/2017 12/24/16   Lorre Nick, MD  esomeprazole (NEXIUM) 20 MG capsule Take 40-60 mg by mouth daily as needed.  [provider]  famotidine (PEPCID) 20 MG tablet Take 1 tablet (20 mg total) by mouth 2 (two) times daily. Patient not taking: Reported on 12/24/2016 11/04/16   Little, Ambrose Finland, MD  ferrous sulfate 325 (65 FE) MG tablet Take 1 tablet (325 mg total) by mouth daily. Patient not taking: Reported on 12/24/2016 11/04/16   Little, Ambrose Finland, MD  fluticasone [facility]) 50 MCG/ACT nasal spray Place 2 sprays into both nostrils daily. 03/27/17   Muthersbaugh, Dahlia Client, PA-C  Guaifenesin  1200 MG TB12 Take 1 tablet (1,200 mg total) by mouth 2 (two) times daily. 03/26/17   Lawyer, Cristal Deer, PA-C  hydrocortisone (ANUSOL-HC) 2.5 % rectal cream Apply to the affected area twice daily. 04/15/17   Everlene Farrier, PA-C  oxyCODONE-acetaminophen (PERCOCET/ROXICET) 5-325 MG tablet Take 2 tablets by mouth every 4 (four) hours as needed for severe pain. 02/23/17   Elson Areas, PA-C  Phenylephrine-Ibuprofen (ADVIL SINUS CONGESTION & PAIN) 10-200 MG TABS Take 1 tablet by mouth every 6 (six) hours as needed (congestion).    [provider]  polyethylene glycol powder (GLYCOLAX/MIRALAX) powder Take 17 g by mouth 2 (two) times daily. 04/15/17   Everlene Farrier, PA-C  predniSONE (DELTASONE) 50 MG tablet Take 1 tablet (50 mg total) by mouth daily. 03/26/17   Lawyer, Cristal Deer, PA-C  sucralfate (CARAFATE) 1 g tablet Take 1 tablet (1 g total) by mouth 4 (four) times daily -  with meals and at bedtime. Patient not taking: Reported on 02/23/2017 12/24/16   Lorre Nick, MD  triamcinolone (NASACORT) 55 MCG/ACT AERO nasal inhaler Place 2 sprays into the nose daily. 03/26/17   Charlestine Night, PA-C    Family History Family History  Problem Relation Age of Onset  . Heart disease Mother        Arrythmia  . Kidney disease Mother   . Hypertension Mother   . COPD Mother   . Diabetes Mother   . Heart disease Father   . Crohn's disease Maternal Aunt     Social History Social History  Substance Use Topics  . Smoking status: Current Some Day Smoker    Packs/day: 0.25    Years: 15.00    Types: Cigarettes  . Smokeless tobacco: Never Used  . Alcohol use No     Allergies   Hydrocodone-acetaminophen   Review of Systems Review of Systems  Constitutional: Negative for fever.  Respiratory: Negative for shortness of breath.   Gastrointestinal: Positive for rectal pain. Negative for abdominal pain, diarrhea, nausea and vomiting.  Skin: Negative for rash and wound.  Neurological:  Negative for light-headedness.     Physical Exam Updated Vital Signs BP 116/78 (BP Location: Left Arm)   Pulse 95   Temp 98.2 F (36.8 C) (Oral)   Resp 18   Ht 5\' 4"  (1.626 m)   Wt 55.9 kg (123 lb 3.8 oz)   LMP 03/29/2017   SpO2 100%   BMI 21.15 kg/m   Physical Exam  Constitutional: She appears well-developed and well-nourished. No distress.  HENT:  Head: Normocephalic and atraumatic.  Eyes: Right eye exhibits no discharge. Left eye exhibits no discharge.  Neck: Neck supple.  Cardiovascular: Normal rate, regular rhythm, normal heart sounds and intact distal pulses.   Pulmonary/Chest: Effort normal and breath sounds normal. No respiratory distress.  Abdominal: Soft. She exhibits no distension. There is no tenderness. There is no guarding.  Abdomen is soft and nontender to palpation.  Genitourinary:  Genitourinary Comments: Rectal exam with female RN as chaperone.  Patient has an internal hemorrhoid that is easily reduced on my exam.  No evidence of thrombosed hemorrhoid.  No anal fissures noted.  Neurological: She is alert. Coordination normal.  Skin: Skin is warm and dry. No rash noted. She is not diaphoretic.  Psychiatric: She has a normal mood and affect. Her behavior is normal.  Nursing note and vitals reviewed.    ED Treatments / Results  Labs (all labs ordered are listed, but only abnormal results are displayed) Labs Reviewed - No data to display  EKG  EKG Interpretation None       Radiology No results found.  Procedures Procedures (including critical care time)  Medications Ordered in ED Medications  acetaminophen (TYLENOL) tablet 650 mg (not administered)     Initial Impression / Assessment and Plan / ED Course  I have reviewed the triage vital signs and the nursing notes.  Pertinent labs & imaging results that were available during my care of the patient were reviewed by me and considered in my medical decision making (see chart for  details).    This is a 46 y.o. Female who presents to the emergency department complaining of a painful hemorrhoid ongoing for the past 4 days.  She has had problems with hemorrhoids before and has been using Preparation H.  She tells me she has pain to her bottom that is worse with a bowel movement.  She reports she is not straining to have a bowel movement and that her bowel movements are soft.  She denies feeling constipated.  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  On exam the patient is afebrile nontoxic-appearing.  Her abdomen is soft and nontender to palpation.  On rectal exam she has an internal hemorrhoid that is easily reduced.  I suspect patient's heavy lifting is contributing to her hemorrhoids.  I discussed proper lifting techniques to help prevent hemorrhoids.  Will discharge with MiraLAX to ensure her bowel movements are soft as well as Anusol cream.  She will take Tylenol as needed at home for pain.  Return precautions discussed. I advised the patient to follow-up with their primary care provider this week. I advised the patient to return to the emergency department with new or worsening symptoms or new concerns. The patient verbalized understanding and agreement with plan.    Final Clinical Impressions(s) / ED Diagnoses   Final diagnoses:  Grade III hemorrhoids    New Prescriptions New Prescriptions   HYDROCORTISONE (ANUSOL-HC) 2.5 % RECTAL CREAM    Apply to the affected area twice daily.   POLYETHYLENE GLYCOL POWDER (GLYCOLAX/MIRALAX) POWDER    Take 17 g by mouth 2 (two) times daily.     Everlene FarrierDansie, Dorothee Napierkowski, PA-C 04/15/17 2025    Pricilla LovelessGoldston, Scott, MD 04/15/17 [phone]

## 2017-05-04 ENCOUNTER — Other Ambulatory Visit: Payer: Self-pay

## 2017-05-04 ENCOUNTER — Encounter (HOSPITAL_BASED_OUTPATIENT_CLINIC_OR_DEPARTMENT_OTHER): Payer: Self-pay | Admitting: *Deleted

## 2017-05-04 ENCOUNTER — Emergency Department (HOSPITAL_BASED_OUTPATIENT_CLINIC_OR_DEPARTMENT_OTHER)
Admission: EM | Admit: 2017-05-04 | Discharge: 2017-05-04 | Discharge status: Against Medical Advice | Payer: Self-pay | Attending: Emergency Medicine | Admitting: Emergency Medicine

## 2017-05-04 DIAGNOSIS — Z5321 Procedure and treatment not carried out due to patient leaving prior to being seen by health care provider: Secondary | ICD-10-CM | POA: Insufficient documentation

## 2017-05-04 DIAGNOSIS — L0231 Cutaneous abscess of buttock: Secondary | ICD-10-CM | POA: Insufficient documentation

## 2017-05-04 NOTE — ED Notes (Signed)
No answer when called for a room. 

## 2017-05-04 NOTE — ED Triage Notes (Signed)
Abscess on her right buttocks. She noticed it 4 days ago.

## 2017-06-12 ENCOUNTER — Emergency Department (HOSPITAL_BASED_OUTPATIENT_CLINIC_OR_DEPARTMENT_OTHER): Payer: Self-pay

## 2017-06-12 ENCOUNTER — Encounter (HOSPITAL_BASED_OUTPATIENT_CLINIC_OR_DEPARTMENT_OTHER): Payer: Self-pay | Admitting: Emergency Medicine

## 2017-06-12 ENCOUNTER — Other Ambulatory Visit: Payer: Self-pay

## 2017-06-12 ENCOUNTER — Emergency Department (HOSPITAL_BASED_OUTPATIENT_CLINIC_OR_DEPARTMENT_OTHER)
Admission: EM | Admit: 2017-06-12 | Discharge: 2017-06-12 | Disposition: A | Discharge status: Home / Self Care | Payer: Self-pay | Attending: Emergency Medicine | Admitting: Emergency Medicine

## 2017-06-12 DIAGNOSIS — R05 Cough: Secondary | ICD-10-CM

## 2017-06-12 DIAGNOSIS — R059 Cough, unspecified: Secondary | ICD-10-CM

## 2017-06-12 DIAGNOSIS — J069 Acute upper respiratory infection, unspecified: Secondary | ICD-10-CM | POA: Insufficient documentation

## 2017-06-12 DIAGNOSIS — J40 Bronchitis, not specified as acute or chronic: Secondary | ICD-10-CM | POA: Insufficient documentation

## 2017-06-12 DIAGNOSIS — J45909 Unspecified asthma, uncomplicated: Secondary | ICD-10-CM | POA: Insufficient documentation

## 2017-06-12 DIAGNOSIS — R0981 Nasal congestion: Secondary | ICD-10-CM | POA: Insufficient documentation

## 2017-06-12 DIAGNOSIS — F17210 Nicotine dependence, cigarettes, uncomplicated: Secondary | ICD-10-CM | POA: Insufficient documentation

## 2017-06-12 MED ORDER — PREDNISONE 10 MG PO TABS: 40.0000 mg | ORAL_TABLET | Freq: Every day | ORAL | 0 refills | Status: DC

## 2017-06-12 MED ORDER — ALBUTEROL SULFATE HFA 108 (90 BASE) MCG/ACT IN AERS
2.0000 | INHALATION_SPRAY | Freq: Once | RESPIRATORY_TRACT | Status: AC
  Administered 2017-06-12: 2 via RESPIRATORY_TRACT
  Filled 2017-06-12: qty 6.7

## 2017-06-12 MED ORDER — PREDNISONE 50 MG PO TABS
60.0000 mg | ORAL_TABLET | Freq: Once | ORAL | Status: AC
  Administered 2017-06-12: 60 mg via ORAL
  Filled 2017-06-12: qty 1

## 2017-06-12 NOTE — ED Triage Notes (Signed)
Patient states that she has had a cough x 2 days

## 2017-06-12 NOTE — ED Provider Notes (Signed)
[facility] EMERGENCY DEPARTMENT Provider Note   CSN: [id number] Arrival date & time: 06/12/17  1634     History   Chief Complaint Chief Complaint  Patient presents with  . Cough    HPI Jenny Evans is a 46 y.o. female.  HPI   Cough and congestion for 2 days. Severe dry cough. Worse at night. Throat dry. No sore throat, no known fevers.  Tried alka seltzer cold plus, mucinex, nyquil, dayquil.   Hx of asthma.  Hasn't used an inhaler in a while.    Past Medical History:  Diagnosis Date  . Anemia   . Asthma   . Blood transfusion without reported diagnosis   . Chronic pain   . GERD (gastroesophageal reflux disease)   . Migraine   . Sickle cell trait (HCC)   . Ulcer     Patient Active Problem List   Diagnosis Date Noted  . Blood loss anemia 11/12/2015  . Melena 11/12/2015  . Nonspecific abnormal electrocardiogram (ECG) (EKG) 04/03/2015  . Palpitations 04/03/2015  . Neck pain 04/03/2015  . Colitis 07/10/2014  . Absolute anemia   . Post-operative state 08/26/2013  . Rectocele 08/26/2013  . Anal or rectal pain 07/29/2013  . Headache(784.0) 02/27/2012  . ACUTE BRONCHITIS 03/25/2010  . OBESITY 09/06/2009  . INSOMNIA 01/23/2009  . HYPOKALEMIA 11/01/2008  . DYSPEPSIA&OTHER [facility] DISORDERS FUNCTION STOMACH 06/07/2008  . ALLERGIC RHINITIS 03/23/2008  . CONSTIPATION 01/20/2008  . SHORTNESS OF BREATH 08/27/2007  . TOBACCO ABUSE 03/09/2007  . Migraine 03/09/2007  . ABSCESS, TOOTH 03/09/2007  . GERD (gastroesophageal reflux disease) 03/09/2007  . HEADACHE, CLUSTER 02/25/2007  . ECZEMA 02/25/2007    Past Surgical History:  Procedure Laterality Date  . ESOPHAGOGASTRODUODENOSCOPY (EGD) WITH PROPOFOL N/A 04/04/2013   Procedure: ESOPHAGOGASTRODUODENOSCOPY (EGD) WITH PROPOFOL;  Surgeon: Willis ModenaWilliam Outlaw, MD;  Location: [facility];  Service: Endoscopy;  Laterality: N/A;  . EXAMINATION UNDER ANESTHESIA N/A 08/10/2013   Procedure: RECTAL EXAM UNDER ANESTHESIA;   Surgeon: Clovis Puhomas A. Cornett, MD;  Location: [facility];  Service: General;  Laterality: N/A;  ligation  . HEMORRHOID SURGERY    . MYRINGOTOMY     age 89    OB History    No data available       Home Medications    Prior to Admission medications   Medication Sig Start Date End Date Taking? Authorizing Provider  acetaminophen-codeine 120-12 MG/5ML solution Take 10 mLs by mouth every 4 (four) hours as needed for moderate pain (and cough). 03/26/17   Lawyer, Cristal Deerhristopher, PA-C  cetirizine (ZYRTEC ALLERGY) 10 MG tablet Take 1 tablet (10 mg total) by mouth daily. 03/27/17   Muthersbaugh, Dahlia ClientHannah, PA-C  docusate sodium (COLACE) 250 MG capsule Take 1 capsule (250 mg total) by mouth daily. Patient not taking: Reported on 02/23/2017 12/24/16   Lorre NickAllen, Anthony, MD  esomeprazole (NEXIUM) 20 MG capsule Take 40-60 mg by mouth daily as needed.     [provider]  famotidine (PEPCID) 20 MG tablet Take 1 tablet (20 mg total) by mouth 2 (two) times daily. Patient not taking: Reported on 12/24/2016 11/04/16   Little, Ambrose Finlandachel Morgan, MD  ferrous sulfate 325 (65 FE) MG tablet Take 1 tablet (325 mg total) by mouth daily. Patient not taking: Reported on 12/24/2016 11/04/16   Little, Ambrose Finlandachel Morgan, MD  fluticasone [facility](FLONASE) 50 MCG/ACT nasal spray Place 2 sprays into both nostrils daily. 03/27/17   Muthersbaugh, Dahlia ClientHannah, PA-C  Guaifenesin 1200 MG TB12 Take 1 tablet (1,200 mg  total) by mouth 2 (two) times daily. 03/26/17   Lawyer, Cristal Deer, PA-C  hydrocortisone (ANUSOL-HC) 2.5 % rectal cream Apply to the affected area twice daily. 04/15/17   Everlene Farrier, PA-C  oxyCODONE-acetaminophen (PERCOCET/ROXICET) 5-325 MG tablet Take 2 tablets by mouth every 4 (four) hours as needed for severe pain. 02/23/17   Elson Areas, PA-C  Phenylephrine-Ibuprofen (ADVIL SINUS CONGESTION & PAIN) 10-200 MG TABS Take 1 tablet by mouth every 6 (six) hours as needed (congestion).    [provider]    polyethylene glycol powder (GLYCOLAX/MIRALAX) powder Take 17 g by mouth 2 (two) times daily. 04/15/17   Everlene Farrier, PA-C  predniSONE (DELTASONE) 10 MG tablet Take 4 tablets (40 mg total) by mouth daily for 4 days. 06/12/17 06/16/17  Alvira Monday, MD  sucralfate (CARAFATE) 1 g tablet Take 1 tablet (1 g total) by mouth 4 (four) times daily -  with meals and at bedtime. Patient not taking: Reported on 02/23/2017 12/24/16   Lorre Nick, MD  triamcinolone (NASACORT) 55 MCG/ACT AERO nasal inhaler Place 2 sprays into the nose daily. 03/26/17   Charlestine Night, PA-C    Family History Family History  Problem Relation Age of Onset  . Heart disease Mother        Arrythmia  . Kidney disease Mother   . Hypertension Mother   . COPD Mother   . Diabetes Mother   . Heart disease Father   . Crohn's disease Maternal Aunt     Social History Social History   Tobacco Use  . Smoking status: Current Some Day Smoker    Packs/day: 0.25    Years: 15.00    Pack years: 3.75    Types: Cigarettes  . Smokeless tobacco: Never Used  Substance Use Topics  . Alcohol use: No  . Drug use: No     Allergies   Hydrocodone-acetaminophen   Review of Systems Review of Systems  Constitutional: Negative for fever.  HENT: Positive for congestion. Negative for sore throat.   Eyes: Negative for visual disturbance.  Respiratory: Positive for cough. Negative for shortness of breath.   Cardiovascular: Negative for chest pain.  Gastrointestinal: Negative for abdominal pain, nausea and vomiting.  Genitourinary: Negative for difficulty urinating and dysuria.  Musculoskeletal: Negative for back pain and neck pain.  Skin: Negative for rash.  Neurological: Negative for syncope and headaches.     Physical Exam Updated Vital Signs BP 106/73   Pulse 75   Temp 98.7 F (37.1 C) (Oral)   Resp 18   Ht 5\' 4"  (1.626 m)   Wt 59 kg (130 lb)   LMP 06/05/2017   SpO2 97%   BMI 22.31 kg/m   Physical Exam   Constitutional: She is oriented to person, place, and time. She appears well-developed and well-nourished. No distress.  HENT:  Head: Normocephalic and atraumatic.  Eyes: Conjunctivae and EOM are normal.  Neck: Normal range of motion.  Cardiovascular: Normal rate, regular rhythm, normal heart sounds and intact distal pulses. Exam reveals no gallop and no friction rub.  No murmur heard. Pulmonary/Chest: Effort normal and breath sounds normal. No respiratory distress. She has no wheezes. She has no rales.  Frequent cough  Abdominal: Soft. She exhibits no distension. There is no tenderness. There is no guarding.  Musculoskeletal: She exhibits no edema or tenderness.  Neurological: She is alert and oriented to person, place, and time.  Skin: Skin is warm and dry. No rash noted. She is not diaphoretic. No erythema.  Nursing  note and vitals reviewed.    ED Treatments / Results  Labs (all labs ordered are listed, but only abnormal results are displayed) Labs Reviewed - No data to display  EKG  EKG Interpretation  Date/Time:  Friday June 12 2017 16:52:21 EST Ventricular Rate:  111 PR Interval:    QRS Duration: 72 QT Interval:  342 QTC Calculation: 465 R Axis:   70 Text Interpretation:  Sinus tachycardia Atrial premature complexes More frequent PACs to prior however no other significant changes, PACs noted on multiple prior ECGs Confirmed by Alvira MondaySchlossman, Monico Sudduth ([id number]) on 06/12/2017 5:03:37 PM       Radiology Dg Chest 2 View  Result Date: 06/12/2017 CLINICAL DATA:  Cough and nasal congestion. EXAM: CHEST  2 VIEW COMPARISON:  07/30/2016 FINDINGS: The heart size and mediastinal contours are within normal limits. Both lungs are clear. The visualized skeletal structures are unremarkable. IMPRESSION: No active cardiopulmonary disease. Electronically Signed   By: Kennith CenterEric  Mansell M.D.   On: 06/12/2017 17:11    Procedures Procedures (including critical care time)  Medications Ordered  in ED Medications  albuterol (PROVENTIL HFA;VENTOLIN HFA) 108 (90 Base) MCG/ACT inhaler 2 puff (2 puffs Inhalation Given 06/12/17 1940)  predniSONE (DELTASONE) tablet 60 mg (60 mg Oral Given 06/12/17 2000)     Initial Impression / Assessment and Plan / ED Course  I have reviewed the triage vital signs and the nursing notes.  Pertinent labs & imaging results that were available during my care of the patient were reviewed by me and considered in my medical decision making (see chart for details).     46yo female presents with concern for cough, congestion. XR without signs of pneumonia.  Hx of asthma, has bronchitic cough, suspect likely viral bronchitis. Given prednisone, albuterol, rx for prednisone and recommend supportive care.   Final Clinical Impressions(s) / ED Diagnoses   Final diagnoses:  Cough  Bronchitis  Upper respiratory tract infection, unspecified type    ED Discharge Orders        Ordered    predniSONE (DELTASONE) 10 MG tablet  Daily     06/12/17 1941       Alvira MondaySchlossman, Vandora Jaskulski, MD 06/13/17 1248

## 2017-06-14 ENCOUNTER — Observation Stay (HOSPITAL_COMMUNITY)
Admission: EM | Admit: 2017-06-14 | Discharge: 2017-06-15 | Disposition: A | Discharge status: Home / Self Care | Payer: Self-pay | Attending: Internal Medicine | Admitting: Internal Medicine

## 2017-06-14 ENCOUNTER — Encounter (HOSPITAL_COMMUNITY): Payer: Self-pay | Admitting: Emergency Medicine

## 2017-06-14 ENCOUNTER — Other Ambulatory Visit: Payer: Self-pay

## 2017-06-14 ENCOUNTER — Emergency Department (HOSPITAL_COMMUNITY): Payer: Self-pay

## 2017-06-14 DIAGNOSIS — G43909 Migraine, unspecified, not intractable, without status migrainosus: Secondary | ICD-10-CM | POA: Insufficient documentation

## 2017-06-14 DIAGNOSIS — Z79899 Other long term (current) drug therapy: Secondary | ICD-10-CM | POA: Insufficient documentation

## 2017-06-14 DIAGNOSIS — F17210 Nicotine dependence, cigarettes, uncomplicated: Secondary | ICD-10-CM | POA: Insufficient documentation

## 2017-06-14 DIAGNOSIS — K219 Gastro-esophageal reflux disease without esophagitis: Secondary | ICD-10-CM | POA: Diagnosis present

## 2017-06-14 DIAGNOSIS — G8929 Other chronic pain: Secondary | ICD-10-CM | POA: Insufficient documentation

## 2017-06-14 DIAGNOSIS — D509 Iron deficiency anemia, unspecified: Secondary | ICD-10-CM | POA: Diagnosis present

## 2017-06-14 DIAGNOSIS — F17200 Nicotine dependence, unspecified, uncomplicated: Secondary | ICD-10-CM | POA: Diagnosis present

## 2017-06-14 DIAGNOSIS — G43809 Other migraine, not intractable, without status migrainosus: Secondary | ICD-10-CM | POA: Diagnosis present

## 2017-06-14 DIAGNOSIS — D573 Sickle-cell trait: Secondary | ICD-10-CM

## 2017-06-14 DIAGNOSIS — K5909 Other constipation: Secondary | ICD-10-CM | POA: Insufficient documentation

## 2017-06-14 DIAGNOSIS — E876 Hypokalemia: Principal | ICD-10-CM | POA: Diagnosis present

## 2017-06-14 DIAGNOSIS — Z8711 Personal history of peptic ulcer disease: Secondary | ICD-10-CM | POA: Insufficient documentation

## 2017-06-14 DIAGNOSIS — J45909 Unspecified asthma, uncomplicated: Secondary | ICD-10-CM | POA: Insufficient documentation

## 2017-06-14 DIAGNOSIS — R059 Cough, unspecified: Secondary | ICD-10-CM

## 2017-06-14 DIAGNOSIS — R05 Cough: Secondary | ICD-10-CM

## 2017-06-14 DIAGNOSIS — R0602 Shortness of breath: Secondary | ICD-10-CM | POA: Diagnosis present

## 2017-06-14 DIAGNOSIS — Z720 Tobacco use: Secondary | ICD-10-CM

## 2017-06-14 DIAGNOSIS — D649 Anemia, unspecified: Secondary | ICD-10-CM

## 2017-06-14 LAB — CBC
HEMATOCRIT: 23.6 % — AB (ref 36.0–46.0)
HEMATOCRIT: 25.7 % — AB (ref 36.0–46.0)
HEMOGLOBIN: 7.2 g/dL — AB (ref 12.0–15.0)
Hemoglobin: 7.9 g/dL — ABNORMAL LOW (ref 12.0–15.0)
MCH: 21.1 pg — AB (ref 26.0–34.0)
MCH: 22 pg — AB (ref 26.0–34.0)
MCHC: 30.5 g/dL (ref 30.0–36.0)
MCHC: 30.7 g/dL (ref 30.0–36.0)
MCV: 69 fL — ABNORMAL LOW (ref 78.0–100.0)
MCV: 71.6 fL — AB (ref 78.0–100.0)
Platelets: 434 10*3/uL — ABNORMAL HIGH (ref 150–400)
Platelets: 412 10*3/uL — ABNORMAL HIGH (ref 150–400)
RBC: 3.42 MIL/uL — ABNORMAL LOW (ref 3.87–5.11)
RBC: 3.59 MIL/uL — ABNORMAL LOW (ref 3.87–5.11)
RDW: 18.4 % — ABNORMAL HIGH (ref 11.5–15.5)
RDW: 19.9 % — AB (ref 11.5–15.5)
WBC: 7.7 10*3/uL (ref 4.0–10.5)
WBC: 7.9 10*3/uL (ref 4.0–10.5)

## 2017-06-14 LAB — IRON AND TIBC
Iron: 10 ug/dL — ABNORMAL LOW (ref 28–170)
Saturation Ratios: 2 % — ABNORMAL LOW (ref 10.4–31.8)
TIBC: 483 ug/dL — ABNORMAL HIGH (ref 250–450)
UIBC: 473 ug/dL

## 2017-06-14 LAB — BASIC METABOLIC PANEL
Anion gap: 8 (ref 5–15)
BUN: 6 mg/dL (ref 6–20)
CHLORIDE: 104 mmol/L (ref 101–111)
CO2: 26 mmol/L (ref 22–32)
CREATININE: 0.54 mg/dL (ref 0.44–1.00)
Calcium: 8.8 mg/dL — ABNORMAL LOW (ref 8.9–10.3)
GFR calc Af Amer: >60 mL/min (ref >60)
GFR calc non Af Amer: >60 mL/min (ref >60)
Glucose, Bld: 90 mg/dL (ref 65–99)
Potassium: 3 mmol/L — ABNORMAL LOW (ref 3.5–5.1)
Sodium: 138 mmol/L (ref 135–145)

## 2017-06-14 LAB — I-STAT BETA HCG BLOOD, ED (MC, WL, AP ONLY): I-stat hCG, quantitative: <5 m[IU]/mL (ref <5)

## 2017-06-14 LAB — RETICULOCYTES
RBC.: 3.69 MIL/uL — AB (ref 3.87–5.11)
RETIC COUNT ABSOLUTE: 48 10*3/uL (ref 19.0–186.0)
Retic Ct Pct: 1.3 % (ref 0.4–3.1)

## 2017-06-14 LAB — FERRITIN: Ferritin: 3 ng/mL — ABNORMAL LOW (ref 11–307)

## 2017-06-14 LAB — POC OCCULT BLOOD, ED: FECAL OCCULT BLD: NEGATIVE

## 2017-06-14 LAB — FOLATE: Folate: 5.4 ng/mL — ABNORMAL LOW (ref >5.9)

## 2017-06-14 LAB — TSH: TSH: 0.159 u[IU]/mL — AB (ref 0.350–4.500)

## 2017-06-14 LAB — PREPARE RBC (CROSSMATCH)

## 2017-06-14 LAB — VITAMIN B12: VITAMIN B 12: 567 pg/mL (ref 180–914)

## 2017-06-14 LAB — MAGNESIUM: Magnesium: 1.9 mg/dL (ref 1.7–2.4)

## 2017-06-14 LAB — SAVE SMEAR

## 2017-06-14 MED ORDER — IPRATROPIUM-ALBUTEROL 0.5-2.5 (3) MG/3ML IN SOLN: 3.0000 mL | RESPIRATORY_TRACT | Status: DC | PRN

## 2017-06-14 MED ORDER — METHYLPREDNISOLONE SODIUM SUCC 125 MG IJ SOLR
125.0000 mg | Freq: Once | INTRAMUSCULAR | Status: AC
  Administered 2017-06-14: 125 mg via INTRAVENOUS
  Filled 2017-06-14: qty 2

## 2017-06-14 MED ORDER — ACETAMINOPHEN 325 MG PO TABS
650.0000 mg | ORAL_TABLET | Freq: Four times a day (QID) | ORAL | Status: DC | PRN
  Administered 2017-06-14: 650 mg via ORAL
  Filled 2017-06-14: qty 2

## 2017-06-14 MED ORDER — IPRATROPIUM-ALBUTEROL 0.5-2.5 (3) MG/3ML IN SOLN
3.0000 mL | Freq: Once | RESPIRATORY_TRACT | Status: AC
  Administered 2017-06-14: 3 mL via RESPIRATORY_TRACT
  Filled 2017-06-14: qty 3

## 2017-06-14 MED ORDER — GUAIFENESIN ER 600 MG PO TB12
600.0000 mg | ORAL_TABLET | Freq: Two times a day (BID) | ORAL | Status: DC | PRN
  Administered 2017-06-14: 600 mg via ORAL
  Filled 2017-06-14: qty 1

## 2017-06-14 MED ORDER — ACETAMINOPHEN 650 MG RE SUPP: 650.0000 mg | Freq: Four times a day (QID) | RECTAL | Status: DC | PRN

## 2017-06-14 MED ORDER — SODIUM CHLORIDE 0.9 % IV SOLN
10.0000 mL/h | Freq: Once | INTRAVENOUS | Status: AC
  Administered 2017-06-14: 10 mL/h via INTRAVENOUS

## 2017-06-14 MED ORDER — POTASSIUM CHLORIDE CRYS ER 20 MEQ PO TBCR
40.0000 meq | EXTENDED_RELEASE_TABLET | Freq: Once | ORAL | Status: AC
  Administered 2017-06-14: 40 meq via ORAL
  Filled 2017-06-14: qty 2

## 2017-06-14 MED ORDER — POTASSIUM CHLORIDE CRYS ER 20 MEQ PO TBCR: 40.0000 meq | EXTENDED_RELEASE_TABLET | Freq: Two times a day (BID) | ORAL | Status: DC

## 2017-06-14 MED ORDER — FOLIC ACID 1 MG PO TABS
1.0000 mg | ORAL_TABLET | Freq: Every day | ORAL | Status: DC
  Administered 2017-06-14 – 2017-06-15 (×2): 1 mg via ORAL
  Filled 2017-06-14 (×2): qty 1

## 2017-06-14 MED ORDER — FERROUS SULFATE 325 (65 FE) MG PO TABS
325.0000 mg | ORAL_TABLET | Freq: Every day | ORAL | Status: DC
  Administered 2017-06-14 – 2017-06-15 (×2): 325 mg via ORAL
  Filled 2017-06-14 (×3): qty 1

## 2017-06-14 MED ORDER — BISACODYL 10 MG RE SUPP: 10.0000 mg | Freq: Every day | RECTAL | Status: DC | PRN

## 2017-06-14 MED ORDER — SENNOSIDES-DOCUSATE SODIUM 8.6-50 MG PO TABS: 1.0000 | ORAL_TABLET | Freq: Every evening | ORAL | Status: DC | PRN

## 2017-06-14 MED ORDER — BENZONATATE 100 MG PO CAPS
100.0000 mg | ORAL_CAPSULE | Freq: Three times a day (TID) | ORAL | Status: DC | PRN
  Administered 2017-06-14 – 2017-06-15 (×3): 100 mg via ORAL
  Filled 2017-06-14 (×3): qty 1

## 2017-06-14 MED ORDER — ONDANSETRON HCL 4 MG PO TABS: 4.0000 mg | ORAL_TABLET | Freq: Four times a day (QID) | ORAL | Status: DC | PRN

## 2017-06-14 MED ORDER — ONDANSETRON HCL 4 MG/2ML IJ SOLN: 4.0000 mg | Freq: Four times a day (QID) | INTRAMUSCULAR | Status: DC | PRN

## 2017-06-14 MED ORDER — PANTOPRAZOLE SODIUM 40 MG PO TBEC
40.0000 mg | DELAYED_RELEASE_TABLET | Freq: Every day | ORAL | Status: DC
  Administered 2017-06-14 – 2017-06-15 (×2): 40 mg via ORAL
  Filled 2017-06-14 (×2): qty 1

## 2017-06-14 MED ORDER — NICOTINE POLACRILEX 2 MG MT GUM
2.0000 mg | CHEWING_GUM | OROMUCOSAL | Status: DC | PRN
  Filled 2017-06-14: qty 1

## 2017-06-14 MED ORDER — TRAMADOL HCL 50 MG PO TABS
50.0000 mg | ORAL_TABLET | Freq: Four times a day (QID) | ORAL | Status: DC | PRN
  Administered 2017-06-14 – 2017-06-15 (×3): 50 mg via ORAL
  Filled 2017-06-14 (×4): qty 1

## 2017-06-14 NOTE — Progress Notes (Signed)
New Admission Note:   Arrival Method: Bed Mental Orientation: A&O X4 Telemetry: Initiated Assessment: Completed Skin: WDL IV: WDL Pain: 9/10 Safety Measures: Safety Fall Prevention Plan has been given, discussed and signed Admission: Completed Unit Orientation: Patient has been orientated to the room, unit and staff.  Family: At bedside  Orders have been reviewed and implemented. Will continue to monitor the patient. Call light has been placed within reach and bed alarm has been activated.    Britt BologneseAnisha Mabe RN, BSN

## 2017-06-14 NOTE — ED Triage Notes (Signed)
Pt arrives with c/o cough and fatigue for several days, [facility]. Pt reports dry cough, no sputum production, not getting any better. Also reports fatigue, has a hx of anemia. Reports compliance with iron supplements. Adds abdominal pain that she attributes to hx of ulcers. LMP 7 days ago.

## 2017-06-14 NOTE — ED Notes (Signed)
Patient transported to X-ray 

## 2017-06-14 NOTE — H&P (Signed)
History and Physical    Jenny Evans BTD:[id number] DOB: 07/07/1970 DOA: 06/14/2017   PCP: Lucianne Lei, MD   Patient coming from:  Home    Chief Complaint: Shortness of breath, generalized weakness.  HPI: Jenny Evans is a 46 y.o. female with medical history significant for sickle cell trait, asthma, tobacco smoker, peptic ulcer disease, with last endoscopy 2 years ago, presenting with 4-day history of increasing fatigue, shortness of breath, and dry cough.  She denies any fevers, but she does have intermittent chills.  She feels hot and cold.  She works at [employer], which she states this is a very cold environment, and allows her to have coughing spells.  She denies any asbestos exposure.  She denies any nausea or vomiting.  She has intermittent abdominal pain, but this is chronic, and she attributes this to her ulcer disease, and she is compliant with Nexium., denies any dysuria or gross hematuria.  Denies any back pain.  She denies any lower extremity swelling or calf pain.  She denies any recent long distance travels.  She is compliant to her iron supplementation.  She reports intermittently darker stools, which she attributes to her iron intake.  Denies any bright red blood per rectum, or hemoptysis.  She denies any heavy menses, but they are long lasting about 7 days.  She had transfusions in the past, total of 4, last about 1 year ago. Of note, she admits to pica.  She craves ice chips and starches especially corn.  She denies history of sickle cell disease.  She denies any other acute bleeding issues such as gum or nosebleed.  Denies recent surgeries.  She continues to smoke about 3-4 cigarettes a day.  She denies any alcohol or recreational drug use.  Has never been seen by a hematologist or had a bone marrow biopsy.  ED Course:  BP 108/73   Pulse 100   Temp 98.2 F (36.8 C) (Oral)   Resp (!) 26   Ht _0  (1.626 m)   Wt 59 kg (130 lb)   LMP 06/05/2017   SpO2 100%   BMI  22.31 kg/m   Given Solu-Medrol 125 mg x1, DuoNeb nebulizer with some relief of her shortness of breath and wheezing She continues to cough. Hemoglobin 7.2 MCV 69 guaiac negative.  Anemia panel pending. Transfusing 2 units of blood Potassium 3   review of Systems:  As per HPI otherwise all other systems reviewed and are negative  Past Medical History:  Diagnosis Date  . Anemia   . Asthma   . Blood transfusion without reported diagnosis   . Chronic pain   . GERD (gastroesophageal reflux disease)   . Migraine   . Sickle cell trait ([place])   . Ulcer     Past Surgical History:  Procedure Laterality Date  . ESOPHAGOGASTRODUODENOSCOPY (EGD) WITH PROPOFOL N/A 04/04/2013   Procedure: ESOPHAGOGASTRODUODENOSCOPY (EGD) WITH PROPOFOL;  Surgeon: Arta Silence, MD;  Location: [facility];  Service: Endoscopy;  Laterality: N/A;  . EXAMINATION UNDER ANESTHESIA N/A 08/10/2013   Procedure: RECTAL EXAM UNDER ANESTHESIA;  Surgeon: Joyice Faster. Cornett, MD;  Location: [facility];  Service: General;  Laterality: N/A;  ligation  . HEMORRHOID SURGERY    . MYRINGOTOMY     age 39    Social History Social History   Socioeconomic History  . Marital status: Single    Spouse name: Not on file  . Number of children: Not on file  . Years  of education: Not on file  . Highest education level: Not on file  Social Needs  . Financial resource strain: Not on file  . Food insecurity - worry: Not on file  . Food insecurity - inability: Not on file  . Transportation needs - medical: Not on file  . Transportation needs - non-medical: Not on file  Occupational History  . Not on file  Tobacco Use  . Smoking status: Current Some Day Smoker    Packs/day: 0.25    Years: 15.00    Pack years: 3.75    Types: Cigarettes  . Smokeless tobacco: Never Used  Substance and Sexual Activity  . Alcohol use: No  . Drug use: No  . Sexual activity: Not on file  Other Topics Concern  . Not on file  Social  History Narrative  . Not on file     Allergies  Allergen Reactions  . Hydrocodone-Acetaminophen Nausea And Vomiting    Patient can tolerate acetaminophen    Family History  Problem Relation Age of Onset  . Heart disease Mother        Arrythmia  . Kidney disease Mother   . Hypertension Mother   . COPD Mother   . Diabetes Mother   . Heart disease Father   . Crohn's disease Maternal Aunt       Prior to Admission medications   Medication Sig Start Date End Date Taking? Authorizing Provider  acetaminophen-codeine 120-12 MG/5ML solution Take 10 mLs by mouth every 4 (four) hours as needed for moderate pain (and cough). 03/26/17   Lawyer, Harrell Gave, PA-C  cetirizine (ZYRTEC ALLERGY) 10 MG tablet Take 1 tablet (10 mg total) by mouth daily. 03/27/17   Muthersbaugh, Jarrett Soho, PA-C  docusate sodium (COLACE) 250 MG capsule Take 1 capsule (250 mg total) by mouth daily. Patient not taking: Reported on 02/23/2017 12/24/16   Lacretia Leigh, MD  esomeprazole (NEXIUM) 20 MG capsule Take 40-60 mg by mouth daily as needed.     [provider]  famotidine (PEPCID) 20 MG tablet Take 1 tablet (20 mg total) by mouth 2 (two) times daily. Patient not taking: Reported on 12/24/2016 11/04/16   Little, Wenda Overland, MD  ferrous sulfate 325 (65 FE) MG tablet Take 1 tablet (325 mg total) by mouth daily. Patient not taking: Reported on 12/24/2016 11/04/16   Little, Wenda Overland, MD  fluticasone [facility]) 50 MCG/ACT nasal spray Place 2 sprays into both nostrils daily. 03/27/17   Muthersbaugh, Jarrett Soho, PA-C  Guaifenesin 1200 MG TB12 Take 1 tablet (1,200 mg total) by mouth 2 (two) times daily. 03/26/17   Lawyer, Harrell Gave, PA-C  hydrocortisone (ANUSOL-HC) 2.5 % rectal cream Apply to the affected area twice daily. 04/15/17   Waynetta Pean, PA-C  oxyCODONE-acetaminophen (PERCOCET/ROXICET) 5-325 MG tablet Take 2 tablets by mouth every 4 (four) hours as needed for severe pain. 02/23/17   Fransico Meadow, PA-C    Phenylephrine-Ibuprofen (ADVIL SINUS CONGESTION & PAIN) 10-200 MG TABS Take 1 tablet by mouth every 6 (six) hours as needed (congestion).    [provider]  polyethylene glycol powder (GLYCOLAX/MIRALAX) powder Take 17 g by mouth 2 (two) times daily. 04/15/17   Waynetta Pean, PA-C  predniSONE (DELTASONE) 10 MG tablet Take 4 tablets (40 mg total) by mouth daily for 4 days. 06/12/17 06/16/17  Gareth Morgan, MD  sucralfate (CARAFATE) 1 g tablet Take 1 tablet (1 g total) by mouth 4 (four) times daily -  with meals and at bedtime. Patient not taking: Reported on  02/23/2017 12/24/16   Lacretia Leigh, MD  triamcinolone (NASACORT) 55 MCG/ACT AERO nasal inhaler Place 2 sprays into the nose daily. 03/26/17   Dalia Heading, PA-C    Physical Exam:  Vitals:   06/14/17 1000 06/14/17 1015 06/14/17 1045 06/14/17 1100  BP: 108/82 103/78 101/74 108/73  Pulse:  97 (!) 101 100  Resp:  20 (!) 21 (!) 26  Temp:      TempSrc:      SpO2:  99% 98% 100%  Weight:      Height:       Constitutional: NAD, calm, uncomfortable due to cough Eyes: PERRL, lids and conjunctivae normal ENMT: Mucous membranes are moist, without exudate or lesions  Neck: normal, supple, no masses, no thyromegaly Respiratory:  Essentially clear to auscultation bilaterally, no wheezing, trace  crackles. Normal respiratory effort but coughs frequently  Cardiovascular: Regular rate and rhythm,  murmur, rubs or gallops. No extremity edema. 2+ pedal pulses. No carotid bruits.  Abdomen: Soft, non tender, No hepatosplenomegaly. Bowel sounds positive.  Musculoskeletal: no clubbing / cyanosis. Moves all extremities Skin: no jaundice, No lesions.  Neurologic: Sensation intact  Strength equal in all extremities Psychiatric:   Alert and oriented x 3. Normal mood.     Labs on Admission: I have personally reviewed following labs and imaging studies  CBC: Recent Labs  Lab 06/14/17 0438  WBC 7.7  HGB 7.2*  HCT 23.6*  MCV 69.0*   PLT 434*    Basic Metabolic Panel: Recent Labs  Lab 06/14/17 0438  NA 138  K 3.0*  CL 104  CO2 26  GLUCOSE 90  BUN 6  CREATININE 0.54  CALCIUM 8.8*    GFR: Estimated Creatinine Clearance: 77.5 mL/min (by C-G formula based on SCr of 0.54 mg/dL).  Liver Function Tests: No results for input(s): AST, ALT, ALKPHOS, BILITOT, PROT, ALBUMIN in the last 168 hours. No results for input(s): LIPASE, AMYLASE in the last 168 hours. No results for input(s): AMMONIA in the last 168 hours.  Coagulation Profile: No results for input(s): INR, PROTIME in the last 168 hours.  Cardiac Enzymes: No results for input(s): CKTOTAL, CKMB, CKMBINDEX, TROPONINI in the last 168 hours.  BNP (last 3 results) No results for input(s): PROBNP in the last 8760 hours.  HbA1C: No results for input(s): HGBA1C in the last 72 hours.  CBG: No results for input(s): GLUCAP in the last 168 hours.  Lipid Profile: No results for input(s): CHOL, HDL, LDLCALC, TRIG, CHOLHDL, LDLDIRECT in the last 72 hours.  Thyroid Function Tests: No results for input(s): TSH, T4TOTAL, FREET4, T3FREE, THYROIDAB in the last 72 hours.  Anemia Panel: No results for input(s): VITAMINB12, FOLATE, FERRITIN, TIBC, IRON, RETICCTPCT in the last 72 hours.  Urine analysis:    Component Value Date/Time   COLORURINE STRAW (A) 12/24/2016 2208   APPEARANCEUR CLEAR 12/24/2016 2208   LABSPEC 1.010 12/24/2016 2208   PHURINE 7.0 12/24/2016 [place] 12/24/2016 2208   HGBUR NEGATIVE 12/24/2016 2208   HGBUR trace-lysed 09/06/2009 1541   BILIRUBINUR NEGATIVE 12/24/2016 2208   KETONESUR NEGATIVE 12/24/2016 2208   PROTEINUR NEGATIVE 12/24/2016 2208   UROBILINOGEN 0.2 01/18/2015 0645   NITRITE NEGATIVE 12/24/2016 2208   LEUKOCYTESUR NEGATIVE 12/24/2016 2208    Sepsis Labs: _0 (procalcitonin:4,lacticidven:4) )No results found for this or any previous visit (from the past 240 hour(s)).   Radiological Exams on  Admission: Dg Chest 2 View  Result Date: 06/14/2017 CLINICAL DATA:  Cough and fatigue EXAM: CHEST  2  VIEW COMPARISON:  06/12/2017 FINDINGS: The heart size and mediastinal contours are within normal limits. Both lungs are clear. The visualized skeletal structures are unremarkable. IMPRESSION: No active cardiopulmonary disease. Electronically Signed   By: Inez Catalina M.D.   On: 06/14/2017 10:18   Dg Chest 2 View  Result Date: 06/12/2017 CLINICAL DATA:  Cough and nasal congestion. EXAM: CHEST  2 VIEW COMPARISON:  07/30/2016 FINDINGS: The heart size and mediastinal contours are within normal limits. Both lungs are clear. The visualized skeletal structures are unremarkable. IMPRESSION: No active cardiopulmonary disease. Electronically Signed   By: Misty Stanley M.D.   On: 06/12/2017 17:11    EKG: Independently reviewed.  Assessment/Plan Active Problems:   HYPOKALEMIA   TOBACCO ABUSE   Migraine variant   GERD (gastroesophageal reflux disease)   Shortness of breath   Sickle cell trait (HCC)   Symptomatic anemia   Symptomatic anemia, rule out iron deficiency anemia in a patient with a history of peptic ulcer and GERD, as well as pica.  Hemoglobin on admission 7.2 Hemoccult is pending.  Baseline appears to be between 7 and 8.  In May 2018, her hemoglobin was 6.3, receiving 2 units of blood as well.  MCV 69 anemia panel is pending.  Has been on Nexium for the last 2 years, without significant PUD or GERD flares.  Admit to telemetry observation Anemia panel Agree with transfusion of 1 unit of blood and check hemoglobin this evening, and CBC in a.m. Safe smear The patient may need iron IV while here pending on the results.  For now continue oral replenishment Continue PPI Check TSH If hemoglobin continues to drop, will obtain GI evaluation, for possible endoscopy.  At this time, it is recommended that she follow as an outpatient with GI and PCP to prevent further hospitalizations. Avoid ice  chips heavy starches  Acute respiratory distress, cough, in the setting of symptomatic anemia.  She received Solu-Medrol 125 mg x1 IV, as well as DuoNeb x1, with significant improvement of her symptoms. Continue DuoNeb every 4 hours as needed for wheezing and shortness of breath Mucinex to loosen mucus and for cough.  Chronic constipation, in the setting of chronic iron intake Continue laxatives daily.  Hypokalemia, potassium on admission was 3 Replenish with K-Dur 40 mEq x1. Recheck chemistries in a.m.  Tobacco abuse Nicorette 2 mg as needed  DVT prophylaxis: SCD Code Status:    Full Family Communication:  Discussed with patient Disposition Plan: Expect patient to be discharged to home after condition improves Consults called:    None Admission status: Telemetry OBSERVATION   Sharene Butters, PA-C [facility]   06/14/2017, 11:35 AM

## 2017-06-14 NOTE — Progress Notes (Signed)
Patient is complaining of cough unrelieved by Mucinex. MD notified. Orders placed and followed. Will continue to monitor.

## 2017-06-14 NOTE — ED Notes (Signed)
Pt given graham cracker and peanut butter with ginger ale per Ruth(RN)

## 2017-06-14 NOTE — ED Provider Notes (Signed)
[facility] EMERGENCY DEPARTMENT Provider Note   CSN: [id number] Arrival date & time: 06/14/17  4098     History   Chief Complaint Chief Complaint  Patient presents with  . Cough  . Fatigue    HPI   Blood pressure 107/67, pulse 70, temperature 98.2 F (36.8 C), temperature source Oral, resp. rate 18, height 5\' 4"  (1.626 m), weight 59 kg (130 lb), last menstrual period 06/05/2017, SpO2 100 %.  Jenny Evans is a 46 y.o. female with past medical history significant for anemia, asthma, tobacco smoker with peptic ulcer disease complaining of persistent dry cough over the course of the last 4 days with pleuritic chest pain no significant shortness of breath, fever chills, increasing peripheral edema, history of DVT/PE, recent mobilizations, calf pain or leg swelling.  She is also reporting a generalized fatigue.  She states that she has been compliant with her iron supplementation for chronic anemia.  She does not have heavy menses.  She states that she has peptic ulcer disease and is compliant with Nexium, she does not regularly follow with GI.  She sources intermittently darker than normal stool.  No bright red blood per rectum.  She had a history of 4 transfusions.   Past Medical History:  Diagnosis Date  . Anemia   . Asthma   . Blood transfusion without reported diagnosis   . Chronic pain   . GERD (gastroesophageal reflux disease)   . Migraine   . Sickle cell trait (HCC)   . Ulcer     Patient Active Problem List   Diagnosis Date Noted  . Blood loss anemia 11/12/2015  . Melena 11/12/2015  . Nonspecific abnormal electrocardiogram (ECG) (EKG) 04/03/2015  . Palpitations 04/03/2015  . Neck pain 04/03/2015  . Colitis 07/10/2014  . Absolute anemia   . Post-operative state 08/26/2013  . Rectocele 08/26/2013  . Anal or rectal pain 07/29/2013  . Headache(784.0) 02/27/2012  . ACUTE BRONCHITIS 03/25/2010  . OBESITY 09/06/2009  . INSOMNIA 01/23/2009  .  HYPOKALEMIA 11/01/2008  . DYSPEPSIA&OTHER [facility] DISORDERS FUNCTION STOMACH 06/07/2008  . ALLERGIC RHINITIS 03/23/2008  . CONSTIPATION 01/20/2008  . SHORTNESS OF BREATH 08/27/2007  . TOBACCO ABUSE 03/09/2007  . Migraine 03/09/2007  . ABSCESS, TOOTH 03/09/2007  . GERD (gastroesophageal reflux disease) 03/09/2007  . HEADACHE, CLUSTER 02/25/2007  . ECZEMA 02/25/2007    Past Surgical History:  Procedure Laterality Date  . ESOPHAGOGASTRODUODENOSCOPY (EGD) WITH PROPOFOL N/A 04/04/2013   Procedure: ESOPHAGOGASTRODUODENOSCOPY (EGD) WITH PROPOFOL;  Surgeon: Willis Modena, MD;  Location: [facility];  Service: Endoscopy;  Laterality: N/A;  . EXAMINATION UNDER ANESTHESIA N/A 08/10/2013   Procedure: RECTAL EXAM UNDER ANESTHESIA;  Surgeon: Clovis Pu. Cornett, MD;  Location: [facility];  Service: General;  Laterality: N/A;  ligation  . HEMORRHOID SURGERY    . MYRINGOTOMY     age 45    OB History    No data available       Home Medications    Prior to Admission medications   Medication Sig Start Date End Date Taking? Authorizing Provider  acetaminophen-codeine 120-12 MG/5ML solution Take 10 mLs by mouth every 4 (four) hours as needed for moderate pain (and cough). 03/26/17   Lawyer, Cristal Deer, PA-C  cetirizine (ZYRTEC ALLERGY) 10 MG tablet Take 1 tablet (10 mg total) by mouth daily. 03/27/17   Muthersbaugh, Dahlia Client, PA-C  docusate sodium (COLACE) 250 MG capsule Take 1 capsule (250 mg total) by mouth daily. Patient not taking: Reported on 02/23/2017  12/24/16   Lorre NickAllen, Anthony, MD  esomeprazole (NEXIUM) 20 MG capsule Take 40-60 mg by mouth daily as needed.     [provider]  famotidine (PEPCID) 20 MG tablet Take 1 tablet (20 mg total) by mouth 2 (two) times daily. Patient not taking: Reported on 12/24/2016 11/04/16   Little, Ambrose Finlandachel Morgan, MD  ferrous sulfate 325 (65 FE) MG tablet Take 1 tablet (325 mg total) by mouth daily. Patient not taking: Reported on 12/24/2016  11/04/16   Little, Ambrose Finlandachel Morgan, MD  fluticasone [facility](FLONASE) 50 MCG/ACT nasal spray Place 2 sprays into both nostrils daily. 03/27/17   Muthersbaugh, Dahlia ClientHannah, PA-C  Guaifenesin 1200 MG TB12 Take 1 tablet (1,200 mg total) by mouth 2 (two) times daily. 03/26/17   Lawyer, Cristal Deerhristopher, PA-C  hydrocortisone (ANUSOL-HC) 2.5 % rectal cream Apply to the affected area twice daily. 04/15/17   Everlene Farrieransie, William, PA-C  oxyCODONE-acetaminophen (PERCOCET/ROXICET) 5-325 MG tablet Take 2 tablets by mouth every 4 (four) hours as needed for severe pain. 02/23/17   Elson AreasSofia, Leslie K, PA-C  Phenylephrine-Ibuprofen (ADVIL SINUS CONGESTION & PAIN) 10-200 MG TABS Take 1 tablet by mouth every 6 (six) hours as needed (congestion).    [provider]  polyethylene glycol powder (GLYCOLAX/MIRALAX) powder Take 17 g by mouth 2 (two) times daily. 04/15/17   Everlene Farrieransie, William, PA-C  predniSONE (DELTASONE) 10 MG tablet Take 4 tablets (40 mg total) by mouth daily for 4 days. 06/12/17 06/16/17  Alvira MondaySchlossman, Erin, MD  sucralfate (CARAFATE) 1 g tablet Take 1 tablet (1 g total) by mouth 4 (four) times daily -  with meals and at bedtime. Patient not taking: Reported on 02/23/2017 12/24/16   Lorre NickAllen, Anthony, MD  triamcinolone (NASACORT) 55 MCG/ACT AERO nasal inhaler Place 2 sprays into the nose daily. 03/26/17   Charlestine NightLawyer, Christopher, PA-C    Family History Family History  Problem Relation Age of Onset  . Heart disease Mother        Arrythmia  . Kidney disease Mother   . Hypertension Mother   . COPD Mother   . Diabetes Mother   . Heart disease Father   . Crohn's disease Maternal Aunt     Social History Social History   Tobacco Use  . Smoking status: Current Some Day Smoker    Packs/day: 0.25    Years: 15.00    Pack years: 3.75    Types: Cigarettes  . Smokeless tobacco: Never Used  Substance Use Topics  . Alcohol use: No  . Drug use: No     Allergies   Hydrocodone-acetaminophen   Review of Systems Review of Systems    A complete review of systems was obtained and all systems are negative except as noted in the HPI and PMH.    Physical Exam Updated Vital Signs BP 107/67 (BP Location: Right Arm)   Pulse 70   Temp 98.2 F (36.8 C) (Oral)   Resp 18   Ht 5\' 4"  (1.626 m)   Wt 59 kg (130 lb)   LMP 06/05/2017   SpO2 100%   BMI 22.31 kg/m   Physical Exam  Constitutional: She is oriented to person, place, and time. She appears well-developed and well-nourished. No distress.  HENT:  Head: Normocephalic and atraumatic.  Mouth/Throat: Oropharynx is clear and moist.  Positive conjunctival pallor  Eyes: Conjunctivae and EOM are normal. Pupils are equal, round, and reactive to light.  Neck: Normal range of motion.  Cardiovascular: Normal rate, regular rhythm and intact distal pulses.  Pulmonary/Chest: Effort normal.  No stridor. No respiratory distress. She has wheezes. She has no rales. She exhibits no tenderness.  Trace, scattered expiratory wheezing  Abdominal: Soft. There is no tenderness.  Musculoskeletal: Normal range of motion.  Neurological: She is alert and oriented to person, place, and time.  Skin: She is not diaphoretic.  Psychiatric: She has a normal mood and affect.  Nursing note and vitals reviewed.    ED Treatments / Results  Labs (all labs ordered are listed, but only abnormal results are displayed) Labs Reviewed  CBC - Abnormal; Notable for the following components:      Result Value   RBC 3.42 (*)    Hemoglobin 7.2 (*)    HCT 23.6 (*)    MCV 69.0 (*)    MCH 21.1 (*)    RDW 18.4 (*)    Platelets 434 (*)    All other components within normal limits  BASIC METABOLIC PANEL - Abnormal; Notable for the following components:   Potassium 3.0 (*)    Calcium 8.8 (*)    All other components within normal limits  VITAMIN B12  FOLATE  IRON AND TIBC  FERRITIN  RETICULOCYTES  OCCULT BLOOD X 1 CARD TO LAB, STOOL  I-STAT BETA HCG BLOOD, ED ([facility], [facility], [facility] ONLY)  POC OCCULT BLOOD, ED   TYPE AND SCREEN  PREPARE RBC (CROSSMATCH)    EKG  EKG Interpretation None       Radiology Dg Chest 2 View  Result Date: 06/12/2017 CLINICAL DATA:  Cough and nasal congestion. EXAM: CHEST  2 VIEW COMPARISON:  07/30/2016 FINDINGS: The heart size and mediastinal contours are within normal limits. Both lungs are clear. The visualized skeletal structures are unremarkable. IMPRESSION: No active cardiopulmonary disease. Electronically Signed   By: Kennith CenterEric  Mansell M.D.   On: 06/12/2017 17:11    Procedures Procedures (including critical care time)  CRITICAL CARE Performed by: Joni ReiningNicole Aireona Torelli   Total critical care time: 35 minutes  Critical care time was exclusive of separately billable procedures and treating other patients.  Critical care was necessary to treat or prevent imminent or life-threatening deterioration.  Critical care was time spent personally by me on the following activities: development of treatment plan with patient and/or surrogate as well as nursing, discussions with consultants, evaluation of patient's response to treatment, examination of patient, obtaining history from patient or surrogate, ordering and performing treatments and interventions, ordering and review of laboratory studies, ordering and review of radiographic studies, pulse oximetry and re-evaluation of patient's condition.   Medications Ordered in ED Medications  0.9 %  sodium chloride infusion (not administered)     Initial Impression / Assessment and Plan / ED Course  I have reviewed the triage vital signs and the nursing notes.  Pertinent labs & imaging results that were available during my care of the patient were reviewed by me and considered in my medical decision making (see chart for details).     Vitals:   06/14/17 0403 06/14/17 0404 06/14/17 0937 06/14/17 0942  BP:  107/67 103/89   Pulse:  70  97  Resp:  18  16  Temp:  98.2 F (36.8 C)    TempSrc:  Oral    SpO2:  100%  100%   Weight: 59 kg (130 lb)     Height: 5\' 4"  (1.626 m)       Medications  ipratropium-albuterol (DUONEB) 0.5-2.5 (3) MG/3ML nebulizer solution 3 mL (not administered)  methylPREDNISolone sodium succinate (SOLU-MEDROL) 125 mg/2 mL injection 125 mg (  not administered)  0.9 %  sodium chloride infusion (10 mL/hr Intravenous New Bag/Given 06/14/17 0956)    AZJAH PARDO is 46 y.o. female presenting with persistent dry cough over the course of the last several days.  Lung sounds is very mild wheezing, this patient is a active daily smoker.  Likely element of COPD, recent chest x-ray negative, will repeat and give Solu-Medrol and nebulizers.  Patient with chronic anemia, hemoglobin 7.2 today.  Guaiac negative, patient states that she does not have heavy menses.  Anemia panel pending, patient with symptomatic anemia will need admission for transfusion.  PCP confirmed to be Dr. Parke Simmers, [facility] will [facility], discussed with [facility] who accepts.     Final Clinical Impressions(s) / ED Diagnoses   Final diagnoses:  None    ED Discharge Orders    None       Lynetta Mare Mardella Layman 06/14/17 1124    Arby Barrette, MD 06/19/17 1743

## 2017-06-15 DIAGNOSIS — D649 Anemia, unspecified: Secondary | ICD-10-CM

## 2017-06-15 DIAGNOSIS — R0602 Shortness of breath: Secondary | ICD-10-CM

## 2017-06-15 DIAGNOSIS — E876 Hypokalemia: Secondary | ICD-10-CM

## 2017-06-15 LAB — URINALYSIS, ROUTINE W REFLEX MICROSCOPIC
Bilirubin Urine: NEGATIVE
GLUCOSE, UA: NEGATIVE mg/dL
HGB URINE DIPSTICK: NEGATIVE
Ketones, ur: NEGATIVE mg/dL
LEUKOCYTES UA: NEGATIVE
NITRITE: NEGATIVE
PH: 6 (ref 5.0–8.0)
PROTEIN: 30 mg/dL — AB
Specific Gravity, Urine: 1.013 (ref 1.005–1.030)

## 2017-06-15 LAB — COMPREHENSIVE METABOLIC PANEL
ALK PHOS: 63 U/L (ref 38–126)
ALT: 11 U/L — AB (ref 14–54)
AST: 17 U/L (ref 15–41)
Albumin: 3.6 g/dL (ref 3.5–5.0)
Anion gap: 7 (ref 5–15)
BILIRUBIN TOTAL: 0.4 mg/dL (ref 0.3–1.2)
BUN: 7 mg/dL (ref 6–20)
CALCIUM: 9.3 mg/dL (ref 8.9–10.3)
CHLORIDE: 106 mmol/L (ref 101–111)
CO2: 23 mmol/L (ref 22–32)
CREATININE: 0.6 mg/dL (ref 0.44–1.00)
Glucose, Bld: 90 mg/dL (ref 65–99)
Potassium: 4 mmol/L (ref 3.5–5.1)
Sodium: 136 mmol/L (ref 135–145)
TOTAL PROTEIN: 6.5 g/dL (ref 6.5–8.1)

## 2017-06-15 LAB — CBC
HCT: 28.3 % — ABNORMAL LOW (ref 36.0–46.0)
Hemoglobin: 8.6 g/dL — ABNORMAL LOW (ref 12.0–15.0)
MCH: 21.9 pg — ABNORMAL LOW (ref 26.0–34.0)
MCHC: 30.4 g/dL (ref 30.0–36.0)
MCV: 72 fL — ABNORMAL LOW (ref 78.0–100.0)
Platelets: 437 10*3/uL — ABNORMAL HIGH (ref 150–400)
RBC: 3.93 MIL/uL (ref 3.87–5.11)
RDW: 19.6 % — ABNORMAL HIGH (ref 11.5–15.5)
WBC: 17.7 10*3/uL — AB (ref 4.0–10.5)

## 2017-06-15 LAB — HIV ANTIBODY (ROUTINE TESTING W REFLEX): HIV SCREEN 4TH GENERATION: NONREACTIVE

## 2017-06-15 LAB — T4, FREE: FREE T4: 0.72 ng/dL (ref 0.61–1.12)

## 2017-06-15 MED ORDER — NICOTINE 14 MG/24HR TD PT24
14.0000 mg | MEDICATED_PATCH | Freq: Every day | TRANSDERMAL | Status: DC
  Administered 2017-06-15: 14 mg via TRANSDERMAL
  Filled 2017-06-15: qty 1

## 2017-06-15 MED ORDER — ASCORBIC ACID 500 MG PO TABS: 500.0000 mg | ORAL_TABLET | Freq: Every day | ORAL | Status: DC

## 2017-06-15 MED ORDER — FOLIC ACID 1 MG PO TABS: 1.0000 mg | ORAL_TABLET | Freq: Every day | ORAL | 0 refills | Status: DC

## 2017-06-15 MED ORDER — VITAMIN C 500 MG PO TABS
500.0000 mg | ORAL_TABLET | Freq: Every day | ORAL | Status: DC
  Administered 2017-06-15: 500 mg via ORAL
  Filled 2017-06-15: qty 1

## 2017-06-15 MED ORDER — SODIUM CHLORIDE 0.9 % IV SOLN
510.0000 mg | Freq: Once | INTRAVENOUS | Status: AC
  Administered 2017-06-15: 510 mg via INTRAVENOUS
  Filled 2017-06-15: qty 17

## 2017-06-15 MED ORDER — ESOMEPRAZOLE MAGNESIUM 20 MG PO CPDR: 40.0000 mg | DELAYED_RELEASE_CAPSULE | Freq: Every day | ORAL | 0 refills | Status: DC

## 2017-06-15 MED ORDER — BENZONATATE 100 MG PO CAPS: 100.0000 mg | ORAL_CAPSULE | Freq: Three times a day (TID) | ORAL | 0 refills | Status: DC | PRN

## 2017-06-15 MED ORDER — NICOTINE 14 MG/24HR TD PT24: 14.0000 mg | MEDICATED_PATCH | Freq: Every day | TRANSDERMAL | 0 refills | Status: DC

## 2017-06-15 MED ORDER — KETOROLAC TROMETHAMINE 15 MG/ML IJ SOLN
15.0000 mg | Freq: Once | INTRAMUSCULAR | Status: AC
  Administered 2017-06-15: 15 mg via INTRAVENOUS
  Filled 2017-06-15: qty 1

## 2017-06-15 NOTE — Discharge Summary (Signed)
Physician Discharge Summary  [name] VZD:[id number] DOB: 01/12/1971 DOA: 06/14/2017  PCP: Renaye Rakers, MD  Admit date: 06/14/2017 Discharge date: 06/15/2017   Recommendations for Outpatient Follow-Up:   1. Cbc 1 week-- monitor Fe levels as well--- may not be absorbing PO and may require periodic IV Fe 2. Sickle cell screen pending 3. TSH will need to be followed   Discharge Diagnosis:   Active Problems:   HYPOKALEMIA   TOBACCO ABUSE   Migraine variant   GERD (gastroesophageal reflux disease)   Shortness of breath   Sickle cell trait (HCC)   Symptomatic anemia   Discharge disposition:  Home.  Discharge Condition: Improved.  Diet recommendation:  Regular.  Wound care: None.   History of Present Illness:   Jenny Evans is a 46 y.o. female with a Past Medical History of [place] trait, PUD, tobacco abuse comes in with weakness/SOB.  She has visited the ER 12 times in the last 6 months for various reasons.  Await labs but suspect Fe def anemia.  Will be getting PRBC and probable d/c in AM-- will need close outpatient follow up    Hospital Course by Problem:   Symptomatic anemia, due to iron deficiency anemia  -2units PRBC -PO Fe but may require periodic IV fe -sickle cell screen pending -symptoms resolved at time of d/c  Chronic constipation Continue laxatives daily.  Hypokalemia -replace  Tobacco abuse Nicotine patch  Decreased TSH -free t4 normal -outpatient follow up  Leukocytosis -given steroids in ER, no sign of infection    Medical Consultants:    None.   Discharge Exam:   Vitals:   06/15/17 0445 06/15/17 0917  BP: 106/68 100/66  Pulse: 82 75  Resp: 16 16  Temp: 98.1 F (36.7 C) 98.1 F (36.7 C)  SpO2: 100% 98%   Vitals:   06/14/17 1730 06/14/17 2034 06/15/17 0445 06/15/17 0917  BP: 119/78 (!) 94/59 106/68 100/66  Pulse: 98 85 82 75  Resp: 20 16 16 16   Temp: 98 F (36.7 C) 98.1 F (36.7 C) 98.1 F (36.7 C)  98.1 F (36.7 C)  TempSrc: Oral Oral Oral Oral  SpO2: 100% 100% 100% 98%  Weight: 57.1 kg (125 lb 14.1 oz)     Height: 5\' 4"  (1.626 m)       Gen:  NAD    The results of significant diagnostics from this hospitalization (including imaging, microbiology, ancillary and laboratory) are listed below for reference.     Procedures and Diagnostic Studies:   Dg Chest 2 View  Result Date: 06/14/2017 CLINICAL DATA:  Cough and fatigue EXAM: CHEST  2 VIEW COMPARISON:  06/12/2017 FINDINGS: The heart size and mediastinal contours are within normal limits. Both lungs are clear. The visualized skeletal structures are unremarkable. IMPRESSION: No active cardiopulmonary disease. Electronically Signed   By: Alcide Clever M.D.   On: 06/14/2017 10:18     Labs:   Basic Metabolic Panel: Recent Labs  Lab 06/14/17 0438 06/14/17 1603 06/15/17 0445  NA 138  --  136  K 3.0*  --  4.0  CL 104  --  106  CO2 26  --  23  GLUCOSE 90  --  90  BUN 6  --  7  CREATININE 0.54  --  0.60  CALCIUM 8.8*  --  9.3  MG  --  1.9  --    GFR Estimated Creatinine Clearance: 77.5 mL/min (by C-G formula based on SCr of 0.6 mg/dL). Liver Function  Tests: Recent Labs  Lab 06/15/17 0445  AST 17  ALT 11*  ALKPHOS 63  BILITOT 0.4  PROT 6.5  ALBUMIN 3.6   No results for input(s): LIPASE, AMYLASE in the last 168 hours. No results for input(s): AMMONIA in the last 168 hours. Coagulation profile No results for input(s): INR, PROTIME in the last 168 hours.  CBC: Recent Labs  Lab 06/14/17 0438 06/14/17 1603 06/15/17 0445  WBC 7.7 7.9 17.7*  HGB 7.2* 7.9* 8.6*  HCT 23.6* 25.7* 28.3*  MCV 69.0* 71.6* 72.0*  PLT 434* 412* 437*   Cardiac Enzymes: No results for input(s): CKTOTAL, CKMB, CKMBINDEX, TROPONINI in the last 168 hours. BNP: Invalid input(s): POCBNP CBG: No results for input(s): GLUCAP in the last 168 hours. D-Dimer No results for input(s): DDIMER in the last 72 hours. Hgb A1c No results for  input(s): HGBA1C in the last 72 hours. Lipid Profile No results for input(s): CHOL, HDL, LDLCALC, TRIG, CHOLHDL, LDLDIRECT in the last 72 hours. Thyroid function studies Recent Labs    06/14/17 1837  TSH 0.159*   Anemia work up Recent Labs    06/14/17 0948  VITAMINB12 567  FOLATE 5.4*  FERRITIN 3*  TIBC 483*  IRON 10*  RETICCTPCT 1.3   Microbiology No results found for this or any previous visit (from the past 240 hour(s)).   Discharge Instructions:   Discharge Instructions    Diet - low sodium heart healthy   Complete by:  As directed    Discharge instructions   Complete by:  As directed    Continue to take iron daily but take with vitamin C Smoking cessation CBC 2 weeks   Increase activity slowly   Complete by:  As directed      Allergies as of 06/15/2017      Reactions   Hydrocodone-acetaminophen Nausea And Vomiting   Patient can tolerate acetaminophen      Medication List    STOP taking these medications   acetaminophen-codeine 120-12 MG/5ML solution   famotidine 20 MG tablet Commonly known as:  PEPCID   Guaifenesin 1200 MG Tb12   hydrocortisone 2.5 % rectal cream Commonly known as:  ANUSOL-HC   oxyCODONE-acetaminophen 5-325 MG tablet Commonly known as:  PERCOCET/ROXICET   polyethylene glycol powder powder Commonly known as:  GLYCOLAX/MIRALAX   predniSONE 10 MG tablet Commonly known as:  DELTASONE   sucralfate 1 g tablet Commonly known as:  CARAFATE   triamcinolone 55 MCG/ACT Aero nasal inhaler Commonly known as:  NASACORT     TAKE these medications   ascorbic acid 500 MG tablet Commonly known as:  VITAMIN C Take 1 tablet (500 mg total) by mouth daily. Start taking on:  06/16/2017   benzonatate 100 MG capsule Commonly known as:  TESSALON Take 1 capsule (100 mg total) by mouth 3 (three) times daily as needed for cough.   cetirizine 10 MG tablet Commonly known as:  ZYRTEC ALLERGY Take 1 tablet (10 mg total) by mouth daily.     docusate sodium 250 MG capsule Commonly known as:  COLACE Take 1 capsule (250 mg total) by mouth daily.   esomeprazole 20 MG capsule Commonly known as:  NEXIUM Take 2-3 capsules (40-60 mg total) by mouth daily at 12 noon. What changed:    when to take this  reasons to take this   ferrous sulfate 325 (65 FE) MG tablet Take 1 tablet (325 mg total) by mouth daily.   fluticasone 50 MCG/ACT nasal spray Commonly known as:  FLONASE Place 2 sprays into both nostrils daily.   folic acid 1 MG tablet Commonly known as:  FOLVITE Take 1 tablet (1 mg total) by mouth daily. Start taking on:  06/16/2017   nicotine 14 mg/24hr patch Commonly known as:  NICODERM CQ - dosed in mg/24 hours Place 1 patch (14 mg total) onto the skin daily. Start taking on:  06/16/2017      Follow-up Information    [facility]. Go to.   Why:  Your appointment is scheduled for Thursday, January 10,2019 at 1:30pm Contact information: [address] [place] [address] [phone]           Time coordinating discharge: 35 min  Signed:  Joseph Art   [facility] 06/15/2017, 5:22 PM

## 2017-06-15 NOTE — Care Management Note (Addendum)
Case Management Note  Patient Details  Name: Kathryne GinChiha Z Sortor MRN: [id number] Date of Birth: 05/23/1971  Subjective/Objective:    Admitted with shortness of breath and weakness.                 Action/Plan:  Case manager [facility], June 25, 2017 at 1:30pm. Case manager provided this information verbally to patient and entered it on AVS.   Expected Discharge Date:    06/15/17              Expected Discharge Plan:   Home/self care  In-House Referral:     Discharge planning Services  CM Consult, Follow-up appt scheduled, [facility]  Post Acute Care Choice:  NA Choice offered to:  Patient  DME Arranged:  N/A DME Agency:  NA  HH Arranged:    HH Agency:  NA  Status of Service:  Completed, signed off  If discussed at Long Length of Stay Meetings, dates discussed:    Additional Comments:  Durenda GuthrieBrady, Marayah Higdon Naomi, RN 06/15/2017, 10:37 AM

## 2017-06-15 NOTE — Progress Notes (Signed)
Patient discharged to home, AVS reviewed, prescriptions provided, IV removed, telebox returned. Smoking cessation encouraged and education completed

## 2017-06-16 LAB — BPAM RBC
Blood Product Expiration Date: 201901312359
Blood Product Expiration Date: 201902012359
Blood Product Expiration Date: 201902012359
ISSUE DATE / TIME: 201812301146
Unit Type and Rh: 6200
Unit Type and Rh: 6200
Unit Type and Rh: 6200

## 2017-06-16 LAB — TYPE AND SCREEN
ABO/RH(D): A POS
Antibody Screen: POSITIVE
UNIT DIVISION: 0
UNIT DIVISION: 0
Unit division: 0

## 2017-06-17 LAB — SICKLE CELL SCREEN: SICKLE CELL SCREEN: POSITIVE — AB

## 2017-06-20 DIAGNOSIS — H6091 Unspecified otitis externa, right ear: Secondary | ICD-10-CM | POA: Insufficient documentation

## 2017-06-20 DIAGNOSIS — J45909 Unspecified asthma, uncomplicated: Secondary | ICD-10-CM | POA: Insufficient documentation

## 2017-06-20 DIAGNOSIS — F17210 Nicotine dependence, cigarettes, uncomplicated: Secondary | ICD-10-CM | POA: Insufficient documentation

## 2017-06-20 DIAGNOSIS — Z79899 Other long term (current) drug therapy: Secondary | ICD-10-CM | POA: Insufficient documentation

## 2017-06-21 ENCOUNTER — Other Ambulatory Visit: Payer: Self-pay

## 2017-06-21 ENCOUNTER — Encounter (HOSPITAL_BASED_OUTPATIENT_CLINIC_OR_DEPARTMENT_OTHER): Payer: Self-pay | Admitting: *Deleted

## 2017-06-21 ENCOUNTER — Emergency Department (HOSPITAL_BASED_OUTPATIENT_CLINIC_OR_DEPARTMENT_OTHER)
Admission: EM | Admit: 2017-06-21 | Discharge: 2017-06-21 | Disposition: A | Discharge status: Home / Self Care | Payer: Self-pay | Attending: Physician Assistant | Admitting: Physician Assistant

## 2017-06-21 DIAGNOSIS — H6091 Unspecified otitis externa, right ear: Secondary | ICD-10-CM

## 2017-06-21 MED ORDER — IBUPROFEN 400 MG PO TABS
600.0000 mg | ORAL_TABLET | Freq: Once | ORAL | Status: AC
  Administered 2017-06-21: 600 mg via ORAL
  Filled 2017-06-21: qty 1

## 2017-06-21 MED ORDER — AMOXICILLIN-POT CLAVULANATE 875-125 MG PO TABS: 1.0000 | ORAL_TABLET | Freq: Two times a day (BID) | ORAL | 0 refills | Status: DC

## 2017-06-21 MED ORDER — CIPROFLOXACIN-DEXAMETHASONE 0.3-0.1 % OT SUSP
4.0000 [drp] | Freq: Two times a day (BID) | OTIC | Status: DC
  Administered 2017-06-21: 4 [drp] via OTIC
  Filled 2017-06-21: qty 7.5

## 2017-06-21 MED ORDER — AMOXICILLIN-POT CLAVULANATE 875-125 MG PO TABS
1.0000 | ORAL_TABLET | Freq: Once | ORAL | Status: AC
  Administered 2017-06-21: 1 via ORAL
  Filled 2017-06-21: qty 1

## 2017-06-21 MED ORDER — ACETAMINOPHEN 325 MG PO TABS
650.0000 mg | ORAL_TABLET | Freq: Once | ORAL | Status: AC
  Administered 2017-06-21: 650 mg via ORAL
  Filled 2017-06-21: qty 2

## 2017-06-21 NOTE — ED Notes (Signed)
Alert, NAD, calm, interactive, resps e/u, speaking in clear complete sentences, no dyspnea noted, skin W&D, initial VSS, c/o R ear ear pain for ~ 3d, (denies: fever, NVD, hearing changes, drainage,  sob, or dizziness or visual changes). No meds PTA. [facility]

## 2017-06-21 NOTE — ED Notes (Signed)
EDP into room, prior to RN assessment, see MD notes, pending orders.   

## 2017-06-21 NOTE — Discharge Instructions (Signed)
Please use the antibiotics provided.  Please use the drops 2 times daily for the next several days.

## 2017-06-21 NOTE — ED Triage Notes (Signed)
Right ear pain x 3 days. Cough

## 2017-06-21 NOTE — ED Provider Notes (Signed)
[facility] EMERGENCY DEPARTMENT Provider Note   CSN: [id number] Arrival date & time: 06/20/17  2337     History   Chief Complaint Chief Complaint  Patient presents with  . Otalgia    HPI Jenny Evans is a 47 y.o. female.  HPI   Patient is a 47 year old female presenting with right ear pain.  Patient reports lymph node swelling as well as right ear pain and swelling.  Started 2 days ago.  No fevers.  No other complaints.  Past Medical History:  Diagnosis Date  . Anemia   . Asthma   . Blood transfusion without reported diagnosis   . Chronic pain   . GERD (gastroesophageal reflux disease)   . Migraine   . Sickle cell trait (HCC)   . Ulcer     Patient Active Problem List   Diagnosis Date Noted  . Sickle cell trait (HCC) 06/14/2017  . Symptomatic anemia 06/14/2017  . Blood loss anemia 11/12/2015  . Melena 11/12/2015  . Nonspecific abnormal electrocardiogram (ECG) (EKG) 04/03/2015  . Palpitations 04/03/2015  . Neck pain 04/03/2015  . Colitis 07/10/2014  . Absolute anemia   . Post-operative state 08/26/2013  . Rectocele 08/26/2013  . Anal or rectal pain 07/29/2013  . Headache(784.0) 02/27/2012  . ACUTE BRONCHITIS 03/25/2010  . OBESITY 09/06/2009  . INSOMNIA 01/23/2009  . HYPOKALEMIA 11/01/2008  . DYSPEPSIA&OTHER [facility] DISORDERS FUNCTION STOMACH 06/07/2008  . ALLERGIC RHINITIS 03/23/2008  . CONSTIPATION 01/20/2008  . Shortness of breath 08/27/2007  . TOBACCO ABUSE 03/09/2007  . Migraine 03/09/2007  . ABSCESS, TOOTH 03/09/2007  . GERD (gastroesophageal reflux disease) 03/09/2007  . Migraine variant 02/25/2007  . ECZEMA 02/25/2007    Past Surgical History:  Procedure Laterality Date  . ESOPHAGOGASTRODUODENOSCOPY (EGD) WITH PROPOFOL N/A 04/04/2013   Procedure: ESOPHAGOGASTRODUODENOSCOPY (EGD) WITH PROPOFOL;  Surgeon: Willis ModenaWilliam Outlaw, MD;  Location: [facility];  Service: Endoscopy;  Laterality: N/A;  . EXAMINATION UNDER ANESTHESIA N/A 08/10/2013     Procedure: RECTAL EXAM UNDER ANESTHESIA;  Surgeon: Clovis Puhomas A. Cornett, MD;  Location: [facility];  Service: General;  Laterality: N/A;  ligation  . HEMORRHOID SURGERY    . MYRINGOTOMY     age 47    OB History    No data available       Home Medications    Prior to Admission medications   Medication Sig Start Date End Date Taking? Authorizing Provider  benzonatate (TESSALON) 100 MG capsule Take 1 capsule (100 mg total) by mouth 3 (three) times daily as needed for cough. 06/15/17   Joseph ArtVann, Jessica U, DO  cetirizine (ZYRTEC ALLERGY) 10 MG tablet Take 1 tablet (10 mg total) by mouth daily. Patient not taking: Reported on 06/14/2017 03/27/17   Muthersbaugh, Dahlia ClientHannah, PA-C  docusate sodium (COLACE) 250 MG capsule Take 1 capsule (250 mg total) by mouth daily. Patient not taking: Reported on 02/23/2017 12/24/16   Lorre NickAllen, Anthony, MD  esomeprazole (NEXIUM) 20 MG capsule Take 2-3 capsules (40-60 mg total) by mouth daily at 12 noon. 06/15/17   Joseph ArtVann, Jessica U, DO  ferrous sulfate 325 (65 FE) MG tablet Take 1 tablet (325 mg total) by mouth daily. Patient not taking: Reported on 12/24/2016 11/04/16   Little, Ambrose Finlandachel Morgan, MD  fluticasone [facility](FLONASE) 50 MCG/ACT nasal spray Place 2 sprays into both nostrils daily. Patient not taking: Reported on 06/14/2017 03/27/17   Muthersbaugh, Dahlia ClientHannah, PA-C  folic acid (FOLVITE) 1 MG tablet Take 1 tablet (1 mg total) by mouth daily. 06/16/17  Joseph Art, DO  nicotine (NICODERM CQ - DOSED IN MG/24 HOURS) 14 mg/24hr patch Place 1 patch (14 mg total) onto the skin daily. 06/16/17   Joseph Art, DO  vitamin C (VITAMIN C) 500 MG tablet Take 1 tablet (500 mg total) by mouth daily. 06/16/17   Joseph Art, DO    Family History Family History  Problem Relation Age of Onset  . Heart disease Mother        Arrythmia  . Kidney disease Mother   . Hypertension Mother   . COPD Mother   . Diabetes Mother   . Heart disease Father   . Crohn's disease  Maternal Aunt     Social History Social History   Tobacco Use  . Smoking status: Current Some Day Smoker    Packs/day: 0.25    Years: 15.00    Pack years: 3.75    Types: Cigarettes  . Smokeless tobacco: Never Used  Substance Use Topics  . Alcohol use: No  . Drug use: No     Allergies   Hydrocodone-acetaminophen   Review of Systems Review of Systems  Constitutional: Negative for fatigue and fever.  HENT: Positive for congestion and ear pain.      Physical Exam Updated Vital Signs BP 106/77 (BP Location: Left Arm)   Pulse (!) 52   Temp 98.3 F (36.8 C) (Oral)   Resp 16   LMP 06/05/2017   SpO2 99%   Physical Exam  Constitutional: She is oriented to person, place, and time. She appears well-developed and well-nourished.  HENT:  Head: Normocephalic and atraumatic.  Mouth/Throat: No oropharyngeal exudate.  Right ear with swelling to the canal, difficult to visualize TM behind that.  Left TM normal.   Cervical adenopathy  Eyes: EOM are normal. Pupils are equal, round, and reactive to light. Right eye exhibits no discharge. Left eye exhibits no discharge.  Cardiovascular: Normal rate.  Pulmonary/Chest: Effort normal.  Neurological: She is oriented to person, place, and time.  Skin: Skin is warm and dry. She is not diaphoretic.  Psychiatric: She has a normal mood and affect.  Nursing note and vitals reviewed.    ED Treatments / Results  Labs (all labs ordered are listed, but only abnormal results are displayed) Labs Reviewed - No data to display  EKG  EKG Interpretation None       Radiology No results found.  Procedures Procedures (including critical care time)  Medications Ordered in ED Medications  amoxicillin-clavulanate (AUGMENTIN) 875-125 MG per tablet 1 tablet (not administered)  ciprofloxacin-dexamethasone (CIPRODEX) 0.3-0.1 % OTIC (EAR) suspension 4 drop (not administered)     Initial Impression / Assessment and Plan / ED Course  I  have reviewed the triage vital signs and the nursing notes.  Pertinent labs & imaging results that were available during my care of the patient were reviewed by me and considered in my medical decision making (see chart for details).    Patient is a 47 year old female presenting with right ear pain.  Patient reports lymph node swelling as well as right ear pain and swelling.  Started 2 days ago.  No fevers.  No other complaints.  3:16 AM Will treat for otitis externa and with drops as well as antibiotics for potential otitis media given lack of exam because of swelling.  I do not think patient requires wick right now because able to get antibiotics into the ear  Final Clinical Impressions(s) / ED Diagnoses   Final diagnoses:  None    ED Discharge Orders    None       Abelino Derrick, MD 06/21/17 [phone]

## 2017-06-25 ENCOUNTER — Inpatient Hospital Stay (INDEPENDENT_AMBULATORY_CARE_PROVIDER_SITE_OTHER): Payer: Self-pay | Admitting: Physician Assistant

## 2017-07-01 ENCOUNTER — Ambulatory Visit (INDEPENDENT_AMBULATORY_CARE_PROVIDER_SITE_OTHER): Payer: Self-pay | Admitting: Physician Assistant

## 2017-07-01 ENCOUNTER — Other Ambulatory Visit: Payer: Self-pay

## 2017-07-01 ENCOUNTER — Encounter (INDEPENDENT_AMBULATORY_CARE_PROVIDER_SITE_OTHER): Payer: Self-pay | Admitting: Physician Assistant

## 2017-07-01 VITALS — BP 113/80 | HR 59 | Temp 97.9°F | Wt 124.0 lb

## 2017-07-01 DIAGNOSIS — J320 Chronic maxillary sinusitis: Secondary | ICD-10-CM

## 2017-07-01 DIAGNOSIS — D649 Anemia, unspecified: Secondary | ICD-10-CM

## 2017-07-01 DIAGNOSIS — R7989 Other specified abnormal findings of blood chemistry: Secondary | ICD-10-CM

## 2017-07-01 MED ORDER — GUAIFENESIN ER 1200 MG PO TB12: 1.0000 | ORAL_TABLET | Freq: Two times a day (BID) | ORAL | 0 refills | Status: AC

## 2017-07-01 MED ORDER — SENNOSIDES-DOCUSATE SODIUM 8.6-50 MG PO TABS: 2.0000 | ORAL_TABLET | Freq: Every day | ORAL | 0 refills | Status: DC

## 2017-07-01 MED ORDER — FERROUS SULFATE 325 (65 FE) MG PO TBEC: 325.0000 mg | DELAYED_RELEASE_TABLET | Freq: Three times a day (TID) | ORAL | 1 refills | Status: DC

## 2017-07-01 MED ORDER — DOXYCYCLINE HYCLATE 100 MG PO TABS: 100.0000 mg | ORAL_TABLET | Freq: Two times a day (BID) | ORAL | 0 refills | Status: DC

## 2017-07-01 MED ORDER — PHENYLEPHRINE HCL 1 % NA SOLN: 1.0000 [drp] | Freq: Four times a day (QID) | NASAL | 0 refills | Status: DC | PRN

## 2017-07-01 NOTE — Progress Notes (Signed)
Subjective:  Patient ID: Jenny Evans, female    DOB: Jan 12, 1971  Age: 47 y.o. MRN: [id number]  CC: fatigue  HPI Juanette Z Forge is a 47 y.o. female with a medical history of anemia, ashtma, GERD, migraine, tobacco use, and sickle cell trait presents with mild fatigue attributed to a cold. Onset of cold three days ago. Symptoms include nasal congestion, cough, and sinus pressure. Does not endorse f/c/n/v, ear pain, eye pain, CP, dyspnea, abdominal pain, rash, or GI/GU sxs.     Was in hospital two weeks ago and received blood transfusion for anemia. Hgb 7.2 g/dL at admission and Hgb 8.6 g/dL at discharge. Pt currently feels mildly fatigued.    Outpatient Medications Prior to Visit  Medication Sig Dispense Refill  . amoxicillin-clavulanate (AUGMENTIN) 875-125 MG tablet Take 1 tablet by mouth every 12 (twelve) hours. (Patient not taking: Reported on 07/01/2017) 14 tablet 0  . benzonatate (TESSALON) 100 MG capsule Take 1 capsule (100 mg total) by mouth 3 (three) times daily as needed for cough. (Patient not taking: Reported on 07/01/2017) 20 capsule 0  . cetirizine (ZYRTEC ALLERGY) 10 MG tablet Take 1 tablet (10 mg total) by mouth daily. (Patient not taking: Reported on 06/14/2017) 30 tablet 1  . docusate sodium (COLACE) 250 MG capsule Take 1 capsule (250 mg total) by mouth daily. (Patient not taking: Reported on 02/23/2017) 10 capsule 0  . esomeprazole (NEXIUM) 20 MG capsule Take 2-3 capsules (40-60 mg total) by mouth daily at 12 noon. (Patient not taking: Reported on 07/01/2017) 30 capsule 0  . ferrous sulfate 325 (65 FE) MG tablet Take 1 tablet (325 mg total) by mouth daily. (Patient not taking: Reported on 12/24/2016) 30 tablet 0  . fluticasone (FLONASE) 50 MCG/ACT nasal spray Place 2 sprays into both nostrils daily. (Patient not taking: Reported on 06/14/2017) 9.9 g 2  . folic acid (FOLVITE) 1 MG tablet Take 1 tablet (1 mg total) by mouth daily. (Patient not taking: Reported on 07/01/2017) 30  tablet 0  . nicotine (NICODERM CQ - DOSED IN MG/24 HOURS) 14 mg/24hr patch Place 1 patch (14 mg total) onto the skin daily. (Patient not taking: Reported on 07/01/2017) 28 patch 0  . vitamin C (VITAMIN C) 500 MG tablet Take 1 tablet (500 mg total) by mouth daily. (Patient not taking: Reported on 07/01/2017)     No facility-administered medications prior to visit.      ROS Review of Systems  Constitutional: Positive for malaise/fatigue. Negative for chills and fever.  HENT: Positive for congestion and sinus pain. Negative for ear pain and sore throat.   Eyes: Negative for blurred vision and pain.  Respiratory: Negative for shortness of breath.   Cardiovascular: Negative for chest pain and palpitations.  Gastrointestinal: Negative for abdominal pain and nausea.  Genitourinary: Negative for dysuria and hematuria.  Musculoskeletal: Negative for joint pain and myalgias.  Skin: Negative for rash.  Neurological: Negative for tingling and headaches.  Psychiatric/Behavioral: Negative for depression. The patient is not nervous/anxious.     Objective:  BP 113/80 (BP Location: Left Arm, Patient Position: Sitting, Cuff Size: Normal)   Pulse (!) 59   Temp 97.9 F (36.6 C) (Oral)   Wt 124 lb (56.2 kg)   LMP 06/05/2017 (Exact Date)   SpO2 96%   BMI 21.28 kg/m   BP/Weight 07/01/2017 06/21/2017 06/15/2017  Systolic BP 113 106 100  Diastolic BP 80 77 66  Wt. (Lbs) 124 - -  BMI 21.28 - -  Some  encounter information is confidential and restricted. Go to Review Flowsheets activity to see all data.      Physical Exam  Constitutional: She is oriented to person, place, and time.  Well developed, well nourished, NAD, polite  HENT:  Head: Normocephalic and atraumatic.  Eyes: No scleral icterus.  Mild exophthalmos  Neck: Normal range of motion. Neck supple. No thyromegaly present.  Cardiovascular: Normal rate, regular rhythm and normal heart sounds.  Pulmonary/Chest: Effort normal and breath  sounds normal.  Musculoskeletal: She exhibits no edema.  Neurological: She is alert and oriented to person, place, and time. No cranial nerve deficit. Coordination normal.  Skin: Skin is warm and dry. No rash noted. No erythema. No pallor.  Dry, cracked lips  Psychiatric: She has a normal mood and affect. Her behavior is normal. Thought content normal.  Vitals reviewed.    Assessment & Plan:    1. Chronic maxillary sinusitis - doxycycline (VIBRA-TABS) 100 MG tablet; Take 1 tablet (100 mg total) by mouth 2 (two) times daily.  Dispense: 20 tablet; Refill: 0 - Guaifenesin (MUCINEX MAXIMUM STRENGTH) 1200 MG TB12; Take 1 tablet (1,200 mg total) by mouth 2 (two) times daily for 5 days.  Dispense: 10 tablet; Refill: 0 - phenylephrine (NEO-SYNEPHRINE) 1 % nasal spray; Place 1 drop into both nostrils every 6 (six) hours as needed for congestion.  Dispense: 30 mL; Refill: 0  2. Anemia, unspecified type - CBC with Differential - Vitamin B12 - Comprehensive metabolic panel - ferrous sulfate 325 (65 FE) MG EC tablet; Take 1 tablet (325 mg total) by mouth 3 (three) times daily with meals.  Dispense: 90 tablet; Refill: 1 - senna-docusate (SENOKOT-S) 8.6-50 MG tablet; Take 2 tablets by mouth daily.  Dispense: 30 tablet; Refill: 0  3. Abnormal TSH - Thyroid Panel With TSH   Meds ordered this encounter  Medications  . ferrous sulfate 325 (65 FE) MG EC tablet    Sig: Take 1 tablet (325 mg total) by mouth 3 (three) times daily with meals.    Dispense:  90 tablet    Refill:  1    Order Specific Question:   Supervising Provider    Answer:   Quentin AngstJEGEDE, OLUGBEMIGA E [id number]]  . senna-docusate (SENOKOT-S) 8.6-50 MG tablet    Sig: Take 2 tablets by mouth daily.    Dispense:  30 tablet    Refill:  0    Order Specific Question:   Supervising Provider    Answer:   Quentin AngstJEGEDE, OLUGBEMIGA E [id number]]  . doxycycline (VIBRA-TABS) 100 MG tablet    Sig: Take 1 tablet (100 mg total) by mouth 2 (two) times daily.     Dispense:  20 tablet    Refill:  0    Order Specific Question:   Supervising Provider    Answer:   Quentin AngstJEGEDE, OLUGBEMIGA E [id number]]  . Guaifenesin (MUCINEX MAXIMUM STRENGTH) 1200 MG TB12    Sig: Take 1 tablet (1,200 mg total) by mouth 2 (two) times daily for 5 days.    Dispense:  10 tablet    Refill:  0    Order Specific Question:   Supervising Provider    Answer:   Quentin AngstJEGEDE, OLUGBEMIGA E [id number]  . phenylephrine (NEO-SYNEPHRINE) 1 % nasal spray    Sig: Place 1 drop into both nostrils every 6 (six) hours as needed for congestion.    Dispense:  30 mL    Refill:  0    Order Specific Question:   Supervising Provider    Answer:  Quentin Angst [[id number]]    Follow-up: Return in about 6 weeks (around 08/12/2017) for PAP.   Loletta Specter PA

## 2017-07-01 NOTE — Patient Instructions (Signed)

## 2017-07-03 ENCOUNTER — Telehealth (INDEPENDENT_AMBULATORY_CARE_PROVIDER_SITE_OTHER): Payer: Self-pay

## 2017-07-03 ENCOUNTER — Other Ambulatory Visit (INDEPENDENT_AMBULATORY_CARE_PROVIDER_SITE_OTHER): Payer: Self-pay | Admitting: Physician Assistant

## 2017-07-03 DIAGNOSIS — E876 Hypokalemia: Secondary | ICD-10-CM

## 2017-07-03 LAB — THYROID PANEL WITH TSH
FREE THYROXINE INDEX: 1.8 (ref 1.2–4.9)
T3 UPTAKE RATIO: 26 % (ref 24–39)
T4, Total: 6.9 ug/dL (ref 4.5–12.0)
TSH: 0.522 u[IU]/mL (ref 0.450–4.500)

## 2017-07-03 LAB — CBC WITH DIFFERENTIAL/PLATELET
Basophils Absolute: 0 10*3/uL (ref 0.0–0.2)
Basos: 1 %
EOS (ABSOLUTE): 0.1 10*3/uL (ref 0.0–0.4)
EOS: 3 %
HEMATOCRIT: 29.3 % — AB (ref 34.0–46.6)
HEMOGLOBIN: 9.5 g/dL — AB (ref 11.1–15.9)
Immature Grans (Abs): 0 10*3/uL (ref 0.0–0.1)
Immature Granulocytes: 0 %
LYMPHS ABS: 1.5 10*3/uL (ref 0.7–3.1)
Lymphs: 37 %
MCH: 24.7 pg — ABNORMAL LOW (ref 26.6–33.0)
MCHC: 32.4 g/dL (ref 31.5–35.7)
MCV: 76 fL — AB (ref 79–97)
MONOS ABS: 0.6 10*3/uL (ref 0.1–0.9)
Monocytes: 13 %
Neutrophils Absolute: 1.9 10*3/uL (ref 1.4–7.0)
Neutrophils: 46 %
Platelets: 525 10*3/uL — ABNORMAL HIGH (ref 150–379)
RBC: 3.84 x10E6/uL (ref 3.77–5.28)
RDW: 26.8 % — AB (ref 12.3–15.4)
WBC: 4.1 10*3/uL (ref 3.4–10.8)

## 2017-07-03 LAB — COMPREHENSIVE METABOLIC PANEL
ALK PHOS: 58 IU/L (ref 39–117)
ALT: 11 IU/L (ref 0–32)
AST: 18 IU/L (ref 0–40)
Albumin/Globulin Ratio: 2 (ref 1.2–2.2)
Albumin: 4.2 g/dL (ref 3.5–5.5)
BUN/Creatinine Ratio: 13 (ref 9–23)
BUN: 7 mg/dL (ref 6–24)
Bilirubin Total: 0.2 mg/dL (ref 0.0–1.2)
CHLORIDE: 104 mmol/L (ref 96–106)
CO2: 21 mmol/L (ref 20–29)
CREATININE: 0.56 mg/dL — AB (ref 0.57–1.00)
Calcium: 9.3 mg/dL (ref 8.7–10.2)
GFR calc Af Amer: 131 mL/min/{1.73_m2} (ref >59)
GFR calc non Af Amer: 114 mL/min/{1.73_m2} (ref >59)
GLUCOSE: 91 mg/dL (ref 65–99)
Globulin, Total: 2.1 g/dL (ref 1.5–4.5)
Potassium: 3.2 mmol/L — ABNORMAL LOW (ref 3.5–5.2)
Sodium: 142 mmol/L (ref 134–144)
Total Protein: 6.3 g/dL (ref 6.0–8.5)

## 2017-07-03 LAB — VITAMIN B12: VITAMIN B 12: 707 pg/mL (ref 232–1245)

## 2017-07-03 MED ORDER — POTASSIUM CHLORIDE CRYS ER 20 MEQ PO TBCR: 20.0000 meq | EXTENDED_RELEASE_TABLET | Freq: Every day | ORAL | 0 refills | Status: DC

## 2017-07-03 NOTE — Telephone Encounter (Signed)
Left patient the following message, anemia improving but continue taking iron pills. Vitamin B12 is normal, potassium a little low, potassium pills send to [facility] on [address]. Call office with any questions. Maryjean Mornempestt S Roberts, CMA

## 2017-07-03 NOTE — Telephone Encounter (Signed)
-----   Message from Loletta Specteroger David Gomez, PA-C sent at 07/03/2017  8:27 AM EST ----- Anemia is improving. Vit B12 normal. Potassium is a little low. I will send potassium pills to [facility] on [address]. Continue iron pills.

## 2017-07-16 ENCOUNTER — Other Ambulatory Visit (INDEPENDENT_AMBULATORY_CARE_PROVIDER_SITE_OTHER): Payer: Self-pay | Admitting: Physician Assistant

## 2017-07-16 ENCOUNTER — Encounter (INDEPENDENT_AMBULATORY_CARE_PROVIDER_SITE_OTHER): Payer: Self-pay | Admitting: Physician Assistant

## 2017-07-16 ENCOUNTER — Ambulatory Visit (INDEPENDENT_AMBULATORY_CARE_PROVIDER_SITE_OTHER): Payer: Self-pay | Admitting: Physician Assistant

## 2017-07-16 VITALS — BP 115/81 | HR 98 | Temp 98.2°F | Resp 18 | Ht 64.0 in | Wt 128.0 lb

## 2017-07-16 DIAGNOSIS — N644 Mastodynia: Secondary | ICD-10-CM

## 2017-07-16 DIAGNOSIS — N63 Unspecified lump in unspecified breast: Secondary | ICD-10-CM

## 2017-07-16 MED ORDER — NAPROXEN 500 MG PO TABS: 500.0000 mg | ORAL_TABLET | Freq: Two times a day (BID) | ORAL | 0 refills | Status: DC

## 2017-07-16 NOTE — Patient Instructions (Signed)

## 2017-07-16 NOTE — Progress Notes (Signed)
Subjective:  Patient ID: Jenny Evans, female    DOB: [birth year]  Age: 47 y.o. MRN: [id number]   CC:  Lump on breast    HPI  Jenny Evans is a 47 y.o. female with a medical history of anemia, ashtma, GERD, migraine, tobacco use, and sickle cell trait presents with concern of lump in the left breast. Says the lump is somewhat superficial, painful, and throbs at times. No nipple discharge or association with menses. She is concerned with cancer as her sister had cancer at an early age. She is a smoker, no contraceptive use, and no hx of coagulopathies. Does not endorse any other symptoms or complaints.   Outpatient Medications Prior to Visit  Medication Sig Dispense Refill  . doxycycline (VIBRA-TABS) 100 MG tablet Take 1 tablet (100 mg total) by mouth 2 (two) times daily. 20 tablet 0  . ferrous sulfate 325 (65 FE) MG EC tablet Take 1 tablet (325 mg total) by mouth 3 (three) times daily with meals. 90 tablet 1  . phenylephrine (NEO-SYNEPHRINE) 1 % nasal spray Place 1 drop into both nostrils every 6 (six) hours as needed for congestion. 30 mL 0  . potassium chloride SA (K-DUR,KLOR-CON) 20 MEQ tablet Take 1 tablet (20 mEq total) by mouth daily. 10 tablet 0  . senna-docusate (SENOKOT-S) 8.6-50 MG tablet Take 2 tablets by mouth daily. 30 tablet 0   No facility-administered medications prior to visit.      ROS Review of Systems  Constitutional: Negative for chills, fever and malaise/fatigue.  Eyes: Negative for blurred vision.  Respiratory: Negative for shortness of breath.   Cardiovascular: Negative for chest pain and palpitations.  Gastrointestinal: Negative for abdominal pain and nausea.  Genitourinary: Negative for dysuria and hematuria.  Musculoskeletal: Negative for joint pain and myalgias.  Skin: Negative for rash.  Neurological: Negative for tingling and headaches.  Psychiatric/Behavioral: Negative for depression. The patient is not nervous/anxious.     Objective:   There were no vitals taken for this visit.  BP/Weight 07/01/2017 06/21/2017 06/15/2017  Systolic BP 113 106 100  Diastolic BP 80 77 66  Wt. (Lbs) 124 - -  BMI 21.28 - -  Some encounter information is confidential and restricted. Go to Review Flowsheets activity to see all data.      Physical Exam  Constitutional: She is oriented to person, place, and time.  Well developed, well nourished, NAD, polite  HENT:  Head: Normocephalic and atraumatic.  Eyes: No scleral icterus.  Neck: Normal range of motion. Neck supple. No thyromegaly present.  Pulmonary/Chest: Effort normal.  Musculoskeletal: She exhibits no edema.  Neurological: She is alert and oriented to person, place, and time.  Skin: Skin is warm and dry. No rash noted. No erythema. No pallor.  Left breast with somewhat superficial nodule that is freely movable, mildly tender, and with mild overlying erythema. Nodule is approximately 8-10 mm. Located at the 5 o'clock position in the areola  Psychiatric: She has a normal mood and affect. Her behavior is normal. Thought content normal.  Vitals reviewed.    Assessment & Plan:   1. Breast lump in female - Suspected cyst - US BREAST COMPLETE UNI LEFT INC AXILLA; Future - Begin naproxen (NAPROSYN) 500 MG tablet; Take 1 tablet (500 mg total) by mouth 2 (two) times daily with a meal.  Dispense: 30 tablet; Refill: 0  2. Mastalgia - US BREAST COMPLETE UNI LEFT INC AXILLA; Future - Begin naproxen (NAPROSYN) 500 MG tablet; Take 1 tablet (  500 mg total) by mouth 2 (two) times daily with a meal.  Dispense: 30 tablet; Refill: 0   Meds ordered this encounter  Medications  . naproxen (NAPROSYN) 500 MG tablet    Sig: Take 1 tablet (500 mg total) by mouth 2 (two) times daily with a meal.    Dispense:  30 tablet    Refill:  0    Order Specific Question:   Supervising Provider    Answer:   Quentin AngstJEGEDE, OLUGBEMIGA E [[id number]]    Follow-up: Return in about 4 weeks (around 08/13/2017) for breast  lump.   Loletta Specteroger David Gomez PA

## 2017-07-19 ENCOUNTER — Emergency Department (HOSPITAL_COMMUNITY)
Admission: EM | Admit: 2017-07-19 | Discharge: 2017-07-19 | Disposition: A | Discharge status: Home / Self Care | Payer: Self-pay | Attending: Emergency Medicine | Admitting: Emergency Medicine

## 2017-07-19 ENCOUNTER — Other Ambulatory Visit: Payer: Self-pay

## 2017-07-19 ENCOUNTER — Encounter (HOSPITAL_COMMUNITY): Payer: Self-pay | Admitting: Emergency Medicine

## 2017-07-19 DIAGNOSIS — L0291 Cutaneous abscess, unspecified: Secondary | ICD-10-CM

## 2017-07-19 DIAGNOSIS — L02413 Cutaneous abscess of right upper limb: Secondary | ICD-10-CM | POA: Insufficient documentation

## 2017-07-19 DIAGNOSIS — J45909 Unspecified asthma, uncomplicated: Secondary | ICD-10-CM | POA: Insufficient documentation

## 2017-07-19 DIAGNOSIS — Z79899 Other long term (current) drug therapy: Secondary | ICD-10-CM | POA: Insufficient documentation

## 2017-07-19 DIAGNOSIS — Z23 Encounter for immunization: Secondary | ICD-10-CM | POA: Insufficient documentation

## 2017-07-19 DIAGNOSIS — F17210 Nicotine dependence, cigarettes, uncomplicated: Secondary | ICD-10-CM | POA: Insufficient documentation

## 2017-07-19 MED ORDER — TETANUS-DIPHTH-ACELL PERTUSSIS 5-2.5-18.5 LF-MCG/0.5 IM SUSP
0.5000 mL | Freq: Once | INTRAMUSCULAR | Status: AC
Start: 2017-07-19 — End: 2017-07-19
  Administered 2017-07-19: 0.5 mL via INTRAMUSCULAR
  Filled 2017-07-19: qty 0.5

## 2017-07-19 MED ORDER — CEPHALEXIN 500 MG PO CAPS: 500.0000 mg | ORAL_CAPSULE | Freq: Four times a day (QID) | ORAL | 0 refills | Status: DC

## 2017-07-19 MED ORDER — LIDOCAINE-EPINEPHRINE (PF) 2 %-1:200000 IJ SOLN
10.0000 mL | Freq: Once | INTRAMUSCULAR | Status: AC
  Administered 2017-07-19: 10 mL
  Filled 2017-07-19: qty 20

## 2017-07-19 NOTE — ED Triage Notes (Signed)
Pt presents with painful lump on right forearm x 4-5 days. No drainage present.

## 2017-07-19 NOTE — Discharge Instructions (Signed)
You may remove the bandage after 24 hours.  The only reason to replace the bandage is to protect clothing from drainage. Bandages, if used, should be replaced daily or whenever soiled. The wound may continue to drain for the next 2-3 days.   Clean the wound and surrounding area gently with tap water and mild soap. Rinse well and blot dry. You may shower normally. Soaking the wound in Epsom salt baths for no more than 15 minutes once a day may help rinse out any remaining pus and help with wound healing.  Clean the wound daily to prevent further infection. Do not use cleaners such as hydrogen peroxide or alcohol.   Please take all of your antibiotics until finished!   You may develop abdominal discomfort or diarrhea from the antibiotic.  You may help offset this with probiotics which you can buy or get in yogurt. Do not eat or take the probiotics until 2 hours after your antibiotic.   Scar reduction: Application of a topical antibiotic ointment, such as Neosporin, after the wound has begun to close and heal well can decrease scab formation and reduce scarring. After the wound has healed, application of ointments such as Aquaphor can also reduce scar formation.  The key to scar reduction is keeping the skin well hydrated and supple. Drinking plenty of water throughout the day (At least eight 8oz glasses of water a day) is essential to staying well hydrated.  Sun exposure: Keep the wound out of the sun. After the wound has healed, continue to protect it from the sun by wearing protective clothing or applying sunscreen.  Pain: You may use Tylenol, naproxen, or ibuprofen for pain.  Follow up: Please return to the ED or go to your primary care provider in 2-3 days for a wound check to assure proper healing.  Return to the ED sooner should signs of worsening infection arise, such as spreading redness, worsening puffiness/swelling, severe increase in pain, fever over 100.52F, or any other major issues.

## 2017-07-19 NOTE — ED Provider Notes (Signed)
[facility]-EMERGENCY DEPT Provider Note   CSN: [id number] Arrival date & time: 07/19/17  1422     History   Chief Complaint Chief Complaint  Patient presents with  . Abscess    HPI Jenny Evans is a 47 y.o. female.  HPI   Jenny Evans is a 47 y.o. female, with a history of anemia, asthma, GERD, and sickle cell trait, presenting to the ED with abscess on the right forearm noted a few days ago. Area began draining two days ago and the swelling improved. Complains of persistent pain. Has not tried any therapies for her issue.  Patient has had one previous instance of abscess on her buttocks, but this spontaneously ruptured. Denies history of IV drug use, HIV, or recent tattoos or procedures. Denies fever/chills, nausea/vomiting, numbness, weakness, or any other complaints.  Tetanus status unknown.     Past Medical History:  Diagnosis Date  . Anemia   . Asthma   . Blood transfusion without reported diagnosis   . Chronic pain   . GERD (gastroesophageal reflux disease)   . Migraine   . Sickle cell trait (HCC)   . Ulcer     Patient Active Problem List   Diagnosis Date Noted  . Sickle cell trait (HCC) 06/14/2017  . Symptomatic anemia 06/14/2017  . Blood loss anemia 11/12/2015  . Melena 11/12/2015  . Nonspecific abnormal electrocardiogram (ECG) (EKG) 04/03/2015  . Palpitations 04/03/2015  . Neck pain 04/03/2015  . Colitis 07/10/2014  . Absolute anemia   . Post-operative state 08/26/2013  . Rectocele 08/26/2013  . Anal or rectal pain 07/29/2013  . Headache(784.0) 02/27/2012  . ACUTE BRONCHITIS 03/25/2010  . OBESITY 09/06/2009  . INSOMNIA 01/23/2009  . HYPOKALEMIA 11/01/2008  . DYSPEPSIA&OTHER [facility] DISORDERS FUNCTION STOMACH 06/07/2008  . ALLERGIC RHINITIS 03/23/2008  . CONSTIPATION 01/20/2008  . Shortness of breath 08/27/2007  . TOBACCO ABUSE 03/09/2007  . Migraine 03/09/2007  . ABSCESS, TOOTH 03/09/2007  . GERD (gastroesophageal  reflux disease) 03/09/2007  . Migraine variant 02/25/2007  . ECZEMA 02/25/2007    Past Surgical History:  Procedure Laterality Date  . ESOPHAGOGASTRODUODENOSCOPY (EGD) WITH PROPOFOL N/A 04/04/2013   Procedure: ESOPHAGOGASTRODUODENOSCOPY (EGD) WITH PROPOFOL;  Surgeon: Willis Modena, MD;  Location: [facility];  Service: Endoscopy;  Laterality: N/A;  . EXAMINATION UNDER ANESTHESIA N/A 08/10/2013   Procedure: RECTAL EXAM UNDER ANESTHESIA;  Surgeon: Clovis Pu. Cornett, MD;  Location: [facility];  Service: General;  Laterality: N/A;  ligation  . HEMORRHOID SURGERY    . MYRINGOTOMY     age 54    OB History    No data available       Home Medications    Prior to Admission medications   Medication Sig Start Date End Date Taking? Authorizing Provider  cephALEXin (KEFLEX) 500 MG capsule Take 1 capsule (500 mg total) by mouth 4 (four) times daily. 07/19/17   Domnique Vantine C, PA-C  ferrous sulfate 325 (65 FE) MG EC tablet Take 1 tablet (325 mg total) by mouth 3 (three) times daily with meals. 07/01/17   Loletta Specter, PA-C  naproxen (NAPROSYN) 500 MG tablet Take 1 tablet (500 mg total) by mouth 2 (two) times daily with a meal. 07/16/17   Loletta Specter, PA-C  phenylephrine (NEO-SYNEPHRINE) 1 % nasal spray Place 1 drop into both nostrils every 6 (six) hours as needed for congestion. 07/01/17   Loletta Specter, PA-C  potassium chloride SA (K-DUR,KLOR-CON) 20 MEQ tablet Take 1  tablet (20 mEq total) by mouth daily. 07/03/17   Loletta Specter, PA-C  senna-docusate (SENOKOT-S) 8.6-50 MG tablet Take 2 tablets by mouth daily. 07/01/17   Loletta Specter, PA-C    Family History Family History  Problem Relation Age of Onset  . Heart disease Mother        Arrythmia  . Kidney disease Mother   . Hypertension Mother   . COPD Mother   . Diabetes Mother   . Heart disease Father   . Crohn's disease Maternal Aunt     Social History Social History   Tobacco Use  . Smoking  status: Current Some Day Smoker    Packs/day: 0.25    Years: 15.00    Pack years: 3.75    Types: Cigarettes  . Smokeless tobacco: Never Used  Substance Use Topics  . Alcohol use: No  . Drug use: No     Allergies   Hydrocodone-acetaminophen   Review of Systems Review of Systems  Constitutional: Negative for fever.  Skin:       Abscess to the right forearm.  Neurological: Negative for weakness and numbness.     Physical Exam Updated Vital Signs BP 108/75   Pulse 94   Temp 98.4 F (36.9 C)   Resp 16   LMP 07/03/2017   SpO2 100%   Physical Exam  Constitutional: She appears well-developed and well-nourished. No distress.  HENT:  Head: Normocephalic and atraumatic.  Eyes: Conjunctivae are normal.  Neck: Neck supple.  Cardiovascular: Normal rate, regular rhythm and intact distal pulses.  Pulmonary/Chest: Effort normal.  Neurological: She is alert.  No sensory deficits noted. Abduction and adduction of the fingers intact against resistance. Grip strength equal bilaterally. Strength 5/5 through the cardinal directions of the bilateral wrists. Strength 5/5 with flexion and extension of the bilateral elbows. Patient can touch the thumb to each one of the fingertips without difficulty.   Skin: Skin is warm and dry. Capillary refill takes less than 2 seconds. She is not diaphoretic. No pallor.     Psychiatric: She has a normal mood and affect. Her behavior is normal.  Nursing note and vitals reviewed.    ED Treatments / Results  Labs (all labs ordered are listed, but only abnormal results are displayed) Labs Reviewed - No data to display  EKG  EKG Interpretation None       Radiology No results found.  Procedures .Marland KitchenIncision and Drainage Date/Time: 07/19/2017 3:29 PM Performed by: Anselm Pancoast, PA-C Authorized by: Anselm Pancoast, PA-C   Consent:    Consent obtained:  Verbal   Consent given by:  Patient   Risks discussed:  Bleeding, incomplete drainage  and pain Location:    Type:  Abscess   Size:  1.5cm   Location:  Upper extremity   Upper extremity location:  Arm   Arm location:  R lower arm Pre-procedure details:    Skin preparation:  Betadine Anesthesia (see MAR for exact dosages):    Anesthesia method:  Local infiltration   Local anesthetic:  Lidocaine 2% WITH epi Procedure type:    Complexity:  Simple Procedure details:    Incision types:  Single straight   Incision depth:  Subcutaneous   Scalpel blade:  11   Wound management:  Probed and deloculated and irrigated with saline   Drainage:  Bloody and purulent   Drainage amount:  Scant   Wound treatment:  Wound left open   Packing materials:  None Post-procedure details:  Patient tolerance of procedure:  Tolerated well, no immediate complications    (including critical care time)  EMERGENCY DEPARTMENT US SOFT TISSUE INTERPRETATION "Study: Limited Soft Tissue Ultrasound"  INDICATIONS: Pain and Soft tissue infection Multiple views of the body part were obtained in real-time with a multi-frequency linear probe  PERFORMED BY: Myself IMAGES ARCHIVED?: Yes SIDE:Right  BODY PART:Upper extremity INTERPRETATION:  Abcess present and Cellulitis present    Medications Ordered in ED Medications  lidocaine-EPINEPHrine (XYLOCAINE W/EPI) 2 %-1:200000 (PF) injection 10 mL (not administered)  Tdap (BOOSTRIX) injection 0.5 mL (not administered)     Initial Impression / Assessment and Plan / ED Course  I have reviewed the triage vital signs and the nursing notes.  Pertinent labs & imaging results that were available during my care of the patient were reviewed by me and considered in my medical decision making (see chart for details).      Patient presents with a small abscess to the right forearm, confirmed on ultrasound at the bedside.  I&D performed without immediate complication. The patient was given instructions for home care as well as return precautions. Patient  voices understanding of these instructions, accepts the plan, and is comfortable with discharge.    Final Clinical Impressions(s) / ED Diagnoses   Final diagnoses:  Abscess    ED Discharge Orders        Ordered    cephALEXin (KEFLEX) 500 MG capsule  4 times daily     07/19/17 1617       Anselm PancoastJoy, Blaise Grieshaber C, PA-C 07/19/17 1619    Lorre NickAllen, Anthony, MD 07/21/17 1112

## 2017-07-22 ENCOUNTER — Other Ambulatory Visit (HOSPITAL_COMMUNITY): Payer: Self-pay | Admitting: *Deleted

## 2017-07-22 DIAGNOSIS — N6320 Unspecified lump in the left breast, unspecified quadrant: Secondary | ICD-10-CM

## 2017-08-06 ENCOUNTER — Ambulatory Visit
Admission: RE | Admit: 2017-08-06 | Discharge: 2017-08-06 | Disposition: A | Discharge status: Home / Self Care | Payer: Self-pay | Source: Ambulatory Visit | Attending: Obstetrics and Gynecology | Admitting: Obstetrics and Gynecology

## 2017-08-06 ENCOUNTER — Encounter (HOSPITAL_COMMUNITY): Payer: Self-pay

## 2017-08-06 ENCOUNTER — Ambulatory Visit (HOSPITAL_COMMUNITY)
Admission: RE | Admit: 2017-08-06 | Discharge: 2017-08-06 | Disposition: A | Discharge status: Home / Self Care | Payer: Self-pay | Source: Ambulatory Visit | Attending: Obstetrics and Gynecology | Admitting: Obstetrics and Gynecology

## 2017-08-06 ENCOUNTER — Ambulatory Visit
Admission: RE | Admit: 2017-08-06 | Discharge: 2017-08-06 | Disposition: A | Discharge status: Home / Self Care | Payer: No Typology Code available for payment source | Source: Ambulatory Visit | Attending: Obstetrics and Gynecology | Admitting: Obstetrics and Gynecology

## 2017-08-06 VITALS — BP 100/72 | Ht 64.0 in

## 2017-08-06 DIAGNOSIS — Z1239 Encounter for other screening for malignant neoplasm of breast: Secondary | ICD-10-CM

## 2017-08-06 DIAGNOSIS — N6320 Unspecified lump in the left breast, unspecified quadrant: Secondary | ICD-10-CM

## 2017-08-06 DIAGNOSIS — N6323 Unspecified lump in the left breast, lower outer quadrant: Secondary | ICD-10-CM

## 2017-08-06 NOTE — Progress Notes (Signed)
Complaints of left breast lump x 3 weeks that is painful at times. Patient rates the pain at a 10 out of 10.  Pap Smear: Pap smear not completed today. [facility]/18/[facility] and normal. Patient is due for a Pap smear. Patient refused Pap smear today and is scheduled to [facility] next Wednesday, August 12, 2017 for a Pap smear. Per patient has no history of an abnormal Pap smear. Last Pap smear result is in Epic.  Physical exam: Breasts Breasts symmetrical. No skin abnormalities bilateral breasts. No nipple retraction bilateral breasts. No nipple discharge bilateral breasts. No lymphadenopathy. No lumps palpated right breast. Palpated a lump within the left breast at 4 o'clock below the nipple within the areola. Complaints of tenderness when palpated left breast lump. [facility] for a diagnostic mammogram and left breast ultrasound. Appointment scheduled for Thursday, August 06, 2017 at 1120.        Pelvic/Bimanual No Pap smear completed today since patient has an appointment scheduled next week to have completed and patient prefers to wait for scheduled appointment.  Smoking History: Patient is a current smoker. Discussed smoking cessation with patient. Referred patient to the [facility] Quitline and gave resources to free smoking cessation classes at [facility].  Patient Navigation: Patient education provided. Access to services provided for patient through [facility] program.   Breast and Cervical Cancer Risk Assessment: Per patient has a history of a maternal aunt that has had breast cancer. Per patient no known genetic mutations or history of radiation treatment to the chest before age 47. Patient has no history of cervical dysplasia, immunocompromised, or DES exposure in-utero.

## 2017-08-06 NOTE — Patient Instructions (Addendum)
Explained breast self awareness with Jenny Evans. Let patient know she is due for her Pap smear. Patient refused Pap smear today and is scheduled to see [facility] next Wednesday, August 12, 2017 for a Pap smear. Let her know [facility] will cover Pap smears every 3 years unless has a history of abnormal Pap smears. [facility] for a diagnostic mamm[facility] and left breast ultrasound. Appointment scheduled for Thursday, August 06, 2017 at 1120. Discussed smoking cessation with patient. Referred patient to the [facility] and gave resources to free smoking cessation classes at [facility]. Jenny Evans verbalized understanding.  Demani Mcbrien, Kathaleen Maserhristine Poll, RN 10:34 AM

## 2017-08-07 ENCOUNTER — Other Ambulatory Visit: Payer: Self-pay

## 2017-08-07 ENCOUNTER — Encounter (HOSPITAL_BASED_OUTPATIENT_CLINIC_OR_DEPARTMENT_OTHER): Payer: Self-pay

## 2017-08-07 ENCOUNTER — Emergency Department (HOSPITAL_BASED_OUTPATIENT_CLINIC_OR_DEPARTMENT_OTHER)
Admission: EM | Admit: 2017-08-07 | Discharge: 2017-08-07 | Disposition: A | Discharge status: Home / Self Care | Payer: No Typology Code available for payment source | Attending: Emergency Medicine | Admitting: Emergency Medicine

## 2017-08-07 DIAGNOSIS — J45909 Unspecified asthma, uncomplicated: Secondary | ICD-10-CM | POA: Insufficient documentation

## 2017-08-07 DIAGNOSIS — F17210 Nicotine dependence, cigarettes, uncomplicated: Secondary | ICD-10-CM | POA: Insufficient documentation

## 2017-08-07 DIAGNOSIS — K029 Dental caries, unspecified: Secondary | ICD-10-CM | POA: Insufficient documentation

## 2017-08-07 MED ORDER — FLUTICASONE PROPIONATE 50 MCG/ACT NA SUSP: 2.0000 | Freq: Every day | NASAL | 0 refills | Status: DC

## 2017-08-07 MED ORDER — NAPROXEN 500 MG PO TABS: 500.0000 mg | ORAL_TABLET | Freq: Two times a day (BID) | ORAL | 0 refills | Status: DC

## 2017-08-07 MED ORDER — IBUPROFEN 800 MG PO TABS
800.0000 mg | ORAL_TABLET | Freq: Once | ORAL | Status: DC
  Filled 2017-08-07: qty 1

## 2017-08-07 MED ORDER — PENICILLIN V POTASSIUM 250 MG PO TABS
500.0000 mg | ORAL_TABLET | Freq: Once | ORAL | Status: AC
  Administered 2017-08-07: 500 mg via ORAL
  Filled 2017-08-07: qty 2

## 2017-08-07 MED ORDER — PENICILLIN V POTASSIUM 500 MG PO TABS: 500.0000 mg | ORAL_TABLET | Freq: Four times a day (QID) | ORAL | 0 refills | Status: AC

## 2017-08-07 NOTE — ED Triage Notes (Signed)
Pt c/o rt lower toothache and a sinus infection

## 2017-08-07 NOTE — ED Provider Notes (Signed)
[facility] EMERGENCY DEPARTMENT Provider Note   CSN: [id number] Arrival date & time: 08/07/17  0117     History   Chief Complaint Chief Complaint  Patient presents with  . Dental Pain    HPI Jenny Evans is a 47 y.o. female.  The history is provided by the patient.  Dental Pain   This is a new problem. The current episode started 12 to 24 hours ago. The problem occurs constantly. The problem has not changed since onset.The pain is at a severity of 10/10. The pain is severe. She has tried nothing for the symptoms. The treatment provided no relief.  Also has sinus congestion.  No f/c/r.  No facial swelling.    Past Medical History:  Diagnosis Date  . Anemia   . Asthma   . Blood transfusion without reported diagnosis   . Chronic pain   . GERD (gastroesophageal reflux disease)   . Migraine   . Sickle cell trait (HCC)   . Ulcer     Patient Active Problem List   Diagnosis Date Noted  . Sickle cell trait (HCC) 06/14/2017  . Symptomatic anemia 06/14/2017  . Blood loss anemia 11/12/2015  . Melena 11/12/2015  . Nonspecific abnormal electrocardiogram (ECG) (EKG) 04/03/2015  . Palpitations 04/03/2015  . Neck pain 04/03/2015  . Colitis 07/10/2014  . Absolute anemia   . Post-operative state 08/26/2013  . Rectocele 08/26/2013  . Anal or rectal pain 07/29/2013  . Headache(784.0) 02/27/2012  . ACUTE BRONCHITIS 03/25/2010  . OBESITY 09/06/2009  . INSOMNIA 01/23/2009  . HYPOKALEMIA 11/01/2008  . DYSPEPSIA&OTHER [facility] DISORDERS FUNCTION STOMACH 06/07/2008  . ALLERGIC RHINITIS 03/23/2008  . CONSTIPATION 01/20/2008  . Shortness of breath 08/27/2007  . TOBACCO ABUSE 03/09/2007  . Migraine 03/09/2007  . ABSCESS, TOOTH 03/09/2007  . GERD (gastroesophageal reflux disease) 03/09/2007  . Migraine variant 02/25/2007  . ECZEMA 02/25/2007    Past Surgical History:  Procedure Laterality Date  . ESOPHAGOGASTRODUODENOSCOPY (EGD) WITH PROPOFOL N/A 04/04/2013   Procedure: ESOPHAGOGASTRODUODENOSCOPY (EGD) WITH PROPOFOL;  Surgeon: Willis Modena, MD;  [facility];  Service: Endoscopy;  Laterality: N/A;  . EXAMINATION UNDER ANESTHESIA N/A 08/10/2013   Procedure: RECTAL EXAM UNDER ANESTHESIA;  Surgeon: Clovis Pu. Cornett, MD;  Location: [facility];  Service: General;  Laterality: N/A;  ligation  . HEMORRHOID SURGERY    . MYRINGOTOMY     age 89    OB History    No data available       Home Medications    Prior to Admission medications   Medication Sig Start Date End Date Taking? Authorizing Provider  cephALEXin (KEFLEX) 500 MG capsule Take 1 capsule (500 mg total) by mouth 4 (four) times daily. Patient not taking: Reported on 08/06/2017 07/19/17   Harolyn Rutherford C, PA-C  ferrous sulfate 325 (65 FE) MG EC tablet Take 1 tablet (325 mg total) by mouth 3 (three) times daily with meals. Patient not taking: Reported on 08/06/2017 07/01/17   Loletta Specter, PA-C  fluticasone [facility]) 50 MCG/ACT nasal spray Place 2 sprays into both nostrils daily. 08/07/17   Alasdair Kleve, MD  naproxen (NAPROSYN) 500 MG tablet Take 1 tablet (500 mg total) by mouth 2 (two) times daily with a meal. Patient not taking: Reported on 08/06/2017 07/16/17   Loletta Specter, PA-C  naproxen (NAPROSYN) 500 MG tablet Take 1 tablet (500 mg total) by mouth 2 (two) times daily with a meal. 08/07/17   Goku Harb, MD  penicillin  v potassium (VEETID) 500 MG tablet Take 1 tablet (500 mg total) by mouth 4 (four) times daily for 7 days. 08/07/17 08/14/17  Meg Niemeier, MD  phenylephrine (NEO-SYNEPHRINE) 1 % nasal spray Place 1 drop into both nostrils every 6 (six) hours as needed for congestion. Patient not taking: Reported on 08/06/2017 07/01/17   Loletta SpecterGomez, Roger David, PA-C  potassium chloride SA (K-DUR,KLOR-CON) 20 MEQ tablet Take 1 tablet (20 mEq total) by mouth daily. Patient not taking: Reported on 08/06/2017 07/03/17   Loletta SpecterGomez, Roger David, PA-C  senna-docusate  (SENOKOT-S) 8.6-50 MG tablet Take 2 tablets by mouth daily. Patient not taking: Reported on 08/06/2017 07/01/17   Loletta SpecterGomez, Roger David, PA-C    Family History Family History  Problem Relation Age of Onset  . Heart disease Mother        Arrythmia  . Kidney disease Mother   . Hypertension Mother   . COPD Mother   . Diabetes Mother   . Cancer Mother        lung  . Heart disease Father   . Crohn's disease Maternal Aunt   . Breast cancer Maternal Aunt        7740's    Social History Social History   Tobacco Use  . Smoking status: Current Some Day Smoker    Packs/day: 0.25    Years: 15.00    Pack years: 3.75    Types: Cigarettes  . Smokeless tobacco: Never Used  Substance Use Topics  . Alcohol use: No  . Drug use: No     Allergies   Hydrocodone-acetaminophen   Review of Systems Review of Systems  Constitutional: Negative for fever.  HENT: Positive for congestion and dental problem. Negative for drooling, ear discharge, ear pain, facial swelling, sore throat, tinnitus, trouble swallowing and voice change.   Respiratory: Negative for cough and shortness of breath.   Cardiovascular: Negative for chest pain.  Gastrointestinal: Negative for abdominal pain.  Musculoskeletal: Negative for neck pain and neck stiffness.  Hematological: Does not bruise/bleed easily.  All other systems reviewed and are negative.    Physical Exam Updated Vital Signs BP 133/74   Pulse 74   Temp 98.3 F (36.8 C) (Oral)   Resp 18   Ht 5\' 4"  (1.626 m)   Wt 56.2 kg (124 lb)   LMP 07/26/2017 (Exact Date)   SpO2 99%   BMI 21.28 kg/m   Physical Exam  Constitutional: She is oriented to person, place, and time. She appears well-developed and well-nourished.  HENT:  Head: Normocephalic and atraumatic.  Mouth/Throat: Oropharynx is clear and moist. No trismus in the jaw. Dental caries present. No uvula swelling. No oropharyngeal exudate.    Eyes: Conjunctivae are normal. Pupils are equal, round,  and reactive to light.  Neck: Normal range of motion. Neck supple. No tracheal deviation present.  Intact phonation  Cardiovascular: Normal rate, regular rhythm, normal heart sounds and intact distal pulses.  Pulmonary/Chest: Effort normal and breath sounds normal. No stridor. She has no wheezes. She has no rales.  Abdominal: Soft. Bowel sounds are normal. There is no tenderness.  Musculoskeletal: Normal range of motion.  Neurological: She is alert and oriented to person, place, and time.  Skin: Skin is warm and dry. Capillary refill takes less than 2 seconds.  Psychiatric: Her affect is angry.  Nursing note and vitals reviewed.    ED Treatments / Results   Radiology [facility] Left Inc Axilla  Result Date: 08/06/2017 CLINICAL DATA:  Patient presents  with a small palpable lump adjacent to her left nipple which she has noted for approximately 1 year. EXAM: DIGITAL DIAGNOSTIC BILATERAL MAMMOGRAM WITH CAD AND TOMO ULTRASOUND LEFT BREAST COMPARISON:  None ACR Breast Density Category c: The breast tissue is heterogeneously dense, which may obscure small masses. FINDINGS: There are no discrete masses, areas of architectural distortion, areas of significant asymmetry or suspicious calcifications. Mammographic images were processed with CAD. On physical exam, there is a small superficial smooth mass lateral and slightly inferior to the left nipple, which may reside in the skin. Targeted ultrasound is performed, showing a complicated cyst within the skin, with a subtle tract to the skin surface. This measures 8 x 3 x 6 mm. This corresponds to the palpable abnormality. IMPRESSION: 1. No evidence of breast malignancy. 2. Benign left breast sebaceous cyst or inclusion cyst. RECOMMENDATION: Screening mammogram in one year.(Code:SM-B-01Y) I have discussed the findings and recommendations with the patient. Results were also provided in writing at the conclusion of the visit. If applicable, a reminder  letter will be sent to the patient regarding the next appointment. BI-RADS CATEGORY  2: Benign. Electronically Signed   By: Amie Portland M.D.   On: 08/06/2017 11:56   Mm Diag Breast Tomo Bilateral  Result Date: 08/06/2017 CLINICAL DATA:  Patient presents with a small palpable lump adjacent to her left nipple which she has noted for approximately 1 year. EXAM: DIGITAL DIAGNOSTIC BILATERAL MAMMOGRAM WITH CAD AND TOMO ULTRASOUND LEFT BREAST COMPARISON:  None ACR Breast Density Category c: The breast tissue is heterogeneously dense, which may obscure small masses. FINDINGS: There are no discrete masses, areas of architectural distortion, areas of significant asymmetry or suspicious calcifications. Mammographic images were processed with CAD. On physical exam, there is a small superficial smooth mass lateral and slightly inferior to the left nipple, which may reside in the skin. Targeted ultrasound is performed, showing a complicated cyst within the skin, with a subtle tract to the skin surface. This measures 8 x 3 x 6 mm. This corresponds to the palpable abnormality. IMPRESSION: 1. No evidence of breast malignancy. 2. Benign left breast sebaceous cyst or inclusion cyst. RECOMMENDATION: Screening mammogram in one year.(Code:SM-B-01Y) I have discussed the findings and recommendations with the patient. Results were also provided in writing at the conclusion of the visit. If applicable, a reminder letter will be sent to the patient regarding the next appointment. BI-RADS CATEGORY  2: Benign. Electronically Signed   By: Amie Portland M.D.   On: 08/06/2017 11:56    Procedures Procedures (including critical care time)  Medications Ordered in ED Medications  ibuprofen (ADVIL,MOTRIN) tablet 800 mg (800 mg Oral Refused 08/07/17 0521)  penicillin v potassium (VEETID) tablet 500 mg (500 mg Oral Given 08/07/17 0521)     Patient immediately informed me she needs something stronger for her pain than what she was  offered by the nurse.  EDP states we would write for naproxen or voltaren and she stated she did not want that.  EDP also stated we would provide the dental resource guide.  Patient was angry and EDP politely referred to the department policy, no opioid pain medication for dental pain.    Final Clinical Impressions(s) / ED Diagnoses   Final diagnoses:  Dental caries   Given her history of IVDU EDP and department policy EDP will not be prescribing narcotics.  The patient meets no criteria and I see signs of a sinus infection.    Return for weakness, numbness, changes in  vision or speech,  fevers > 100.4 unrelieved by medication, shortness of breath, intractable vomiting, or diarrhea, abdominal pain, Inability to tolerate liquids or food, cough, altered mental status or any concerns. No signs of systemic illness or infection. The patient is nontoxic-appearing on exam and vital signs are within normal limits.    I have reviewed the triage vital signs and the nursing notes. Pertinent labs &imaging results that were available during my care of the patient were reviewed by me and considered in my medical decision making (see chart for details).  After history, exam, and medical workup I feel the patient has been appropriately medically screened and is safe for discharge home. Pertinent diagnoses were discussed with the patient. Patient was given return precautions.     ED Discharge Orders        Ordered    penicillin v potassium (VEETID) 500 MG tablet  4 times daily     08/07/17 0531    naproxen (NAPROSYN) 500 MG tablet  2 times daily with meals     08/07/17 0531    fluticasone (FLONASE) 50 MCG/ACT nasal spray  Daily     08/07/17 0531       Ahmyah Gidley, MD 08/07/17 1610

## 2017-08-12 ENCOUNTER — Ambulatory Visit (INDEPENDENT_AMBULATORY_CARE_PROVIDER_SITE_OTHER): Payer: Self-pay | Admitting: Physician Assistant

## 2017-08-18 ENCOUNTER — Ambulatory Visit (INDEPENDENT_AMBULATORY_CARE_PROVIDER_SITE_OTHER): Payer: Self-pay | Admitting: Physician Assistant

## 2017-08-25 ENCOUNTER — Emergency Department (HOSPITAL_BASED_OUTPATIENT_CLINIC_OR_DEPARTMENT_OTHER)
Admission: EM | Admit: 2017-08-25 | Discharge: 2017-08-25 | Disposition: A | Discharge status: Home / Self Care | Payer: No Typology Code available for payment source | Attending: Emergency Medicine | Admitting: Emergency Medicine

## 2017-08-25 ENCOUNTER — Encounter (HOSPITAL_BASED_OUTPATIENT_CLINIC_OR_DEPARTMENT_OTHER): Payer: Self-pay | Admitting: Emergency Medicine

## 2017-08-25 ENCOUNTER — Other Ambulatory Visit: Payer: Self-pay

## 2017-08-25 DIAGNOSIS — R0981 Nasal congestion: Secondary | ICD-10-CM | POA: Insufficient documentation

## 2017-08-25 DIAGNOSIS — Z5321 Procedure and treatment not carried out due to patient leaving prior to being seen by health care provider: Secondary | ICD-10-CM | POA: Insufficient documentation

## 2017-08-25 DIAGNOSIS — R05 Cough: Secondary | ICD-10-CM | POA: Insufficient documentation

## 2017-08-25 NOTE — ED Notes (Signed)
[facility]

## 2017-08-25 NOTE — ED Notes (Signed)
No answer when called to tx room 

## 2017-08-25 NOTE — ED Triage Notes (Signed)
Patient states that for the last couple of days she has had a sinus infection and cough

## 2017-08-26 ENCOUNTER — Telehealth (INDEPENDENT_AMBULATORY_CARE_PROVIDER_SITE_OTHER): Payer: Self-pay | Admitting: Physician Assistant

## 2017-08-26 NOTE — ED Notes (Signed)
3/13/20019, 15:24, Attempted follow-up call.  No answer.

## 2017-08-26 NOTE — Telephone Encounter (Signed)
Patient wants to know if PA Lily KocherGomez can prescribe something for sinus infection or OTC  Thank you

## 2017-08-27 ENCOUNTER — Ambulatory Visit (INDEPENDENT_AMBULATORY_CARE_PROVIDER_SITE_OTHER): Payer: Self-pay | Admitting: Physician Assistant

## 2017-08-27 NOTE — Telephone Encounter (Signed)
Needs appointment

## 2017-08-27 NOTE — Telephone Encounter (Signed)
FWD to PCP. Jackelyne Sayer S Joyanne Eddinger, CMA  

## 2017-08-28 NOTE — Telephone Encounter (Signed)
Left patient a message letting her know she needs to be evaluated for her sinus infection before anything can be prescribed. Asked her to call the office and schedule appointment. Maryjean Mornempestt S Anwen Cannedy, CMA

## 2017-09-04 ENCOUNTER — Ambulatory Visit (INDEPENDENT_AMBULATORY_CARE_PROVIDER_SITE_OTHER): Payer: Self-pay | Admitting: Physician Assistant

## 2017-09-15 ENCOUNTER — Encounter (HOSPITAL_COMMUNITY): Payer: Self-pay | Admitting: Emergency Medicine

## 2017-09-15 ENCOUNTER — Emergency Department (HOSPITAL_COMMUNITY)
Admission: EM | Admit: 2017-09-15 | Discharge: 2017-09-15 | Disposition: A | Discharge status: Home / Self Care | Payer: No Typology Code available for payment source | Attending: Emergency Medicine | Admitting: Emergency Medicine

## 2017-09-15 DIAGNOSIS — Z79899 Other long term (current) drug therapy: Secondary | ICD-10-CM | POA: Insufficient documentation

## 2017-09-15 DIAGNOSIS — F17210 Nicotine dependence, cigarettes, uncomplicated: Secondary | ICD-10-CM | POA: Insufficient documentation

## 2017-09-15 DIAGNOSIS — J0120 Acute ethmoidal sinusitis, unspecified: Secondary | ICD-10-CM | POA: Insufficient documentation

## 2017-09-15 DIAGNOSIS — D573 Sickle-cell trait: Secondary | ICD-10-CM | POA: Insufficient documentation

## 2017-09-15 DIAGNOSIS — J45909 Unspecified asthma, uncomplicated: Secondary | ICD-10-CM | POA: Insufficient documentation

## 2017-09-15 MED ORDER — OXYMETAZOLINE HCL 0.05 % NA SOLN: 1.0000 | Freq: Two times a day (BID) | NASAL | 0 refills | Status: DC

## 2017-09-15 MED ORDER — FEXOFENADINE-PSEUDOEPHED ER 60-120 MG PO TB12: 1.0000 | ORAL_TABLET | Freq: Two times a day (BID) | ORAL | 0 refills | Status: DC | PRN

## 2017-09-15 MED ORDER — AZITHROMYCIN 250 MG PO TABS: 250.0000 mg | ORAL_TABLET | Freq: Every day | ORAL | 0 refills | Status: DC

## 2017-09-15 NOTE — ED Triage Notes (Signed)
Pt reports sinus congestion x 2 days. reports taking OTC medications for symptoms. Denies cough, sore throat and ear pain at this time

## 2017-09-17 NOTE — ED Provider Notes (Signed)
[facility]-EMERGENCY DEPT Provider Note   CSN: [id number] Arrival date & time: 09/15/17  4098     History   Chief Complaint Chief Complaint  Patient presents with  . Nasal Congestion    HPI Jenny Evans is a 47 y.o. female.  HPI   47 year old female with sinus/facial pain and pressure.  Onset 2-3 days ago.  Pain is in the forehead and behind the eyes.  Worse with leaning forward.  Feels congested.  No fevers or chills.  No cough.  No sore throat or ear pain.  Past Medical History:  Diagnosis Date  . Anemia   . Asthma   . Blood transfusion without reported diagnosis   . Chronic pain   . GERD (gastroesophageal reflux disease)   . Migraine   . Sickle cell trait (HCC)   . Ulcer     Patient Active Problem List   Diagnosis Date Noted  . Sickle cell trait (HCC) 06/14/2017  . Symptomatic anemia 06/14/2017  . Blood loss anemia 11/12/2015  . Melena 11/12/2015  . Nonspecific abnormal electrocardiogram (ECG) (EKG) 04/03/2015  . Palpitations 04/03/2015  . Neck pain 04/03/2015  . Colitis 07/10/2014  . Absolute anemia   . Post-operative state 08/26/2013  . Rectocele 08/26/2013  . Anal or rectal pain 07/29/2013  . Headache(784.0) 02/27/2012  . ACUTE BRONCHITIS 03/25/2010  . OBESITY 09/06/2009  . INSOMNIA 01/23/2009  . HYPOKALEMIA 11/01/2008  . DYSPEPSIA&OTHER [facility] DISORDERS FUNCTION STOMACH 06/07/2008  . ALLERGIC RHINITIS 03/23/2008  . CONSTIPATION 01/20/2008  . Shortness of breath 08/27/2007  . TOBACCO ABUSE 03/09/2007  . Migraine 03/09/2007  . ABSCESS, TOOTH 03/09/2007  . GERD (gastroesophageal reflux disease) 03/09/2007  . Migraine variant 02/25/2007  . ECZEMA 02/25/2007    Past Surgical History:  Procedure Laterality Date  . ESOPHAGOGASTRODUODENOSCOPY (EGD) WITH PROPOFOL N/A 04/04/2013   Procedure: ESOPHAGOGASTRODUODENOSCOPY (EGD) WITH PROPOFOL;  Surgeon: Willis Modena, MD;  Location: [facility];  Service: Endoscopy;  Laterality:  N/A;  . EXAMINATION UNDER ANESTHESIA N/A 08/10/2013   Procedure: RECTAL EXAM UNDER ANESTHESIA;  Surgeon: Clovis Pu. Cornett, MD;  Location: [facility];  Service: General;  Laterality: N/A;  ligation  . HEMORRHOID SURGERY    . MYRINGOTOMY     age 86     OB History   None      Home Medications    Prior to Admission medications   Medication Sig Start Date End Date Taking? Authorizing Provider  azithromycin (ZITHROMAX) 250 MG tablet Take 1 tablet (250 mg total) by mouth daily. Take first 2 tablets together, then 1 every day until finished. 09/15/17   Raeford Razor, MD  ferrous sulfate 325 (65 FE) MG EC tablet Take 1 tablet (325 mg total) by mouth 3 (three) times daily with meals. Patient not taking: Reported on 08/06/2017 07/01/17   Loletta Specter, PA-C  fexofenadine-pseudoephedrine (ALLEGRA-D) 60-120 MG 12 hr tablet Take 1 tablet by mouth 2 (two) times daily as needed (congestion). 09/15/17   Raeford Razor, MD  fluticasone (FLONASE) 50 MCG/ACT nasal spray Place 2 sprays into both nostrils daily. 08/07/17   Palumbo, April, MD  naproxen (NAPROSYN) 500 MG tablet Take 1 tablet (500 mg total) by mouth 2 (two) times daily with a meal. Patient not taking: Reported on 08/06/2017 07/16/17   Loletta Specter, PA-C  naproxen (NAPROSYN) 500 MG tablet Take 1 tablet (500 mg total) by mouth 2 (two) times daily with a meal. 08/07/17   Palumbo, April, MD  oxymetazoline Vickie Epley  NASAL SPRAY) 0.05 % nasal spray Place 1 spray into both nostrils 2 (two) times daily. 09/15/17   Raeford Razor, MD  phenylephrine (NEO-SYNEPHRINE) 1 % nasal spray Place 1 drop into both nostrils every 6 (six) hours as needed for congestion. Patient not taking: Reported on 08/06/2017 07/01/17   Loletta Specter, PA-C  potassium chloride SA (K-DUR,KLOR-CON) 20 MEQ tablet Take 1 tablet (20 mEq total) by mouth daily. Patient not taking: Reported on 08/06/2017 07/03/17   Loletta Specter, PA-C  senna-docusate (SENOKOT-S) 8.6-50  MG tablet Take 2 tablets by mouth daily. Patient not taking: Reported on 08/06/2017 07/01/17   Loletta Specter, PA-C    Family History Family History  Problem Relation Age of Onset  . Heart disease Mother        Arrythmia  . Kidney disease Mother   . Hypertension Mother   . COPD Mother   . Diabetes Mother   . Cancer Mother        lung  . Heart disease Father   . Crohn's disease Maternal Aunt   . Breast cancer Maternal Aunt        69's    Social History Social History   Tobacco Use  . Smoking status: Current Some Day Smoker    Packs/day: 0.25    Years: 15.00    Pack years: 3.75    Types: Cigarettes  . Smokeless tobacco: Never Used  Substance Use Topics  . Alcohol use: No  . Drug use: No     Allergies   Hydrocodone-acetaminophen and Penicillins   Review of Systems Review of Systems All systems reviewed and negative, other than as noted in HPI.   Physical Exam Updated Vital Signs BP 109/78 (BP Location: Right Arm)   Pulse 82   Temp 98.4 F (36.9 C) (Oral)   Resp 18   LMP 09/01/2017   SpO2 100%   Physical Exam  Constitutional: She appears well-developed and well-nourished. No distress.  HENT:  Head: Normocephalic and atraumatic.  Tenderness to palpation of the frontal sinuses.  No overlying skin changes.  Nontoxic appearance.  Oropharynx is clear.  Neck is supple.  Eyes: Conjunctivae are normal. Right eye exhibits no discharge. Left eye exhibits no discharge.  Neck: Neck supple.  Cardiovascular: Normal rate, regular rhythm and normal heart sounds. Exam reveals no gallop and no friction rub.  No murmur heard. Pulmonary/Chest: Effort normal and breath sounds normal. No respiratory distress.  Abdominal: Soft. She exhibits no distension. There is no tenderness.  Musculoskeletal: She exhibits no edema or tenderness.  Neurological: She is alert.  Skin: Skin is warm and dry.  Psychiatric: She has a normal mood and affect. Her behavior is normal. Thought  content normal.  Nursing note and vitals reviewed.    ED Treatments / Results  Labs (all labs ordered are listed, but only abnormal results are displayed) Labs Reviewed - No data to display  EKG None  Radiology No results found.  Procedures Procedures (including critical care time)  Medications Ordered in ED Medications - No data to display   Initial Impression / Assessment and Plan / ED Course  I have reviewed the triage vital signs and the nursing notes.  Pertinent labs & imaging results that were available during my care of the patient were reviewed by me and considered in my medical decision making (see chart for details).     Symptoms consistent with sinusitis.  I doubt serious bacterial infection plan decongestants and antibiotics.  Return precautions  discussed.  Final Clinical Impressions(s) / ED Diagnoses   Final diagnoses:  Acute ethmoidal sinusitis, recurrence not specified    ED Discharge Orders        Ordered    oxymetazoline (AFRIN NASAL SPRAY) 0.05 % nasal spray  2 times daily     09/15/17 0937    fexofenadine-pseudoephedrine (ALLEGRA-D) 60-120 MG 12 hr tablet  2 times daily PRN     09/15/17 0937    azithromycin (ZITHROMAX) 250 MG tablet  Daily     09/15/17 0937       Raeford RazorKohut, Corry Ihnen, MD 09/17/17 1719

## 2017-09-29 ENCOUNTER — Other Ambulatory Visit: Payer: Self-pay

## 2017-09-29 ENCOUNTER — Emergency Department (HOSPITAL_COMMUNITY)
Admission: EM | Admit: 2017-09-29 | Discharge: 2017-09-29 | Disposition: A | Discharge status: Home / Self Care | Payer: No Typology Code available for payment source | Attending: Emergency Medicine | Admitting: Emergency Medicine

## 2017-09-29 ENCOUNTER — Emergency Department (HOSPITAL_COMMUNITY)
Admission: EM | Admit: 2017-09-29 | Discharge: 2017-09-29 | Disposition: A | Discharge status: Home / Self Care | Payer: Self-pay | Attending: Emergency Medicine | Admitting: Emergency Medicine

## 2017-09-29 ENCOUNTER — Encounter (HOSPITAL_COMMUNITY): Payer: Self-pay | Admitting: Emergency Medicine

## 2017-09-29 ENCOUNTER — Emergency Department (HOSPITAL_COMMUNITY): Payer: Self-pay

## 2017-09-29 ENCOUNTER — Ambulatory Visit (INDEPENDENT_AMBULATORY_CARE_PROVIDER_SITE_OTHER): Payer: No Typology Code available for payment source | Admitting: Nurse Practitioner

## 2017-09-29 DIAGNOSIS — R5383 Other fatigue: Secondary | ICD-10-CM | POA: Insufficient documentation

## 2017-09-29 DIAGNOSIS — D649 Anemia, unspecified: Secondary | ICD-10-CM | POA: Insufficient documentation

## 2017-09-29 DIAGNOSIS — Z5321 Procedure and treatment not carried out due to patient leaving prior to being seen by health care provider: Secondary | ICD-10-CM | POA: Insufficient documentation

## 2017-09-29 DIAGNOSIS — M722 Plantar fascial fibromatosis: Secondary | ICD-10-CM | POA: Insufficient documentation

## 2017-09-29 DIAGNOSIS — F17210 Nicotine dependence, cigarettes, uncomplicated: Secondary | ICD-10-CM | POA: Insufficient documentation

## 2017-09-29 DIAGNOSIS — J45909 Unspecified asthma, uncomplicated: Secondary | ICD-10-CM | POA: Insufficient documentation

## 2017-09-29 LAB — URINALYSIS, ROUTINE W REFLEX MICROSCOPIC
Bilirubin Urine: NEGATIVE
GLUCOSE, UA: NEGATIVE mg/dL
HGB URINE DIPSTICK: NEGATIVE
Ketones, ur: NEGATIVE mg/dL
Leukocytes, UA: NEGATIVE
Nitrite: NEGATIVE
PH: 6 (ref 5.0–8.0)
Protein, ur: NEGATIVE mg/dL
SPECIFIC GRAVITY, URINE: 1.013 (ref 1.005–1.030)

## 2017-09-29 LAB — BASIC METABOLIC PANEL
ANION GAP: 10 (ref 5–15)
BUN: 7 mg/dL (ref 6–20)
CHLORIDE: 106 mmol/L (ref 101–111)
CO2: 22 mmol/L (ref 22–32)
Calcium: 8.9 mg/dL (ref 8.9–10.3)
Creatinine, Ser: 0.64 mg/dL (ref 0.44–1.00)
GFR calc non Af Amer: >60 mL/min (ref >60)
Glucose, Bld: 86 mg/dL (ref 65–99)
Potassium: 3.4 mmol/L — ABNORMAL LOW (ref 3.5–5.1)
Sodium: 138 mmol/L (ref 135–145)

## 2017-09-29 LAB — CBC
HEMATOCRIT: 25.4 % — AB (ref 36.0–46.0)
HEMOGLOBIN: 8.3 g/dL — AB (ref 12.0–15.0)
MCH: 25 pg — AB (ref 26.0–34.0)
MCHC: 32.7 g/dL (ref 30.0–36.0)
MCV: 76.5 fL — AB (ref 78.0–100.0)
Platelets: 415 10*3/uL — ABNORMAL HIGH (ref 150–400)
RBC: 3.32 MIL/uL — AB (ref 3.87–5.11)
RDW: 16.8 % — ABNORMAL HIGH (ref 11.5–15.5)
WBC: 5.4 10*3/uL (ref 4.0–10.5)

## 2017-09-29 LAB — I-STAT BETA HCG BLOOD, ED (MC, WL, AP ONLY): I-stat hCG, quantitative: <5 m[IU]/mL (ref <5)

## 2017-09-29 NOTE — ED Triage Notes (Signed)
Patient c/o right foot pain x2 weeks. Denies injury. Ambulatory.

## 2017-09-29 NOTE — Discharge Instructions (Addendum)
See your Physician for recheck of your hemoglobin.   Continue taking iron supplement.

## 2017-09-29 NOTE — ED Triage Notes (Signed)
Patient with history of anemia complains of fatigue for the last couple of days. Denies any recent bleeding. States she thinks her blood counts are low. Also complains of pain to the lateral side of the right foot, no apparent injury noted.

## 2017-09-29 NOTE — ED Provider Notes (Signed)
[facility]-EMERGENCY DEPT Provider Note   CSN: [id number] Arrival date & time: 09/29/17  1741     History   Chief Complaint Chief Complaint  Patient presents with  . Foot Pain    HPI Jenny Evans is a 47 y.o. female.  The history is provided by the patient. No language interpreter was used.  Foot Pain  This is a new problem. The current episode started more than 1 week ago. The problem occurs constantly. The problem has been gradually worsening. Nothing aggravates the symptoms. Nothing relieves the symptoms. She has tried nothing for the symptoms. The treatment provided no relief.  [facility].  Pt reports she has a history of anemia and wanted her hemoglobin checked.  Pt also complains of foot pain  Past Medical History:  Diagnosis Date  . Anemia   . Asthma   . Blood transfusion without reported diagnosis   . Chronic pain   . GERD (gastroesophageal reflux disease)   . Migraine   . Sickle cell trait (HCC)   . Ulcer     Patient Active Problem List   Diagnosis Date Noted  . Sickle cell trait (HCC) 06/14/2017  . Symptomatic anemia 06/14/2017  . Blood loss anemia 11/12/2015  . Melena 11/12/2015  . Nonspecific abnormal electrocardiogram (ECG) (EKG) 04/03/2015  . Palpitations 04/03/2015  . Neck pain 04/03/2015  . Colitis 07/10/2014  . Absolute anemia   . Post-operative state 08/26/2013  . Rectocele 08/26/2013  . Anal or rectal pain 07/29/2013  . Headache(784.0) 02/27/2012  . ACUTE BRONCHITIS 03/25/2010  . OBESITY 09/06/2009  . INSOMNIA 01/23/2009  . HYPOKALEMIA 11/01/2008  . DYSPEPSIA&OTHER [facility] DISORDERS FUNCTION STOMACH 06/07/2008  . ALLERGIC RHINITIS 03/23/2008  . CONSTIPATION 01/20/2008  . Shortness of breath 08/27/2007  . TOBACCO ABUSE 03/09/2007  . Migraine 03/09/2007  . ABSCESS, TOOTH 03/09/2007  . GERD (gastroesophageal reflux disease) 03/09/2007  . Migraine variant 02/25/2007  . ECZEMA 02/25/2007    Past  Surgical History:  Procedure Laterality Date  . ESOPHAGOGASTRODUODENOSCOPY (EGD) WITH PROPOFOL N/A 04/04/2013   Procedure: ESOPHAGOGASTRODUODENOSCOPY (EGD) WITH PROPOFOL;  Surgeon: Willis Modena, MD;  [facility];  Service: Endoscopy;  Laterality: N/A;  . EXAMINATION UNDER ANESTHESIA N/A 08/10/2013   Procedure: RECTAL EXAM UNDER ANESTHESIA;  Surgeon: Clovis Pu. Cornett, MD;  Location: [facility];  Service: General;  Laterality: N/A;  ligation  . HEMORRHOID SURGERY    . MYRINGOTOMY     age 31     OB History   None      Home Medications    Prior to Admission medications   Medication Sig Start Date End Date Taking? Authorizing Provider  azithromycin (ZITHROMAX) 250 MG tablet Take 1 tablet (250 mg total) by mouth daily. Take first 2 tablets together, then 1 every day until finished. 09/15/17   Raeford Razor, MD  ferrous sulfate 325 (65 FE) MG EC tablet Take 1 tablet (325 mg total) by mouth 3 (three) times daily with meals. Patient not taking: Reported on 08/06/2017 07/01/17   Loletta Specter, PA-C  fexofenadine-pseudoephedrine (ALLEGRA-D) 60-120 MG 12 hr tablet Take 1 tablet by mouth 2 (two) times daily as needed (congestion). 09/15/17   Raeford Razor, MD  fluticasone (FLONASE) 50 MCG/ACT nasal spray Place 2 sprays into both nostrils daily. 08/07/17   Palumbo, April, MD  naproxen (NAPROSYN) 500 MG tablet Take 1 tablet (500 mg total) by mouth 2 (two) times daily with a meal. Patient not taking: Reported  on 08/06/2017 07/16/17   Loletta Specter, PA-C  naproxen (NAPROSYN) 500 MG tablet Take 1 tablet (500 mg total) by mouth 2 (two) times daily with a meal. 08/07/17   Palumbo, April, MD  oxymetazoline (AFRIN NASAL SPRAY) 0.05 % nasal spray Place 1 spray into both nostrils 2 (two) times daily. 09/15/17   Raeford Razor, MD  phenylephrine (NEO-SYNEPHRINE) 1 % nasal spray Place 1 drop into both nostrils every 6 (six) hours as needed for congestion. Patient not taking: Reported  on 08/06/2017 07/01/17   Loletta Specter, PA-C  potassium chloride SA (K-DUR,KLOR-CON) 20 MEQ tablet Take 1 tablet (20 mEq total) by mouth daily. Patient not taking: Reported on 08/06/2017 07/03/17   Loletta Specter, PA-C  senna-docusate (SENOKOT-S) 8.6-50 MG tablet Take 2 tablets by mouth daily. Patient not taking: Reported on 08/06/2017 07/01/17   Loletta Specter, PA-C    Family History Family History  Problem Relation Age of Onset  . Heart disease Mother        Arrythmia  . Kidney disease Mother   . Hypertension Mother   . COPD Mother   . Diabetes Mother   . Cancer Mother        lung  . Heart disease Father   . Crohn's disease Maternal Aunt   . Breast cancer Maternal Aunt        22's    Social History Social History   Tobacco Use  . Smoking status: Current Some Day Smoker    Packs/day: 0.25    Years: 15.00    Pack years: 3.75    Types: Cigarettes  . Smokeless tobacco: Never Used  Substance Use Topics  . Alcohol use: No  . Drug use: No     Allergies   Hydrocodone-acetaminophen and Penicillins   Review of Systems Review of Systems  Musculoskeletal: Positive for myalgias.  All other systems reviewed and are negative.    Physical Exam Updated Vital Signs BP 107/65   Pulse 84   Temp 98.2 F (36.8 C) (Oral)   Resp 18   LMP 09/22/2017   SpO2 100%   Physical Exam  Constitutional: She is oriented to person, place, and time. She appears well-developed and well-nourished.  HENT:  Head: Normocephalic.  Eyes: Pupils are equal, round, and reactive to light. EOM are normal.  Neck: Normal range of motion.  Cardiovascular: Normal rate.  Pulmonary/Chest: Effort normal.  Abdominal: She exhibits no distension.  Musculoskeletal: Normal range of motion. She exhibits tenderness.  Tender outer foot along 5th metatarsal base.  From nv nad ns intact   Neurological: She is alert and oriented to person, place, and time.  Psychiatric: She has a normal mood and  affect.  Nursing note and vitals reviewed.    ED Treatments / Results  Labs (all labs ordered are listed, but only abnormal results are displayed) Labs Reviewed - No data to display  EKG None  Radiology Dg Foot Complete Right  Result Date: 09/29/2017 CLINICAL DATA:  Lateral right foot pain at the mid metatarsal level for 2 weeks. Initial encounter. EXAM: RIGHT FOOT COMPLETE - 3+ VIEW COMPARISON:  None. FINDINGS: There is no evidence of fracture or dislocation. There is no evidence of arthropathy or other focal bone abnormality. Soft tissues are unremarkable. IMPRESSION: Negative. Electronically Signed   By: Sebastian Ache M.D.   On: 09/29/2017 20:03    Procedures Procedures (including critical care time)  Medications Ordered in ED Medications - No data to display  Initial Impression / Assessment and Plan / ED Course  I have reviewed the triage vital signs and the nursing notes.  Pertinent labs & imaging results that were available during my care of the patient were reviewed by me and considered in my medical decision making (see chart for details).     MDM  Labs reviewed.  Pt counseled on anemia.  Hemoglobin is in pt's usual range.  Pt is wanting to know if iron transfusion would help.  (I advised her to discuss with her primary)  Pt is not currently taking her vitamin supplements.  Pt is advised to take as directed.   Pt counseled on plantar fascitis.   Final Clinical Impressions(s) / ED Diagnoses   Final diagnoses:  Plantar fasciitis of right foot  Anemia, unspecified type    ED Discharge Orders    None    An After Visit Summary was printed and given to the patient.    Elson AreasSofia, Lashundra Shiveley K, Cordelia Poche-C 09/29/17 2218    Alvira MondaySchlossman, Erin, MD 09/30/17 [phone]

## 2017-12-10 ENCOUNTER — Encounter (HOSPITAL_COMMUNITY): Payer: Self-pay | Admitting: Emergency Medicine

## 2017-12-10 ENCOUNTER — Emergency Department (HOSPITAL_COMMUNITY)
Admission: EM | Admit: 2017-12-10 | Discharge: 2017-12-10 | Disposition: A | Discharge status: Home / Self Care | Payer: Managed Care, Other (non HMO) | Attending: Emergency Medicine | Admitting: Emergency Medicine

## 2017-12-10 DIAGNOSIS — E876 Hypokalemia: Secondary | ICD-10-CM | POA: Diagnosis not present

## 2017-12-10 DIAGNOSIS — R531 Weakness: Secondary | ICD-10-CM | POA: Diagnosis not present

## 2017-12-10 DIAGNOSIS — R5383 Other fatigue: Secondary | ICD-10-CM | POA: Diagnosis not present

## 2017-12-10 DIAGNOSIS — J45909 Unspecified asthma, uncomplicated: Secondary | ICD-10-CM | POA: Diagnosis not present

## 2017-12-10 DIAGNOSIS — F17210 Nicotine dependence, cigarettes, uncomplicated: Secondary | ICD-10-CM | POA: Insufficient documentation

## 2017-12-10 LAB — CBC WITH DIFFERENTIAL/PLATELET
BASOS ABS: 0 10*3/uL (ref 0.0–0.1)
Basophils Relative: 1 %
EOS ABS: 0.1 10*3/uL (ref 0.0–0.7)
EOS PCT: 2 %
HCT: 29.2 % — ABNORMAL LOW (ref 36.0–46.0)
Hemoglobin: 9.6 g/dL — ABNORMAL LOW (ref 12.0–15.0)
LYMPHS PCT: 29 %
Lymphs Abs: 1.7 10*3/uL (ref 0.7–4.0)
MCH: 23.9 pg — ABNORMAL LOW (ref 26.0–34.0)
MCHC: 32.9 g/dL (ref 30.0–36.0)
MCV: 72.8 fL — AB (ref 78.0–100.0)
MONO ABS: 0.6 10*3/uL (ref 0.1–1.0)
Monocytes Relative: 11 %
Neutro Abs: 3.5 10*3/uL (ref 1.7–7.7)
Neutrophils Relative %: 57 %
PLATELETS: 761 10*3/uL — AB (ref 150–400)
RBC: 4.01 MIL/uL (ref 3.87–5.11)
RDW: 17.6 % — AB (ref 11.5–15.5)
WBC: 6.1 10*3/uL (ref 4.0–10.5)

## 2017-12-10 LAB — COMPREHENSIVE METABOLIC PANEL
ALT: 11 U/L (ref 0–44)
AST: 19 U/L (ref 15–41)
Albumin: 4.1 g/dL (ref 3.5–5.0)
Alkaline Phosphatase: 68 U/L (ref 38–126)
Anion gap: 8 (ref 5–15)
BUN: 7 mg/dL (ref 6–20)
CHLORIDE: 108 mmol/L (ref 98–111)
CO2: 24 mmol/L (ref 22–32)
Calcium: 9.4 mg/dL (ref 8.9–10.3)
Creatinine, Ser: 0.64 mg/dL (ref 0.44–1.00)
GFR calc Af Amer: >60 mL/min (ref >60)
Glucose, Bld: 90 mg/dL (ref 70–99)
POTASSIUM: 3.3 mmol/L — AB (ref 3.5–5.1)
Sodium: 140 mmol/L (ref 135–145)
Total Bilirubin: 0.4 mg/dL (ref 0.3–1.2)
Total Protein: 7.7 g/dL (ref 6.5–8.1)

## 2017-12-10 LAB — URINALYSIS, ROUTINE W REFLEX MICROSCOPIC
Bacteria, UA: NONE SEEN
Bilirubin Urine: NEGATIVE
GLUCOSE, UA: NEGATIVE mg/dL
Ketones, ur: NEGATIVE mg/dL
Leukocytes, UA: NEGATIVE
Nitrite: NEGATIVE
PROTEIN: NEGATIVE mg/dL
Specific Gravity, Urine: 1.016 (ref 1.005–1.030)
pH: 5 (ref 5.0–8.0)

## 2017-12-10 LAB — TSH: TSH: 0.466 u[IU]/mL (ref 0.350–4.500)

## 2017-12-10 MED ORDER — POTASSIUM CHLORIDE CRYS ER 20 MEQ PO TBCR
40.0000 meq | EXTENDED_RELEASE_TABLET | Freq: Once | ORAL | Status: AC
  Administered 2017-12-10: 40 meq via ORAL
  Filled 2017-12-10: qty 2

## 2017-12-10 MED ORDER — ACETAMINOPHEN 325 MG PO TABS
650.0000 mg | ORAL_TABLET | Freq: Once | ORAL | Status: AC
  Administered 2017-12-10: 650 mg via ORAL
  Filled 2017-12-10: qty 2

## 2017-12-10 MED ORDER — POTASSIUM CHLORIDE CRYS ER 20 MEQ PO TBCR: 40.0000 meq | EXTENDED_RELEASE_TABLET | Freq: Two times a day (BID) | ORAL | 0 refills | Status: DC

## 2017-12-10 NOTE — ED Provider Notes (Signed)
Emergency Department Provider Note   I have reviewed the triage vital signs and the nursing notes.   HISTORY  Chief Complaint Fatigue and Headache   HPI Jenny Evans is a 47 y.o. female with multiple medical problems as documented below the presents to the emergency department today with fatigue.  Patient with progressively worsening fatigue and headache (chronic migraine and tension/cluster headaches) over the last couple weeks.  States that she had anemia in the past and she is on iron but is not sure if that was going on again.  She has been sleeping full night and not waking up rested but her fianc states that she is not snoring or apneic at night.  No recent infections.  No pain anywhere.  No fevers.  No cough, back pain, chest pain, abdominal pain, urinary symptoms. No other associated or modifying symptoms.    Past Medical History:  Diagnosis Date  . Anemia   . Asthma   . Blood transfusion without reported diagnosis   . Chronic pain   . GERD (gastroesophageal reflux disease)   . Migraine   . Sickle cell trait (HCC)   . Ulcer     Patient Active Problem List   Diagnosis Date Noted  . Sickle cell trait (HCC) 06/14/2017  . Symptomatic anemia 06/14/2017  . Blood loss anemia 11/12/2015  . Melena 11/12/2015  . Nonspecific abnormal electrocardiogram (ECG) (EKG) 04/03/2015  . Palpitations 04/03/2015  . Neck pain 04/03/2015  . Colitis 07/10/2014  . Absolute anemia   . Post-operative state 08/26/2013  . Rectocele 08/26/2013  . Anal or rectal pain 07/29/2013  . Headache(784.0) 02/27/2012  . ACUTE BRONCHITIS 03/25/2010  . OBESITY 09/06/2009  . INSOMNIA 01/23/2009  . HYPOKALEMIA 11/01/2008  . DYSPEPSIA&OTHER [facility] DISORDERS FUNCTION STOMACH 06/07/2008  . ALLERGIC RHINITIS 03/23/2008  . CONSTIPATION 01/20/2008  . Shortness of breath 08/27/2007  . TOBACCO ABUSE 03/09/2007  . Migraine 03/09/2007  . ABSCESS, TOOTH 03/09/2007  . GERD (gastroesophageal reflux  disease) 03/09/2007  . Migraine variant 02/25/2007  . ECZEMA 02/25/2007    Past Surgical History:  Procedure Laterality Date  . ESOPHAGOGASTRODUODENOSCOPY (EGD) WITH PROPOFOL N/A 04/04/2013   Procedure: ESOPHAGOGASTRODUODENOSCOPY (EGD) WITH PROPOFOL;  Surgeon: Willis Modena, MD;  [facility];  Service: Endoscopy;  Laterality: N/A;  . EXAMINATION UNDER ANESTHESIA N/A 08/10/2013   Procedure: RECTAL EXAM UNDER ANESTHESIA;  Surgeon: Clovis Pu. Cornett, MD;  Location: [facility];  Service: General;  Laterality: N/A;  ligation  . HEMORRHOID SURGERY    . MYRINGOTOMY     age 79    Current Outpatient Rx  . Order #: [id number] Class: Historical Med  . Order #: [id number] Class: Print    Allergies Hydrocodone-acetaminophen and Penicillins  Family History  Problem Relation Age of Onset  . Heart disease Mother        Arrythmia  . Kidney disease Mother   . Hypertension Mother   . COPD Mother   . Diabetes Mother   . Cancer Mother        lung  . Heart disease Father   . Crohn's disease Maternal Aunt   . Breast cancer Maternal Aunt        54's    Social History Social History   Tobacco Use  . Smoking status: Current Some Day Smoker    Packs/day: 0.25    Years: 15.00    Pack years: 3.75    Types: Cigarettes  . Smokeless tobacco: Never Used  Substance Use Topics  .  Alcohol use: No  . Drug use: No    Review of Systems  All other systems negative except as documented in the HPI. All pertinent positives and negatives as reviewed in the HPI. ____________________________________________   PHYSICAL EXAM:  VITAL SIGNS: ED Triage Vitals  Enc Vitals Group     BP 12/10/17 1522 117/77     Pulse Rate 12/10/17 1522 98     Resp 12/10/17 1522 15     Temp 12/10/17 1522 98.5 F (36.9 C)     Temp Source 12/10/17 1522 Oral     SpO2 12/10/17 1522 100 %     Weight 12/10/17 1521 120 lb (54.4 kg)     Height 12/10/17 1521 5\' 4"  (1.626 m)    Constitutional: Alert  and oriented. Well appearing and in no acute distress. Eyes: Conjunctivae are normal. PERRL. EOMI. Head: Atraumatic. Nose: No congestion/rhinnorhea. Mouth/Throat: Mucous membranes are moist.  Oropharynx non-erythematous. Neck: No stridor.  No meningeal signs.   Cardiovascular: Normal rate, regular rhythm. Good peripheral circulation. Grossly normal heart sounds.   Respiratory: Normal respiratory effort.  No retractions. Lungs CTAB. Gastrointestinal: Soft and nontender. No distention.  Musculoskeletal: No lower extremity tenderness nor edema. No gross deformities of extremities. Neurologic:  Normal speech and language. No gross focal neurologic deficits are appreciated.  Skin:  Skin is warm, dry and intact. No rash noted.  ____________________________________________   LABS (all labs ordered are listed, but only abnormal results are displayed)  Labs Reviewed  CBC WITH DIFFERENTIAL/PLATELET - Abnormal; Notable for the following components:      Result Value   Hemoglobin 9.6 (*)    HCT 29.2 (*)    MCV 72.8 (*)    MCH 23.9 (*)    RDW 17.6 (*)    Platelets 761 (*)    All other components within normal limits  COMPREHENSIVE METABOLIC PANEL - Abnormal; Notable for the following components:   Potassium 3.3 (*)    All other components within normal limits  URINALYSIS, ROUTINE W REFLEX MICROSCOPIC - Abnormal; Notable for the following components:   Hgb urine dipstick MODERATE (*)    All other components within normal limits  TSH   ____________________________________________  EKG   EKG Interpretation  Date/Time:  Thursday December 10 2017 16:25:36 EDT Ventricular Rate:  89 PR Interval:    QRS Duration: 68 QT Interval:  363 QTC Calculation: 442 R Axis:   70 Text Interpretation:  Sinus rhythm Atrial premature complex improved t wave morphology since 2018 Confirmed by Megon Kalina, Barbara CowerJason [phone]) on 12/10/2017 6:20:48 PM        ____________________________________________  RADIOLOGY  No results found.  ____________________________________________   PROCEDURES  Procedure(s) performed:   Procedures   ____________________________________________   INITIAL IMPRESSION / ASSESSMENT AND PLAN / ED COURSE  Basic labs to eval for causes of weakness. Appears well. Will dc if WNL.   Workup unremarkable. Improved symptoms. Stable for dc w/ pcp.   Pertinent labs & imaging results that were available during my care of the patient were reviewed by me and considered in my medical decision making (see chart for details).  ____________________________________________  FINAL CLINICAL IMPRESSION(S) / ED DIAGNOSES  Final diagnoses:  Weakness  Other fatigue  Hypokalemia     MEDICATIONS GIVEN DURING THIS VISIT:  Medications  acetaminophen (TYLENOL) tablet 650 mg (650 mg Oral Given 12/10/17 1643)  potassium chloride SA (K-DUR,KLOR-CON) CR tablet 40 mEq (40 mEq Oral Given 12/10/17 1901)     NEW OUTPATIENT MEDICATIONS STARTED DURING  THIS VISIT:  Discharge Medication List as of 12/10/2017  6:59 PM      Note:  This note was prepared with assistance of Dragon voice recognition software. Occasional wrong-word or sound-a-like substitutions may have occurred due to the inherent limitations of voice recognition software.   Marily Memos, MD 12/10/17 2008

## 2017-12-10 NOTE — ED Notes (Signed)
Pt made aware urine needed  

## 2017-12-10 NOTE — ED Triage Notes (Signed)
Pt reports that she been having fatigue, feels dehydrated for about week. reports that she has chronic anemia. C/o headache at this time.

## 2018-01-18 ENCOUNTER — Emergency Department (HOSPITAL_COMMUNITY)
Admission: EM | Admit: 2018-01-18 | Discharge: 2018-01-18 | Disposition: A | Discharge status: Home / Self Care | Payer: Managed Care, Other (non HMO) | Attending: Emergency Medicine | Admitting: Emergency Medicine

## 2018-01-18 ENCOUNTER — Other Ambulatory Visit: Payer: Self-pay

## 2018-01-18 ENCOUNTER — Encounter (HOSPITAL_COMMUNITY): Payer: Self-pay

## 2018-01-18 DIAGNOSIS — F17210 Nicotine dependence, cigarettes, uncomplicated: Secondary | ICD-10-CM | POA: Insufficient documentation

## 2018-01-18 DIAGNOSIS — D573 Sickle-cell trait: Secondary | ICD-10-CM | POA: Insufficient documentation

## 2018-01-18 DIAGNOSIS — J0120 Acute ethmoidal sinusitis, unspecified: Secondary | ICD-10-CM | POA: Insufficient documentation

## 2018-01-18 DIAGNOSIS — R0981 Nasal congestion: Secondary | ICD-10-CM | POA: Diagnosis present

## 2018-01-18 DIAGNOSIS — J45909 Unspecified asthma, uncomplicated: Secondary | ICD-10-CM | POA: Insufficient documentation

## 2018-01-18 LAB — URINALYSIS, ROUTINE W REFLEX MICROSCOPIC
Bilirubin Urine: NEGATIVE
Glucose, UA: NEGATIVE mg/dL
Ketones, ur: NEGATIVE mg/dL
Leukocytes, UA: NEGATIVE
Nitrite: NEGATIVE
Protein, ur: NEGATIVE mg/dL
Specific Gravity, Urine: 1.015 (ref 1.005–1.030)
pH: 5 (ref 5.0–8.0)

## 2018-01-18 LAB — I-STAT BETA HCG BLOOD, ED (MC, WL, AP ONLY): I-stat hCG, quantitative: <5 m[IU]/mL (ref <5)

## 2018-01-18 MED ORDER — AZITHROMYCIN 250 MG PO TABS: 250.0000 mg | ORAL_TABLET | Freq: Every day | ORAL | 0 refills | Status: DC

## 2018-01-18 MED ORDER — DEXAMETHASONE 4 MG PO TABS
8.0000 mg | ORAL_TABLET | Freq: Once | ORAL | Status: AC
  Administered 2018-01-18: 8 mg via ORAL
  Filled 2018-01-18: qty 2

## 2018-01-18 MED ORDER — FLUTICASONE PROPIONATE 50 MCG/ACT NA SUSP: 2.0000 | Freq: Every day | NASAL | 0 refills | Status: DC | PRN

## 2018-01-18 MED ORDER — IBUPROFEN 200 MG PO TABS
600.0000 mg | ORAL_TABLET | Freq: Once | ORAL | Status: AC
  Administered 2018-01-18: 600 mg via ORAL
  Filled 2018-01-18: qty 3

## 2018-01-18 NOTE — Discharge Instructions (Addendum)
In addition to prescribed medications, I recommend a sinus rinse 1-2 times/day. You can find them in most drug and grocery stores. Take 600 mg of ibuprofen every 6 hours as needed for pain.

## 2018-01-18 NOTE — ED Notes (Signed)
Patient ambulated to the bathroom with no problems.  

## 2018-01-18 NOTE — ED Triage Notes (Addendum)
Pt concerned that they have a sinus infection. Pt states that they have been dealing with it for a while, but got worse last night. Pt states no relief with benadryl. C/o stuffy nose and pressure. Pt also concerned they have a bladder infection, as they have lower back pain bilaterally.

## 2018-01-18 NOTE — ED Provider Notes (Signed)
[facility]-EMERGENCY DEPT Provider Note   CSN: [id number] Arrival date & time: 01/18/18  [id number]     History   Chief Complaint Chief Complaint  Patient presents with  . Nasal Congestion  . Back Pain    HPI Jenny Evans is a 47 y.o. female.  HPI   47 year old female with facial pain and pressure.  Intermittent symptoms for weeks but worse in the last 3 to 4 days.  She describes a lot of pressure around her nose and her eyes.  She feels congested.  Occasional nonproductive cough.  No fevers or chills.  Pain is worse with leaning forward.  Also some mild lower abdominal discomfort.  She is concerned she may potentially have a UTI.  No dysuria.  No fevers or chills.  No unusual vaginal bleeding or discharge.  Past Medical History:  Diagnosis Date  . Anemia   . Asthma   . Blood transfusion without reported diagnosis   . Chronic pain   . GERD (gastroesophageal reflux disease)   . Migraine   . Sickle cell trait (HCC)   . Ulcer     Patient Active Problem List   Diagnosis Date Noted  . Sickle cell trait (HCC) 06/14/2017  . Symptomatic anemia 06/14/2017  . Blood loss anemia 11/12/2015  . Melena 11/12/2015  . Nonspecific abnormal electrocardiogram (ECG) (EKG) 04/03/2015  . Palpitations 04/03/2015  . Neck pain 04/03/2015  . Colitis 07/10/2014  . Absolute anemia   . Post-operative state 08/26/2013  . Rectocele 08/26/2013  . Anal or rectal pain 07/29/2013  . Headache(784.0) 02/27/2012  . ACUTE BRONCHITIS 03/25/2010  . OBESITY 09/06/2009  . INSOMNIA 01/23/2009  . HYPOKALEMIA 11/01/2008  . DYSPEPSIA&OTHER [facility] DISORDERS FUNCTION STOMACH 06/07/2008  . ALLERGIC RHINITIS 03/23/2008  . CONSTIPATION 01/20/2008  . Shortness of breath 08/27/2007  . TOBACCO ABUSE 03/09/2007  . Migraine 03/09/2007  . ABSCESS, TOOTH 03/09/2007  . GERD (gastroesophageal reflux disease) 03/09/2007  . Migraine variant 02/25/2007  . ECZEMA 02/25/2007    Past Surgical History:   Procedure Laterality Date  . ESOPHAGOGASTRODUODENOSCOPY (EGD) WITH PROPOFOL N/A 04/04/2013   Procedure: ESOPHAGOGASTRODUODENOSCOPY (EGD) WITH PROPOFOL;  Surgeon: Willis ModenaWilliam Outlaw, MD;  Location: [facility];  Service: Endoscopy;  Laterality: N/A;  . EXAMINATION UNDER ANESTHESIA N/A 08/10/2013   Procedure: RECTAL EXAM UNDER ANESTHESIA;  Surgeon: Clovis Puhomas A. Cornett, MD;  Location: [facility];  Service: General;  Laterality: N/A;  ligation  . HEMORRHOID SURGERY    . MYRINGOTOMY     age 47     OB History   None      Home Medications    Prior to Admission medications   Medication Sig Start Date End Date Taking? Authorizing Provider  Aspirin-Salicylamide-Caffeine (BC HEADACHE PO) Take 1 packet by mouth 2 (two) times daily as needed (migraine).   Yes [provider]  azithromycin (ZITHROMAX) 250 MG tablet Take 1 tablet (250 mg total) by mouth daily. Take first 2 tablets together, then 1 every day until finished. 01/18/18   Raeford RazorKohut, Feliza Diven, MD  fluticasone [facility](FLONASE) 50 MCG/ACT nasal spray Place 2 sprays into both nostrils daily as needed for allergies or rhinitis. 01/18/18   Raeford RazorKohut, Taneeka Curtner, MD  potassium chloride SA (K-DUR,KLOR-CON) 20 MEQ tablet Take 2 tablets (40 mEq total) by mouth 2 (two) times daily. Patient not taking: Reported on 01/18/2018 12/10/17   Mesner, Barbara CowerJason, MD    Family History Family History  Problem Relation Age of Onset  . Heart disease Mother  Arrythmia  . Kidney disease Mother   . Hypertension Mother   . COPD Mother   . Diabetes Mother   . Cancer Mother        lung  . Heart disease Father   . Crohn's disease Maternal Aunt   . Breast cancer Maternal Aunt        51's    Social History Social History   Tobacco Use  . Smoking status: Current Some Day Smoker    Packs/day: 0.25    Years: 15.00    Pack years: 3.75    Types: Cigarettes  . Smokeless tobacco: Never Used  Substance Use Topics  . Alcohol use: No  . Drug use: No      Allergies   Hydrocodone-acetaminophen and Penicillins   Review of Systems Review of Systems   Physical Exam Updated Vital Signs BP (!) 104/56 (BP Location: Right Arm)   Pulse 97   Temp 97.8 F (36.6 C) (Oral)   Resp 12   Ht 5\' 4"  (1.626 m)   Wt 54.4 kg (120 lb)   SpO2 100%   BMI 20.60 kg/m   Physical Exam  Constitutional: She appears well-developed and well-nourished. No distress.  HENT:  Head: Normocephalic and atraumatic.  Tenderness over the frontal sinuses into the bridge of the nose.  No overlying skin changes.  Boggy nasal mucosa.  Eyes: Conjunctivae are normal. Right eye exhibits no discharge. Left eye exhibits no discharge.  Neck: Neck supple.  Cardiovascular: Normal rate, regular rhythm and normal heart sounds. Exam reveals no gallop and no friction rub.  No murmur heard. Pulmonary/Chest: Effort normal and breath sounds normal. No respiratory distress.  Abdominal: Soft. She exhibits no distension. There is no tenderness.  Musculoskeletal: She exhibits no edema or tenderness.  Neurological: She is alert.  Skin: Skin is warm and dry.  Psychiatric: She has a normal mood and affect. Her behavior is normal. Thought content normal.  Nursing note and vitals reviewed.    ED Treatments / Results  Labs (all labs ordered are listed, but only abnormal results are displayed) Labs Reviewed  URINALYSIS, ROUTINE W REFLEX MICROSCOPIC - Abnormal; Notable for the following components:      Result Value   Hgb urine dipstick MODERATE (*)    Bacteria, UA RARE (*)    All other components within normal limits  I-STAT BETA HCG BLOOD, ED ([facility], [facility], [facility] ONLY)    EKG None  Radiology No results found.  Procedures Procedures (including critical care time)  Medications Ordered in ED Medications  dexamethasone (DECADRON) tablet 8 mg (has no administration in time range)  ibuprofen (ADVIL,MOTRIN) tablet 600 mg (has no administration in time range)     Initial  Impression / Assessment and Plan / ED Course  I have reviewed the triage vital signs and the nursing notes.  Pertinent labs & imaging results that were available during my care of the patient were reviewed by me and considered in my medical decision making (see chart for details).     47 year old female with symptoms consistent with sinusitis.  Advised saline rinses.  Given her duration of symptoms for several weeks will place on antibiotics.  Also some mild vague lower abdominal pain.  Abdominal exam is benign.  UA is not consistent with UTI. Final Clinical Impressions(s) / ED Diagnoses   Final diagnoses:  Subacute ethmoidal sinusitis    ED Discharge Orders        Ordered    fluticasone (FLONASE) 50 MCG/ACT nasal  spray  Daily PRN     01/18/18 0909    azithromycin (ZITHROMAX) 250 MG tablet  Daily     01/18/18 0909       Raeford Razor, MD 01/21/18 1644

## 2018-01-31 ENCOUNTER — Encounter (HOSPITAL_COMMUNITY): Payer: Self-pay | Admitting: Emergency Medicine

## 2018-01-31 ENCOUNTER — Emergency Department (HOSPITAL_COMMUNITY)
Admission: EM | Admit: 2018-01-31 | Discharge: 2018-01-31 | Disposition: A | Discharge status: Home / Self Care | Payer: Managed Care, Other (non HMO) | Attending: Emergency Medicine | Admitting: Emergency Medicine

## 2018-01-31 ENCOUNTER — Other Ambulatory Visit: Payer: Self-pay

## 2018-01-31 DIAGNOSIS — F17210 Nicotine dependence, cigarettes, uncomplicated: Secondary | ICD-10-CM | POA: Insufficient documentation

## 2018-01-31 DIAGNOSIS — J45909 Unspecified asthma, uncomplicated: Secondary | ICD-10-CM | POA: Diagnosis not present

## 2018-01-31 DIAGNOSIS — M62838 Other muscle spasm: Secondary | ICD-10-CM | POA: Diagnosis not present

## 2018-01-31 DIAGNOSIS — M545 Low back pain: Secondary | ICD-10-CM | POA: Diagnosis present

## 2018-01-31 DIAGNOSIS — Z79899 Other long term (current) drug therapy: Secondary | ICD-10-CM | POA: Insufficient documentation

## 2018-01-31 MED ORDER — NAPROXEN 250 MG PO TABS
500.0000 mg | ORAL_TABLET | Freq: Once | ORAL | Status: AC
  Administered 2018-01-31: 500 mg via ORAL
  Filled 2018-01-31: qty 2

## 2018-01-31 MED ORDER — METHOCARBAMOL 500 MG PO TABS: 500.0000 mg | ORAL_TABLET | Freq: Two times a day (BID) | ORAL | 0 refills | Status: DC

## 2018-01-31 MED ORDER — NAPROXEN 500 MG PO TABS: 500.0000 mg | ORAL_TABLET | Freq: Two times a day (BID) | ORAL | 0 refills | Status: DC

## 2018-01-31 MED ORDER — METHOCARBAMOL 500 MG PO TABS
500.0000 mg | ORAL_TABLET | Freq: Once | ORAL | Status: AC
  Administered 2018-01-31: 500 mg via ORAL
  Filled 2018-01-31: qty 1

## 2018-01-31 NOTE — ED Notes (Signed)
Pt verbalized understanding of d/c instructions and has no further questions, VSS, NAD.  

## 2018-01-31 NOTE — ED Provider Notes (Signed)
[facility] EMERGENCY DEPARTMENT Provider Note   CSN: [id number] Arrival date & time: 01/31/18  0800     History   Chief Complaint Chief Complaint  Patient presents with  . Back Pain    HPI Jenny Evans is a 47 y.o. female.  HPI   47 year old female presents today with complaints of left upper back pain.  Patient notes she works in a [employer].  She notes that on Thursday she developed pain in her left upper thoracic region, she notes this pain continue to worsen, worse with movement, worse with palpation, no associated shortness of breath, no lower back pain or neurological deficits.  No medications prior to arrival.  Past Medical History:  Diagnosis Date  . Anemia   . Asthma   . Blood transfusion without reported diagnosis   . Chronic pain   . GERD (gastroesophageal reflux disease)   . Migraine   . Sickle cell trait (HCC)   . Ulcer     Patient Active Problem List   Diagnosis Date Noted  . Sickle cell trait (HCC) 06/14/2017  . Symptomatic anemia 06/14/2017  . Blood loss anemia 11/12/2015  . Melena 11/12/2015  . Nonspecific abnormal electrocardiogram (ECG) (EKG) 04/03/2015  . Palpitations 04/03/2015  . Neck pain 04/03/2015  . Colitis 07/10/2014  . Absolute anemia   . Post-operative state 08/26/2013  . Rectocele 08/26/2013  . Anal or rectal pain 07/29/2013  . Headache(784.0) 02/27/2012  . ACUTE BRONCHITIS 03/25/2010  . OBESITY 09/06/2009  . INSOMNIA 01/23/2009  . HYPOKALEMIA 11/01/2008  . DYSPEPSIA&OTHER [facility] DISORDERS FUNCTION STOMACH 06/07/2008  . ALLERGIC RHINITIS 03/23/2008  . CONSTIPATION 01/20/2008  . Shortness of breath 08/27/2007  . TOBACCO ABUSE 03/09/2007  . Migraine 03/09/2007  . ABSCESS, TOOTH 03/09/2007  . GERD (gastroesophageal reflux disease) 03/09/2007  . Migraine variant 02/25/2007  . ECZEMA 02/25/2007    Past Surgical History:  Procedure Laterality Date  . ESOPHAGOGASTRODUODENOSCOPY (EGD)  WITH PROPOFOL N/A 04/04/2013   Procedure: ESOPHAGOGASTRODUODENOSCOPY (EGD) WITH PROPOFOL;  Surgeon: Willis Modena, MD;  Location: [facility];  Service: Endoscopy;  Laterality: N/A;  . EXAMINATION UNDER ANESTHESIA N/A 08/10/2013   Procedure: RECTAL EXAM UNDER ANESTHESIA;  Surgeon: Clovis Pu. Cornett, MD;  Location: [facility];  Service: General;  Laterality: N/A;  ligation  . HEMORRHOID SURGERY    . MYRINGOTOMY     age 51     OB History   None      Home Medications    Prior to Admission medications   Medication Sig Start Date End Date Taking? Authorizing Provider  Aspirin-Salicylamide-Caffeine (BC HEADACHE PO) Take 1 packet by mouth 2 (two) times daily as needed (migraine).    [provider]  azithromycin (ZITHROMAX) 250 MG tablet Take 1 tablet (250 mg total) by mouth daily. Take first 2 tablets together, then 1 every day until finished. 01/18/18   Raeford Razor, MD  fluticasone [facility]) 50 MCG/ACT nasal spray Place 2 sprays into both nostrils daily as needed for allergies or rhinitis. 01/18/18   Raeford Razor, MD  methocarbamol (ROBAXIN) 500 MG tablet Take 1 tablet (500 mg total) by mouth 2 (two) times daily. 01/31/18   Fines Kimberlin, Tinnie Gens, PA-C  naproxen (NAPROSYN) 500 MG tablet Take 1 tablet (500 mg total) by mouth 2 (two) times daily. 01/31/18   [employer], Tinnie Gens, PA-C  potassium chloride SA (K-DUR,KLOR-CON) 20 MEQ tablet Take 2 tablets (40 mEq total) by mouth 2 (two) times daily. Patient not taking: Reported on  01/18/2018 12/10/17   Mesner, Barbara CowerJason, MD    Family History Family History  Problem Relation Age of Onset  . Heart disease Mother        Arrythmia  . Kidney disease Mother   . Hypertension Mother   . COPD Mother   . Diabetes Mother   . Cancer Mother        lung  . Heart disease Father   . Crohn's disease Maternal Aunt   . Breast cancer Maternal Aunt        1340's    Social History Social History   Tobacco Use  . Smoking status: Current Some Day  Smoker    Packs/day: 0.25    Years: 15.00    Pack years: 3.75    Types: Cigarettes  . Smokeless tobacco: Never Used  Substance Use Topics  . Alcohol use: No  . Drug use: No     Allergies   Hydrocodone-acetaminophen and Penicillins   Review of Systems Review of Systems  All other systems reviewed and are negative.    Physical Exam Updated Vital Signs BP (!) 102/58 (BP Location: Right Arm)   Pulse 87   Temp 98.1 F (36.7 C) (Oral)   Resp 12   Ht 5\' 4"  (1.626 m)   Wt 56.7 kg   LMP 01/17/2018   SpO2 99%   BMI 21.46 kg/m   Physical Exam  Constitutional: She is oriented to person, place, and time. She appears well-developed and well-nourished.  HENT:  Head: Normocephalic and atraumatic.  Eyes: Pupils are equal, round, and reactive to light. Conjunctivae are normal. Right eye exhibits no discharge. Left eye exhibits no discharge. No scleral icterus.  Neck: Normal range of motion. No JVD present. No tracheal deviation present.  Pulmonary/Chest: Effort normal. No stridor.  Musculoskeletal:  Tenderness palpation of the left lateral thoracic musculature medial to the scapula, no midline tenderness to the back-bilateral upper and lower extremity sensation strength and motor function intact  Neurological: She is alert and oriented to person, place, and time. Coordination normal.  Psychiatric: She has a normal mood and affect. Her behavior is normal. Judgment and thought content normal.  Nursing note and vitals reviewed.    ED Treatments / Results  Labs (all labs ordered are listed, but only abnormal results are displayed) Labs Reviewed - No data to display  EKG None  Radiology No results found.  Procedures Procedures (including critical care time)  Medications Ordered in ED Medications  naproxen (NAPROSYN) tablet 500 mg (has no administration in time range)  methocarbamol (ROBAXIN) tablet 500 mg (has no administration in time range)     Initial Impression /  Assessment and Plan / ED Course  I have reviewed the triage vital signs and the nursing notes.  Pertinent labs & imaging results that were available during my care of the patient were reviewed by me and considered in my medical decision making (see chart for details).     47 year old female presents today with likely muscular pain.  She is well-appearing, she will be treated with anti-inflammatories and muscle relaxers.  She is given strict return precautions, she verbalized understanding and agreement to today's plan had no further questions or concerns at time discharge.  Final Clinical Impressions(s) / ED Diagnoses   Final diagnoses:  Muscle spasm    ED Discharge Orders         Ordered    methocarbamol (ROBAXIN) 500 MG tablet  2 times daily     01/31/18 1028  naproxen (NAPROSYN) 500 MG tablet  2 times daily     01/31/18 1028           Eyvonne MechanicHedges, Maleyah Evans, PA-C 01/31/18 1029    Terrilee FilesButler, Michael C, MD 01/31/18 [phone]

## 2018-01-31 NOTE — Discharge Instructions (Signed)
Please read attached information. If you experience any new or worsening signs or symptoms please return to the emergency room for evaluation. Please follow-up with your primary care provider or specialist as discussed. Please use medication prescribed only as directed and discontinue taking if you have any concerning signs or symptoms.   °

## 2018-01-31 NOTE — ED Triage Notes (Signed)
Pt. Stated, I think I pulled a muscle in my back on Thursday, its in the middle

## 2018-01-31 NOTE — ED Notes (Addendum)
Pt endorses left sided upper back pain x 3 days. Thinks she pulled a muscle.

## 2018-02-23 ENCOUNTER — Ambulatory Visit (INDEPENDENT_AMBULATORY_CARE_PROVIDER_SITE_OTHER): Payer: No Typology Code available for payment source | Admitting: Physician Assistant

## 2018-04-08 ENCOUNTER — Emergency Department (HOSPITAL_COMMUNITY): Payer: Self-pay

## 2018-04-08 ENCOUNTER — Encounter (HOSPITAL_COMMUNITY): Payer: Self-pay | Admitting: Emergency Medicine

## 2018-04-08 ENCOUNTER — Emergency Department (HOSPITAL_COMMUNITY)
Admission: EM | Admit: 2018-04-08 | Discharge: 2018-04-08 | Disposition: A | Discharge status: Home / Self Care | Payer: Self-pay | Attending: Emergency Medicine | Admitting: Emergency Medicine

## 2018-04-08 ENCOUNTER — Other Ambulatory Visit: Payer: Self-pay

## 2018-04-08 DIAGNOSIS — R059 Cough, unspecified: Secondary | ICD-10-CM

## 2018-04-08 DIAGNOSIS — J45909 Unspecified asthma, uncomplicated: Secondary | ICD-10-CM | POA: Insufficient documentation

## 2018-04-08 DIAGNOSIS — F17210 Nicotine dependence, cigarettes, uncomplicated: Secondary | ICD-10-CM | POA: Insufficient documentation

## 2018-04-08 DIAGNOSIS — R000 Tachycardia, unspecified: Secondary | ICD-10-CM | POA: Insufficient documentation

## 2018-04-08 DIAGNOSIS — R05 Cough: Secondary | ICD-10-CM | POA: Insufficient documentation

## 2018-04-08 DIAGNOSIS — R0789 Other chest pain: Secondary | ICD-10-CM | POA: Insufficient documentation

## 2018-04-08 LAB — COMPREHENSIVE METABOLIC PANEL
ALK PHOS: 56 U/L (ref 38–126)
ALT: 17 U/L (ref 0–44)
AST: 22 U/L (ref 15–41)
Albumin: 3.9 g/dL (ref 3.5–5.0)
Anion gap: 8 (ref 5–15)
BUN: 10 mg/dL (ref 6–20)
CALCIUM: 9.2 mg/dL (ref 8.9–10.3)
CO2: 23 mmol/L (ref 22–32)
CREATININE: 0.56 mg/dL (ref 0.44–1.00)
Chloride: 105 mmol/L (ref 98–111)
Glucose, Bld: 91 mg/dL (ref 70–99)
Potassium: 3.2 mmol/L — ABNORMAL LOW (ref 3.5–5.1)
Sodium: 136 mmol/L (ref 135–145)
Total Bilirubin: 0.4 mg/dL (ref 0.3–1.2)
Total Protein: 6.5 g/dL (ref 6.5–8.1)

## 2018-04-08 LAB — URINALYSIS, ROUTINE W REFLEX MICROSCOPIC
BILIRUBIN URINE: NEGATIVE
Bacteria, UA: NONE SEEN
GLUCOSE, UA: NEGATIVE mg/dL
Ketones, ur: 5 mg/dL — AB
Leukocytes, UA: NEGATIVE
NITRITE: NEGATIVE
PH: 7 (ref 5.0–8.0)
Protein, ur: NEGATIVE mg/dL
SPECIFIC GRAVITY, URINE: 1.029 (ref 1.005–1.030)

## 2018-04-08 LAB — D-DIMER, QUANTITATIVE: D-Dimer, Quant: 0.61 ug/mL-FEU — ABNORMAL HIGH (ref 0.00–0.50)

## 2018-04-08 LAB — CBC WITH DIFFERENTIAL/PLATELET
Abs Immature Granulocytes: 0.01 10*3/uL (ref 0.00–0.07)
BASOS ABS: 0 10*3/uL (ref 0.0–0.1)
Basophils Relative: 1 %
EOS ABS: 0 10*3/uL (ref 0.0–0.5)
Eosinophils Relative: 0 %
HCT: 29 % — ABNORMAL LOW (ref 36.0–46.0)
HEMOGLOBIN: 8.6 g/dL — AB (ref 12.0–15.0)
Immature Granulocytes: 0 %
LYMPHS ABS: 1.5 10*3/uL (ref 0.7–4.0)
Lymphocytes Relative: 29 %
MCH: 21.2 pg — ABNORMAL LOW (ref 26.0–34.0)
MCHC: 29.7 g/dL — AB (ref 30.0–36.0)
MCV: 71.6 fL — ABNORMAL LOW (ref 80.0–100.0)
Monocytes Absolute: 0.4 10*3/uL (ref 0.1–1.0)
Monocytes Relative: 9 %
Neutro Abs: 3.1 10*3/uL (ref 1.7–7.7)
Neutrophils Relative %: 61 %
Platelets: 554 10*3/uL — ABNORMAL HIGH (ref 150–400)
RBC: 4.05 MIL/uL (ref 3.87–5.11)
RDW: 22.6 % — AB (ref 11.5–15.5)
WBC: 5.1 10*3/uL (ref 4.0–10.5)
nRBC: 0 % (ref 0.0–0.2)

## 2018-04-08 LAB — I-STAT BETA HCG BLOOD, ED (MC, WL, AP ONLY): I-stat hCG, quantitative: <5 m[IU]/mL (ref <5)

## 2018-04-08 LAB — I-STAT CG4 LACTIC ACID, ED: Lactic Acid, Venous: 1.16 mmol/L (ref 0.5–1.9)

## 2018-04-08 MED ORDER — IPRATROPIUM-ALBUTEROL 0.5-2.5 (3) MG/3ML IN SOLN
3.0000 mL | Freq: Once | RESPIRATORY_TRACT | Status: AC
  Administered 2018-04-08: 3 mL via RESPIRATORY_TRACT
  Filled 2018-04-08: qty 3

## 2018-04-08 MED ORDER — ALBUTEROL SULFATE HFA 108 (90 BASE) MCG/ACT IN AERS: 1.0000 | INHALATION_SPRAY | Freq: Four times a day (QID) | RESPIRATORY_TRACT | 0 refills | Status: DC | PRN

## 2018-04-08 MED ORDER — IOPAMIDOL (ISOVUE-370) INJECTION 76%
INTRAVENOUS | Status: AC
  Administered 2018-04-08: 100 mL
  Filled 2018-04-08: qty 100

## 2018-04-08 MED ORDER — SODIUM CHLORIDE 0.9 % IV BOLUS
1000.0000 mL | Freq: Once | INTRAVENOUS | Status: AC
  Administered 2018-04-08: 1000 mL via INTRAVENOUS

## 2018-04-08 MED ORDER — PREDNISONE 20 MG PO TABS: 40.0000 mg | ORAL_TABLET | Freq: Every day | ORAL | 0 refills | Status: AC

## 2018-04-08 NOTE — ED Notes (Signed)
Pt states that she is ready to leave and wants her discharge paperwork; pt encouraged to stay and wait for results, but is still insistent on leaving; MD notified

## 2018-04-08 NOTE — ED Triage Notes (Signed)
Cold symptoms started 1 week ago - now has dry, nonproductive cough, sore throat, fever at night- taking oTC meds without relief.

## 2018-04-08 NOTE — Discharge Instructions (Signed)

## 2018-04-08 NOTE — ED Provider Notes (Signed)
Emergency Department Provider Note   I have reviewed the triage vital signs and the nursing notes.   HISTORY  Chief Complaint flu like symptoms and Generalized Body Aches   HPI Jenny Evans is a 47 y.o. female with PMH of anemia, asthma, GERD, and chronic pain presents to the ED with cough, fatigue, muscle aches, and posterior upper back pain. Symptoms have been ongoing for the last 10 days. Reports subjective fever at night. No productive cough. Patient does smoke cigarettes daily for many years. Back pain is worse with coughing and deep breathing. No abdominal pain, vomiting, or diarrhea.   Past Medical History:  Diagnosis Date  . Anemia   . Asthma   . Blood transfusion without reported diagnosis   . Chronic pain   . GERD (gastroesophageal reflux disease)   . Migraine   . Sickle cell trait (HCC)   . Ulcer     Patient Active Problem List   Diagnosis Date Noted  . Sickle cell trait (HCC) 06/14/2017  . Symptomatic anemia 06/14/2017  . Blood loss anemia 11/12/2015  . Melena 11/12/2015  . Nonspecific abnormal electrocardiogram (ECG) (EKG) 04/03/2015  . Palpitations 04/03/2015  . Neck pain 04/03/2015  . Colitis 07/10/2014  . Absolute anemia   . Post-operative state 08/26/2013  . Rectocele 08/26/2013  . Anal or rectal pain 07/29/2013  . Headache(784.0) 02/27/2012  . ACUTE BRONCHITIS 03/25/2010  . OBESITY 09/06/2009  . INSOMNIA 01/23/2009  . HYPOKALEMIA 11/01/2008  . DYSPEPSIA&OTHER [facility] DISORDERS FUNCTION STOMACH 06/07/2008  . ALLERGIC RHINITIS 03/23/2008  . CONSTIPATION 01/20/2008  . Shortness of breath 08/27/2007  . TOBACCO ABUSE 03/09/2007  . Migraine 03/09/2007  . ABSCESS, TOOTH 03/09/2007  . GERD (gastroesophageal reflux disease) 03/09/2007  . Migraine variant 02/25/2007  . ECZEMA 02/25/2007    Past Surgical History:  Procedure Laterality Date  . ESOPHAGOGASTRODUODENOSCOPY (EGD) WITH PROPOFOL N/A 04/04/2013   Procedure: ESOPHAGOGASTRODUODENOSCOPY  (EGD) WITH PROPOFOL;  Surgeon: Willis Modena, MD;  Location: [facility];  Service: Endoscopy;  Laterality: N/A;  . EXAMINATION UNDER ANESTHESIA N/A 08/10/2013   Procedure: RECTAL EXAM UNDER ANESTHESIA;  Surgeon: Clovis Pu. Cornett, MD;  Location: [facility];  Service: General;  Laterality: N/A;  ligation  . HEMORRHOID SURGERY    . MYRINGOTOMY     age 49    Allergies Hydrocodone-acetaminophen and Penicillins  Family History  Problem Relation Age of Onset  . Heart disease Mother        Arrythmia  . Kidney disease Mother   . Hypertension Mother   . COPD Mother   . Diabetes Mother   . Cancer Mother        lung  . Heart disease Father   . Crohn's disease Maternal Aunt   . Breast cancer Maternal Aunt        63's    Social History Social History   Tobacco Use  . Smoking status: Current Some Day Smoker    Packs/day: 0.25    Years: 15.00    Pack years: 3.75    Types: Cigarettes  . Smokeless tobacco: Never Used  Substance Use Topics  . Alcohol use: No  . Drug use: No    Review of Systems  Constitutional: Positive subjective fever. Positive fatigue and body aches.  Eyes: No visual changes. ENT: No sore throat. Cardiovascular: Positive posterior chest/back pain. Respiratory: Positive shortness of breath. Positive cough.  Gastrointestinal: No abdominal pain. No nausea, no vomiting. No diarrhea. No constipation. Genitourinary: Negative for dysuria. Musculoskeletal:  Negative for back pain. Skin: Negative for rash. Neurological: Negative for headaches, focal weakness or numbness.  10-point ROS otherwise negative.  ____________________________________________   PHYSICAL EXAM:  VITAL SIGNS: ED Triage Vitals  Enc Vitals Group     BP 04/08/18 0820 97/72     Pulse Rate 04/08/18 0820 (!) 120     Resp 04/08/18 0820 20     Temp 04/08/18 0820 98.8 F (37.1 C)     Temp Source 04/08/18 0820 Oral     SpO2 04/08/18 0820 99 %     Weight 04/08/18 0823 125 lb  (56.7 kg)     Height 04/08/18 0823 5\' 4"  (1.626 m)     Pain Score 04/08/18 0822 6   Constitutional: Alert and oriented. Well appearing and in no acute distress. Eyes: Conjunctivae are normal.  Head: Atraumatic. Nose: No congestion/rhinnorhea. Mouth/Throat: Mucous membranes are moist.  Oropharynx non-erythematous. Neck: No stridor.   Cardiovascular: Tachycardia. Good peripheral circulation. Grossly normal heart sounds.   Respiratory: Normal respiratory effort.  No retractions. Lungs with faint wheezing on exam.  Gastrointestinal: Soft and nontender. No distention.  Musculoskeletal: No lower extremity tenderness nor edema. No gross deformities of extremities. Neurologic:  Normal speech and language. No gross focal neurologic deficits are appreciated.  Skin:  Skin is warm, dry and intact. No rash noted.  ____________________________________________   LABS (all labs ordered are listed, but only abnormal results are displayed)  Labs Reviewed  COMPREHENSIVE METABOLIC PANEL - Abnormal; Notable for the following components:      Result Value   Potassium 3.2 (*)    All other components within normal limits  CBC WITH DIFFERENTIAL/PLATELET - Abnormal; Notable for the following components:   Hemoglobin 8.6 (*)    HCT 29.0 (*)    MCV 71.6 (*)    MCH 21.2 (*)    MCHC 29.7 (*)    RDW 22.6 (*)    Platelets 554 (*)    All other components within normal limits  URINALYSIS, ROUTINE W REFLEX MICROSCOPIC - Abnormal; Notable for the following components:   Color, Urine STRAW (*)    Hgb urine dipstick SMALL (*)    Ketones, ur 5 (*)    All other components within normal limits  D-DIMER, QUANTITATIVE (NOT AT [facility]) - Abnormal; Notable for the following components:   D-Dimer, Quant 0.61 (*)    All other components within normal limits  I-STAT CG4 LACTIC ACID, ED  I-STAT BETA HCG BLOOD, ED ([facility], [facility], [facility] ONLY)   ____________________________________________  EKG   EKG  Interpretation  Date/Time:  Thursday April 08 2018 09:21:39 EDT Ventricular Rate:  84 PR Interval:    QRS Duration: 71 QT Interval:  373 QTC Calculation: 441 R Axis:   85 Text Interpretation:  Sinus rhythm No STEMI.  Confirmed by Alona Bene [phone]) on 04/08/2018 9:25:09 AM       ____________________________________________  RADIOLOGY  Dg Chest 2 View  Result Date: 04/08/2018 CLINICAL DATA:  Cough and shortness of breath. Body aches and fever over the last week. EXAM: CHEST - 2 VIEW COMPARISON:  06/14/2017 FINDINGS: Narrow AP diameter of the chest. Heart size is normal. Mediastinal shadows are normal. The lungs are clear. No bronchial thickening. No infiltrate, mass, effusion or collapse. Pulmonary vascularity is normal. No bony abnormality. IMPRESSION: Normal chest Electronically Signed   By: Paulina Fusi M.D.   On: 04/08/2018 09:00   Ct Angio Chest Pe W And/or Wo Contrast  Result Date: 04/08/2018 CLINICAL DATA:  Chest  pain and shortness of breath. EXAM: CT ANGIOGRAPHY CHEST WITH CONTRAST TECHNIQUE: Multidetector CT imaging of the chest was performed using the standard protocol during bolus administration of intravenous contrast. Multiplanar CT image reconstructions and MIPs were obtained to evaluate the vascular anatomy. CONTRAST:  ISOVUE-370 IOPAMIDOL (ISOVUE-370) INJECTION 76% COMPARISON:  Chest radiograph April 08, 2018 FINDINGS: Cardiovascular: There is no demonstrable pulmonary embolus. There is no thoracic aortic aneurysm or dissection. No pericardial effusion or pericardial thickening evident. Mediastinum/Nodes: Visualized thyroid appears normal. There is no appreciable thoracic adenopathy. No esophageal lesions are evident. Lungs/Pleura: There is no edema or consolidation. No pleural effusion or pleural thickening evident. Upper Abdomen: There is incomplete visualization of a 1 x 1 cm apparent adrenal adenoma on the left. Visualized upper abdominal structures otherwise  appear unremarkable. Musculoskeletal: There are no blastic or lytic bone lesions. No evident chest wall lesions. Review of the MIP images confirms the above findings. IMPRESSION: 1. No demonstrable pulmonary embolus. No thoracic aortic aneurysm or dissection. 2.  Lungs clear. 3.  No evident thoracic adenopathy. 4. Incomplete visualization of small left adrenal adenoma, a benign finding. Electronically Signed   By: Bretta Bang III M.D.   On: 04/08/2018 14:04    ____________________________________________   PROCEDURES  Procedure(s) performed:   Procedures  None ____________________________________________   INITIAL IMPRESSION / ASSESSMENT AND PLAN / ED COURSE  Pertinent labs & imaging results that were available during my care of the patient were reviewed by me and considered in my medical decision making (see chart for details).  Patient with PMH of tobacco use and asthma presents to the ED with dry cough and posterior chest wall pain. Likely viral infection. No acute distress. Patient with tachycardia and posterior back pain. Will obtain D-dimer as cannot PERC with tachycardia but low pre-test prob of PE. Plan for neb here. I did counsel the patient on the importance of smoking cessation. She is currently in the pre-contemplative stage.   Differential includes all life-threatening causes for chest pain. This includes but is not exclusive to acute coronary syndrome, aortic dissection, pulmonary embolism, cardiac tamponade, community-acquired pneumonia, pericarditis, musculoskeletal chest wall pain, etc.  11:45 AM D-dimer slightly elevated. Given symptoms plan for CTA of the chest. Remaining labs are unremarkable.   CTA negative. Plan for steroid course and albuterol at home with PCP follow up.  At this time, I do not feel there is any life-threatening condition present. I have reviewed and discussed all results (EKG, imaging, lab, urine as appropriate), exam findings with patient.  I have reviewed nursing notes and appropriate previous records.  I feel the patient is safe to be discharged home without further emergent workup. Discussed usual and customary return precautions. Patient and family (if present) verbalize understanding and are comfortable with this plan.  Patient will follow-up with their primary care provider. If they do not have a primary care provider, information for follow-up has been provided to them. All questions have been answered.  ____________________________________________  FINAL CLINICAL IMPRESSION(S) / ED DIAGNOSES  Final diagnoses:  Posterior chest pain  Cough  Tachycardia     MEDICATIONS GIVEN DURING THIS VISIT:  Medications  sodium chloride 0.9 % bolus 1,000 mL (0 mLs Intravenous Stopped 04/08/18 1035)  ipratropium-albuterol (DUONEB) 0.5-2.5 (3) MG/3ML nebulizer solution 3 mL (3 mLs Nebulization Given 04/08/18 0922)  iopamidol (ISOVUE-370) 76 % injection (100 mLs  Contrast Given 04/08/18 1214)     NEW OUTPATIENT MEDICATIONS STARTED DURING THIS VISIT:  Discharge Medication List as of  04/08/2018  2:07 PM    START taking these medications   Details  albuterol (PROVENTIL HFA;VENTOLIN HFA) 108 (90 Base) MCG/ACT inhaler Inhale 1-2 puffs into the lungs every 6 (six) hours as needed for wheezing or shortness of breath., Starting Thu 04/08/2018, Print    predniSONE (DELTASONE) 20 MG tablet Take 2 tablets (40 mg total) by mouth daily for 5 days., Starting Thu 04/08/2018, Until Tue 04/13/2018, Print        Note:  This document was prepared using Dragon voice recognition software and may include unintentional dictation errors.  Alona Bene, MD Emergency Medicine    Long, [name], MD 04/08/18 [phone]

## 2018-04-08 NOTE — ED Notes (Signed)
Patient verbalizes understanding of discharge instructions. Opportunity for questioning and answers were provided. Pt discharged from ED. 

## 2018-04-30 ENCOUNTER — Ambulatory Visit: Payer: No Typology Code available for payment source | Admitting: Family Medicine

## 2018-05-21 ENCOUNTER — Encounter: Payer: Self-pay | Admitting: Family Medicine

## 2018-05-21 ENCOUNTER — Ambulatory Visit: Payer: No Typology Code available for payment source | Admitting: Family Medicine

## 2018-05-21 DIAGNOSIS — Z0289 Encounter for other administrative examinations: Secondary | ICD-10-CM

## 2018-08-21 ENCOUNTER — Encounter (HOSPITAL_COMMUNITY): Payer: Self-pay

## 2018-08-21 ENCOUNTER — Other Ambulatory Visit: Payer: Self-pay

## 2018-08-21 ENCOUNTER — Emergency Department (HOSPITAL_COMMUNITY)
Admission: EM | Admit: 2018-08-21 | Discharge: 2018-08-21 | Disposition: A | Discharge status: Home / Self Care | Payer: No Typology Code available for payment source | Attending: Emergency Medicine | Admitting: Emergency Medicine

## 2018-08-21 DIAGNOSIS — J45909 Unspecified asthma, uncomplicated: Secondary | ICD-10-CM | POA: Insufficient documentation

## 2018-08-21 DIAGNOSIS — F17210 Nicotine dependence, cigarettes, uncomplicated: Secondary | ICD-10-CM | POA: Insufficient documentation

## 2018-08-21 DIAGNOSIS — Z79899 Other long term (current) drug therapy: Secondary | ICD-10-CM | POA: Insufficient documentation

## 2018-08-21 DIAGNOSIS — M25522 Pain in left elbow: Secondary | ICD-10-CM

## 2018-08-21 DIAGNOSIS — J0100 Acute maxillary sinusitis, unspecified: Secondary | ICD-10-CM | POA: Insufficient documentation

## 2018-08-21 MED ORDER — AMOXICILLIN-POT CLAVULANATE 875-125 MG PO TABS: 1.0000 | ORAL_TABLET | Freq: Two times a day (BID) | ORAL | 0 refills | Status: AC

## 2018-08-21 NOTE — ED Notes (Signed)
ED Provider at bedside. 

## 2018-08-21 NOTE — ED Triage Notes (Signed)
Patient c/o left elbow pain x 1 month. 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 patient also c/o nasal congestion and sinus pressure x 2 weeks.

## 2018-08-21 NOTE — Discharge Instructions (Signed)
Your history of 2 weeks of severe sinus pressure, pain, and congestion symptoms are consistent with a sinusitis.  Please take the antibiotics to treat this.  Your chart says you have tolerated the antibiotics in the past and you confirm this as we discussed.  Please take it for the next 2 weeks.  Please follow-up with orthopedics for the left elbow pain which I suspect is due to the chronic overuse we discussed.  You may get an over-the-counter brace to help as well as use ice and anti-inflammatory medications.  Please follow-up with orthopedics for further management if this does not improve the symptoms.  Your exam was otherwise unremarkable we feel you are safe for discharge home.  Please rest and stay hydrated.  If any symptoms change or worsen, please return to the nearest emergency department.

## 2018-08-21 NOTE — ED Provider Notes (Signed)
[facility]-EMERGENCY DEPT Provider Note   CSN: [id number] Arrival date & time: 08/21/18  0857    History   Chief Complaint Chief Complaint  Patient presents with  . Nasal Congestion  . Elbow Pain    HPI Jenny Evans is a 48 y.o. female.     The history is provided by the patient and medical records. No language interpreter was used.  URI  Presenting symptoms: congestion, facial pain and rhinorrhea   Presenting symptoms: no cough, no ear pain, no fatigue, no fever and no sore throat   Severity:  Moderate Onset quality:  Gradual Duration:  2 weeks Timing:  Constant Progression:  Unchanged Chronicity:  New Relieved by:  Nothing Worsened by:  Nothing Ineffective treatments:  None tried Associated symptoms: sinus pain   Associated symptoms: no headaches, no neck pain, no sneezing and no wheezing     Past Medical History:  Diagnosis Date  . Anemia   . Asthma   . Blood transfusion without reported diagnosis   . Chronic pain   . GERD (gastroesophageal reflux disease)   . Migraine   . Sickle cell trait (HCC)   . Ulcer     Patient Active Problem List   Diagnosis Date Noted  . Sickle cell trait (HCC) 06/14/2017  . Symptomatic anemia 06/14/2017  . Blood loss anemia 11/12/2015  . Melena 11/12/2015  . Nonspecific abnormal electrocardiogram (ECG) (EKG) 04/03/2015  . Palpitations 04/03/2015  . Neck pain 04/03/2015  . Colitis 07/10/2014  . Absolute anemia   . Post-operative state 08/26/2013  . Rectocele 08/26/2013  . Anal or rectal pain 07/29/2013  . Headache(784.0) 02/27/2012  . ACUTE BRONCHITIS 03/25/2010  . OBESITY 09/06/2009  . INSOMNIA 01/23/2009  . HYPOKALEMIA 11/01/2008  . [facility] 06/07/2008  . ALLERGIC RHINITIS 03/23/2008  . CONSTIPATION 01/20/2008  . Shortness of breath 08/27/2007  . TOBACCO ABUSE 03/09/2007  . Migraine 03/09/2007  . ABSCESS, TOOTH 03/09/2007  . GERD (gastroesophageal  reflux disease) 03/09/2007  . Migraine variant 02/25/2007  . ECZEMA 02/25/2007    Past Surgical History:  Procedure Laterality Date  . ESOPHAGOGASTRODUODENOSCOPY (EGD) WITH PROPOFOL N/A 04/04/2013   Procedure: ESOPHAGOGASTRODUODENOSCOPY (EGD) WITH PROPOFOL;  Surgeon: Willis ModenaWilliam Outlaw, MD;  Location: [facility];  Service: Endoscopy;  Laterality: N/A;  . EXAMINATION UNDER ANESTHESIA N/A 08/10/2013   Procedure: RECTAL EXAM UNDER ANESTHESIA;  Surgeon: Clovis Puhomas A. Cornett, MD;  Location: [facility];  Service: General;  Laterality: N/A;  ligation  . HEMORRHOID SURGERY    . MYRINGOTOMY     age 48     OB History   No obstetric history on file.      Home Medications    Prior to Admission medications   Medication Sig Start Date End Date Taking? Authorizing Provider  albuterol (PROVENTIL HFA;VENTOLIN HFA) 108 (90 Base) MCG/ACT inhaler Inhale 1-2 puffs into the lungs every 6 (six) hours as needed for wheezing or shortness of breath. 04/08/18   Long, Arlyss RepressJoshua G, MD  Aspirin-Salicylamide-Caffeine (BC HEADACHE PO) Take 1 packet by mouth 2 (two) times daily as needed (migraine).    [provider]  fluticasone (FLONASE) 50 MCG/ACT nasal spray Place 2 sprays into both nostrils daily as needed for allergies or rhinitis. Patient not taking: Reported on 04/08/2018 01/18/18   Raeford RazorKohut, Stephen, MD  methocarbamol (ROBAXIN) 500 MG tablet Take 1 tablet (500 mg total) by mouth 2 (two) times daily. Patient not taking: Reported on 04/08/2018 01/31/18   Hedges,  Tinnie Gens, PA-C  naproxen (NAPROSYN) 500 MG tablet Take 1 tablet (500 mg total) by mouth 2 (two) times daily. Patient not taking: Reported on 04/08/2018 01/31/18   Hedges, Tinnie Gens, PA-C  potassium chloride SA (K-DUR,KLOR-CON) 20 MEQ tablet Take 2 tablets (40 mEq total) by mouth 2 (two) times daily. Patient not taking: Reported on 01/18/2018 12/10/17   Mesner, Barbara Cower, MD    Family History Family History  Problem Relation Age of Onset  .  Heart disease Mother        Arrythmia  . Kidney disease Mother   . Hypertension Mother   . COPD Mother   . Diabetes Mother   . Lung cancer Mother   . Heart disease Father   . Crohn's disease Maternal Aunt   . Breast cancer Maternal Aunt        60's    Social History Social History   Tobacco Use  . Smoking status: Current Some Day Smoker    Packs/day: 0.25    Years: 15.00    Pack years: 3.75    Types: Cigarettes  . Smokeless tobacco: Never Used  Substance Use Topics  . Alcohol use: No  . Drug use: No     Allergies   Hydrocodone-acetaminophen and Penicillins   Review of Systems Review of Systems  Constitutional: Negative for chills, diaphoresis, fatigue and fever.  HENT: Positive for congestion, rhinorrhea, sinus pressure and sinus pain. Negative for ear pain, facial swelling, sneezing, sore throat, tinnitus, trouble swallowing and voice change.   Eyes: Negative for visual disturbance.  Respiratory: Negative for cough, chest tightness, shortness of breath and wheezing.   Cardiovascular: Negative for chest pain and palpitations.  Gastrointestinal: Negative for abdominal pain.  Genitourinary: Negative for dysuria, flank pain and frequency.  Musculoskeletal: Negative for back pain, neck pain and neck stiffness.  Skin: Negative for rash and wound.  Neurological: Negative for dizziness, facial asymmetry, weakness, light-headedness and headaches.  Psychiatric/Behavioral: Negative for agitation and confusion.  All other systems reviewed and are negative.    Physical Exam Updated Vital Signs BP 105/76 (BP Location: Right Arm)   Pulse 100   Temp 98.4 F (36.9 C) (Oral)   Resp 18   Ht 5\' 4"  (1.626 m)   Wt 56.7 kg   LMP 08/14/2018 (Approximate)   SpO2 100%   BMI 21.46 kg/m   Physical Exam Vitals signs and nursing note reviewed.  Constitutional:      General: She is not in acute distress.    Appearance: Normal appearance. She is not ill-appearing, toxic-appearing  or diaphoretic.  HENT:     Head: Atraumatic.      Right Ear: There is no impacted cerumen.     Left Ear: There is no impacted cerumen.     Nose: Congestion and rhinorrhea present. No nasal deformity or septal deviation.     Right Nostril: No epistaxis or septal hematoma.     Left Nostril: No epistaxis or septal hematoma.     Right Sinus: Maxillary sinus tenderness present.     Left Sinus: Maxillary sinus tenderness present.     Mouth/Throat:     Mouth: Mucous membranes are moist.     Pharynx: No oropharyngeal exudate or posterior oropharyngeal erythema.  Eyes:     Extraocular Movements: Extraocular movements intact.     Conjunctiva/sclera: Conjunctivae normal.     Pupils: Pupils are equal, round, and reactive to light.  Neck:     Musculoskeletal: Normal range of motion. No neck rigidity or muscular  tenderness.  Cardiovascular:     Rate and Rhythm: Normal rate.     Pulses: Normal pulses.     Heart sounds: No murmur.  Pulmonary:     Effort: No respiratory distress.     Breath sounds: No stridor. No wheezing, rhonchi or rales.  Chest:     Chest wall: No tenderness.  Abdominal:     General: Abdomen is flat.     Tenderness: There is no abdominal tenderness.  Musculoskeletal:        General: Tenderness present. No swelling, deformity or signs of injury.     Left elbow: She exhibits normal range of motion, no swelling, no effusion, no deformity and no laceration. Tenderness found.       Arms:     Right lower leg: No edema.     Left lower leg: No edema.  Skin:    Findings: No erythema or rash.  Neurological:     General: No focal deficit present.     Mental Status: She is alert and oriented to person, place, and time.     Sensory: No sensory deficit.     Motor: No weakness.  Psychiatric:        Mood and Affect: Mood normal.      ED Treatments / Results  Labs (all labs ordered are listed, but only abnormal results are displayed) Labs Reviewed - No data to  display  EKG None  Radiology No results found.  Procedures Procedures (including critical care time)  Medications Ordered in ED Medications - No data to display   Initial Impression / Assessment and Plan / ED Course  I have reviewed the triage vital signs and the nursing notes.  Pertinent labs & imaging results that were available during my care of the patient were reviewed by me and considered in my medical decision making (see chart for details).        Jenny Evans is a 48 y.o. female with no significant past medical history who presents with sinus pain, pressure, rhinorrhea, congestion, and left elbow pain.  Patient reports that she has 2 primary complaints one being her sinus moving her elbow.  She reports her elbow has been giving her some mild to moderate pain for the last month.  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  She reports it is worsened when she overuses it and thinks it is overuse.  She denies any specific trauma.  No fevers or chills or erythema or swelling on the left elbow.  She has no history of clots in the arm.  She has not taken medication to help with symptoms.  She has never used a brace.  She thinks it is likely irritation from "something like carpal tunnel".  Her other complaint is rhinorrhea, congestion, and sinus pressure/pain for the last 2 weeks.  She reports it feels like a sinus infection.  She denies specific headaches, vision changes, nausea, vomiting, chest pain, shortness of breath, or cough.  She denies ear pain.  No difficulty breathing or swallowing.  No neck pain or neck stiffness.  No other complaints.  No urinary or GI symptoms.  On exam, patient does have sinus tenderness acutely on the maxilla bilaterally.  Normal extraocular movements and pupil exam.  Oropharyngeal exam otherwise unremarkable aside from congestion and rhinorrhea.  Lungs clear chest is nontender.  Left elbow is tender to palpation and  movement however normal grip strength and sensation.  Normal pulses.  Exam otherwise unremarkable.  Ear exam unremarkable.  I suspect patient's elbow is some overuse pain in the elbow.  Given lack of trauma, low suspicion for fracture dislocation.  Patient was offered x-ray however patient would rather follow-up with orthopedics as an outpatient for this and decided to hold.  Patient instructed to use an over-the-counter elbow brace and use conservative management with ice, rest, compression, elevation, and over-the-counter anti-inflammatories.  For the suspected sinus infection, patient's exam is consistent with a sinus infection.  Patient be treated with Augmentin which she has tolerated in the past and reports he has no allergic reaction to.  Patient will follow-up with PCP and understood return precautions.  Patient other questions or concerns and was discharged in good condition.  Final Clinical Impressions(s) / ED Diagnoses   Final diagnoses:  Acute non-recurrent maxillary sinusitis  Left elbow pain    ED Discharge Orders         Ordered    amoxicillin-clavulanate (AUGMENTIN) 875-125 MG tablet  2 times daily     08/21/18 0932          Clinical Impression: 1. Acute non-recurrent maxillary sinusitis   2. Left elbow pain     Disposition: Discharge  Condition: Good  I have discussed the results, Dx and Tx plan with the pt(& family if present). He/she/they expressed understanding and agree(s) with the plan. Discharge instructions discussed at great length. Strict return precautions discussed and pt &/or family have verbalized understanding of the instructions. No further questions at time of discharge.    New Prescriptions   AMOXICILLIN-CLAVULANATE (AUGMENTIN) 875-125 MG TABLET    Take 1 tablet by mouth 2 (two) times daily for 14 days.    Follow Up: Tarry Kos, MD [address] [place] [address] [phone]  Schedule an appointment as soon as  possible for a visit    Denny Levy [address] [place] [address] [phone]     [facility] DEPT [address] [id number] mc [address] [place] [address] [phone]       Tegeler, Canary Brim, MD 08/21/18 [phone]

## 2018-08-27 ENCOUNTER — Ambulatory Visit: Payer: No Typology Code available for payment source | Admitting: Family Medicine

## 2018-08-30 ENCOUNTER — Ambulatory Visit: Payer: No Typology Code available for payment source | Admitting: Family Medicine

## 2018-09-16 ENCOUNTER — Emergency Department (HOSPITAL_BASED_OUTPATIENT_CLINIC_OR_DEPARTMENT_OTHER): Payer: 59

## 2018-09-16 ENCOUNTER — Other Ambulatory Visit: Payer: Self-pay

## 2018-09-16 ENCOUNTER — Emergency Department (HOSPITAL_BASED_OUTPATIENT_CLINIC_OR_DEPARTMENT_OTHER)
Admission: EM | Admit: 2018-09-16 | Discharge: 2018-09-16 | Disposition: A | Discharge status: Home / Self Care | Payer: 59 | Attending: Emergency Medicine | Admitting: Emergency Medicine

## 2018-09-16 ENCOUNTER — Encounter (HOSPITAL_BASED_OUTPATIENT_CLINIC_OR_DEPARTMENT_OTHER): Payer: Self-pay | Admitting: *Deleted

## 2018-09-16 ENCOUNTER — Telehealth: Payer: No Typology Code available for payment source | Admitting: Physician Assistant

## 2018-09-16 DIAGNOSIS — J45909 Unspecified asthma, uncomplicated: Secondary | ICD-10-CM | POA: Insufficient documentation

## 2018-09-16 DIAGNOSIS — R05 Cough: Secondary | ICD-10-CM | POA: Diagnosis present

## 2018-09-16 DIAGNOSIS — B9789 Other viral agents as the cause of diseases classified elsewhere: Secondary | ICD-10-CM

## 2018-09-16 DIAGNOSIS — R0600 Dyspnea, unspecified: Secondary | ICD-10-CM

## 2018-09-16 DIAGNOSIS — J069 Acute upper respiratory infection, unspecified: Secondary | ICD-10-CM | POA: Insufficient documentation

## 2018-09-16 DIAGNOSIS — F17210 Nicotine dependence, cigarettes, uncomplicated: Secondary | ICD-10-CM | POA: Diagnosis not present

## 2018-09-16 DIAGNOSIS — Z20828 Contact with and (suspected) exposure to other viral communicable diseases: Secondary | ICD-10-CM | POA: Diagnosis not present

## 2018-09-16 DIAGNOSIS — Z880 Allergy status to penicillin: Secondary | ICD-10-CM | POA: Insufficient documentation

## 2018-09-16 DIAGNOSIS — Z3490 Encounter for supervision of normal pregnancy, unspecified, unspecified trimester: Secondary | ICD-10-CM

## 2018-09-16 LAB — GROUP A STREP BY PCR: Group A Strep by PCR: NOT DETECTED

## 2018-09-16 MED ORDER — BENZONATATE 100 MG PO CAPS: 100.0000 mg | ORAL_CAPSULE | Freq: Three times a day (TID) | ORAL | 0 refills | Status: DC

## 2018-09-16 MED FILL — BENZONATATE 100 MG CAP: 100 | 7 days supply | Qty: 21 | Fill #0

## 2018-09-16 NOTE — Discharge Instructions (Addendum)
Person Under Monitoring Name: Jenny Evans  Location: [address] [place] [address]   Infection Prevention Recommendations for Individuals Confirmed to have, or Being Evaluated for, 2019 Novel Coronavirus (COVID-19) Infection Who Receive Care at Home  Individuals who are confirmed to have, or are being evaluated for, COVID-19 should follow the prevention steps below until a healthcare provider or local or state health department says they can return to normal activities.  Stay home except to get medical care You should restrict activities outside your home, except for getting medical care. Do not go to work, school, or public areas, and do not use public transportation or taxis.  Call ahead before visiting your doctor Before your medical appointment, call the healthcare provider and tell them that you have, or are being evaluated for, COVID-19 infection. This will help the healthcare providers office take steps to keep other people from getting infected. Ask your healthcare provider to call the local or state health department.  Monitor your symptoms Seek prompt medical attention if your illness is worsening (e.g., difficulty breathing). Before going to your medical appointment, call the healthcare provider and tell them that you have, or are being evaluated for, COVID-19 infection. Ask your healthcare provider to call the local or state health department.  Wear a facemask You should wear a facemask that covers your nose and mouth when you are in the same room with other people and when you visit a healthcare provider. People who live with or visit you should also wear a facemask while they are in the same room with you.  Separate yourself from other people in your home As much as possible, you should stay in a different room from other people in your home. Also, you should use a separate bathroom, if available.  Avoid sharing household items You should not  share dishes, drinking glasses, cups, eating utensils, towels, bedding, or other items with other people in your home. After using these items, you should wash them thoroughly with soap and water.  Cover your coughs and sneezes Cover your mouth and nose with a tissue when you cough or sneeze, or you can cough or sneeze into your sleeve. Throw used tissues in a lined trash can, and immediately wash your hands with soap and water for at least 20 seconds or use an alcohol-based hand rub.  Wash your [employer] your hands often and thoroughly with soap and water for at least 20 seconds. You can use an alcohol-based hand sanitizer if soap and water are not available and if your hands are not visibly dirty. Avoid touching your eyes, nose, and mouth with unwashed hands.   Prevention Steps for Caregivers and Household Members of Individuals Confirmed to have, or Being Evaluated for, COVID-19 Infection Being Cared for in the Home  If you live with, or provide care at home for, a person confirmed to have, or being evaluated for, COVID-19 infection please follow these guidelines to prevent infection:  Follow healthcare providers instructions Make sure that you understand and can help the patient follow any healthcare provider instructions for all care.  Provide for the patients basic needs You should help the patient with basic needs in the home and provide support for getting groceries, prescriptions, and other personal needs.  Monitor the patients symptoms If they are getting sicker, call his or her medical provider and tell them that the patient has, or is being evaluated for, COVID-19 infection. This will help the healthcare providers  office take steps to keep other people from getting infected. Ask the healthcare provider to call the local or state health department.  Limit the number of people who have contact with the patient If possible, have only one caregiver for the  patient. Other household members should stay in another home or place of residence. If this is not possible, they should stay in another room, or be separated from the patient as much as possible. Use a separate bathroom, if available. Restrict visitors who do not have an essential need to be in the home.  Keep older adults, very young children, and other sick people away from the patient Keep older adults, very young children, and those who have compromised immune systems or chronic health conditions away from the patient. This includes people with chronic heart, lung, or kidney conditions, diabetes, and cancer.  Ensure good ventilation Make sure that shared spaces in the home have good air flow, such as from an air conditioner or an opened window, weather permitting.  Wash your hands often Wash your hands often and thoroughly with soap and water for at least 20 seconds. You can use an alcohol based hand sanitizer if soap and water are not available and if your hands are not visibly dirty. Avoid touching your eyes, nose, and mouth with unwashed hands. Use disposable paper towels to dry your hands. If not available, use dedicated cloth towels and replace them when they become wet.  Wear a facemask and gloves Wear a disposable facemask at all times in the room and gloves when you touch or have contact with the patients blood, body fluids, and/or secretions or excretions, such as sweat, saliva, sputum, nasal mucus, vomit, urine, or feces.  Ensure the mask fits over your nose and mouth tightly, and do not touch it during use. Throw out disposable facemasks and gloves after using them. Do not reuse. Wash your hands immediately after removing your facemask and gloves. If your personal clothing becomes contaminated, carefully remove clothing and launder. Wash your hands after handling contaminated clothing. Place all used disposable facemasks, gloves, and other waste in a lined container before  disposing them with other household waste. Remove gloves and wash your hands immediately after handling these items.  Do not share dishes, glasses, or other household items with the patient Avoid sharing household items. You should not share dishes, drinking glasses, cups, eating utensils, towels, bedding, or other items with a patient who is confirmed to have, or being evaluated for, COVID-19 infection. After the person uses these items, you should wash them thoroughly with soap and water.  Wash laundry thoroughly Immediately remove and wash clothes or bedding that have blood, body fluids, and/or secretions or excretions, such as sweat, saliva, sputum, nasal mucus, vomit, urine, or feces, on them. Wear gloves when handling laundry from the patient. Read and follow directions on labels of laundry or clothing items and detergent. In general, wash and dry with the warmest temperatures recommended on the label.  Clean all areas the individual has used often Clean all touchable surfaces, such as counters, tabletops, doorknobs, bathroom fixtures, toilets, phones, keyboards, tablets, and bedside tables, every day. Also, clean any surfaces that may have blood, body fluids, and/or secretions or excretions on them. Wear gloves when cleaning surfaces the patient has come in contact with. Use a diluted bleach solution (e.g., dilute bleach with 1 part bleach and 10 parts water) or a household disinfectant with a label that says EPA-registered for coronaviruses. To make a  bleach solution at home, add 1 tablespoon of bleach to 1 quart (4 cups) of water. For a larger supply, add  cup of bleach to 1 gallon (16 cups) of water. Read labels of cleaning products and follow recommendations provided on product labels. Labels contain instructions for safe and effective use of the cleaning product including precautions you should take when applying the product, such as wearing gloves or eye protection and making sure you  have good ventilation during use of the product. Remove gloves and wash hands immediately after cleaning.  Monitor yourself for signs and symptoms of illness Caregivers and household members are considered close contacts, should monitor their health, and will be asked to limit movement outside of the home to the extent possible. Follow the monitoring steps for close contacts listed on the symptom monitoring form.   ? If you have additional questions, contact your local health department or call the epidemiologist on call at [phone] (available 24/7). ? This guidance is subject to change. For the most up-to-date guidance from [facility], please refer to their website: YouBlogs.pl

## 2018-09-16 NOTE — Progress Notes (Signed)
  E-Visit for [employer] Virus Screening  Based on what you have shared with me, you need to seek an evaluation for a severe illness that is causing your symptoms which may be coronavirus or some other illness. I recommend that you be seen and evaluated "face to face" - giving shortness of breath. I would call your OB to see if they recommend the ER at [facility] giving pregnancy status.    I recommend the following:  . If you are having a true medical emergency please call 911. . If you are considered high risk for Corona virus because of a known exposure, fever, shortness of breath and cough, OR if you have severe symptoms of any kind, seek medical care at an emergency room.  . Please call ahead and tell them that you were seen by telemedicine and they have recommended that you have a face to face evaluation. [facility] Emergency Department [address] [place], [place], [place] [address] [phone]  . [facility] Emergency Department [address] [place], [place] [address] [phone]  . [facility] Emergency Department [address] [place], [place], [place] [address] [phone]  . [facility] Emergency Department [address] [place], [place], [place] [address] [phone]  . [facility] Emergency Department [address] [place], [place], [place] [address] [phone]  NOTE: If you entered your credit card information for this eVisit, you will not be charged. You may see a "hold" on your card for the $35 but that hold will drop off and you will not have a charge processed.   Your e-visit answers were reviewed by a board certified advanced clinical practitioner to complete your personal care plan.  Thank you for using e-Visits.

## 2018-09-16 NOTE — ED Provider Notes (Signed)
[facility] EMERGENCY DEPARTMENT Provider Note   CSN: [id number] Arrival date & time: 09/16/18  1502    History   Chief Complaint Chief Complaint  Patient presents with   Cough   Shortness of Breath    HPI Jenny Evans is a 48 y.o. female with history of migraines, GERD, gastric ulcer who presents with a 5-day history of dry cough and sore throat.  Patient reports she had fever the first day or so.  She also had one episode of diarrhea.  She denies any vomiting, abdominal pain, chest pain or significant shortness of breath.  No medications taken prior to arrival.  She has had a contact with a coworker that tested positive for COVID-19.  She denies any dizziness or lightheadedness.     HPI  Past Medical History:  Diagnosis Date   Anemia    Asthma    Blood transfusion without reported diagnosis    Chronic pain    GERD (gastroesophageal reflux disease)    Migraine    Sickle cell trait (HCC)    Ulcer     Patient Active Problem List   Diagnosis Date Noted   Sickle cell trait (HCC) 06/14/2017   Symptomatic anemia 06/14/2017   Blood loss anemia 11/12/2015   Melena 11/12/2015   Nonspecific abnormal electrocardiogram (ECG) (EKG) 04/03/2015   Palpitations 04/03/2015   Neck pain 04/03/2015   Colitis 07/10/2014   Absolute anemia    Post-operative state 08/26/2013   Rectocele 08/26/2013   Anal or rectal pain 07/29/2013   Headache(784.0) 02/27/2012   ACUTE BRONCHITIS 03/25/2010   OBESITY 09/06/2009   INSOMNIA 01/23/2009   HYPOKALEMIA 11/01/2008   DYSPEPSIA&OTHER SPEC DISORDERS FUNCTION STOMACH 06/07/2008   ALLERGIC RHINITIS 03/23/2008   CONSTIPATION 01/20/2008   Shortness of breath 08/27/2007   TOBACCO ABUSE 03/09/2007   Migraine 03/09/2007   ABSCESS, TOOTH 03/09/2007   GERD (gastroesophageal reflux disease) 03/09/2007   Migraine variant 02/25/2007   ECZEMA 02/25/2007    Past Surgical History:  Procedure Laterality  Date   ESOPHAGOGASTRODUODENOSCOPY (EGD) WITH PROPOFOL N/A 04/04/2013   Procedure: ESOPHAGOGASTRODUODENOSCOPY (EGD) WITH PROPOFOL;  Surgeon: Willis Modena, MD;  Location: [facility];  Service: Endoscopy;  Laterality: N/A;   EXAMINATION UNDER ANESTHESIA N/A 08/10/2013   Procedure: RECTAL EXAM UNDER ANESTHESIA;  Surgeon: Clovis Pu. Cornett, MD;  Location: [facility];  Service: General;  Laterality: N/A;  ligation   HEMORRHOID SURGERY     MYRINGOTOMY     age 83     OB History   No obstetric history on file.      Home Medications    Prior to Admission medications   Medication Sig Start Date End Date Taking? Authorizing Provider  albuterol (PROVENTIL HFA;VENTOLIN HFA) 108 (90 Base) MCG/ACT inhaler Inhale 1-2 puffs into the lungs every 6 (six) hours as needed for wheezing or shortness of breath. 04/08/18   Long, [name], MD  Aspirin-Salicylamide-Caffeine (BC HEADACHE PO) Take 1 packet by mouth 2 (two) times daily as needed (migraine).    [provider]  benzonatate (TESSALON) 100 MG capsule Take 1 capsule (100 mg total) by mouth every 8 (eight) hours. 09/16/18   Shivam Mestas, Waylan Boga, PA-C  fluticasone (FLONASE) 50 MCG/ACT nasal spray Place 2 sprays into both nostrils daily as needed for allergies or rhinitis. Patient not taking: Reported on 04/08/2018 01/18/18   Raeford Razor, MD  methocarbamol (ROBAXIN) 500 MG tablet Take 1 tablet (500 mg total) by mouth 2 (two) times daily. Patient not  taking: Reported on 04/08/2018 01/31/18   Hedges, Tinnie Gens, PA-C  naproxen (NAPROSYN) 500 MG tablet Take 1 tablet (500 mg total) by mouth 2 (two) times daily. Patient not taking: Reported on 04/08/2018 01/31/18   Hedges, Tinnie Gens, PA-C  potassium chloride SA (K-DUR,KLOR-CON) 20 MEQ tablet Take 2 tablets (40 mEq total) by mouth 2 (two) times daily. Patient not taking: Reported on 01/18/2018 12/10/17   Mesner, Barbara Cower, MD    Family History Family History  Problem Relation Age of Onset    Heart disease Mother        Arrythmia   Kidney disease Mother    Hypertension Mother    COPD Mother    Diabetes Mother    Lung cancer Mother    Heart disease Father    Crohn's disease Maternal Aunt    Breast cancer Maternal Aunt        37's    Social History Social History   Tobacco Use   Smoking status: Current Some Day Smoker    Packs/day: 0.25    Years: 15.00    Pack years: 3.75    Types: Cigarettes   Smokeless tobacco: Never Used  Substance Use Topics   Alcohol use: No   Drug use: No     Allergies   Hydrocodone-acetaminophen and Penicillins   Review of Systems Review of Systems  Constitutional: Positive for fever. Negative for chills.  HENT: Positive for congestion and sore throat. Negative for ear pain and facial swelling.   Respiratory: Positive for cough. Negative for shortness of breath.   Cardiovascular: Negative for chest pain.  Gastrointestinal: Positive for diarrhea (resolved). Negative for abdominal pain, nausea and vomiting.  Genitourinary: Negative for dysuria.  Musculoskeletal: Negative for back pain.  Skin: Negative for rash and wound.  Neurological: Negative for dizziness, light-headedness and headaches.  Psychiatric/Behavioral: The patient is not nervous/anxious.      Physical Exam Updated Vital Signs BP 126/75 (BP Location: Right Arm)    Pulse 71    Temp 98.3 F (36.8 C)    Resp (!) 22    Ht 5\' 4"  (1.626 m)    Wt 56.7 kg    LMP 08/16/2018    SpO2 98%    BMI 21.46 kg/m   Physical Exam Vitals signs and nursing note reviewed.  Constitutional:      General: She is not in acute distress.    Appearance: She is well-developed. She is not diaphoretic.  HENT:     Head: Normocephalic and atraumatic.     Right Ear: Tympanic membrane normal.     Left Ear: Tympanic membrane normal.     Nose: Congestion present.     Mouth/Throat:     Pharynx: Posterior oropharyngeal erythema present. No oropharyngeal exudate.  Eyes:     General: No  scleral icterus.       Right eye: No discharge.        Left eye: No discharge.     Conjunctiva/sclera: Conjunctivae normal.     Pupils: Pupils are equal, round, and reactive to light.  Neck:     Musculoskeletal: Normal range of motion and neck supple.     Thyroid: No thyromegaly.  Cardiovascular:     Rate and Rhythm: Normal rate and regular rhythm.     Heart sounds: Normal heart sounds. No murmur. No friction rub. No gallop.   Pulmonary:     Effort: Pulmonary effort is normal. No respiratory distress.     Breath sounds: No stridor. Decreased breath sounds present.  No wheezing or rales.  Abdominal:     General: Bowel sounds are normal. There is no distension.     Palpations: Abdomen is soft.     Tenderness: There is no abdominal tenderness. There is no guarding or rebound.  Lymphadenopathy:     Cervical: No cervical adenopathy.  Skin:    General: Skin is warm and dry.     Coloration: Skin is not pale.     Findings: No rash.  Neurological:     Mental Status: She is alert.     Coordination: Coordination normal.      ED Treatments / Results  Labs (all labs ordered are listed, but only abnormal results are displayed) Labs Reviewed  GROUP A STREP BY PCR    EKG None  Radiology Dg Chest Portable 1 View  Result Date: 09/16/2018 CLINICAL DATA:  Cough, shortness of breath. EXAM: PORTABLE CHEST 1 VIEW COMPARISON:  Radiographs of April 08, 2018. FINDINGS: The heart size and mediastinal contours are within normal limits. Both lungs are clear. No pneumothorax or pleural effusion is noted. The visualized skeletal structures are unremarkable. IMPRESSION: No active disease. Electronically Signed   By: Lupita Raider, M.D.   On: 09/16/2018 17:02    Procedures Procedures (including critical care time)  Medications Ordered in ED Medications - No data to display   Initial Impression / Assessment and Plan / ED Course  I have reviewed the triage vital signs and the nursing  notes.  Pertinent labs & imaging results that were available during my care of the patient were reviewed by me and considered in my medical decision making (see chart for details).        Patient presenting with cough and shortness of breath with no known contact with a person that is positive for COVID-19.  Chest x-ray is clear.  Strep PCR is negative.  Patient is well-appearing and in no acute distress.  She is mildly tachypneic, however this is mostly after coughing.  She is saturating 100% room air.  She is not tachycardic.  Do not feel she needs admission at this time.  Will discharge home with strict quarantine precautions.  Strict return precautions discussed including worsening shortness of breath, passing out.  Discussed supportive treatment.  Patient understands and agrees with plan.  Patient vital stable throughout ED course and discharged in satisfactory condition.  Jenny Evans was evaluated in Emergency Department on 09/16/2018 for the symptoms described in the history of present illness. She was evaluated in the context of the global COVID-19 pandemic, which necessitated consideration that the patient might be at risk for infection with the SARS-CoV-2 virus that causes COVID-19. Institutional protocols and algorithms that pertain to the evaluation of patients at risk for COVID-19 are in a state of rapid change based on information released by regulatory bodies including the CDC and federal and state organizations. These policies and algorithms were followed during the patient's care in the ED.   Final Clinical Impressions(s) / ED Diagnoses   Final diagnoses:  Viral URI with cough    ED Discharge Orders         Ordered    benzonatate (TESSALON) 100 MG capsule  Every 8 hours     09/16/18 [address] [place], PA-C 09/16/18 1743    Blane Ohara, MD 09/22/18 2029

## 2018-09-16 NOTE — ED Triage Notes (Signed)
Pt has a dry cough, sob and fever. Symptoms x 3 days. She was exposed to a pt with known Covid19. Pt was taken directly to negative pressure room. Telephone triage was done.

## 2018-12-14 ENCOUNTER — Other Ambulatory Visit: Payer: Self-pay

## 2018-12-14 ENCOUNTER — Emergency Department (HOSPITAL_COMMUNITY)
Admission: EM | Admit: 2018-12-14 | Discharge: 2018-12-14 | Disposition: A | Discharge status: Home / Self Care | Payer: 59 | Attending: Emergency Medicine | Admitting: Emergency Medicine

## 2018-12-14 ENCOUNTER — Encounter (HOSPITAL_COMMUNITY): Payer: Self-pay

## 2018-12-14 DIAGNOSIS — R0981 Nasal congestion: Secondary | ICD-10-CM | POA: Diagnosis present

## 2018-12-14 DIAGNOSIS — J0110 Acute frontal sinusitis, unspecified: Secondary | ICD-10-CM | POA: Diagnosis not present

## 2018-12-14 DIAGNOSIS — J45909 Unspecified asthma, uncomplicated: Secondary | ICD-10-CM | POA: Diagnosis not present

## 2018-12-14 DIAGNOSIS — F17210 Nicotine dependence, cigarettes, uncomplicated: Secondary | ICD-10-CM | POA: Insufficient documentation

## 2018-12-14 MED ORDER — SALINE SPRAY 0.65 % NA SOLN
1.0000 | Freq: Once | NASAL | Status: AC
  Administered 2018-12-14: 19:00:00 1 via NASAL
  Filled 2018-12-14: qty 44

## 2018-12-14 MED ORDER — ACETAMINOPHEN 325 MG PO TABS
650.0000 mg | ORAL_TABLET | Freq: Once | ORAL | Status: AC
  Administered 2018-12-14: 650 mg via ORAL
  Filled 2018-12-14: qty 2

## 2018-12-14 MED ORDER — SALINE SPRAY 0.65 % NA SOLN: 1.0000 | NASAL | 0 refills | Status: DC | PRN

## 2018-12-14 NOTE — ED Triage Notes (Signed)
Patient c/o sinus congestion/headache x 3 days. patient states she has been taking OTC meds with no relief.

## 2018-12-14 NOTE — Discharge Instructions (Signed)
You have been seen today for congestion and headache. Please read and follow all provided instructions.   1. Medications: nasal spray for congestion, usual home medications 2. Treatment: rest, drink plenty of fluids 3. Follow Up: Please follow up with your primary doctor in 2-5 days for discussion of your diagnoses and further evaluation after today's visit; if you do not have a primary care doctor use the resource guide provided to find one; Please return to the ER for any new or worsening symptoms. Please obtain all of your results from medical records or have your doctors office obtain the results - share them with your doctor - you should be seen at your doctors office. Call today to arrange your follow up.   Take medications as prescribed. Please review all of the medicines and only take them if you do not have an allergy to them. Return to the emergency room for worsening condition or new concerning symptoms. Follow up with your regular doctor. If you don't have a regular doctor use one of the numbers below to establish a primary care doctor.  Please be aware that if you are taking birth control pills, taking other prescriptions, ESPECIALLY ANTIBIOTICS may make the birth control ineffective - if this is the case, either do not engage in sexual activity or use alternative methods of birth control such as condoms until you have finished the medicine and your family doctor says it is OK to restart them. If you are on a blood thinner such as COUMADIN, be aware that any other medicine that you take may cause the coumadin to either work too much, or not enough - you should have your coumadin level rechecked in next 7 days if this is the case.  ?  It is also a possibility that you have an allergic reaction to any of the medicines that you have been prescribed - Everybody reacts differently to medications and while MOST people have no trouble with most medicines, you may have a reaction such as nausea,  vomiting, rash, swelling, shortness of breath. If this is the case, please stop taking the medicine immediately and contact your physician.  ?  You should return to the ER if you develop severe or worsening symptoms.   Emergency Department Resource Guide 1) Find a Doctor and Pay Out of Pocket Although you won't have to find out who is covered by your insurance plan, it is a good idea to ask around and get recommendations. You will then need to call the office and see if the doctor you have chosen will accept you as a new patient and what types of options they offer for patients who are self-pay. Some doctors offer discounts or will set up payment plans for their patients who do not have insurance, but you will need to ask so you aren't surprised when you get to your appointment.  2) Contact Your Local Health Department Not all health departments have doctors that can see patients for sick visits, but many do, so it is worth a call to see if yours does. If you don't know where your local health department is, you can check in your phone book. The CDC also has a tool to help you locate your state's health department, and many state websites also have listings of all of their local health departments.  3) Find a [facility] If your illness is not likely to be very severe or complicated, you may want to try a walk in clinic. These  are popping up all over the country in pharmacies, drugstores, and shopping centers. They're usually staffed by nurse practitioners or physician assistants that have been trained to treat common illnesses and complaints. They're usually fairly quick and inexpensive. However, if you have serious medical issues or chronic medical problems, these are probably not your best option.  No Primary Care Doctor: Call Health Connect at  [phone] - they can help you locate a primary care doctor that  accepts your insurance, provides certain services, etc. Physician Referral Service-  [phone]  Chronic Pain Problems: Organization         Address  Phone   Notes  [facility]  [phone] Patients need to be referred by their primary care doctor.   Medication Assistance: Organization         Address  Phone   Notes  [facility] Medication [facility] [address] [place], [place] [address] [phone] --Must be a resident of [facility] -- Must have NO insurance coverage whatsoever (no Medicaid/ Medicare, etc.) -- The pt. MUST have a primary care doctor that directs their care regularly and follows them in the community   [facility]  [phone]   [employer]  [phone]    Agencies that provide inexpensive medical care: Organization         Address  Phone   Notes  [place]  [phone]   [facility]    [phone]   [facility] [place], [place] [address] [phone]   [place] [address] [place] [phone]   [facility]    [phone]   [facility]    [phone]   [place] and [place] [address], [place] Phone:  [phone], Fax:  [phone] Hours of Operation:  9 am - 6 pm, M-F.  Also accepts Medicaid/Medicare and self-pay.  [facility] [place], [address], [place] Phone: [phone], Fax: [phone]. Hours of Operation:  8:30 am - 5:30 pm, M-F.  Also accepts Medicaid and self-pay.  [facility] [address] [place] Phone: [phone]   [place], [place], [place] [phone], Ext. [phone] Mondays & Thursdays: 7-9 AM.  First 15 patients are seen on a first come, first serve basis.    [place] Providers:  Organization         Address  Phone   Notes  [facility] [address],  [place] [phone] Also accepts self-pay patients.  [facility] [address], [place]  [phone]   [place], [address], [place] [phone]   [facility] [address] [place] [phone]   Lucianne Lei [address], [place]   [phone] Only accepts [place] [facility] [place] patients after they have their name applied to their card.   Self-Pay (no insurance) in [facility]:  Organization         Address  Phone   Notes  Sickle Cell Patients, [facility] [place] [phone]   [facility] [place] [phone]   [facility] [place]  [phone]  [place] [address], [place] [phone]   [facility]/Dr. Osei-Bonsu  [address] [place] or [address], [place] [phone] Phone number for both [place] and [place] locations is the same.  [facility] [address] [place] [phone]   [facility] [address], [place] or [address] [phone]   [facility] [address], [place] [phone], phone; [phone], fax Sees patients 1st and 3rd Saturday of every month.  Must not qualify for public or private insurance (i.e. Medicaid, Medicare, [place] Health Choice, Veterans' Benefits)  Household income should be no more than 200% of the poverty level The clinic cannot treat you if you are pregnant or think you are pregnant  Sexually transmitted diseases are not treated at the clinic.

## 2018-12-14 NOTE — ED Provider Notes (Signed)
[facility]-EMERGENCY DEPT Provider Note   CSN: [id number] Arrival date & time: 12/14/18  1541    History   Chief Complaint Chief Complaint  Patient presents with  . [id number] congestion    HPI Jenny Evans is a 48 y.o. female with a PMH of Anemia, Asthma, Chronic Pain, Migraines, and GERD presenting due to sinus congestion and an intermittent frontal headache onset 3 days ago. Patient describes headache as a pressure and states nothing makes symptoms worse. Patient reports headache is similar to previous headaches. Patient denies nausea, vomiting, cough, sore throat, fever, chills, sick exposures, or recent travel. Patient states she has been taking benadryl, tylenol, and allegra with partial relief. Patient states she last took medications at 5am today. Patient states she has had sinus infections in the past. Patient denies vision changes, numbness, weakness, or syncope. Patient denies neck pain or rash.     HPI  Past Medical History:  Diagnosis Date  . Anemia   . Asthma   . Blood transfusion without reported diagnosis   . Chronic pain   . GERD (gastroesophageal reflux disease)   . Migraine   . Sickle cell trait (HCC)   . Ulcer     Patient Active Problem List   Diagnosis Date Noted  . Sickle cell trait (HCC) 06/14/2017  . Symptomatic anemia 06/14/2017  . Blood loss anemia 11/12/2015  . Melena 11/12/2015  . Nonspecific abnormal electrocardiogram (ECG) (EKG) 04/03/2015  . Palpitations 04/03/2015  . Neck pain 04/03/2015  . Colitis 07/10/2014  . Absolute anemia   . Post-operative state 08/26/2013  . Rectocele 08/26/2013  . Anal or rectal pain 07/29/2013  . Headache(784.0) 02/27/2012  . ACUTE BRONCHITIS 03/25/2010  . OBESITY 09/06/2009  . INSOMNIA 01/23/2009  . HYPOKALEMIA 11/01/2008  . DYS[facility]PSIA&OTHER [facility] DISORDERS FUNCTION STOMACH 06/07/2008  . ALLERGIC RHINITIS 03/23/2008  . CONSTIPATION 01/20/2008  . Shortness of breath 08/27/2007  .  TOBACCO ABUSE 03/09/2007  . Migraine 03/09/2007  . ABSCESS, TOOTH 03/09/2007  . GERD (gastroesophageal reflux disease) 03/09/2007  . Migraine variant 02/25/2007  . ECZEMA 02/25/2007    Past Surgical History:  Procedure Laterality Date  . ESOPHAGOGASTRODUODENOSCOPY (EGD) WITH PROPOFOL N/A 04/04/2013   Procedure: ESOPHAGOGASTRODUODENOSCOPY (EGD) WITH PROPOFOL;  Surgeon: Willis ModenaWilliam Outlaw, MD;  Location: [facility];  Service: Endoscopy;  Laterality: N/A;  . EXAMINATION UNDER ANESTHESIA N/A 08/10/2013   Procedure: RECTAL EXAM UNDER ANESTHESIA;  Surgeon: Clovis Puhomas A. Cornett, MD;  Location: [facility];  Service: General;  Laterality: N/A;  ligation  . HEMORRHOID SURGERY    . MYRINGOTOMY     age 48     OB History   No obstetric history on file.      Home Medications    Prior to Admission medications   Medication Sig Start Date End Date Taking? Authorizing Provider  albuterol (PROVENTIL HFA;VENTOLIN HFA) 108 (90 Base) MCG/ACT inhaler Inhale 1-2 puffs into the lungs every 6 (six) hours as needed for wheezing or shortness of breath. 04/08/18   Long, Arlyss RepressJoshua G, MD  Aspirin-Salicylamide-Caffeine (BC HEADACHE PO) Take 1 packet by mouth 2 (two) times daily as needed (migraine).    [provider]  benzonatate (TESSALON) 100 MG capsule Take 1 capsule (100 mg total) by mouth every 8 (eight) hours. 09/16/18   Law, Waylan BogaAlexandra M, PA-C  fluticasone (FLONASE) 50 MCG/ACT nasal spray Place 2 sprays into both nostrils daily as needed for allergies or rhinitis. Patient not taking: Reported on 04/08/2018 01/18/18   Raeford RazorKohut, Stephen,  MD  methocarbamol (ROBAXIN) 500 MG tablet Take 1 tablet (500 mg total) by mouth 2 (two) times daily. Patient not taking: Reported on 04/08/2018 01/31/18   Hedges, Tinnie GensJeffrey, PA-C  naproxen (NAPROSYN) 500 MG tablet Take 1 tablet (500 mg total) by mouth 2 (two) times daily. Patient not taking: Reported on 04/08/2018 01/31/18   Hedges, Tinnie GensJeffrey, PA-C  potassium chloride  SA (K-DUR,KLOR-CON) 20 MEQ tablet Take 2 tablets (40 mEq total) by mouth 2 (two) times daily. Patient not taking: Reported on 01/18/2018 12/10/17   Mesner, Barbara CowerJason, MD  sodium chloride (OCEAN) 0.65 % SOLN nasal spray Place 1 spray into both nostrils as needed for congestion. 12/14/18   Leretha DykesHernandez, Johnny Gorter P, PA-C    Family History Family History  Problem Relation Age of Onset  . Heart disease Mother        Arrythmia  . Kidney disease Mother   . Hypertension Mother   . COPD Mother   . Diabetes Mother   . Lung cancer Mother   . Heart disease Father   . Crohn's disease Maternal Aunt   . Breast cancer Maternal Aunt        4540's    Social History Social History   Tobacco Use  . Smoking status: Current Some Day Smoker    Packs/day: 0.25    Years: 15.00    Pack years: 3.75    Types: Cigarettes  . Smokeless tobacco: Never Used  Substance Use Topics  . Alcohol use: No  . Drug use: No     Allergies   Hydrocodone-acetaminophen and Penicillins   Review of Systems Review of Systems  Constitutional: Negative for activity change, appetite change, chills, diaphoresis, fatigue, fever and unexpected weight change.  HENT: Positive for congestion. Negative for ear pain, facial swelling, sinus pressure, sinus pain and sore throat.   Eyes: Negative for photophobia and visual disturbance.  Respiratory: Negative for cough and shortness of breath.   Cardiovascular: Negative for chest pain.  Gastrointestinal: Negative for abdominal pain, diarrhea, nausea and vomiting.  Musculoskeletal: Negative for gait problem, myalgias, neck pain and neck stiffness.  Skin: Negative for rash.  Allergic/Immunologic: Negative for immunocompromised state.  Neurological: Positive for headaches. Negative for dizziness, tremors, seizures, syncope, facial asymmetry, speech difficulty, weakness, light-headedness and numbness.  Hematological: Does not bruise/bleed easily.  Psychiatric/Behavioral: Negative for confusion.     Physical Exam Updated Vital Signs BP 115/85 (BP Location: Left Arm)   Pulse 86   Temp 98.3 F (36.8 C) (Oral)   Resp 18   Ht 5\' 4"  (1.626 m)   Wt 58.5 kg   LMP 11/15/2018   SpO2 100%   BMI 22.14 kg/m   Physical Exam Vitals signs and nursing note reviewed.  Constitutional:      General: She is not in acute distress.    Appearance: She is well-developed. She is not diaphoretic.  HENT:     Head: Normocephalic and atraumatic.     Right Ear: Tympanic membrane, ear canal and external ear normal.     Left Ear: Tympanic membrane, ear canal and external ear normal.     Nose: Congestion and rhinorrhea present.     Right Sinus: Frontal sinus tenderness present. No maxillary sinus tenderness.     Left Sinus: Frontal sinus tenderness present. No maxillary sinus tenderness.     Mouth/Throat:     Mouth: Mucous membranes are moist.     Pharynx: No oropharyngeal exudate or posterior oropharyngeal erythema.  Eyes:     Extraocular Movements:  Extraocular movements intact.     Conjunctiva/sclera: Conjunctivae normal.     Pupils: Pupils are equal, round, and reactive to light.  Neck:     Musculoskeletal: Normal range of motion.  Cardiovascular:     Rate and Rhythm: Normal rate and regular rhythm.     Heart sounds: Normal heart sounds. No murmur. No friction rub. No gallop.   Pulmonary:     Effort: Pulmonary effort is normal. No respiratory distress.     Breath sounds: Normal breath sounds. No wheezing or rales.  Abdominal:     Palpations: Abdomen is soft.     Tenderness: There is no abdominal tenderness.  Musculoskeletal: Normal range of motion.  Skin:    General: Skin is warm.     Findings: No erythema or rash.  Neurological:     Mental Status: She is alert.    Mental Status:  Alert, oriented, thought content appropriate, able to give a coherent history. Speech fluent without evidence of aphasia. Able to follow 2 step commands without difficulty.  Cranial Nerves:  II:  Peripheral  visual fields grossly normal, pupils equal, round, reactive to light III,IV, VI: ptosis not present, extra-ocular motions intact bilaterally  V,VII: smile symmetric, facial light touch sensation equal VIII: hearing grossly normal to voice  IX,X: symmetric elevation of soft palate, uvula elevates symmetrically  XI: bilateral shoulder shrug symmetric and strong XII: midline tongue extension without fassiculations Motor:  Normal tone. 5/5 in upper and lower extremities bilaterally including strong and equal grip strength and dorsiflexion/plantar flexion Sensory: Pinprick and light touch normal in all extremities.  Deep Tendon Reflexes: 2+ and symmetric in the biceps and patella Cerebellar: normal finger-to-nose with bilateral upper extremities Gait: normal gait and balance.  Negative pronator drift. Negative Romberg sign. CV: distal pulses palpable throughout    ED Treatments / Results  Labs (all labs ordered are listed, but only abnormal results are displayed) Labs Reviewed - No data to display  EKG None  Radiology No results found.  Procedures Procedures (including critical care time)  Medications Ordered in ED Medications  sodium chloride (OCEAN) 0.65 % nasal spray 1 spray (has no administration in time range)  acetaminophen (TYLENOL) tablet 650 mg (650 mg Oral Given 12/14/18 1820)     Initial Impression / Assessment and Plan / ED Course  I have reviewed the triage vital signs and the nursing notes.  Pertinent labs & imaging results that were available during my care of the patient were reviewed by me and considered in my medical decision making (see chart for details).       Patient complaining of symptoms of sinusitis. Mild to moderate symptoms of clear nasal discharge/congestion for less than 10 days.  Patient is afebrile.  No concern for acute bacterial rhinosinusitis; likely viral in nature.  Patient discharged with symptomatic treatment.  Patient instructions given  for saline nasal washes.  Recommendations for follow-up with primary care physician. Discussed return precautions.  Pt HA treated and improved while in ED.  Presentation is like pts typical HA and non concerning for [facility], ICH, Meningitis, or temporal arteritis. Pt is afebrile with no focal neuro deficits, nuchal rigidity, or change in vision. Pt is to follow up with PCP to discuss prophylactic medication. Pt verbalizes understanding and is agreeable with plan to dc.   Final Clinical Impressions(s) / ED Diagnoses   Final diagnoses:  Nasal congestion  Acute non-recurrent frontal sinusitis    ED Discharge Orders  Ordered    sodium chloride (OCEAN) 0.65 % SOLN nasal spray  As needed     12/14/18 1824           Arville Lime, [place] 12/14/18 1900    Little, Wenda Overland, MD 12/16/18 1511

## 2019-03-20 ENCOUNTER — Emergency Department (HOSPITAL_COMMUNITY)
Admission: EM | Admit: 2019-03-20 | Discharge: 2019-03-20 | Disposition: A | Discharge status: Home / Self Care | Payer: 59 | Attending: Emergency Medicine | Admitting: Emergency Medicine

## 2019-03-20 ENCOUNTER — Encounter (HOSPITAL_COMMUNITY): Payer: Self-pay

## 2019-03-20 ENCOUNTER — Other Ambulatory Visit: Payer: Self-pay

## 2019-03-20 DIAGNOSIS — J45909 Unspecified asthma, uncomplicated: Secondary | ICD-10-CM | POA: Diagnosis not present

## 2019-03-20 DIAGNOSIS — F17210 Nicotine dependence, cigarettes, uncomplicated: Secondary | ICD-10-CM | POA: Diagnosis not present

## 2019-03-20 DIAGNOSIS — Z79899 Other long term (current) drug therapy: Secondary | ICD-10-CM | POA: Insufficient documentation

## 2019-03-20 DIAGNOSIS — R519 Headache, unspecified: Secondary | ICD-10-CM

## 2019-03-20 MED ORDER — SUMATRIPTAN SUCCINATE 6 MG/0.5ML ~~LOC~~ SOLN
6.0000 mg | Freq: Once | SUBCUTANEOUS | Status: AC
  Administered 2019-03-20: 6 mg via SUBCUTANEOUS
  Filled 2019-03-20: qty 0.5

## 2019-03-20 MED ORDER — SUMATRIPTAN SUCCINATE 6 MG/0.5ML ~~LOC~~ SOLN: 6.0000 mg | Freq: Once | SUBCUTANEOUS | 0 refills | Status: DC

## 2019-03-20 NOTE — ED Triage Notes (Signed)
Pt c/o migraine headache since 0100. Pt states it is mostly on the left front side of her head. Pt states no relief with BC powder.

## 2019-03-20 NOTE — Discharge Instructions (Addendum)
Please read instructions below. You can take your imitrex as prescribed as needed for headache. Schedule an appointment with your primary care provider. Return to the ER for severely worsening headache, vision changes, fever, weakness or numbness, or new or concerning symptoms.

## 2019-03-20 NOTE — ED Provider Notes (Signed)
[facility]-EMERGENCY DEPT Provider Note   CSN: [id number] Arrival date & time: 03/20/19  0903     History   Chief Complaint Chief Complaint  Patient presents with  . Migraine    HPI Jenny Evans is a 48 y.o. female PMHx cluster HA, migraine HA, GERD, presenting with HA that started at 0100. HA is described as her typical HA, a dull pain of left head radiating to neck.  She has some assoc lacrimation of the left eye, photophobia and phonophobia. She treated with BC powder at 0100 which alleviated HA until 0400. Pain has been constant since then. She initially had nausea though that had subsided. She successfully treats her HA at home with SQ imitrex, however ran out of her medication and forgot to ask her PCP for a refill. No assoc vision changes or fever.      The history is provided by the patient.    Past Medical History:  Diagnosis Date  . Anemia   . Asthma   . Blood transfusion without reported diagnosis   . Chronic pain   . GERD (gastroesophageal reflux disease)   . Migraine   . Sickle cell trait (HCC)   . Ulcer     Patient Active Problem List   Diagnosis Date Noted  . Sickle cell trait (HCC) 06/14/2017  . Symptomatic anemia 06/14/2017  . Blood loss anemia 11/12/2015  . Melena 11/12/2015  . Nonspecific abnormal electrocardiogram (ECG) (EKG) 04/03/2015  . Palpitations 04/03/2015  . Neck pain 04/03/2015  . Colitis 07/10/2014  . Absolute anemia   . Post-operative state 08/26/2013  . Rectocele 08/26/2013  . Anal or rectal pain 07/29/2013  . Headache(784.0) 02/27/2012  . ACUTE BRONCHITIS 03/25/2010  . OBESITY 09/06/2009  . INSOMNIA 01/23/2009  . HYPOKALEMIA 11/01/2008  . DYSPEPSIA&OTHER [facility] DISORDERS FUNCTION STOMACH 06/07/2008  . ALLERGIC RHINITIS 03/23/2008  . CONSTIPATION 01/20/2008  . Shortness of breath 08/27/2007  . TOBACCO ABUSE 03/09/2007  . Migraine 03/09/2007  . ABSCESS, TOOTH 03/09/2007  . GERD (gastroesophageal reflux  disease) 03/09/2007  . Migraine variant 02/25/2007  . ECZEMA 02/25/2007    Past Surgical History:  Procedure Laterality Date  . ESOPHAGOGASTRODUODENOSCOPY (EGD) WITH PROPOFOL N/A 04/04/2013   Procedure: ESOPHAGOGASTRODUODENOSCOPY (EGD) WITH PROPOFOL;  Surgeon: Willis Modena, MD;  Location: [facility];  Service: Endoscopy;  Laterality: N/A;  . EXAMINATION UNDER ANESTHESIA N/A 08/10/2013   Procedure: RECTAL EXAM UNDER ANESTHESIA;  Surgeon: Clovis Pu. Cornett, MD;  Location: [facility];  Service: General;  Laterality: N/A;  ligation  . HEMORRHOID SURGERY    . MYRINGOTOMY     age 68     OB History   No obstetric history on file.      Home Medications    Prior to Admission medications   Medication Sig Start Date End Date Taking? Authorizing Provider  albuterol (PROVENTIL HFA;VENTOLIN HFA) 108 (90 Base) MCG/ACT inhaler Inhale 1-2 puffs into the lungs every 6 (six) hours as needed for wheezing or shortness of breath. 04/08/18   Long, [name], MD  Aspirin-Salicylamide-Caffeine (BC HEADACHE PO) Take 1 packet by mouth 2 (two) times daily as needed (migraine).    [provider]  benzonatate (TESSALON) 100 MG capsule Take 1 capsule (100 mg total) by mouth every 8 (eight) hours. 09/16/18   Law, Waylan Boga, PA-C  fluticasone (FLONASE) 50 MCG/ACT nasal spray Place 2 sprays into both nostrils daily as needed for allergies or rhinitis. Patient not taking: Reported on 04/08/2018 01/18/18  Raeford RazorKohut, Stephen, MD  methocarbamol (ROBAXIN) 500 MG tablet Take 1 tablet (500 mg total) by mouth 2 (two) times daily. Patient not taking: Reported on 04/08/2018 01/31/18   Hedges, Tinnie GensJeffrey, PA-C  naproxen (NAPROSYN) 500 MG tablet Take 1 tablet (500 mg total) by mouth 2 (two) times daily. Patient not taking: Reported on 04/08/2018 01/31/18   Hedges, Tinnie GensJeffrey, PA-C  potassium chloride SA (K-DUR,KLOR-CON) 20 MEQ tablet Take 2 tablets (40 mEq total) by mouth 2 (two) times daily. Patient not taking:  Reported on 01/18/2018 12/10/17   Mesner, Barbara CowerJason, MD  sodium chloride (OCEAN) 0.65 % SOLN nasal spray Place 1 spray into both nostrils as needed for congestion. 12/14/18   Carlyle BasquesHernandez, Ana P, PA-C  SUMAtriptan (IMITREX) 6 MG/0.5ML SOLN injection Inject 0.5 mLs (6 mg total) into the skin once for 1 dose. May repeat in 2 hours if headache persists or recurs, up to a total of 2 doses within a 24 hour period 03/20/19 03/20/19  Robinson, [place] N, PA-C    Family History Family History  Problem Relation Age of Onset  . Heart disease Mother        Arrythmia  . Kidney disease Mother   . Hypertension Mother   . COPD Mother   . Diabetes Mother   . Lung cancer Mother   . Heart disease Father   . Crohn's disease Maternal Aunt   . Breast cancer Maternal Aunt        3440's    Social History Social History   Tobacco Use  . Smoking status: Current Some Day Smoker    Packs/day: 0.25    Years: 15.00    Pack years: 3.75    Types: Cigarettes  . Smokeless tobacco: Never Used  Substance Use Topics  . Alcohol use: No  . Drug use: No     Allergies   Hydrocodone-acetaminophen and Penicillins   Review of Systems Review of Systems  Eyes: Positive for photophobia. Negative for visual disturbance.  Gastrointestinal: Positive for nausea (resolved). Negative for vomiting.  Neurological: Positive for headaches.  All other systems reviewed and are negative.    Physical Exam Updated Vital Signs BP 111/85   Pulse 87   Temp 98 F (36.7 C) (Oral)   Resp 16   Wt 59 kg   LMP 03/13/2019   SpO2 100%   BMI 22.33 kg/m   Physical Exam Vitals signs and nursing note reviewed.  Constitutional:      Appearance: She is well-developed.  HENT:     Head: Normocephalic and atraumatic.     Comments: No tenderness to temples, no palpable cords Eyes:     Conjunctiva/sclera: Conjunctivae normal.  Cardiovascular:     Rate and Rhythm: Normal rate and regular rhythm.  Pulmonary:     Effort: Pulmonary effort is  normal. No respiratory distress.     Breath sounds: Normal breath sounds.  Abdominal:     General: Bowel sounds are normal.     Palpations: Abdomen is soft.     Tenderness: There is no abdominal tenderness.  Skin:    General: Skin is warm.  Neurological:     Mental Status: She is alert.     Comments: Mental Status:  Alert, oriented, thought content appropriate, able to give a coherent history. Speech fluent without evidence of aphasia. Able to follow 2 step commands without difficulty.  Cranial Nerves: grossly intact. PERRL, EOM normal. Motor:  Normal tone. 5/5 strength in upper and lower extremities bilaterally including strong and equal grip  strength and dorsiflexion/plantar flexion Sensory: grossly normal in all extremities.    Psychiatric:        Behavior: Behavior normal.      ED Treatments / Results  Labs (all labs ordered are listed, but only abnormal results are displayed) Labs Reviewed - No data to display  EKG None  Radiology No results found.  Procedures Procedures (including critical care time)  Medications Ordered in ED Medications  SUMAtriptan (IMITREX) injection 6 mg (6 mg Subcutaneous Given 03/20/19 1004)     Initial Impression / Assessment and Plan / ED Course  I have reviewed the triage vital signs and the nursing notes.  Pertinent labs & imaging results that were available during my care of the patient were reviewed by me and considered in my medical decision making (see chart for details).  Clinical Course as of Mar 19 1117  Nancy Fetter Mar 20, 2019  1108 Patient reevaluated, reports resolution in symptoms.  Requesting refill of her Imitrex prescription.   [JR]    Clinical Course User Index [JR] Robinson, [place] N, PA-C       Pt HA treated with home dose of imitrex sq and improved while in ED.  Presentation is like pts typical HA and non concerning for [facility], ICH, Meningitis, or temporal arteritis. Pt is afebrile with no focal neuro deficits, nuchal  rigidity, or change in vision. Pt is to follow up with PCP. Pt verbalizes understanding and is agreeable with plan to dc.   Discussed results, findings, treatment and follow up. Patient advised of return precautions. Patient verbalized understanding and agreed with plan.  Final Clinical Impressions(s) / ED Diagnoses   Final diagnoses:  Acute nonintractable headache, unspecified headache type    ED Discharge Orders         Ordered    SUMAtriptan (IMITREX) 6 MG/0.5ML SOLN injection   Once     03/20/19 1116           Robinson, [place] N, [place] 03/20/19 1118    Lajean Saver, MD 03/20/19 1332

## 2019-03-24 ENCOUNTER — Other Ambulatory Visit: Payer: Self-pay

## 2019-03-24 ENCOUNTER — Encounter (HOSPITAL_COMMUNITY): Payer: Self-pay

## 2019-03-24 ENCOUNTER — Encounter (HOSPITAL_COMMUNITY): Payer: Self-pay | Admitting: Emergency Medicine

## 2019-03-24 ENCOUNTER — Emergency Department (HOSPITAL_COMMUNITY)
Admission: EM | Admit: 2019-03-24 | Discharge: 2019-03-25 | Disposition: A | Discharge status: Home / Self Care | Payer: 59 | Attending: Emergency Medicine | Admitting: Emergency Medicine

## 2019-03-24 ENCOUNTER — Emergency Department (HOSPITAL_COMMUNITY)
Admission: EM | Admit: 2019-03-24 | Discharge: 2019-03-24 | Disposition: A | Discharge status: Home / Self Care | Payer: 59 | Attending: Emergency Medicine | Admitting: Emergency Medicine

## 2019-03-24 DIAGNOSIS — J45909 Unspecified asthma, uncomplicated: Secondary | ICD-10-CM | POA: Diagnosis not present

## 2019-03-24 DIAGNOSIS — R519 Headache, unspecified: Secondary | ICD-10-CM | POA: Insufficient documentation

## 2019-03-24 DIAGNOSIS — Z79899 Other long term (current) drug therapy: Secondary | ICD-10-CM | POA: Insufficient documentation

## 2019-03-24 DIAGNOSIS — Z5321 Procedure and treatment not carried out due to patient leaving prior to being seen by health care provider: Secondary | ICD-10-CM | POA: Diagnosis not present

## 2019-03-24 DIAGNOSIS — F17210 Nicotine dependence, cigarettes, uncomplicated: Secondary | ICD-10-CM | POA: Diagnosis not present

## 2019-03-24 DIAGNOSIS — R110 Nausea: Secondary | ICD-10-CM | POA: Diagnosis not present

## 2019-03-24 MED ORDER — SODIUM CHLORIDE 0.9 % IV BOLUS: 1000.0000 mL | Freq: Once | INTRAVENOUS | Status: DC

## 2019-03-24 MED ORDER — SUMATRIPTAN SUCCINATE 6 MG/0.5ML ~~LOC~~ SOLN: 6.0000 mg | Freq: Once | SUBCUTANEOUS | 0 refills | Status: DC

## 2019-03-24 NOTE — ED Triage Notes (Signed)
Pt complains of a headache that started at 2200, no trigger, hx of the same

## 2019-03-24 NOTE — Discharge Instructions (Signed)
REturn here as needed. Follow up with your doctor. °

## 2019-03-24 NOTE — ED Notes (Signed)
No answer for vital signs recheck in the lobby

## 2019-03-24 NOTE — ED Notes (Signed)
Patient refusing anything IV at this time. States that she will ask provider for IM shot instead.

## 2019-03-24 NOTE — ED Triage Notes (Signed)
Pt with migraine and cluster headaches since yesterday with nausea.. She has used BC powders and Excedrin without relieve. NAD at triage. VSS

## 2019-03-25 ENCOUNTER — Other Ambulatory Visit: Payer: Self-pay

## 2019-03-25 ENCOUNTER — Encounter (HOSPITAL_BASED_OUTPATIENT_CLINIC_OR_DEPARTMENT_OTHER): Payer: Self-pay | Admitting: Emergency Medicine

## 2019-03-25 ENCOUNTER — Emergency Department (HOSPITAL_BASED_OUTPATIENT_CLINIC_OR_DEPARTMENT_OTHER)
Admission: EM | Admit: 2019-03-25 | Discharge: 2019-03-25 | Disposition: A | Discharge status: Home / Self Care | Payer: 59 | Source: Home / Self Care | Attending: Emergency Medicine | Admitting: Emergency Medicine

## 2019-03-25 ENCOUNTER — Emergency Department (HOSPITAL_BASED_OUTPATIENT_CLINIC_OR_DEPARTMENT_OTHER): Payer: 59

## 2019-03-25 DIAGNOSIS — Z880 Allergy status to penicillin: Secondary | ICD-10-CM | POA: Insufficient documentation

## 2019-03-25 DIAGNOSIS — Z885 Allergy status to narcotic agent status: Secondary | ICD-10-CM | POA: Insufficient documentation

## 2019-03-25 DIAGNOSIS — F17210 Nicotine dependence, cigarettes, uncomplicated: Secondary | ICD-10-CM | POA: Insufficient documentation

## 2019-03-25 DIAGNOSIS — G4489 Other headache syndrome: Secondary | ICD-10-CM | POA: Insufficient documentation

## 2019-03-25 DIAGNOSIS — R519 Headache, unspecified: Secondary | ICD-10-CM

## 2019-03-25 DIAGNOSIS — Z79899 Other long term (current) drug therapy: Secondary | ICD-10-CM | POA: Insufficient documentation

## 2019-03-25 DIAGNOSIS — J45909 Unspecified asthma, uncomplicated: Secondary | ICD-10-CM | POA: Insufficient documentation

## 2019-03-25 LAB — PREGNANCY, URINE: Preg Test, Ur: NEGATIVE

## 2019-03-25 MED ORDER — ACETAMINOPHEN 500 MG PO TABS
1000.0000 mg | ORAL_TABLET | Freq: Once | ORAL | Status: AC
  Administered 2019-03-25: 1000 mg via ORAL
  Filled 2019-03-25: qty 2

## 2019-03-25 MED ORDER — SUMATRIPTAN SUCCINATE 6 MG/0.5ML ~~LOC~~ SOLN
6.0000 mg | Freq: Once | SUBCUTANEOUS | Status: AC
  Administered 2019-03-25: 6 mg via SUBCUTANEOUS
  Filled 2019-03-25: qty 0.5

## 2019-03-25 MED ORDER — NAPROXEN 250 MG PO TABS
500.0000 mg | ORAL_TABLET | Freq: Once | ORAL | Status: AC
  Administered 2019-03-25: 02:00:00 500 mg via ORAL
  Filled 2019-03-25: qty 2

## 2019-03-25 NOTE — ED Provider Notes (Addendum)
[facility] EMERGENCY DEPARTMENT Provider Note   CSN: [id number] Arrival date & time: 03/25/19  0119     History   Chief Complaint Chief Complaint  Patient presents with  . Headache    HPI Jenny Evans is a 48 y.o. female.     The history is provided by the patient.  Headache Pain location:  L temporal Quality:  Sharp Radiates to:  Does not radiate Severity currently:  10/10 Severity at highest:  10/10 Onset quality:  Sudden Duration:  1 day Timing:  Constant Progression:  Unchanged Chronicity:  Recurrent Similar to prior headaches: yes   Context: not activity, not exposure to bright light, not caffeine, not coughing, not defecating, not eating, not stress, not exposure to cold air, not intercourse, not loud noise and not straining   Relieved by:  Nothing Worsened by:  Nothing Ineffective treatments:  None tried Associated symptoms: no abdominal pain, no back pain, no blurred vision, no congestion, no cough, no diarrhea, no dizziness, no drainage, no ear pain, no eye pain, no facial pain, no fatigue, no fever, no focal weakness, no hearing loss, no loss of balance, no myalgias, no nausea, no near-syncope, no neck pain, no neck stiffness, no numbness, no paresthesias, no photophobia, no seizures, no sinus pressure, no sore throat, no swollen glands, no syncope, no tingling, no URI, no visual change, no vomiting and no weakness   Risk factors: no anger   Patient with migraines and cluster HA and chronic pain who has 3 HA a week presents with HA.  No f/c/r. No neck pain or stiffness. No changes in vision or speech.  No confusion.    Past Medical History:  Diagnosis Date  . Anemia   . Asthma   . Blood transfusion without reported diagnosis   . Chronic pain   . GERD (gastroesophageal reflux disease)   . Migraine   . Sickle cell trait ([place])   . Ulcer     Patient Active Problem List   Diagnosis Date Noted  . Sickle cell trait ([place]) 06/14/2017  .  Symptomatic anemia 06/14/2017  . Blood loss anemia 11/12/2015  . Melena 11/12/2015  . Nonspecific abnormal electrocardiogram (ECG) (EKG) 04/03/2015  . Palpitations 04/03/2015  . Neck pain 04/03/2015  . Colitis 07/10/2014  . Absolute anemia   . Post-operative state 08/26/2013  . Rectocele 08/26/2013  . Anal or rectal pain 07/29/2013  . Headache(784.0) 02/27/2012  . ACUTE BRONCHITIS 03/25/2010  . OBESITY 09/06/2009  . INSOMNIA 01/23/2009  . HYPOKALEMIA 11/01/2008  . DYSPEPSIA&OTHER [facility] DISORDERS FUNCTION STOMACH 06/07/2008  . ALLERGIC RHINITIS 03/23/2008  . CONSTIPATION 01/20/2008  . Shortness of breath 08/27/2007  . TOBACCO ABUSE 03/09/2007  . Migraine 03/09/2007  . ABSCESS, TOOTH 03/09/2007  . GERD (gastroesophageal reflux disease) 03/09/2007  . Migraine variant 02/25/2007  . ECZEMA 02/25/2007    Past Surgical History:  Procedure Laterality Date  . ESOPHAGOGASTRODUODENOSCOPY (EGD) WITH PROPOFOL N/A 04/04/2013   Procedure: ESOPHAGOGASTRODUODENOSCOPY (EGD) WITH PROPOFOL;  Surgeon: Arta Silence, MD;  Location: [facility];  Service: Endoscopy;  Laterality: N/A;  . EXAMINATION UNDER ANESTHESIA N/A 08/10/2013   Procedure: RECTAL EXAM UNDER ANESTHESIA;  Surgeon: Joyice Faster. Cornett, MD;  Location: [place];  Service: General;  Laterality: N/A;  ligation  . HEMORRHOID SURGERY    . MYRINGOTOMY     age 67     OB History   No obstetric history on file.      Home Medications    Prior  to Admission medications   Medication Sig Start Date End Date Taking? Authorizing Provider  albuterol (PROVENTIL HFA;VENTOLIN HFA) 108 (90 Base) MCG/ACT inhaler Inhale 1-2 puffs into the lungs every 6 (six) hours as needed for wheezing or shortness of breath. Patient not taking: Reported on 03/24/2019 04/08/18   Long, [name], MD  Aspirin-Salicylamide-Caffeine [facility] HEADACHE PO) Take 1 packet by mouth 2 (two) times daily as needed (migraine).    [provider]   benzonatate (TESSALON) 100 MG capsule Take 1 capsule (100 mg total) by mouth every 8 (eight) hours. Patient not taking: Reported on 03/24/2019 09/16/18   Emi Holes, PA-C  fluticasone [facility]) 50 MCG/ACT nasal spray Place 2 sprays into both nostrils daily as needed for allergies or rhinitis. Patient not taking: Reported on 04/08/2018 01/18/18   Raeford Razor, MD  Iron-FA-B Cmp-C-Biot-Probiotic (FUSION PLUS) CAPS Take 1 capsule by mouth daily. 03/10/19   [provider]  methocarbamol (ROBAXIN) 500 MG tablet Take 1 tablet (500 mg total) by mouth 2 (two) times daily. Patient not taking: Reported on 04/08/2018 01/31/18   Hedges, Tinnie Gens, PA-C  naproxen (NAPROSYN) 500 MG tablet Take 1 tablet (500 mg total) by mouth 2 (two) times daily. Patient not taking: Reported on 04/08/2018 01/31/18   Hedges, Tinnie Gens, PA-C  pantoprazole (PROTONIX) 40 MG tablet Take 40 mg by mouth daily. 03/10/19   [provider]  potassium chloride SA (K-DUR,KLOR-CON) 20 MEQ tablet Take 2 tablets (40 mEq total) by mouth 2 (two) times daily. Patient not taking: Reported on 01/18/2018 12/10/17   Mesner, Barbara Cower, MD  sodium chloride (OCEAN) 0.65 % SOLN nasal spray Place 1 spray into both nostrils as needed for congestion. Patient not taking: Reported on 03/24/2019 12/14/18   Carlyle Basques P, PA-C  SUMAtriptan (IMITREX) 6 MG/0.5ML SOLN injection Inject 0.5 mLs (6 mg total) into the skin once for 1 dose. May repeat in 2 hours if headache persists or recurs, up to a total of 2 doses within a 24 hour period 03/24/19 03/24/19  Charlestine Night, PA-C    Family History Family History  Problem Relation Age of Onset  . Heart disease Mother        Arrythmia  . Kidney disease Mother   . Hypertension Mother   . COPD Mother   . Diabetes Mother   . Lung cancer Mother   . Heart disease Father   . Crohn's disease Maternal Aunt   . Breast cancer Maternal Aunt        38's    Social History Social History   Tobacco Use   . Smoking status: Current Some Day Smoker    Packs/day: 0.25    Years: 15.00    Pack years: 3.75    Types: Cigarettes  . Smokeless tobacco: Never Used  Substance Use Topics  . Alcohol use: No  . Drug use: No     Allergies   Hydrocodone-acetaminophen and Penicillins   Review of Systems Review of Systems  Constitutional: Negative for fatigue and fever.  HENT: Negative for congestion, ear pain, hearing loss, postnasal drip, sinus pressure and sore throat.   Eyes: Negative for blurred vision, photophobia, pain and visual disturbance.  Respiratory: Negative for cough.   Cardiovascular: Negative for syncope and near-syncope.  Gastrointestinal: Negative for abdominal pain, diarrhea, nausea and vomiting.  Genitourinary: Negative for difficulty urinating.  Musculoskeletal: Negative for back pain, myalgias, neck pain and neck stiffness.  Skin: Negative for rash.  Neurological: Positive for headaches. Negative for dizziness, tremors, focal  weakness, seizures, syncope, facial asymmetry, speech difficulty, weakness, light-headedness, numbness, paresthesias and loss of balance.  All other systems reviewed and are negative.    Physical Exam Updated Vital Signs BP 119/81 (BP Location: Right Arm)   Pulse 78   Temp 97.9 F (36.6 C) (Oral)   Resp 16   Ht 5\' 4"  (1.626 m)   Wt 60.8 kg   LMP 03/13/2019   SpO2 100%   BMI 23.00 kg/m   Physical Exam Vitals signs and nursing note reviewed.  Constitutional:      General: She is not in acute distress.    Appearance: She is normal weight.  HENT:     Head: Normocephalic and atraumatic.     Comments: No proptosis, intact cognition     Right Ear: Tympanic membrane normal.     Left Ear: Tympanic membrane normal.     Nose: Nose normal.     Mouth/Throat:     Mouth: Mucous membranes are moist.     Pharynx: Oropharynx is clear.  Eyes:     Extraocular Movements: Extraocular movements intact.     Conjunctiva/sclera: Conjunctivae normal.      Pupils: Pupils are equal, round, and reactive to light.  Neck:     Musculoskeletal: Normal range of motion and neck supple. No neck rigidity.  Cardiovascular:     Rate and Rhythm: Normal rate and regular rhythm.     Pulses: Normal pulses.     Heart sounds: Normal heart sounds.  Pulmonary:     Effort: Pulmonary effort is normal.     Breath sounds: Normal breath sounds.  Abdominal:     General: Abdomen is flat. Bowel sounds are normal.     Tenderness: There is no abdominal tenderness. There is no guarding or rebound.  Musculoskeletal: Normal range of motion.  Lymphadenopathy:     Cervical: No cervical adenopathy.  Skin:    General: Skin is warm and dry.  Neurological:     General: No focal deficit present.     Mental Status: She is alert and oriented to person, place, and time.     Cranial Nerves: No cranial nerve deficit.     Gait: Gait normal.     Deep Tendon Reflexes: Reflexes normal.  Psychiatric:        Mood and Affect: Mood normal.        Behavior: Behavior normal.      ED Treatments / Results  Labs (all labs ordered are listed, but only abnormal results are displayed) Labs Reviewed  PREGNANCY, URINE    EKG None  Radiology Results for orders placed or performed during the hospital encounter of 03/25/19  Pregnancy, urine  Result Value Ref Range   Preg Test, Ur NEGATIVE NEGATIVE   Ct Head Wo Contrast  Result Date: 03/25/2019 CLINICAL DATA:  Headache EXAM: CT HEAD WITHOUT CONTRAST TECHNIQUE: Contiguous axial images were obtained from the base of the skull through the vertex without intravenous contrast. COMPARISON:  CT brain 11/10/2013 FINDINGS: Brain: No evidence of acute infarction, hemorrhage, hydrocephalus, extra-axial collection or mass lesion/mass effect. Vascular: No hyperdense vessel or unexpected calcification. Skull: Normal. Negative for fracture or focal lesion. Sinuses/Orbits: No acute finding. Other: None IMPRESSION: Negative non contrasted CT appearance  of the brain Electronically Signed   By: 11/12/2013 M.D.   On: 03/25/2019 02:05    Procedures Procedures (including critical care time)  Medications Ordered in ED Medications  acetaminophen (TYLENOL) tablet 1,000 mg (has no administration in time range)  naproxen (NAPROSYN) tablet 500 mg (has no administration in time range)  SUMAtriptan (IMITREX) injection 6 mg (6 mg Subcutaneous Given 03/25/19 0139)     Given repeat visit for same have CT head.  This is normal.  I suspect the patient has chronic migraines she was not able to fill the imitrex and I suspect that is why she returned.  No signs of meningitis or encephalitis.  No indication for LP.    Jenny Evans was evaluated in Emergency Department on 03/25/2019 for the symptoms described in the history of present illness. She was evaluated in the context of the global COVID-19 pandemic, which necessitated consideration that the patient might be at risk for infection with the SARS-CoV-2 virus that causes COVID-19. Institutional protocols and algorithms that pertain to the evaluation of patients at risk for COVID-19 are in a state of rapid change based on information released by regulatory bodies including the CDC and federal and state organizations. These policies and algorithms were followed during the patient's care in the ED.   Final Clinical Impressions(s) / ED Diagnoses   Return for intractable cough, coughing up blood,fevers >100.4 unrelieved by medication, shortness of breath, intractable vomiting, chest pain, shortness of breath, weakness,numbness, changes in speech, facial asymmetry,abdominal pain, passing out,Inability to tolerate liquids or food, cough, altered mental status or any concerns. No signs of systemic illness or infection. The patient is nontoxic-appearing on exam and vital signs are within normal limits.   I have reviewed the triage vital signs and the nursing notes. Pertinent labs &imaging results that  were available during my care of the patient were reviewed by me and considered in my medical decision making (see chart for details).After history, exam, and medical workup I feel the patient has beenappropriately medically screened and is safe for discharge home. Pertinent diagnoses were discussed with the patient. Patient was given return precautions.    Gisell Buehrle, MD 03/25/19 [id number]    Cy BlamerPalumbo, Senai Ramnath, MD 03/25/19 [id number]

## 2019-03-25 NOTE — ED Triage Notes (Signed)
pt c/o headache since yesterday. Pt states hx of cluster headaches.

## 2019-03-25 NOTE — ED Notes (Signed)
Pt called in the lobby for vitals recheck with no answer 

## 2019-03-25 NOTE — ED Notes (Signed)
Patient transported to CT 

## 2019-03-29 NOTE — ED Provider Notes (Signed)
[facility]-EMERGENCY DEPT Provider Note   CSN: [id number] Arrival date & time: 03/24/19  [id number]     History   Chief Complaint No chief complaint on file.   HPI Jenny Evans is a 48 y.o. female.     HPI Patient presents to the emergency department with migraine headache.  The patient states that she needs to be discharged as her ride is here.  The patient states she only wants a prescription for Imitrex.  Patient states that this headache is been ongoing for several days.  She states that she is been out of her Imitrex and will not see her doctor for quite a while.  The patient denies chest pain, shortness of breath, ,blurred vision, neck pain, fever, cough, weakness, numbness, dizziness, anorexia, edema, abdominal pain, nausea, vomiting, diarrhea, rash, back pain, dysuria, hematemesis, bloody stool, near syncope, or syncope. Past Medical History:  Diagnosis Date  . Anemia   . Asthma   . Blood transfusion without reported diagnosis   . Chronic pain   . GERD (gastroesophageal reflux disease)   . Migraine   . Sickle cell trait ([facility])   . Ulcer     Patient Active Problem List   Diagnosis Date Noted  . Sickle cell trait ([facility]) 06/14/2017  . Symptomatic anemia 06/14/2017  . Blood loss anemia 11/12/2015  . Melena 11/12/2015  . Nonspecific abnormal electrocardiogram (ECG) (EKG) 04/03/2015  . Palpitations 04/03/2015  . Neck pain 04/03/2015  . Colitis 07/10/2014  . Absolute anemia   . Post-operative state 08/26/2013  . Rectocele 08/26/2013  . Anal or rectal pain 07/29/2013  . Headache(784.0) 02/27/2012  . ACUTE BRONCHITIS 03/25/2010  . OBESITY 09/06/2009  . INSOMNIA 01/23/2009  . HYPOKALEMIA 11/01/2008  . [facility] 06/07/2008  . ALLERGIC RHINITIS 03/23/2008  . CONSTIPATION 01/20/2008  . Shortness of breath 08/27/2007  . TOBACCO ABUSE 03/09/2007  . Migraine 03/09/2007  . ABSCESS, TOOTH 03/09/2007  . GERD  (gastroesophageal reflux disease) 03/09/2007  . Migraine variant 02/25/2007  . ECZEMA 02/25/2007    Past Surgical History:  Procedure Laterality Date  . ESOPHAGOGASTRODUODENOSCOPY (EGD) WITH PROPOFOL N/A 04/04/2013   Procedure: ESOPHAGOGASTRODUODENOSCOPY (EGD) WITH PROPOFOL;  Surgeon: Willis ModenaWilliam Outlaw, MD;  Location: [facility];  Service: Endoscopy;  Laterality: N/A;  . EXAMINATION UNDER ANESTHESIA N/A 08/10/2013   Procedure: RECTAL EXAM UNDER ANESTHESIA;  Surgeon: Clovis Puhomas A. Cornett, MD;  Location: [facility];  Service: General;  Laterality: N/A;  ligation  . HEMORRHOID SURGERY    . MYRINGOTOMY     age 48     OB History   No obstetric history on file.      Home Medications    Prior to Admission medications   Medication Sig Start Date End Date Taking? Authorizing Provider  Aspirin-Salicylamide-Caffeine (BC HEADACHE PO) Take 1 packet by mouth 2 (two) times daily as needed (migraine).   Yes [provider]  Iron-FA-B Cmp-C-Biot-Probiotic (FUSION PLUS) CAPS Take 1 capsule by mouth daily. 03/10/19  Yes [provider]  pantoprazole (PROTONIX) 40 MG tablet Take 40 mg by mouth daily. 03/10/19  Yes [provider]  albuterol (PROVENTIL HFA;VENTOLIN HFA) 108 (90 Base) MCG/ACT inhaler Inhale 1-2 puffs into the lungs every 6 (six) hours as needed for wheezing or shortness of breath. Patient not taking: Reported on 03/24/2019 04/08/18   Long, Arlyss RepressJoshua G, MD  benzonatate (TESSALON) 100 MG capsule Take 1 capsule (100 mg total) by mouth every 8 (eight) hours. Patient not taking:  Reported on 03/24/2019 09/16/18   Frederica Kuster, PA-C  fluticasone (FLONASE) 50 MCG/ACT nasal spray Place 2 sprays into both nostrils daily as needed for allergies or rhinitis. Patient not taking: Reported on 04/08/2018 01/18/18   Virgel Manifold, MD  methocarbamol (ROBAXIN) 500 MG tablet Take 1 tablet (500 mg total) by mouth 2 (two) times daily. Patient not taking: Reported on 04/08/2018  01/31/18   Hedges, Dellis Filbert, PA-C  naproxen (NAPROSYN) 500 MG tablet Take 1 tablet (500 mg total) by mouth 2 (two) times daily. Patient not taking: Reported on 04/08/2018 01/31/18   Hedges, Dellis Filbert, PA-C  potassium chloride SA (K-DUR,KLOR-CON) 20 MEQ tablet Take 2 tablets (40 mEq total) by mouth 2 (two) times daily. Patient not taking: Reported on 01/18/2018 12/10/17   Mesner, Corene Cornea, MD  sodium chloride (OCEAN) 0.65 % SOLN nasal spray Place 1 spray into both nostrils as needed for congestion. Patient not taking: Reported on 03/24/2019 12/14/18   Darlin Drop P, PA-C  SUMAtriptan (IMITREX) 6 MG/0.5ML SOLN injection Inject 0.5 mLs (6 mg total) into the skin once for 1 dose. May repeat in 2 hours if headache persists or recurs, up to a total of 2 doses within a 24 hour period 03/24/19 03/24/19  Dalia Heading, PA-C    Family History Family History  Problem Relation Age of Onset  . Heart disease Mother        Arrythmia  . Kidney disease Mother   . Hypertension Mother   . COPD Mother   . Diabetes Mother   . Lung cancer Mother   . Heart disease Father   . Crohn's disease Maternal Aunt   . Breast cancer Maternal Aunt        40's    Social History Social History   Tobacco Use  . Smoking status: Current Some Day Smoker    Packs/day: 0.25    Years: 15.00    Pack years: 3.75    Types: Cigarettes  . Smokeless tobacco: Never Used  Substance Use Topics  . Alcohol use: No  . Drug use: No     Allergies   Hydrocodone-acetaminophen and Penicillins   Review of Systems Review of Systems All other systems negative except as documented in the HPI. All pertinent positives and negatives as reviewed in the HPI.  Physical Exam Updated Vital Signs BP 120/76   Pulse 73   Temp 98.4 F (36.9 C) (Oral)   Resp 16   LMP 03/13/2019   SpO2 99%   Physical Exam Vitals signs and nursing note reviewed.  Constitutional:      General: She is not in acute distress.    Appearance: She is  well-developed.  HENT:     Head: Normocephalic and atraumatic.  Eyes:     Pupils: Pupils are equal, round, and reactive to light.  Pulmonary:     Effort: Pulmonary effort is normal.  Skin:    General: Skin is warm and dry.  Neurological:     General: No focal deficit present.     Mental Status: She is alert and oriented to person, place, and time.     Sensory: No sensory deficit.     Motor: No weakness.     Coordination: Coordination normal.     Gait: Gait normal.     Deep Tendon Reflexes: Reflexes normal.      ED Treatments / Results  Labs (all labs ordered are listed, but only abnormal results are displayed) Labs Reviewed - No data to display  EKG  None  Radiology No results found.  Procedures Procedures (including critical care time)  Medications Ordered in ED Medications - No data to display   Initial Impression / Assessment and Plan / ED Course  I have reviewed the triage vital signs and the nursing notes.  Pertinent labs & imaging results that were available during my care of the patient were reviewed by me and considered in my medical decision making (see chart for details).        Will be discharged prescription for Imitrex.  Have advised her to return here as needed.  Patient agrees the plan and all questions are answered.  Patient did not want to stay for any treatment with medication to the emergency department. Final Clinical Impressions(s) / ED Diagnoses   Final diagnoses:  None    ED Discharge Orders         Ordered    SUMAtriptan (IMITREX) 6 MG/0.5ML SOLN injection   Once     03/24/19 0806           Charlestine Night, PA-C 03/29/19 1605    Alvira Monday, MD 04/02/19 [phone]

## 2019-03-30 ENCOUNTER — Encounter (HOSPITAL_COMMUNITY): Payer: Self-pay | Admitting: *Deleted

## 2019-03-30 ENCOUNTER — Other Ambulatory Visit: Payer: Self-pay

## 2019-03-30 DIAGNOSIS — Z5321 Procedure and treatment not carried out due to patient leaving prior to being seen by health care provider: Secondary | ICD-10-CM | POA: Diagnosis not present

## 2019-03-30 DIAGNOSIS — R519 Headache, unspecified: Secondary | ICD-10-CM | POA: Diagnosis present

## 2019-03-30 NOTE — ED Triage Notes (Signed)
Pt reporting headache, left sided facial pain and earache. Headaches for several days. Pt was seen on the 9th for the same, says not any better.

## 2019-03-31 ENCOUNTER — Encounter (HOSPITAL_COMMUNITY): Payer: Self-pay

## 2019-03-31 ENCOUNTER — Emergency Department (HOSPITAL_COMMUNITY)
Admission: EM | Admit: 2019-03-31 | Discharge: 2019-03-31 | Disposition: A | Discharge status: Home / Self Care | Payer: 59 | Source: Home / Self Care | Attending: Emergency Medicine | Admitting: Emergency Medicine

## 2019-03-31 ENCOUNTER — Other Ambulatory Visit: Payer: Self-pay

## 2019-03-31 ENCOUNTER — Emergency Department (HOSPITAL_COMMUNITY)
Admission: EM | Admit: 2019-03-31 | Discharge: 2019-03-31 | Disposition: A | Discharge status: Home / Self Care | Payer: 59 | Attending: Emergency Medicine | Admitting: Emergency Medicine

## 2019-03-31 DIAGNOSIS — R591 Generalized enlarged lymph nodes: Secondary | ICD-10-CM

## 2019-03-31 DIAGNOSIS — R519 Headache, unspecified: Secondary | ICD-10-CM

## 2019-03-31 DIAGNOSIS — J45909 Unspecified asthma, uncomplicated: Secondary | ICD-10-CM | POA: Insufficient documentation

## 2019-03-31 DIAGNOSIS — F17210 Nicotine dependence, cigarettes, uncomplicated: Secondary | ICD-10-CM | POA: Insufficient documentation

## 2019-03-31 MED ORDER — SUMATRIPTAN SUCCINATE 6 MG/0.5ML ~~LOC~~ SOLN
6.0000 mg | Freq: Once | SUBCUTANEOUS | Status: AC
  Administered 2019-03-31: 6 mg via SUBCUTANEOUS
  Filled 2019-03-31: qty 0.5

## 2019-03-31 MED ORDER — CEPHALEXIN 500 MG PO CAPS: 500.0000 mg | ORAL_CAPSULE | Freq: Four times a day (QID) | ORAL | 0 refills | Status: DC

## 2019-03-31 MED ORDER — SUMATRIPTAN SUCCINATE 6 MG/0.5ML ~~LOC~~ SOLN: 6.0000 mg | SUBCUTANEOUS | 0 refills | Status: DC | PRN

## 2019-03-31 NOTE — ED Notes (Signed)
Pt seen leaving lobby.  LWBS.

## 2019-03-31 NOTE — ED Triage Notes (Signed)
Patient c/o swelling to the left neck and a headache  x 2 days

## 2019-03-31 NOTE — ED Provider Notes (Signed)
[facility]-EMERGENCY DEPT Provider Note   CSN: [id number] Arrival date & time: 03/31/19  0857     History   Chief Complaint Chief Complaint  Patient presents with  . Lymphadenopathy  . Headache    HPI Jenny Evans is a 48 y.o. female.     Patient is a 48 year old female with history of migraines, GERD, asthma, anemia.  She presents today for evaluation of headache and pain to the left side of her neck.  Patient describes a swelling to her left neck just under her jaw for the past week.  She states that this is triggering her migraines.  She was seen several days ago for the same complaint.  Head CT was performed and was unremarkable.  Patient returns today stating she is not any better.  The history is provided by the patient.  Headache Pain location:  L parietal and L temporal Quality:  Stabbing Radiates to:  Does not radiate Duration:  1 week Timing:  Constant Progression:  Worsening Relieved by:  Nothing Worsened by:  Nothing Ineffective treatments:  None tried   Past Medical History:  Diagnosis Date  . Anemia   . Asthma   . Blood transfusion without reported diagnosis   . Chronic pain   . GERD (gastroesophageal reflux disease)   . Migraine   . Sickle cell trait (HCC)   . Ulcer     Patient Active Problem List   Diagnosis Date Noted  . Sickle cell trait (HCC) 06/14/2017  . Symptomatic anemia 06/14/2017  . Blood loss anemia 11/12/2015  . Melena 11/12/2015  . Nonspecific abnormal electrocardiogram (ECG) (EKG) 04/03/2015  . Palpitations 04/03/2015  . Neck pain 04/03/2015  . Colitis 07/10/2014  . Absolute anemia   . Post-operative state 08/26/2013  . Rectocele 08/26/2013  . Anal or rectal pain 07/29/2013  . Headache(784.0) 02/27/2012  . ACUTE BRONCHITIS 03/25/2010  . OBESITY 09/06/2009  . INSOMNIA 01/23/2009  . HYPOKALEMIA 11/01/2008  . DYSPEPSIA&OTHER [facility] DISORDERS FUNCTION STOMACH 06/07/2008  . ALLERGIC RHINITIS 03/23/2008   . CONSTIPATION 01/20/2008  . Shortness of breath 08/27/2007  . TOBACCO ABUSE 03/09/2007  . Migraine 03/09/2007  . ABSCESS, TOOTH 03/09/2007  . GERD (gastroesophageal reflux disease) 03/09/2007  . Migraine variant 02/25/2007  . ECZEMA 02/25/2007    Past Surgical History:  Procedure Laterality Date  . ESOPHAGOGASTRODUODENOSCOPY (EGD) WITH PROPOFOL N/A 04/04/2013   Procedure: ESOPHAGOGASTRODUODENOSCOPY (EGD) WITH PROPOFOL;  Surgeon: Willis Modena, MD;  Location: [facility];  Service: Endoscopy;  Laterality: N/A;  . EXAMINATION UNDER ANESTHESIA N/A 08/10/2013   Procedure: RECTAL EXAM UNDER ANESTHESIA;  Surgeon: Clovis Pu. Cornett, MD;  Location: [facility];  Service: General;  Laterality: N/A;  ligation  . HEMORRHOID SURGERY    . MYRINGOTOMY     age 28     OB History   No obstetric history on file.      Home Medications    Prior to Admission medications   Medication Sig Start Date End Date Taking? Authorizing Provider  SUMAtriptan 6 MG/0.5ML SOAJ Inject 6 mg into the skin as needed for migraine. 03/25/19  Yes [provider]  albuterol (PROVENTIL HFA;VENTOLIN HFA) 108 (90 Base) MCG/ACT inhaler Inhale 1-2 puffs into the lungs every 6 (six) hours as needed for wheezing or shortness of breath. Patient not taking: Reported on 03/24/2019 04/08/18   Long, [name], MD  benzonatate (TESSALON) 100 MG capsule Take 1 capsule (100 mg total) by mouth every 8 (eight) hours. Patient not  taking: Reported on 03/24/2019 09/16/18   Frederica Kuster, PA-C  fluticasone [facility]) 50 MCG/ACT nasal spray Place 2 sprays into both nostrils daily as needed for allergies or rhinitis. Patient not taking: Reported on 04/08/2018 01/18/18   Virgel Manifold, MD  methocarbamol (ROBAXIN) 500 MG tablet Take 1 tablet (500 mg total) by mouth 2 (two) times daily. Patient not taking: Reported on 04/08/2018 01/31/18   Hedges, Dellis Filbert, PA-C  naproxen (NAPROSYN) 500 MG tablet Take 1 tablet (500 mg total)  by mouth 2 (two) times daily. Patient not taking: Reported on 04/08/2018 01/31/18   Hedges, Dellis Filbert, PA-C  potassium chloride SA (K-DUR,KLOR-CON) 20 MEQ tablet Take 2 tablets (40 mEq total) by mouth 2 (two) times daily. Patient not taking: Reported on 01/18/2018 12/10/17   Mesner, Corene Cornea, MD  sodium chloride (OCEAN) 0.65 % SOLN nasal spray Place 1 spray into both nostrils as needed for congestion. Patient not taking: Reported on 03/24/2019 12/14/18   Darlin Drop P, PA-C  SUMAtriptan (IMITREX) 6 MG/0.5ML SOLN injection Inject 0.5 mLs (6 mg total) into the skin once for 1 dose. May repeat in 2 hours if headache persists or recurs, up to a total of 2 doses within a 24 hour period Patient not taking: Reported on 03/31/2019 03/24/19 03/24/19  Dalia Heading, PA-C    Family History Family History  Problem Relation Age of Onset  . Heart disease Mother        Arrythmia  . Kidney disease Mother   . Hypertension Mother   . COPD Mother   . Diabetes Mother   . Lung cancer Mother   . Heart disease Father   . Crohn's disease Maternal Aunt   . Breast cancer Maternal Aunt        40's    Social History Social History   Tobacco Use  . Smoking status: Current Some Day Smoker    Packs/day: 0.25    Years: 15.00    Pack years: 3.75    Types: Cigarettes  . Smokeless tobacco: Never Used  Substance Use Topics  . Alcohol use: No  . Drug use: No     Allergies   Hydrocodone-acetaminophen and Penicillins   Review of Systems Review of Systems  Neurological: Positive for headaches.  All other systems reviewed and are negative.    Physical Exam Updated Vital Signs BP 121/72 (BP Location: Left Arm)   Pulse 68   Temp 99.2 F (37.3 C) (Oral)   Resp 14   Ht 5\' 4"  (1.626 m)   Wt 63 kg   LMP 03/13/2019   SpO2 100%   BMI 23.86 kg/m   Physical Exam Vitals signs and nursing note reviewed.  Constitutional:      General: She is not in acute distress.    Appearance: She is well-developed.  She is not ill-appearing.  HENT:     Head: Normocephalic and atraumatic.     Mouth/Throat:     Mouth: Mucous membranes are moist.     Pharynx: Oropharynx is clear.     Comments: Patient has multiple dental caries, however no sign of abscess, intraoral swelling, or other abnormality.  There is no stridor. Neck:     Musculoskeletal: Normal range of motion and neck supple. No neck rigidity.     Comments: There is tenderness to palpation to the left neck just below the left mandible.  I am unable to palpate any other significant swelling/lymphadenopathy. Pulmonary:     Effort: Pulmonary effort is normal.  Breath sounds: No stridor.  Skin:    General: Skin is warm and dry.  Neurological:     Mental Status: She is alert.      ED Treatments / Results  Labs (all labs ordered are listed, but only abnormal results are displayed) Labs Reviewed - No data to display  EKG None  Radiology No results found.  Procedures Procedures (including critical care time)  Medications Ordered in ED Medications  SUMAtriptan (IMITREX) injection 6 mg (has no administration in time range)     Initial Impression / Assessment and Plan / ED Course  I have reviewed the triage vital signs and the nursing notes.  Pertinent labs & imaging results that were available during my care of the patient were reviewed by me and considered in my medical decision making (see chart for details).  Patient presents with complaints of pain to the left side of her neck and headache.  She had a head CT performed 5 days ago for similar complaints which was unremarkable.  She returns today stating she is feeling no better.  There is tenderness to the left lateral neck just under her mandible that could represent an inflamed lymph node.  At this point, patient will be treated with Keflex.  She is also out of her Imitrex injections.  This will be refilled and she is to follow-up with her primary doctor.  Final Clinical  Impressions(s) / ED Diagnoses   Final diagnoses:  None    ED Discharge Orders    None       Geoffery Lyonselo, Sun Kihn, MD 03/31/19 1129

## 2019-03-31 NOTE — Discharge Instructions (Addendum)
Keflex as prescribed.  Ibuprofen 600 mg every 6 hours as needed for pain.  Imitrex as needed for pain not relieved with ibuprofen.  Follow-up with your primary doctor if symptoms or not improving in the next few days.

## 2019-04-02 ENCOUNTER — Other Ambulatory Visit: Payer: Self-pay

## 2019-04-02 ENCOUNTER — Emergency Department (HOSPITAL_COMMUNITY): Payer: 59

## 2019-04-02 ENCOUNTER — Emergency Department (HOSPITAL_COMMUNITY)
Admission: EM | Admit: 2019-04-02 | Discharge: 2019-04-02 | Disposition: A | Discharge status: Home / Self Care | Payer: 59 | Attending: Emergency Medicine | Admitting: Emergency Medicine

## 2019-04-02 DIAGNOSIS — M542 Cervicalgia: Secondary | ICD-10-CM | POA: Insufficient documentation

## 2019-04-02 DIAGNOSIS — R519 Headache, unspecified: Secondary | ICD-10-CM | POA: Insufficient documentation

## 2019-04-02 DIAGNOSIS — F17210 Nicotine dependence, cigarettes, uncomplicated: Secondary | ICD-10-CM | POA: Insufficient documentation

## 2019-04-02 DIAGNOSIS — Z79899 Other long term (current) drug therapy: Secondary | ICD-10-CM | POA: Insufficient documentation

## 2019-04-02 DIAGNOSIS — J45909 Unspecified asthma, uncomplicated: Secondary | ICD-10-CM | POA: Diagnosis not present

## 2019-04-02 DIAGNOSIS — R600 Localized edema: Secondary | ICD-10-CM | POA: Insufficient documentation

## 2019-04-02 LAB — CBC WITH DIFFERENTIAL/PLATELET
Abs Immature Granulocytes: 0.03 10*3/uL (ref 0.00–0.07)
Basophils Absolute: 0 10*3/uL (ref 0.0–0.1)
Basophils Relative: 0 %
Eosinophils Absolute: 0 10*3/uL (ref 0.0–0.5)
Eosinophils Relative: 0 %
HCT: 25.5 % — ABNORMAL LOW (ref 36.0–46.0)
Hemoglobin: 8 g/dL — ABNORMAL LOW (ref 12.0–15.0)
Immature Granulocytes: 0 %
Lymphocytes Relative: 13 %
Lymphs Abs: 1.2 10*3/uL (ref 0.7–4.0)
MCH: 23.1 pg — ABNORMAL LOW (ref 26.0–34.0)
MCHC: 31.4 g/dL (ref 30.0–36.0)
MCV: 73.7 fL — ABNORMAL LOW (ref 80.0–100.0)
Monocytes Absolute: 0.8 10*3/uL (ref 0.1–1.0)
Monocytes Relative: 8 %
Neutro Abs: 7.4 10*3/uL (ref 1.7–7.7)
Neutrophils Relative %: 79 %
Platelets: 406 10*3/uL — ABNORMAL HIGH (ref 150–400)
RBC: 3.46 MIL/uL — ABNORMAL LOW (ref 3.87–5.11)
RDW: 21.2 % — ABNORMAL HIGH (ref 11.5–15.5)
WBC: 9.4 10*3/uL (ref 4.0–10.5)
nRBC: 0 % (ref 0.0–0.2)

## 2019-04-02 LAB — SEDIMENTATION RATE: Sed Rate: 10 mm/hr (ref 0–22)

## 2019-04-02 LAB — C-REACTIVE PROTEIN: CRP: <0.8 mg/dL (ref <1.0)

## 2019-04-02 LAB — BASIC METABOLIC PANEL
Anion gap: 9 (ref 5–15)
BUN: 7 mg/dL (ref 6–20)
CO2: 22 mmol/L (ref 22–32)
Calcium: 8.8 mg/dL — ABNORMAL LOW (ref 8.9–10.3)
Chloride: 108 mmol/L (ref 98–111)
Creatinine, Ser: 0.58 mg/dL (ref 0.44–1.00)
GFR calc Af Amer: >60 mL/min (ref >60)
GFR calc non Af Amer: >60 mL/min (ref >60)
Glucose, Bld: 96 mg/dL (ref 70–99)
Potassium: 3 mmol/L — ABNORMAL LOW (ref 3.5–5.1)
Sodium: 139 mmol/L (ref 135–145)

## 2019-04-02 MED ORDER — KETOROLAC TROMETHAMINE 30 MG/ML IJ SOLN
30.0000 mg | Freq: Once | INTRAMUSCULAR | Status: AC
  Administered 2019-04-02: 30 mg via INTRAVENOUS
  Filled 2019-04-02: qty 1

## 2019-04-02 MED ORDER — DEXAMETHASONE SODIUM PHOSPHATE 10 MG/ML IJ SOLN
10.0000 mg | Freq: Once | INTRAMUSCULAR | Status: AC
  Administered 2019-04-02: 10 mg via INTRAVENOUS

## 2019-04-02 MED ORDER — POTASSIUM CHLORIDE CRYS ER 20 MEQ PO TBCR
40.0000 meq | EXTENDED_RELEASE_TABLET | Freq: Once | ORAL | Status: AC
  Administered 2019-04-02: 40 meq via ORAL
  Filled 2019-04-02: qty 2

## 2019-04-02 MED ORDER — IOHEXOL 300 MG/ML  SOLN
75.0000 mL | Freq: Once | INTRAMUSCULAR | Status: AC | PRN
  Administered 2019-04-02: 75 mL via INTRAVENOUS

## 2019-04-02 MED ORDER — SODIUM CHLORIDE 0.9 % IV BOLUS (SEPSIS)
1000.0000 mL | Freq: Once | INTRAVENOUS | Status: AC
  Administered 2019-04-02: 1000 mL via INTRAVENOUS

## 2019-04-02 NOTE — ED Triage Notes (Addendum)
Pt c/o swelling to left neck. Was seen at [facility] yesterday for same. Pt denies receiving medications/abx for treatment.

## 2019-04-02 NOTE — ED Notes (Signed)
To ct

## 2019-04-02 NOTE — ED Notes (Signed)
Pt has kept head covered, and laid on side during entire triage.

## 2019-04-02 NOTE — ED Provider Notes (Signed)
TIME SEEN: 4:21 AM  CHIEF COMPLAINT: Left-sided neck and facial swelling  HPI: Patient is a 48 year old female with history of migraine headaches, asthma, anemia who presents to the emergency department with left-sided facial and neck swelling and pain.  This is her seventh visit in the past several days for the same.  She complains of associated headache with this pain and feels that this is different from her migraines and states it is because of the neck discomfort.  She denies dental pain, sore throat, ear pain, fevers, cough.  No injury to her head or neck.  She takes Imitrex for migraines but has not been taking her Imitrex because this does not feel like a migraine.  No numbness or focal weakness.  It appears during her last visit they recommended Keflex for possible lymphadenopathy however this medication does not appear that it was prescribed.  No history of temporal arteritis.  No vision changes.  ROS: See HPI Constitutional: no fever  Eyes: no drainage  ENT: no runny nose   Cardiovascular:  no chest pain  Resp: no SOB  GI: no vomiting GU: no dysuria Integumentary: no rash  Allergy: no hives  Musculoskeletal: no leg swelling  Neurological: no slurred speech ROS otherwise negative  PAST MEDICAL HISTORY/PAST SURGICAL HISTORY:  Past Medical History:  Diagnosis Date  . Anemia   . Asthma   . Blood transfusion without reported diagnosis   . Chronic pain   . GERD (gastroesophageal reflux disease)   . Migraine   . Sickle cell trait ([place])   . Ulcer     MEDICATIONS:  Prior to Admission medications   Medication Sig Start Date End Date Taking? Authorizing Provider  albuterol (PROVENTIL HFA;VENTOLIN HFA) 108 (90 Base) MCG/ACT inhaler Inhale 1-2 puffs into the lungs every 6 (six) hours as needed for wheezing or shortness of breath. Patient not taking: Reported on 03/24/2019 04/08/18   Long, Wonda Olds, MD  benzonatate (TESSALON) 100 MG capsule Take 1 capsule (100 mg total) by mouth  every 8 (eight) hours. Patient not taking: Reported on 03/24/2019 09/16/18   Frederica Kuster, PA-C  cephALEXin (KEFLEX) 500 MG capsule Take 1 capsule (500 mg total) by mouth 4 (four) times daily. 03/31/19   Veryl Speak, MD  fluticasone (FLONASE) 50 MCG/ACT nasal spray Place 2 sprays into both nostrils daily as needed for allergies or rhinitis. Patient not taking: Reported on 04/08/2018 01/18/18   Virgel Manifold, MD  methocarbamol (ROBAXIN) 500 MG tablet Take 1 tablet (500 mg total) by mouth 2 (two) times daily. Patient not taking: Reported on 04/08/2018 01/31/18   Hedges, Dellis Filbert, PA-C  naproxen (NAPROSYN) 500 MG tablet Take 1 tablet (500 mg total) by mouth 2 (two) times daily. Patient not taking: Reported on 04/08/2018 01/31/18   Hedges, Dellis Filbert, PA-C  potassium chloride SA (K-DUR,KLOR-CON) 20 MEQ tablet Take 2 tablets (40 mEq total) by mouth 2 (two) times daily. Patient not taking: Reported on 01/18/2018 12/10/17   Mesner, Corene Cornea, MD  sodium chloride (OCEAN) 0.65 % SOLN nasal spray Place 1 spray into both nostrils as needed for congestion. Patient not taking: Reported on 03/24/2019 12/14/18   Darlin Drop P, PA-C  SUMAtriptan (IMITREX) 6 MG/0.5ML SOLN injection Inject 0.5 mLs (6 mg total) into the skin every 2 (two) hours as needed for migraine or headache. May repeat in 2 hours if headache persists or recurs.  Please provide 10-0.5 mg doses. 03/31/19   Veryl Speak, MD    ALLERGIES:  Allergies  Allergen Reactions  .  Hydrocodone-Acetaminophen Nausea And Vomiting    Patient can tolerate acetaminophen  . Penicillins Itching    Has patient had a PCN reaction causing immediate rash, facial/tongue/throat swelling, SOB or lightheadedness with hypotension: Y Has patient had a PCN reaction causing severe rash involving mucus membranes or skin necrosis: Y Has patient had a PCN reaction that required hospitalization: N Has patient had a PCN reaction occurring within the last 10 years: Y If all of the  above answers are "NO", then may proceed with Cephalosporin use.     SOCIAL HISTORY:  Social History   Tobacco Use  . Smoking status: Current Some Day Smoker    Packs/day: 0.25    Years: 15.00    Pack years: 3.75    Types: Cigarettes  . Smokeless tobacco: Never Used  Substance Use Topics  . Alcohol use: No    FAMILY HISTORY: Family History  Problem Relation Age of Onset  . Heart disease Mother        Arrythmia  . Kidney disease Mother   . Hypertension Mother   . COPD Mother   . Diabetes Mother   . Lung cancer Mother   . Heart disease Father   . Crohn's disease Maternal Aunt   . Breast cancer Maternal Aunt        40's    EXAM: BP (!) 134/106   Pulse 74   Temp 98 F (36.7 C) (Oral)   Resp 17   Ht 5' 4"  (1.626 m)   Wt 63 kg   LMP 03/13/2019   SpO2 100%   BMI 23.84 kg/m  CONSTITUTIONAL: Alert and oriented and responds appropriately to questions.  Patient appears uncomfortable and is tearful HEAD: Normocephalic EYES: Conjunctivae clear, pupils appear equal, EOMI ENT: normal nose; moist mucous membranes; No pharyngeal erythema or petechiae, no tonsillar hypertrophy or exudate, no uvular deviation, no unilateral swelling, no trismus or drooling, no muffled voice, normal phonation, no stridor, multiple dental caries present, no drainable dental abscess noted, no Ludwig's angina, tongue sits flat in the bottom of the mouth, no angioedema.  She is tender to palpation over the left face along the jaw, parotid gland, submandibular area and lateral neck.  She does appear to have some mild swelling throughout her face and her eyelids bilaterally without redness or warmth.  No significant swelling noted to the neck.  Trachea does appear to be midline.  She has minimal tenderness over the left temple without redness, swelling or warmth. NECK: Supple, no meningismus, no nuchal rigidity, no LAD, no carotid bruit, trachea midline, no midline spinal tenderness or step-off or deformity,  no significant swelling noted on exam, no redness or warmth CARD: RRR; S1 and S2 appreciated; no murmurs, no clicks, no rubs, no gallops RESP: Normal chest excursion without splinting or tachypnea; breath sounds clear and equal bilaterally; no wheezes, no rhonchi, no rales, no hypoxia or respiratory distress, speaking full sentences ABD/GI: Normal bowel sounds; non-distended; soft, non-tender, no rebound, no guarding, no peritoneal signs, no hepatosplenomegaly BACK:  The back appears normal and is non-tender to palpation, there is no CVA tenderness EXT: Normal ROM in all joints; non-tender to palpation; no edema; normal capillary refill; no cyanosis, no calf tenderness or swelling 2+ radial pulses bilaterally    SKIN: Normal color for age and race; warm; no rash NEURO: Moves all extremities equally, sensation to light touch intact diffusely, cranial nerves II through XII intact, normal speech PSYCH: The patient's mood and manner are appropriate. Grooming and personal  hygiene are appropriate.  MEDICAL DECISION MAKING: Patient here with increasing left-sided facial and neck pain.  She has had a CT of her head that showed no acute abnormality.  Will obtain CT of the neck with contrast.  Discussed with radiology technician and this will include most of her face as well.  She is afebrile here but does appear very uncomfortable.  No focal neurologic deficits.  Doubt meningitis, encephalitis, intracranial hemorrhage, stroke.  Low suspicion for temporal arteritis but she has some mild tenderness over the temple.  Will obtain ESR and CRP.  Will give Toradol, IV fluids for symptomatic relief.  ED PROGRESS: 7:00 AM  Patient's labs unremarkable other than chronic anemia which is stable.  Potassium is 3.0.  Will give oral replacement.  ESR and CRP are normal.  CT pending.  CT shows no acute abnormality.  Patient has closure of the glottis on CT imaging but cannot rule out glottic edema but this does not fit her  clinical history.  No difficulty speaking, swallowing or breathing.  No throat pain.  This is mostly left-sided neck and facial pain.  There is no mass, adenopathy, swelling, inflammation, abscess noted.  Discussed these findings with patient.  I recommended follow-up with her primary care physician if symptoms continue.  Headache improved with Toradol but not completely resolved.  Will give dose of Decadron prior to discharge.  Patient comfortable with plan.   At this time, I do not feel there is any life-threatening condition present. I have reviewed and discussed all results (EKG, imaging, lab, urine as appropriate) and exam findings with patient/family. I have reviewed nursing notes and appropriate previous records.  I feel the patient is safe to be discharged home without further emergent workup and can continue workup as an outpatient as needed. Discussed usual and customary return precautions. Patient/family verbalize understanding and are comfortable with this plan.  Outpatient follow-up has been provided as needed. All questions have been answered.   Jenny Evans was evaluated in Emergency Department on 04/02/2019 for the symptoms described in the history of present illness. She was evaluated in the context of the global COVID-19 pandemic, which necessitated consideration that the patient might be at risk for infection with the SARS-CoV-2 virus that causes COVID-19. Institutional protocols and algorithms that pertain to the evaluation of patients at risk for COVID-19 are in a state of rapid change based on information released by regulatory bodies including the CDC and federal and state organizations. These policies and algorithms were followed during the patient's care in the ED.    Ward, Delice Bison, DO 04/02/19 [phone]

## 2019-04-02 NOTE — Discharge Instructions (Addendum)
You were seen in the emergency department for left-sided facial and neck pain.  Your labs today were reassuring including normal inflammatory markers and a normal white blood cell count.  Your CT scan of your head recently was normal and CT of your neck and face showed no acute abnormality.  Please follow-up with your primary care provider if symptoms continue.  There was no sign of infection, mass, swelling noted to your face or neck on your CT imaging.

## 2019-04-06 ENCOUNTER — Telehealth (INDEPENDENT_AMBULATORY_CARE_PROVIDER_SITE_OTHER): Payer: 59 | Admitting: Primary Care

## 2019-04-06 ENCOUNTER — Encounter (INDEPENDENT_AMBULATORY_CARE_PROVIDER_SITE_OTHER): Payer: Self-pay

## 2019-05-23 ENCOUNTER — Emergency Department (HOSPITAL_COMMUNITY)
Admission: EM | Admit: 2019-05-23 | Discharge: 2019-05-23 | Disposition: A | Discharge status: Home / Self Care | Payer: 59 | Attending: Emergency Medicine | Admitting: Emergency Medicine

## 2019-05-23 ENCOUNTER — Other Ambulatory Visit: Payer: Self-pay

## 2019-05-23 ENCOUNTER — Encounter (HOSPITAL_COMMUNITY): Payer: Self-pay | Admitting: *Deleted

## 2019-05-23 DIAGNOSIS — D649 Anemia, unspecified: Secondary | ICD-10-CM | POA: Diagnosis not present

## 2019-05-23 DIAGNOSIS — J45909 Unspecified asthma, uncomplicated: Secondary | ICD-10-CM | POA: Diagnosis not present

## 2019-05-23 DIAGNOSIS — H73011 Bullous myringitis, right ear: Secondary | ICD-10-CM | POA: Diagnosis not present

## 2019-05-23 DIAGNOSIS — H60501 Unspecified acute noninfective otitis externa, right ear: Secondary | ICD-10-CM

## 2019-05-23 DIAGNOSIS — F17210 Nicotine dependence, cigarettes, uncomplicated: Secondary | ICD-10-CM | POA: Insufficient documentation

## 2019-05-23 DIAGNOSIS — H9201 Otalgia, right ear: Secondary | ICD-10-CM | POA: Diagnosis present

## 2019-05-23 DIAGNOSIS — Z79899 Other long term (current) drug therapy: Secondary | ICD-10-CM | POA: Diagnosis not present

## 2019-05-23 LAB — I-STAT CHEM 8, ED
BUN: 4 mg/dL — ABNORMAL LOW (ref 6–20)
Calcium, Ion: 1.19 mmol/L (ref 1.15–1.40)
Chloride: 106 mmol/L (ref 98–111)
Creatinine, Ser: 0.5 mg/dL (ref 0.44–1.00)
Glucose, Bld: 113 mg/dL — ABNORMAL HIGH (ref 70–99)
HCT: 28 % — ABNORMAL LOW (ref 36.0–46.0)
Hemoglobin: 9.5 g/dL — ABNORMAL LOW (ref 12.0–15.0)
Potassium: 3.2 mmol/L — ABNORMAL LOW (ref 3.5–5.1)
Sodium: 138 mmol/L (ref 135–145)
TCO2: 23 mmol/L (ref 22–32)

## 2019-05-23 MED ORDER — ACETAMINOPHEN 500 MG PO TABS: 500.0000 mg | ORAL_TABLET | Freq: Four times a day (QID) | ORAL | 0 refills | Status: DC | PRN

## 2019-05-23 MED ORDER — CEFDINIR 300 MG PO CAPS: 300.0000 mg | ORAL_CAPSULE | Freq: Two times a day (BID) | ORAL | 0 refills | Status: DC

## 2019-05-23 MED ORDER — POTASSIUM CHLORIDE CRYS ER 20 MEQ PO TBCR
60.0000 meq | EXTENDED_RELEASE_TABLET | Freq: Once | ORAL | Status: AC
  Administered 2019-05-23: 60 meq via ORAL
  Filled 2019-05-23: qty 3

## 2019-05-23 MED ORDER — NEOMYCIN-POLYMYXIN-HC 3.5-10000-1 OT SUSP: 4.0000 [drp] | Freq: Four times a day (QID) | OTIC | 0 refills | Status: AC

## 2019-05-23 MED ORDER — IBUPROFEN 600 MG PO TABS: 600.0000 mg | ORAL_TABLET | Freq: Four times a day (QID) | ORAL | 0 refills | Status: DC | PRN

## 2019-05-23 MED ORDER — IBUPROFEN 400 MG PO TABS
600.0000 mg | ORAL_TABLET | Freq: Once | ORAL | Status: AC
  Administered 2019-05-23: 600 mg via ORAL
  Filled 2019-05-23: qty 1

## 2019-05-23 MED ORDER — NEOMYCIN-POLYMYXIN-HC 1 % OT SOLN
4.0000 [drp] | Freq: Once | OTIC | Status: AC
  Administered 2019-05-23: 4 [drp] via OTIC
  Filled 2019-05-23: qty 10

## 2019-05-23 MED ORDER — NEOMYCIN-COLIST-HC-THONZONIUM 3.3-3-10-0.5 MG/ML OT SUSP
4.0000 [drp] | Freq: Once | OTIC | Status: DC
  Filled 2019-05-23: qty 10

## 2019-05-23 MED ORDER — CEFDINIR 300 MG PO CAPS
300.0000 mg | ORAL_CAPSULE | Freq: Once | ORAL | Status: AC
  Administered 2019-05-23: 300 mg via ORAL
  Filled 2019-05-23: qty 1

## 2019-05-23 NOTE — ED Notes (Signed)
PA at bedside.

## 2019-05-23 NOTE — ED Provider Notes (Signed)
[facility] EMERGENCY DEPARTMENT Provider Note   CSN: [id number] Arrival date & time: 05/23/19  4098     History   Chief Complaint Chief Complaint  Patient presents with  . Otalgia    HPI Jenny Evans is a 48 y.o. female with history of anemia, sickle cell trait who presents with a 3 to 4-day history of right ear pain.  She has had some allergy symptoms, but otherwise denies any fevers, cough, chest pain, shortness of breath, sore throat.  She has not had problems with the ear before.  She has not tried any medications at home for symptoms.  Patient also reports generalized fatigue and feeling like her hemoglobin is low.  She reports history of anemia and transfusions in the past.  She denies any abnormal bleeding.  She reports her doctor has not been able to determine cause of anemia.     HPI  Past Medical History:  Diagnosis Date  . Anemia   . Asthma   . Blood transfusion without reported diagnosis   . Chronic pain   . GERD (gastroesophageal reflux disease)   . Migraine   . Sickle cell trait (HCC)   . Ulcer     Patient Active Problem List   Diagnosis Date Noted  . Sickle cell trait (HCC) 06/14/2017  . Symptomatic anemia 06/14/2017  . Blood loss anemia 11/12/2015  . Melena 11/12/2015  . Nonspecific abnormal electrocardiogram (ECG) (EKG) 04/03/2015  . Palpitations 04/03/2015  . Neck pain 04/03/2015  . Colitis 07/10/2014  . Absolute anemia   . Post-operative state 08/26/2013  . Rectocele 08/26/2013  . Anal or rectal pain 07/29/2013  . Headache(784.0) 02/27/2012  . ACUTE BRONCHITIS 03/25/2010  . OBESITY 09/06/2009  . INSOMNIA 01/23/2009  . HYPOKALEMIA 11/01/2008  . DYSPEPSIA&OTHER [facility] DISORDERS FUNCTION STOMACH 06/07/2008  . ALLERGIC RHINITIS 03/23/2008  . CONSTIPATION 01/20/2008  . Shortness of breath 08/27/2007  . TOBACCO ABUSE 03/09/2007  . Migraine 03/09/2007  . ABSCESS, TOOTH 03/09/2007  . GERD (gastroesophageal reflux disease)  03/09/2007  . Migraine variant 02/25/2007  . ECZEMA 02/25/2007    Past Surgical History:  Procedure Laterality Date  . ESOPHAGOGASTRODUODENOSCOPY (EGD) WITH PROPOFOL N/A 04/04/2013   Procedure: ESOPHAGOGASTRODUODENOSCOPY (EGD) WITH PROPOFOL;  Surgeon: Willis Modena, MD;  Location: [facility];  Service: Endoscopy;  Laterality: N/A;  . EXAMINATION UNDER ANESTHESIA N/A 08/10/2013   Procedure: RECTAL EXAM UNDER ANESTHESIA;  Surgeon: Clovis Pu. Cornett, MD;  Location: [facility];  Service: General;  Laterality: N/A;  ligation  . HEMORRHOID SURGERY    . MYRINGOTOMY     age 29     OB History   No obstetric history on file.      Home Medications    Prior to Admission medications   Medication Sig Start Date End Date Taking? Authorizing Provider  acetaminophen (TYLENOL) 500 MG tablet Take 1 tablet (500 mg total) by mouth every 6 (six) hours as needed. 05/23/19   Junnie Loschiavo, Waylan Boga, PA-C  albuterol (PROVENTIL HFA;VENTOLIN HFA) 108 (90 Base) MCG/ACT inhaler Inhale 1-2 puffs into the lungs every 6 (six) hours as needed for wheezing or shortness of breath. Patient not taking: Reported on 03/24/2019 04/08/18   Long, [name], MD  benzonatate (TESSALON) 100 MG capsule Take 1 capsule (100 mg total) by mouth every 8 (eight) hours. Patient not taking: Reported on 03/24/2019 09/16/18   Emi Holes, PA-C  cefdinir (OMNICEF) 300 MG capsule Take 1 capsule (300 mg total) by mouth 2 (  two) times daily. 05/23/19   Jimmylee Ratterree, Bea Graff, PA-C  cephALEXin (KEFLEX) 500 MG capsule Take 1 capsule (500 mg total) by mouth 4 (four) times daily. 03/31/19   Veryl Speak, MD  fluticasone (FLONASE) 50 MCG/ACT nasal spray Place 2 sprays into both nostrils daily as needed for allergies or rhinitis. Patient not taking: Reported on 04/08/2018 01/18/18   Virgel Manifold, MD  ibuprofen (ADVIL) 600 MG tablet Take 1 tablet (600 mg total) by mouth every 6 (six) hours as needed. 05/23/19   Audrey Thull, Bea Graff, PA-C   methocarbamol (ROBAXIN) 500 MG tablet Take 1 tablet (500 mg total) by mouth 2 (two) times daily. Patient not taking: Reported on 04/08/2018 01/31/18   Hedges, Dellis Filbert, PA-C  naproxen (NAPROSYN) 500 MG tablet Take 1 tablet (500 mg total) by mouth 2 (two) times daily. Patient not taking: Reported on 04/08/2018 01/31/18   Hedges, Dellis Filbert, PA-C  neomycin-polymyxin-hydrocortisone (CORTISPORIN) 3.5-10000-1 OTIC suspension Place 4 drops into the right ear 4 (four) times daily for 7 days. 05/23/19 05/30/19  Frederica Kuster, PA-C  potassium chloride SA (K-DUR,KLOR-CON) 20 MEQ tablet Take 2 tablets (40 mEq total) by mouth 2 (two) times daily. Patient not taking: Reported on 01/18/2018 12/10/17   Mesner, Corene Cornea, MD  sodium chloride (OCEAN) 0.65 % SOLN nasal spray Place 1 spray into both nostrils as needed for congestion. Patient not taking: Reported on 03/24/2019 12/14/18   Darlin Drop P, PA-C  SUMAtriptan (IMITREX) 6 MG/0.5ML SOLN injection Inject 0.5 mLs (6 mg total) into the skin every 2 (two) hours as needed for migraine or headache. May repeat in 2 hours if headache persists or recurs.  Please provide 10-0.5 mg doses. 03/31/19   Veryl Speak, MD    Family History Family History  Problem Relation Age of Onset  . Heart disease Mother        Arrythmia  . Kidney disease Mother   . Hypertension Mother   . COPD Mother   . Diabetes Mother   . Lung cancer Mother   . Heart disease Father   . Crohn's disease Maternal Aunt   . Breast cancer Maternal Aunt        40's    Social History Social History   Tobacco Use  . Smoking status: Current Some Day Smoker    Packs/day: 0.25    Years: 15.00    Pack years: 3.75    Types: Cigarettes  . Smokeless tobacco: Never Used  Substance Use Topics  . Alcohol use: No  . Drug use: No     Allergies   Hydrocodone-acetaminophen and Penicillins   Review of Systems Review of Systems  Constitutional: Positive for fatigue. Negative for fever.  HENT:  Positive for ear pain.      Physical Exam Updated Vital Signs BP (!) 127/56 (BP Location: Right Arm)   Pulse 79   Temp 98.3 F (36.8 C) (Oral)   Resp 16   Ht 5\' 4"  (1.626 m)   Wt 63 kg   LMP 04/30/2019   SpO2 100%   BMI 23.86 kg/m   Physical Exam Vitals signs and nursing note reviewed.  Constitutional:      General: She is not in acute distress.    Appearance: She is well-developed. She is not diaphoretic.  HENT:     Head: Normocephalic and atraumatic.     Right Ear: No mastoid tenderness. Tympanic membrane is not injected, perforated or erythematous.     Left Ear: Tympanic membrane normal. No mastoid tenderness.  Ears:      Comments: Canal tenderness on the right with drainage and inflammation    Mouth/Throat:     Pharynx: No oropharyngeal exudate.  Eyes:     General: No scleral icterus.       Right eye: No discharge.        Left eye: No discharge.     Conjunctiva/sclera: Conjunctivae normal.     Pupils: Pupils are equal, round, and reactive to light.     Comments: Pale conjunctiva  Neck:     Musculoskeletal: Full passive range of motion without pain, normal range of motion and neck supple.     Thyroid: No thyromegaly.  Cardiovascular:     Rate and Rhythm: Normal rate and regular rhythm.     Heart sounds: Normal heart sounds. No murmur. No friction rub. No gallop.   Pulmonary:     Effort: Pulmonary effort is normal. No respiratory distress.     Breath sounds: Normal breath sounds. No stridor. No wheezing or rales.  Abdominal:     General: Bowel sounds are normal. There is no distension.     Palpations: Abdomen is soft.     Tenderness: There is no abdominal tenderness. There is no guarding or rebound.  Lymphadenopathy:     Cervical: No cervical adenopathy.  Skin:    General: Skin is warm and dry.     Coloration: Skin is not pale.     Findings: No rash.  Neurological:     Mental Status: She is alert.     Coordination: Coordination normal.      ED  Treatments / Results  Labs (all labs ordered are listed, but only abnormal results are displayed) Labs Reviewed  I-STAT CHEM 8, ED - Abnormal; Notable for the following components:      Result Value   Potassium 3.2 (*)    BUN 4 (*)    Glucose, Bld 113 (*)    Hemoglobin 9.5 (*)    HCT 28.0 (*)    All other components within normal limits    EKG None  Radiology No results found.  Procedures Procedures (including critical care time)  Medications Ordered in ED Medications  potassium chloride SA (KLOR-CON) CR tablet 60 mEq (has no administration in time range)  cefdinir (OMNICEF) capsule 300 mg (has no administration in time range)  neomycin-colistin-hydrocortisone-thonzonium (CORTISPORIN TC) OTIC (EAR) suspension 4 drop (has no administration in time range)  ibuprofen (ADVIL) tablet 600 mg (has no administration in time range)     Initial Impression / Assessment and Plan / ED Course  I have reviewed the triage vital signs and the nursing notes.  Pertinent labs & imaging results that were available during my care of the patient were reviewed by me and considered in my medical decision making (see chart for details).        Patient found to have suspected otitis externa and bullous meningitis in the right ear.  No signs of mastoiditis or meningitis.  She is also reporting fatigue and feeling like her hemoglobin is low.  I-STAT Chem-8 finding globin to be 9.5.  This is improved from 03/2019.  Patient is not having any chest pain, shortness of breath, syncope, lightheadedness.  Will refer to PCP for further management of this and outpatient transfusion if indicated.  Patient mildly hypokalemic, replaced in the ED.  Patient denies possibility of pregnancy.  Will discharge home with cefdinir considering patient's penicillin allergy as well as Cortisporin.  Patient requests dose in the  ED.  Advised ibuprofen and Tylenol for pain control.  Follow-up to PCP/ENT if symptoms are not  improving.  Return precautions discussed.  Patient understands and agrees with plan.  Patient vitals stable throughout ED course and discharged in satisfactory condition.  Final Clinical Impressions(s) / ED Diagnoses   Final diagnoses:  Acute otitis externa of right ear, unspecified type  Bullous myringitis of right ear  Low hemoglobin    ED Discharge Orders         Ordered    cefdinir (OMNICEF) 300 MG capsule  2 times daily     05/23/19 0843    neomycin-polymyxin-hydrocortisone (CORTISPORIN) 3.5-10000-1 OTIC suspension  4 times daily     05/23/19 0843    ibuprofen (ADVIL) 600 MG tablet  Every 6 hours PRN     05/23/19 0843    acetaminophen (TYLENOL) 500 MG tablet  Every 6 hours PRN     05/23/19 0843           Emi Holes, PA-C 05/23/19 7824    Rolan Bucco, MD 05/23/19 [phone]

## 2019-05-23 NOTE — ED Notes (Signed)
Spoke with pharmacy and they just tubed the ear drops.

## 2019-05-23 NOTE — ED Triage Notes (Signed)
The pt is c/o a rt earache with swelliong and draining 3-4 days no temp  lmp 2 weeks ago

## 2019-05-23 NOTE — ED Notes (Signed)
Pt verbalized understanding of d/c instructions and has no further questions, VSS, NAD.  

## 2019-05-23 NOTE — Discharge Instructions (Signed)
Take Omnicef twice daily as prescribed until completed.  It is important that you finish all of this medication.  Use Cortisporin drops in your right ear as prescribed 4 times daily for 7 days.  Alternate ibuprofen and Tylenol as prescribed for your pain.  If your symptoms are not improving, please follow-up with your doctor or the ear, nose, throat doctor.  Please touch base with your doctor regarding your fatigue.  Your hemoglobin was 9.5 today.  You can speak with them about arranging an outpatient transfusion if this is continuing.

## 2019-09-22 ENCOUNTER — Other Ambulatory Visit: Payer: Self-pay

## 2019-09-22 ENCOUNTER — Ambulatory Visit (HOSPITAL_COMMUNITY)
Admission: EM | Admit: 2019-09-22 | Discharge: 2019-09-22 | Disposition: A | Discharge status: Home / Self Care | Payer: 59 | Attending: Family Medicine | Admitting: Family Medicine

## 2019-09-22 ENCOUNTER — Encounter (HOSPITAL_COMMUNITY): Payer: Self-pay

## 2019-09-22 DIAGNOSIS — E669 Obesity, unspecified: Secondary | ICD-10-CM | POA: Insufficient documentation

## 2019-09-22 DIAGNOSIS — Z79899 Other long term (current) drug therapy: Secondary | ICD-10-CM | POA: Diagnosis not present

## 2019-09-22 DIAGNOSIS — Z791 Long term (current) use of non-steroidal anti-inflammatories (NSAID): Secondary | ICD-10-CM | POA: Diagnosis not present

## 2019-09-22 DIAGNOSIS — J45909 Unspecified asthma, uncomplicated: Secondary | ICD-10-CM | POA: Insufficient documentation

## 2019-09-22 DIAGNOSIS — G4700 Insomnia, unspecified: Secondary | ICD-10-CM | POA: Diagnosis not present

## 2019-09-22 DIAGNOSIS — G43909 Migraine, unspecified, not intractable, without status migrainosus: Secondary | ICD-10-CM | POA: Insufficient documentation

## 2019-09-22 DIAGNOSIS — Z20822 Contact with and (suspected) exposure to covid-19: Secondary | ICD-10-CM | POA: Diagnosis not present

## 2019-09-22 DIAGNOSIS — J069 Acute upper respiratory infection, unspecified: Secondary | ICD-10-CM | POA: Insufficient documentation

## 2019-09-22 DIAGNOSIS — D573 Sickle-cell trait: Secondary | ICD-10-CM | POA: Diagnosis not present

## 2019-09-22 DIAGNOSIS — F17210 Nicotine dependence, cigarettes, uncomplicated: Secondary | ICD-10-CM | POA: Insufficient documentation

## 2019-09-22 DIAGNOSIS — K219 Gastro-esophageal reflux disease without esophagitis: Secondary | ICD-10-CM | POA: Diagnosis not present

## 2019-09-22 LAB — SARS CORONAVIRUS 2 (TAT 6-24 HRS): SARS Coronavirus 2: NEGATIVE

## 2019-09-22 MED ORDER — NAPROXEN 500 MG PO TABS: 500.0000 mg | ORAL_TABLET | Freq: Two times a day (BID) | ORAL | 0 refills | Status: DC | PRN

## 2019-09-22 MED ORDER — BENZONATATE 200 MG PO CAPS: 200.0000 mg | ORAL_CAPSULE | Freq: Three times a day (TID) | ORAL | 0 refills | Status: AC | PRN

## 2019-09-22 MED ORDER — CETIRIZINE HCL 10 MG PO CAPS: 10.0000 mg | ORAL_CAPSULE | Freq: Every day | ORAL | 0 refills | Status: DC

## 2019-09-22 MED ORDER — FLUTICASONE PROPIONATE 50 MCG/ACT NA SUSP: 1.0000 | Freq: Every day | NASAL | 0 refills | Status: DC

## 2019-09-22 NOTE — ED Provider Notes (Signed)
[facility]    CSN: [id number] Arrival date & time: 09/22/19  1239      History   Chief Complaint Chief Complaint  Patient presents with  . Loss of taste  . Cough    HPI Jenny Evans is a 48 y.o. female history of GERD, tobacco use, presenting today for evaluation of cough and congestion.  Patient notes that over the past 4 days she has had a persistent cough, feeling congested and stuffed up in her sinuses.  She has also noticed loss of taste.  She denies any known measured fevers, but has had subjective fevers at times.  Denies chest pain.  Denies any GI symptoms.  Denies close sick contacts.  Using Benadryl, DayQuil, NyQuil, without relief.  Also reports cramping, currently on menstrual cycle.   HPI  Past Medical History:  Diagnosis Date  . Anemia   . Asthma   . Blood transfusion without reported diagnosis   . Chronic pain   . GERD (gastroesophageal reflux disease)   . Migraine   . Sickle cell trait ([place])   . Ulcer     Patient Active Problem List   Diagnosis Date Noted  . Sickle cell trait ([place]) 06/14/2017  . Symptomatic anemia 06/14/2017  . Blood loss anemia 11/12/2015  . Melena 11/12/2015  . Nonspecific abnormal electrocardiogram (ECG) (EKG) 04/03/2015  . Palpitations 04/03/2015  . Neck pain 04/03/2015  . Colitis 07/10/2014  . Absolute anemia   . Post-operative state 08/26/2013  . Rectocele 08/26/2013  . Anal or rectal pain 07/29/2013  . Headache(784.0) 02/27/2012  . ACUTE BRONCHITIS 03/25/2010  . OBESITY 09/06/2009  . INSOMNIA 01/23/2009  . HYPOKALEMIA 11/01/2008  . DYSPEPSIA&OTHER [facility] DISORDERS FUNCTION STOMACH 06/07/2008  . ALLERGIC RHINITIS 03/23/2008  . CONSTIPATION 01/20/2008  . Shortness of breath 08/27/2007  . TOBACCO ABUSE 03/09/2007  . Migraine 03/09/2007  . ABSCESS, TOOTH 03/09/2007  . GERD (gastroesophageal reflux disease) 03/09/2007  . Migraine variant 02/25/2007  . ECZEMA 02/25/2007    Past Surgical History:    Procedure Laterality Date  . ESOPHAGOGASTRODUODENOSCOPY (EGD) WITH PROPOFOL N/A 04/04/2013   Procedure: ESOPHAGOGASTRODUODENOSCOPY (EGD) WITH PROPOFOL;  Surgeon: Arta Silence, MD;  Location: [facility];  Service: Endoscopy;  Laterality: N/A;  . EXAMINATION UNDER ANESTHESIA N/A 08/10/2013   Procedure: RECTAL EXAM UNDER ANESTHESIA;  Surgeon: Joyice Faster. Cornett, MD;  Location: [place];  Service: General;  Laterality: N/A;  ligation  . HEMORRHOID SURGERY    . MYRINGOTOMY     age 7    OB History   No obstetric history on file.      Home Medications    Prior to Admission medications   Medication Sig Start Date End Date Taking? Authorizing Provider  benzonatate (TESSALON) 200 MG capsule Take 1 capsule (200 mg total) by mouth 3 (three) times daily as needed for up to 7 days for cough. 09/22/19 09/29/19  Maika Mcelveen C, PA-C  Cetirizine HCl 10 MG CAPS Take 1 capsule (10 mg total) by mouth daily. 09/22/19   Lari Linson C, PA-C  fluticasone (FLONASE) 50 MCG/ACT nasal spray Place 1-2 sprays into both nostrils daily for 7 days. 09/22/19 09/29/19  Mickle Campton C, PA-C  naproxen (NAPROSYN) 500 MG tablet Take 1 tablet (500 mg total) by mouth 2 (two) times daily as needed (cramping). 09/22/19   Teal Bontrager C, PA-C  SUMAtriptan (IMITREX) 6 MG/0.5ML SOLN injection Inject 0.5 mLs (6 mg total) into the skin every 2 (two) hours as needed for  migraine or headache. May repeat in 2 hours if headache persists or recurs.  Please provide 10-0.5 mg doses. 03/31/19   Geoffery Lyons, MD  albuterol (PROVENTIL HFA;VENTOLIN HFA) 108 (90 Base) MCG/ACT inhaler Inhale 1-2 puffs into the lungs every 6 (six) hours as needed for wheezing or shortness of breath. Patient not taking: Reported on 03/24/2019 04/08/18 09/22/19  Long, [name], MD  potassium chloride SA (K-DUR,KLOR-CON) 20 MEQ tablet Take 2 tablets (40 mEq total) by mouth 2 (two) times daily. Patient not taking: Reported on 01/18/2018 12/10/17  09/22/19  Mesner, Barbara Cower, MD  sodium chloride (OCEAN) 0.65 % SOLN nasal spray Place 1 spray into both nostrils as needed for congestion. Patient not taking: Reported on 03/24/2019 12/14/18 09/22/19  Leretha Dykes, PA-C    Family History Family History  Problem Relation Age of Onset  . Heart disease Mother        Arrythmia  . Kidney disease Mother   . Hypertension Mother   . COPD Mother   . Diabetes Mother   . Lung cancer Mother   . Heart disease Father   . Crohn's disease Maternal Aunt   . Breast cancer Maternal Aunt        15's    Social History Social History   Tobacco Use  . Smoking status: Current Some Day Smoker    Packs/day: 0.25    Years: 15.00    Pack years: 3.75    Types: Cigarettes  . Smokeless tobacco: Never Used  Substance Use Topics  . Alcohol use: No  . Drug use: Never     Allergies   Hydrocodone-acetaminophen and Penicillins   Review of Systems Review of Systems  Constitutional: Positive for fatigue. Negative for activity change, appetite change, chills and fever.  HENT: Positive for congestion, rhinorrhea and sinus pressure. Negative for ear pain, sore throat and trouble swallowing.   Eyes: Negative for discharge and redness.  Respiratory: Positive for cough. Negative for chest tightness and shortness of breath.   Cardiovascular: Negative for chest pain.  Gastrointestinal: Negative for abdominal pain, diarrhea, nausea and vomiting.  Musculoskeletal: Negative for myalgias.  Skin: Negative for rash.  Neurological: Positive for headaches. Negative for dizziness and light-headedness.     Physical Exam Triage Vital Signs ED Triage Vitals  Enc Vitals Group     BP --      Pulse --      Resp --      Temp --      Temp Source 09/22/19 1249 Oral     SpO2 --      Weight 09/22/19 1255 134 lb 6.4 oz (61 kg)     Height --      Head Circumference --      Peak Flow --      Pain Score 09/22/19 1247 0     Pain Loc --      Pain Edu? --      Excl. in GC?  --    No data found.  Updated Vital Signs BP 116/75 (BP Location: Left Arm)   Pulse 92   Temp 98.5 F (36.9 C) (Oral)   Resp 17   Wt 134 lb 6.4 oz (61 kg)   LMP 09/18/2019   SpO2 99%   BMI 23.07 kg/m   Visual Acuity Right Eye Distance:   Left Eye Distance:   Bilateral Distance:    Right Eye Near:   Left Eye Near:    Bilateral Near:     Physical  Exam Vitals and nursing note reviewed.  Constitutional:      Appearance: She is well-developed.     Comments: No acute distress  HENT:     Head: Normocephalic and atraumatic.     Ears:     Comments: Bilateral ears without tenderness to palpation of external auricle, tragus and mastoid, EAC's without erythema or swelling, TM's with good bony landmarks and cone of light. Non erythematous.     Nose: Nose normal.     Comments: Nasal mucosa erythematous, mildly swollen turbinates    Mouth/Throat:     Comments: Oral mucosa pink and moist, no tonsillar enlargement or exudate. Posterior pharynx patent and nonerythematous, no uvula deviation or swelling. Normal phonation.  Eyes:     Conjunctiva/sclera: Conjunctivae normal.  Cardiovascular:     Rate and Rhythm: Normal rate.  Pulmonary:     Effort: Pulmonary effort is normal. No respiratory distress.     Comments: Breathing comfortably at rest, CTABL, no wheezing, rales or other adventitious sounds auscultated  Frequent dry coughing in room Abdominal:     General: There is no distension.  Musculoskeletal:        General: Normal range of motion.     Cervical back: Neck supple.  Skin:    General: Skin is warm and dry.  Neurological:     Mental Status: She is alert and oriented to person, place, and time.      UC Treatments / Results  Labs (all labs ordered are listed, but only abnormal results are displayed) Labs Reviewed  SARS CORONAVIRUS 2 (TAT 6-24 HRS)    EKG   Radiology No results found.  Procedures Procedures (including critical care time)  Medications  Ordered in UC Medications - No data to display  Initial Impression / Assessment and Plan / UC Course  I have reviewed the triage vital signs and the nursing notes.  Pertinent labs & imaging results that were available during my care of the patient were reviewed by me and considered in my medical decision making (see chart for details).     URI symptoms x4 days, vital signs stable, lungs clear, exam unremarkable.  Most likely viral etiology.  Covid PCR pending.  Recommending continued symptomatic and supportive care with rest and fluids.  Continue to monitor,Discussed strict return precautions. Patient verbalized understanding and is agreeable with plan.  Final Clinical Impressions(s) / UC Diagnoses   Final diagnoses:  Viral URI with cough     Discharge Instructions     Covid swab pending, monitor my chart for results Begin daily cetirizine/Zyrtec and daily Flonase nasal spray to further help with congestion, postnasal drainage and sinus inflammation-May get over-the-counter if cheaper Tessalon/benzonatate every 8 hours as needed for cough Rest and drink plenty fluids  Please follow-up if not seeing any improvement in symptoms in another 4 to 5 days, developing any worsening breathing, shortness of breath, chest pain or fevers    ED Prescriptions    Medication Sig Dispense Auth. Provider   benzonatate (TESSALON) 200 MG capsule Take 1 capsule (200 mg total) by mouth 3 (three) times daily as needed for up to 7 days for cough. 28 capsule Tajay Muzzy C, PA-C   Cetirizine HCl 10 MG CAPS Take 1 capsule (10 mg total) by mouth daily. 15 capsule Kyilee Gregg C, PA-C   fluticasone (FLONASE) 50 MCG/ACT nasal spray Place 1-2 sprays into both nostrils daily for 7 days. 1 g Yonathan Perrow C, PA-C   naproxen (NAPROSYN) 500 MG tablet Take  1 tablet (500 mg total) by mouth 2 (two) times daily as needed (cramping). 30 tablet Zak Gondek, Edisto C, PA-C     PDMP not reviewed this encounter.     Lew Dawes, PA-C 09/22/19 1328

## 2019-09-22 NOTE — Discharge Instructions (Signed)
Covid swab pending, monitor my chart for results Begin daily cetirizine/Zyrtec and daily Flonase nasal spray to further help with congestion, postnasal drainage and sinus inflammation-May get over-the-counter if cheaper Tessalon/benzonatate every 8 hours as needed for cough Rest and drink plenty fluids  Please follow-up if not seeing any improvement in symptoms in another 4 to 5 days, developing any worsening breathing, shortness of breath, chest pain or fevers

## 2019-09-22 NOTE — ED Triage Notes (Addendum)
Pt is here with a loss of taste & cough that started 4 days ago. Pt has taken Benedryl, cough syrup, Theraflu to relieve discomfort.

## 2019-09-30 ENCOUNTER — Ambulatory Visit: Payer: Self-pay | Attending: Internal Medicine

## 2019-09-30 DIAGNOSIS — Z23 Encounter for immunization: Secondary | ICD-10-CM

## 2019-09-30 NOTE — Progress Notes (Signed)
   Covid-19 Vaccination Clinic  Name:  Jenny Evans    MRN: [id number] DOB: Sep 13, 1970  09/30/2019  [name] was observed post Covid-19 immunization for 15 minutes without incident. She was provided with Vaccine Information Sheet and instruction to access the V-Safe system.   [name] was instructed to call 911 with any severe reactions post vaccine: [name] Difficulty breathing  . Swelling of face and throat  . A fast heartbeat  . A bad rash all over body  . Dizziness and weakness   Immunizations Administered    Name Date Dose VIS Date Route   Pfizer COVID-19 Vaccine 09/30/2019  4:57 PM 0.3 mL 05/27/2019 Intramuscular   Manufacturer: [employer], [employer]   Lot: [id number]   NDC: [address]-4

## 2019-10-29 ENCOUNTER — Ambulatory Visit: Payer: Self-pay | Attending: Internal Medicine

## 2019-10-29 DIAGNOSIS — Z23 Encounter for immunization: Secondary | ICD-10-CM

## 2019-10-29 NOTE — Progress Notes (Signed)
   Covid-19 Vaccination Clinic  Name:  Jenny Evans    MRN: [id number] DOB: Aug 25, 1970  10/29/2019  [name] was observed post Covid-19 immunization for 15 minutes without incident. She was provided with Vaccine Information Sheet and instruction to access the V-Safe system.   [name] was instructed to call 911 with any severe reactions post vaccine: [name] Difficulty breathing  . Swelling of face and throat  . A fast heartbeat  . A bad rash all over body  . Dizziness and weakness   Immunizations Administered    Name Date Dose VIS Date Route   Pfizer COVID-19 Vaccine 10/29/2019 11:00 AM 0.3 mL 08/10/2018 Intramuscular   Manufacturer: [employer], [employer]   Lot: [id number]   NDC: [address]-0

## 2019-11-13 ENCOUNTER — Other Ambulatory Visit: Payer: Self-pay

## 2019-11-13 ENCOUNTER — Emergency Department (HOSPITAL_COMMUNITY)
Admission: EM | Admit: 2019-11-13 | Discharge: 2019-11-13 | Disposition: A | Discharge status: Home / Self Care | Payer: 59 | Attending: Emergency Medicine | Admitting: Emergency Medicine

## 2019-11-13 DIAGNOSIS — R600 Localized edema: Secondary | ICD-10-CM | POA: Diagnosis present

## 2019-11-13 DIAGNOSIS — H04012 Acute dacryoadenitis, left lacrimal gland: Secondary | ICD-10-CM | POA: Diagnosis not present

## 2019-11-13 DIAGNOSIS — F17210 Nicotine dependence, cigarettes, uncomplicated: Secondary | ICD-10-CM | POA: Insufficient documentation

## 2019-11-13 DIAGNOSIS — H00014 Hordeolum externum left upper eyelid: Secondary | ICD-10-CM | POA: Insufficient documentation

## 2019-11-13 MED ORDER — FLUORESCEIN SODIUM 1 MG OP STRP
1.0000 | ORAL_STRIP | Freq: Once | OPHTHALMIC | Status: AC
  Administered 2019-11-13: 1 via OPHTHALMIC
  Filled 2019-11-13: qty 1

## 2019-11-13 MED ORDER — CEFDINIR 300 MG PO CAPS: 300.0000 mg | ORAL_CAPSULE | Freq: Two times a day (BID) | ORAL | 0 refills | Status: AC

## 2019-11-13 MED ORDER — SULFAMETHOXAZOLE-TRIMETHOPRIM 800-160 MG PO TABS: 1.0000 | ORAL_TABLET | Freq: Two times a day (BID) | ORAL | 0 refills | Status: AC

## 2019-11-13 MED ORDER — TETRACAINE HCL 0.5 % OP SOLN
2.0000 [drp] | Freq: Once | OPHTHALMIC | Status: AC
  Administered 2019-11-13: 2 [drp] via OPHTHALMIC
  Filled 2019-11-13: qty 4

## 2019-11-13 NOTE — ED Notes (Signed)
An After Visit Summary was printed and given to the patient. Discharge instructions given and no further questions at this time.  

## 2019-11-13 NOTE — Discharge Instructions (Signed)
Please take all of your antibiotics until finished!   Take your antibiotics with food.  Common side effects of antibiotics include nausea, vomiting, abdominal discomfort, and diarrhea. You may help offset some of this with probiotics which you can buy or get in yogurt. Do not eat  or take the probiotics until 2 hours after your antibiotic.    Some studies suggest that certain antibiotics can reduce the efficacy of certain oral contraceptive pills (birth control), so please use additional contraceptives (condoms or other barrier method) while you are taking the antibiotics and for an additional 5 to 7 days afterwards if you are a female on these medications.  You can take 1 to 2 tablets of Tylenol (350mg -1000mg  depending on the dose) every 6 hours as needed for pain.  Do not exceed 4000 mg of Tylenol daily.  If your pain persists you can take a doses of ibuprofen in between doses of Tylenol.  I usually recommend 400 to 600 mg of ibuprofen every 6 hours.  Take this with food to avoid upset stomach issues.  Apply warm compresses 3 or 4 times daily.  Follow-up with ophthalmology within 48 hours for reevaluation of your symptoms.  Return to the emergency department if any concerning signs or symptoms develop such as vision loss, inability to move the eye around, severe pain, fevers.

## 2019-11-13 NOTE — ED Provider Notes (Signed)
[facility] DEPT Provider Note   CSN: [id number] Arrival date & time: 11/13/19  1156     History Chief Complaint  Patient presents with  . Left Eye Pain    Jenny Evans is a 49 y.o. female with history of asthma, GERD, migraine headaches, sickle cell traits, chronic pain presents for evaluation of acute onset, progressively worsening left eyelid swelling for 2 days.  She reports foreign body sensation to the eye but denies eye pain, abnormal drainage, or eye trauma.  She does not wear contact lenses.  She states that when her symptoms began she initially thought that she had a stye and was applying warm compresses but has had no relief in symptoms.  She denies fevers, pain with eye movements, limited eye movement, photophobia, or headaches.  The history is provided by the patient.       Past Medical History:  Diagnosis Date  . Anemia   . Asthma   . Blood transfusion without reported diagnosis   . Chronic pain   . GERD (gastroesophageal reflux disease)   . Migraine   . Sickle cell trait ([place])   . Ulcer     Patient Active Problem List   Diagnosis Date Noted  . Sickle cell trait ([place]) 06/14/2017  . Symptomatic anemia 06/14/2017  . Blood loss anemia 11/12/2015  . Melena 11/12/2015  . Nonspecific abnormal electrocardiogram (ECG) (EKG) 04/03/2015  . Palpitations 04/03/2015  . Neck pain 04/03/2015  . Colitis 07/10/2014  . Absolute anemia   . Post-operative state 08/26/2013  . Rectocele 08/26/2013  . Anal or rectal pain 07/29/2013  . Headache(784.0) 02/27/2012  . ACUTE BRONCHITIS 03/25/2010  . OBESITY 09/06/2009  . INSOMNIA 01/23/2009  . HYPOKALEMIA 11/01/2008  . DYSPEPSIA&OTHER [facility] DISORDERS FUNCTION STOMACH 06/07/2008  . ALLERGIC RHINITIS 03/23/2008  . CONSTIPATION 01/20/2008  . Shortness of breath 08/27/2007  . TOBACCO ABUSE 03/09/2007  . Migraine 03/09/2007  . ABSCESS, TOOTH 03/09/2007  . GERD (gastroesophageal reflux disease)  03/09/2007  . Migraine variant 02/25/2007  . ECZEMA 02/25/2007    Past Surgical History:  Procedure Laterality Date  . ESOPHAGOGASTRODUODENOSCOPY (EGD) WITH PROPOFOL N/A 04/04/2013   Procedure: ESOPHAGOGASTRODUODENOSCOPY (EGD) WITH PROPOFOL;  Surgeon: Arta Silence, MD;  Location: [facility];  Service: Endoscopy;  Laterality: N/A;  . EXAMINATION UNDER ANESTHESIA N/A 08/10/2013   Procedure: RECTAL EXAM UNDER ANESTHESIA;  Surgeon: Joyice Faster. Cornett, MD;  Location: [facility];  Service: General;  Laterality: N/A;  ligation  . HEMORRHOID SURGERY    . MYRINGOTOMY     age 68     OB History   No obstetric history on file.     Family History  Problem Relation Age of Onset  . Heart disease Mother        Arrythmia  . Kidney disease Mother   . Hypertension Mother   . COPD Mother   . Diabetes Mother   . Lung cancer Mother   . Heart disease Father   . Crohn's disease Maternal Aunt   . Breast cancer Maternal Aunt        40's    Social History   Tobacco Use  . Smoking status: Current Some Day Smoker    Packs/day: 0.25    Years: 15.00    Pack years: 3.75    Types: Cigarettes  . Smokeless tobacco: Never Used  Substance Use Topics  . Alcohol use: No  . Drug use: Never    Home Medications Prior to Admission medications  Medication Sig Start Date End Date Taking? Authorizing Provider  cefdinir (OMNICEF) 300 MG capsule Take 1 capsule (300 mg total) by mouth 2 (two) times daily for 7 days. 11/13/19 11/20/19  Michela Pitcher A, PA-C  Cetirizine HCl 10 MG CAPS Take 1 capsule (10 mg total) by mouth daily. 09/22/19   Wieters, Hallie C, PA-C  fluticasone (FLONASE) 50 MCG/ACT nasal spray Place 1-2 sprays into both nostrils daily for 7 days. 09/22/19 09/29/19  Wieters, Hallie C, PA-C  naproxen (NAPROSYN) 500 MG tablet Take 1 tablet (500 mg total) by mouth 2 (two) times daily as needed (cramping). 09/22/19   Wieters, Hallie C, PA-C  sulfamethoxazole-trimethoprim (BACTRIM DS) 800-160 MG  tablet Take 1 tablet by mouth 2 (two) times daily for 7 days. 11/13/19 11/20/19  Michela Pitcher A, PA-C  SUMAtriptan (IMITREX) 6 MG/0.5ML SOLN injection Inject 0.5 mLs (6 mg total) into the skin every 2 (two) hours as needed for migraine or headache. May repeat in 2 hours if headache persists or recurs.  Please provide 10-0.5 mg doses. 03/31/19   Geoffery Lyons, MD  albuterol (PROVENTIL HFA;VENTOLIN HFA) 108 (90 Base) MCG/ACT inhaler Inhale 1-2 puffs into the lungs every 6 (six) hours as needed for wheezing or shortness of breath. Patient not taking: Reported on 03/24/2019 04/08/18 09/22/19  Long, [name], MD  potassium chloride SA (K-DUR,KLOR-CON) 20 MEQ tablet Take 2 tablets (40 mEq total) by mouth 2 (two) times daily. Patient not taking: Reported on 01/18/2018 12/10/17 09/22/19  Mesner, Barbara Cower, MD  sodium chloride (OCEAN) 0.65 % SOLN nasal spray Place 1 spray into both nostrils as needed for congestion. Patient not taking: Reported on 03/24/2019 12/14/18 09/22/19  Leretha Dykes, PA-C    Allergies    Hydrocodone-acetaminophen and Penicillins  Review of Systems   Review of Systems  Constitutional: Negative for chills and fever.  HENT: Positive for facial swelling (Eyelid swelling).   Eyes: Negative for photophobia, pain and redness.    Physical Exam Updated Vital Signs BP 123/79 (BP Location: Left Arm)   Pulse 92   Temp 98 F (36.7 C) (Oral)   Resp 18   Ht 5\' 4"  (1.626 m)   Wt 64.4 kg   SpO2 100%   BMI 24.37 kg/m   Physical Exam Vitals and nursing note reviewed.  Constitutional:      General: She is not in acute distress.    Appearance: She is well-developed.  HENT:     Head: Normocephalic and atraumatic.  Eyes:     General:        Right eye: No discharge.        Left eye: Hordeolum present.No discharge.     Extraocular Movements: Extraocular movements intact.     Conjunctiva/sclera: Conjunctivae normal.     Pupils: Pupils are equal, round, and reactive to light.     Comments: No  conjunctival injection, chemosis, proptosis or consensual photophobia.  No pain with eye movements or restriction in eye movements  Left lower eyelid with swelling and tenderness medially.  No abnormal drainage.  There is a circular area of swelling and tenderness along the lateral left upper eyelid margin, no erythema consistent with hordeolum.  On fluorescein stain, no corneal abrasion or ulceration, no foreign bodies or rust rings, no dendritic lesions.  Seidel sign absent  Neck:     Vascular: No JVD.     Trachea: No tracheal deviation.  Cardiovascular:     Rate and Rhythm: Normal rate.  Pulmonary:  Effort: Pulmonary effort is normal.  Abdominal:     General: There is no distension.  Skin:    General: Skin is warm and dry.     Findings: No erythema.  Neurological:     Mental Status: She is alert.  Psychiatric:        Behavior: Behavior normal.     ED Results / Procedures / Treatments   Labs (all labs ordered are listed, but only abnormal results are displayed) Labs Reviewed - No data to display  EKG None  Radiology No results found.  Procedures Procedures (including critical care time)  Medications Ordered in ED Medications  fluorescein ophthalmic strip 1 strip (1 strip Left Eye Given 11/13/19 1334)  tetracaine (PONTOCAINE) 0.5 % ophthalmic solution 2 drop (2 drops Left Eye Given 11/13/19 1334)    ED Course  I have reviewed the triage vital signs and the nursing notes.  Pertinent labs & imaging results that were available during my care of the patient were reviewed by me and considered in my medical decision making (see chart for details).    MDM Rules/Calculators/A&P                      Patient presenting with left eyelid swelling and tenderness for 2 days.  She is afebrile, vital signs are stable.  She is nontoxic in appearance.  Physical examination shows no evidence of corneal abrasion or ulceration, HSV ophthalmicus, orbital cellulitis or optic neuritis.   Visual acuity is equal bilaterally and she denies vision changes.  Doubt acute angle-closure glaucoma.  Physical examination is consistent with hordeolum involving the left upper eyelid as well as dacryoadenitis or dacryocystitis medially.  Will discharge with course of antibiotics.  She has tolerated cephalosporins in the past.  Recommend follow-up with ophthalmology within 48 hours.  Discussed strict ED return precautions. Patient verbalized understanding of and agreement with plan and is safe for discharge home at this time.  Final Clinical Impression(s) / ED Diagnoses Final diagnoses:  Hordeolum externum of left upper eyelid  Dacryoadenitis, acute, left    Rx / DC Orders ED Discharge Orders         Ordered    cefdinir (OMNICEF) 300 MG capsule  2 times daily     11/13/19 1357    sulfamethoxazole-trimethoprim (BACTRIM DS) 800-160 MG tablet  2 times daily     11/13/19 1357           Jeanie Sewer, PA-C 11/13/19 1359    Derwood Kaplan, MD 11/14/19 1529

## 2019-11-13 NOTE — ED Triage Notes (Signed)
Pt states he has swelling in left eye x2 days. This AM left eye was painful.

## 2019-11-13 NOTE — ED Notes (Signed)
Right Eye 20/50 Left Eye 20/50

## 2019-11-14 ENCOUNTER — Other Ambulatory Visit: Payer: Self-pay

## 2019-11-14 ENCOUNTER — Encounter (HOSPITAL_COMMUNITY): Payer: Self-pay

## 2019-11-14 ENCOUNTER — Emergency Department (HOSPITAL_COMMUNITY)
Admission: EM | Admit: 2019-11-14 | Discharge: 2019-11-14 | Disposition: A | Discharge status: Home / Self Care | Payer: 59 | Attending: Emergency Medicine | Admitting: Emergency Medicine

## 2019-11-14 DIAGNOSIS — H04302 Unspecified dacryocystitis of left lacrimal passage: Secondary | ICD-10-CM | POA: Diagnosis not present

## 2019-11-14 DIAGNOSIS — H00014 Hordeolum externum left upper eyelid: Secondary | ICD-10-CM | POA: Diagnosis not present

## 2019-11-14 DIAGNOSIS — H5712 Ocular pain, left eye: Secondary | ICD-10-CM | POA: Diagnosis present

## 2019-11-14 NOTE — ED Notes (Signed)
Pt left before getting discharge paperwork. PA made aware

## 2019-11-14 NOTE — ED Triage Notes (Signed)
Patient complains of left eye burning from raised area on outside of eye. Denies trauma, no drainage

## 2019-11-14 NOTE — ED Provider Notes (Signed)
[facility] EMERGENCY DEPARTMENT Provider Note   CSN: [id number] Arrival date & time: 11/14/19  2683     History No chief complaint on file.   Jenny Evans is a 49 y.o. female presents to ER for evaluation of continued left eye pain and swelling.  Onset 3 days ago.  Seen in the ER yesterday for this and diagnosed with hordeolum and dacryoadenitis.  She was discharged with cefdinir and bactrim which she has been taking. States this morning she woke up and the pain was still there.  She also noticed intermittent blurred vision like there was something in her eye, which resolved after blinking a few times.  She was given ophthalmologist phone number but states no one is open today on [facility] day.  She denies any fever, double vision, vision loss, eye drainage.  She has had left upper eye lid stye for over 2 weeks.   HPI     Past Medical History:  Diagnosis Date  . Anemia   . Asthma   . Blood transfusion without reported diagnosis   . Chronic pain   . GERD (gastroesophageal reflux disease)   . Migraine   . Sickle cell trait ([place])   . Ulcer     Patient Active Problem List   Diagnosis Date Noted  . Sickle cell trait ([place]) 06/14/2017  . Symptomatic anemia 06/14/2017  . Blood loss anemia 11/12/2015  . Melena 11/12/2015  . Nonspecific abnormal electrocardiogram (ECG) (EKG) 04/03/2015  . Palpitations 04/03/2015  . Neck pain 04/03/2015  . Colitis 07/10/2014  . Absolute anemia   . Post-operative state 08/26/2013  . Rectocele 08/26/2013  . Anal or rectal pain 07/29/2013  . Headache(784.0) 02/27/2012  . ACUTE BRONCHITIS 03/25/2010  . OBESITY 09/06/2009  . INSOMNIA 01/23/2009  . HYPOKALEMIA 11/01/2008  . DYSPEPSIA&OTHER [facility] DISORDERS FUNCTION STOMACH 06/07/2008  . ALLERGIC RHINITIS 03/23/2008  . CONSTIPATION 01/20/2008  . Shortness of breath 08/27/2007  . TOBACCO ABUSE 03/09/2007  . Migraine 03/09/2007  . ABSCESS, TOOTH 03/09/2007  . GERD  (gastroesophageal reflux disease) 03/09/2007  . Migraine variant 02/25/2007  . ECZEMA 02/25/2007    Past Surgical History:  Procedure Laterality Date  . ESOPHAGOGASTRODUODENOSCOPY (EGD) WITH PROPOFOL N/A 04/04/2013   Procedure: ESOPHAGOGASTRODUODENOSCOPY (EGD) WITH PROPOFOL;  Surgeon: Arta Silence, MD;  Location: [facility];  Service: Endoscopy;  Laterality: N/A;  . EXAMINATION UNDER ANESTHESIA N/A 08/10/2013   Procedure: RECTAL EXAM UNDER ANESTHESIA;  Surgeon: Joyice Faster. Cornett, MD;  Location: [place];  Service: General;  Laterality: N/A;  ligation  . HEMORRHOID SURGERY    . MYRINGOTOMY     age 73     OB History   No obstetric history on file.     Family History  Problem Relation Age of Onset  . Heart disease Mother        Arrythmia  . Kidney disease Mother   . Hypertension Mother   . COPD Mother   . Diabetes Mother   . Lung cancer Mother   . Heart disease Father   . Crohn's disease Maternal Aunt   . Breast cancer Maternal Aunt        40's    Social History   Tobacco Use  . Smoking status: Current Some Day Smoker    Packs/day: 0.25    Years: 15.00    Pack years: 3.75    Types: Cigarettes  . Smokeless tobacco: Never Used  Substance Use Topics  . Alcohol use: No  .  Drug use: Never    Home Medications Prior to Admission medications   Medication Sig Start Date End Date Taking? Authorizing Provider  cefdinir (OMNICEF) 300 MG capsule Take 1 capsule (300 mg total) by mouth 2 (two) times daily for 7 days. 11/13/19 11/20/19  Michela Pitcher A, PA-C  Cetirizine HCl 10 MG CAPS Take 1 capsule (10 mg total) by mouth daily. 09/22/19   Wieters, Hallie C, PA-C  fluticasone (FLONASE) 50 MCG/ACT nasal spray Place 1-2 sprays into both nostrils daily for 7 days. 09/22/19 09/29/19  Wieters, Hallie C, PA-C  naproxen (NAPROSYN) 500 MG tablet Take 1 tablet (500 mg total) by mouth 2 (two) times daily as needed (cramping). 09/22/19   Wieters, Hallie C, PA-C   sulfamethoxazole-trimethoprim (BACTRIM DS) 800-160 MG tablet Take 1 tablet by mouth 2 (two) times daily for 7 days. 11/13/19 11/20/19  Michela Pitcher A, PA-C  SUMAtriptan (IMITREX) 6 MG/0.5ML SOLN injection Inject 0.5 mLs (6 mg total) into the skin every 2 (two) hours as needed for migraine or headache. May repeat in 2 hours if headache persists or recurs.  Please provide 10-0.5 mg doses. 03/31/19   Geoffery Lyons, MD  albuterol (PROVENTIL HFA;VENTOLIN HFA) 108 (90 Base) MCG/ACT inhaler Inhale 1-2 puffs into the lungs every 6 (six) hours as needed for wheezing or shortness of breath. Patient not taking: Reported on 03/24/2019 04/08/18 09/22/19  Long, [name], MD  potassium chloride SA (K-DUR,KLOR-CON) 20 MEQ tablet Take 2 tablets (40 mEq total) by mouth 2 (two) times daily. Patient not taking: Reported on 01/18/2018 12/10/17 09/22/19  Mesner, Barbara Cower, MD  sodium chloride (OCEAN) 0.65 % SOLN nasal spray Place 1 spray into both nostrils as needed for congestion. Patient not taking: Reported on 03/24/2019 12/14/18 09/22/19  Carlyle Basques P, PA-C    Allergies    Hydrocodone-acetaminophen and Penicillins  Review of Systems   Review of Systems  Eyes: Positive for pain.  All other systems reviewed and are negative.   Physical Exam Updated Vital Signs BP (!) 115/58 (BP Location: Left Arm)   Pulse 61   Temp 98.2 F (36.8 C) (Oral)   Resp 16   Ht 5\' 4"  (1.626 m)   Wt 63.5 kg   SpO2 98%   BMI 24.03 kg/m   Physical Exam Constitutional:      Appearance: She is well-developed.  HENT:     Head: Normocephalic.     Nose: Nose normal.  Eyes:     General: Lids are normal.      Comments: Left upper eye lid with firm, non tender non fluctuance hordeolum. No signs of infection.  Left lower eye lid medially with rounded, firm, tender nodule.  No drainage or bleeding with pressure. Very mild reactive swelling and tenderness along medial lower eyelid.  Full ROM of eyes bilaterally, Painless.  PERRL.  No  conjunctiva/scleral injection, chemosis, proptosis.    Cardiovascular:     Rate and Rhythm: Normal rate.  Pulmonary:     Effort: Pulmonary effort is normal. No respiratory distress.  Musculoskeletal:        General: Normal range of motion.     Cervical back: Normal range of motion.  Neurological:     Mental Status: She is alert.  Psychiatric:        Behavior: Behavior normal.     ED Results / Procedures / Treatments   Labs (all labs ordered are listed, but only abnormal results are displayed) Labs Reviewed - No data to display  EKG None  Radiology No results found.  Procedures Procedures (including critical care time)  Medications Ordered in ED Medications - No data to display  ED Course  I have reviewed the triage vital signs and the nursing notes.  Pertinent labs & imaging results that were available during my care of the patient were reviewed by me and considered in my medical decision making (see chart for details).    MDM Rules/Calculators/A&P                      Patient presents with infected and inflammed lacrimal duct for 3 days. Seen in the ER yesterday, she is on appropriate antibiotics.  Reports intermittent blurred vision.  Exam reassuring. Afebrile. No signs of periorbital or preseptal cellulitis.  No proptosis, chemosis.  Clinical picture not consistent with CRAO, CRVO, optic neuritis, retinal detachment, open globe, foreign body, abrasion, acute angle closure glaucoma, endophthalmitis, uveitis/iritis, temporal arteritis, conjunctivitis. She has minimal lower eyelid edema, tenderness likely from reactive inflammation.  No abscess.  Discussed importance of following up with ophthalmologist as there is not much we can do in the ER.  Encourage warm compresses, NSAID, head elevation and Similason eye drops.  Explained symptoms that would warrant return to ER.  I was notified by RN patient walked out without AVS.   Final Clinical Impression(s) / ED  Diagnoses Final diagnoses:  Infection of left tear duct    Rx / DC Orders ED Discharge Orders    None       Liberty Handy, PA-C 11/14/19 1054    Mancel Bale, MD 11/14/19 1642

## 2020-01-23 ENCOUNTER — Other Ambulatory Visit: Payer: Self-pay

## 2020-01-23 ENCOUNTER — Encounter (HOSPITAL_COMMUNITY): Payer: Self-pay | Admitting: Emergency Medicine

## 2020-01-23 ENCOUNTER — Ambulatory Visit (HOSPITAL_COMMUNITY)
Admission: EM | Admit: 2020-01-23 | Discharge: 2020-01-23 | Disposition: A | Discharge status: Home / Self Care | Payer: HRSA Program | Attending: Family Medicine | Admitting: Family Medicine

## 2020-01-23 DIAGNOSIS — Z20822 Contact with and (suspected) exposure to covid-19: Secondary | ICD-10-CM | POA: Diagnosis present

## 2020-01-23 DIAGNOSIS — B349 Viral infection, unspecified: Secondary | ICD-10-CM | POA: Diagnosis present

## 2020-01-23 NOTE — ED Triage Notes (Addendum)
Diarrhea intermittent for 3 days.  Intermittent vomiting.  Denies fever , cough, or runny nose.    Patient has found out a cousin is covid positive

## 2020-01-23 NOTE — ED Provider Notes (Signed)
[facility]    CSN: [id number] Arrival date & time: 01/23/20  1639      History   Chief Complaint Chief Complaint  Patient presents with  . Diarrhea    HPI Jenny Evans is a 49 y.o. female.   HPI   Patient is here for an illness.  States has "sinus" with runny stuffy nose and some sore throat.  No fever chills.  No headache or body aches.  Also has had some diarrhea.  Was exposed "last week" to a cousin with Covid.  Has had symptoms for 2 to 3 days.  Would like Covid testing.  Has not had vaccinations  Past Medical History:  Diagnosis Date  . Anemia   . Asthma   . Blood transfusion without reported diagnosis   . Chronic pain   . GERD (gastroesophageal reflux disease)   . Migraine   . Sickle cell trait (HCC)   . Ulcer     Patient Active Problem List   Diagnosis Date Noted  . Sickle cell trait (HCC) 06/14/2017  . Symptomatic anemia 06/14/2017  . Blood loss anemia 11/12/2015  . Melena 11/12/2015  . Nonspecific abnormal electrocardiogram (ECG) (EKG) 04/03/2015  . Palpitations 04/03/2015  . Neck pain 04/03/2015  . Colitis 07/10/2014  . Absolute anemia   . Post-operative state 08/26/2013  . Rectocele 08/26/2013  . Anal or rectal pain 07/29/2013  . Headache(784.0) 02/27/2012  . ACUTE BRONCHITIS 03/25/2010  . OBESITY 09/06/2009  . INSOMNIA 01/23/2009  . HYPOKALEMIA 11/01/2008  . DYSPEPSIA&OTHER [facility] DISORDERS FUNCTION STOMACH 06/07/2008  . ALLERGIC RHINITIS 03/23/2008  . CONSTIPATION 01/20/2008  . Shortness of breath 08/27/2007  . TOBACCO ABUSE 03/09/2007  . Migraine 03/09/2007  . ABSCESS, TOOTH 03/09/2007  . GERD (gastroesophageal reflux disease) 03/09/2007  . Migraine variant 02/25/2007  . ECZEMA 02/25/2007    Past Surgical History:  Procedure Laterality Date  . ESOPHAGOGASTRODUODENOSCOPY (EGD) WITH PROPOFOL N/A 04/04/2013   Procedure: ESOPHAGOGASTRODUODENOSCOPY (EGD) WITH PROPOFOL;  Surgeon: Willis Modena, MD;  Location: [facility];   Service: Endoscopy;  Laterality: N/A;  . EXAMINATION UNDER ANESTHESIA N/A 08/10/2013   Procedure: RECTAL EXAM UNDER ANESTHESIA;  Surgeon: Clovis Pu. Cornett, MD;  Location: [facility];  Service: General;  Laterality: N/A;  ligation  . HEMORRHOID SURGERY    . MYRINGOTOMY     age 59    OB History   No obstetric history on file.      Home Medications    Prior to Admission medications   Medication Sig Start Date End Date Taking? Authorizing Provider  fluticasone (FLONASE) 50 MCG/ACT nasal spray Place 1-2 sprays into both nostrils daily for 7 days. 09/22/19 09/29/19  Wieters, Hallie C, PA-C  albuterol (PROVENTIL HFA;VENTOLIN HFA) 108 (90 Base) MCG/ACT inhaler Inhale 1-2 puffs into the lungs every 6 (six) hours as needed for wheezing or shortness of breath. Patient not taking: Reported on 03/24/2019 04/08/18 09/22/19  Long, [name], MD  Cetirizine HCl 10 MG CAPS Take 1 capsule (10 mg total) by mouth daily. 09/22/19 01/23/20  Wieters, Hallie C, PA-C  potassium chloride SA (K-DUR,KLOR-CON) 20 MEQ tablet Take 2 tablets (40 mEq total) by mouth 2 (two) times daily. Patient not taking: Reported on 01/18/2018 12/10/17 09/22/19  Mesner, Barbara Cower, MD  sodium chloride (OCEAN) 0.65 % SOLN nasal spray Place 1 spray into both nostrils as needed for congestion. Patient not taking: Reported on 03/24/2019 12/14/18 09/22/19  Carlyle Basques P, PA-C  SUMAtriptan (IMITREX) 6 MG/0.5ML SOLN injection Inject  0.5 mLs (6 mg total) into the skin every 2 (two) hours as needed for migraine or headache. May repeat in 2 hours if headache persists or recurs.  Please provide 10-0.5 mg doses. 03/31/19 01/23/20  Geoffery Lyons, MD    Family History Family History  Problem Relation Age of Onset  . Heart disease Mother        Arrythmia  . Kidney disease Mother   . Hypertension Mother   . COPD Mother   . Diabetes Mother   . Lung cancer Mother   . Heart disease Father   . Crohn's disease Maternal Aunt   . Breast cancer Maternal  Aunt        77's    Social History Social History   Tobacco Use  . Smoking status: Current Some Day Smoker    Packs/day: 0.25    Years: 15.00    Pack years: 3.75    Types: Cigarettes  . Smokeless tobacco: Never Used  Vaping Use  . Vaping Use: Never used  Substance Use Topics  . Alcohol use: No  . Drug use: Never     Allergies   Hydrocodone-acetaminophen and Penicillins   Review of Systems Review of Systems See HPI  Physical Exam Triage Vital Signs ED Triage Vitals [01/23/20 1811]  Enc Vitals Group     BP      Pulse      Resp      Temp      Temp src      SpO2      Weight      Height      Head Circumference      Peak Flow      Pain Score 3     Pain Loc      Pain Edu?      Excl. in GC?    No data found.  Updated Vital Signs LMP 01/09/2020      Physical Exam Constitutional:      General: She is not in acute distress.    Appearance: She is well-developed.  HENT:     Head: Normocephalic and atraumatic.  Eyes:     Conjunctiva/sclera: Conjunctivae normal.     Pupils: Pupils are equal, round, and reactive to light.  Cardiovascular:     Rate and Rhythm: Normal rate and regular rhythm.     Heart sounds: Normal heart sounds.  Pulmonary:     Effort: Pulmonary effort is normal. No respiratory distress.     Breath sounds: Normal breath sounds.  Abdominal:     General: There is no distension.     Palpations: Abdomen is soft.  Musculoskeletal:        General: Normal range of motion.     Cervical back: Normal range of motion.  Skin:    General: Skin is warm and dry.  Neurological:     Mental Status: She is alert.  Psychiatric:        Mood and Affect: Mood normal.        Behavior: Behavior normal.    Unremarkable exam  UC Treatments / Results  Labs (all labs ordered are listed, but only abnormal results are displayed) Labs Reviewed  SARS CORONAVIRUS 2 (TAT 6-24 HRS)    EKG   Radiology No results found.  Procedures Procedures (including  critical care time)  Medications Ordered in UC Medications - No data to display  Initial Impression / Assessment and Plan / UC Course  I have reviewed the triage vital  signs and the nursing notes.  Pertinent labs & imaging results that were available during my care of the patient were reviewed by me and considered in my medical decision making (see chart for details).     Discussed with the patient the importance of quarantine until test results are available Final Clinical Impressions(s) / UC Diagnoses   Final diagnoses:  Close exposure to COVID-19 virus  Viral illness     Discharge Instructions     Go home to rest Drink plenty of fluids Take Tylenol for pain or fever You may take over-the-counter cough and cold medicines as needed You must quarantine at home until your test result is available You can check for your test result in MyChart     ED Prescriptions    None     PDMP not reviewed this encounter.   Eustace Moore, MD 01/23/20 2017

## 2020-01-23 NOTE — Discharge Instructions (Signed)
Go home to rest Drink plenty of fluids Take Tylenol for pain or fever You may take over-the-counter cough and cold medicines as needed You must quarantine at home until your test result is available You can check for your test result in MyChart  

## 2020-01-24 LAB — SARS CORONAVIRUS 2 (TAT 6-24 HRS): SARS Coronavirus 2: NEGATIVE

## 2020-02-17 ENCOUNTER — Emergency Department (HOSPITAL_COMMUNITY)
Admission: EM | Admit: 2020-02-17 | Discharge: 2020-02-17 | Disposition: A | Discharge status: Home / Self Care | Payer: Self-pay | Attending: Emergency Medicine | Admitting: Emergency Medicine

## 2020-02-17 ENCOUNTER — Other Ambulatory Visit: Payer: Self-pay

## 2020-02-17 ENCOUNTER — Encounter (HOSPITAL_COMMUNITY): Payer: Self-pay

## 2020-02-17 DIAGNOSIS — Z5321 Procedure and treatment not carried out due to patient leaving prior to being seen by health care provider: Secondary | ICD-10-CM | POA: Insufficient documentation

## 2020-02-17 DIAGNOSIS — R109 Unspecified abdominal pain: Secondary | ICD-10-CM | POA: Insufficient documentation

## 2020-02-17 DIAGNOSIS — R197 Diarrhea, unspecified: Secondary | ICD-10-CM | POA: Insufficient documentation

## 2020-02-17 HISTORY — DX: Peptic ulcer, site unspecified, unspecified as acute or chronic, without hemorrhage or perforation: K27.9

## 2020-02-17 NOTE — ED Triage Notes (Signed)
Patient c/o mid abdominal pain and diarrhea since yesterday. Patient states a history of ulcers.

## 2020-09-23 ENCOUNTER — Emergency Department (HOSPITAL_BASED_OUTPATIENT_CLINIC_OR_DEPARTMENT_OTHER)
Admission: EM | Admit: 2020-09-23 | Discharge: 2020-09-23 | Disposition: A | Discharge status: Home / Self Care | Payer: Self-pay | Attending: Emergency Medicine | Admitting: Emergency Medicine

## 2020-09-23 ENCOUNTER — Encounter (HOSPITAL_BASED_OUTPATIENT_CLINIC_OR_DEPARTMENT_OTHER): Payer: Self-pay

## 2020-09-23 DIAGNOSIS — F17210 Nicotine dependence, cigarettes, uncomplicated: Secondary | ICD-10-CM | POA: Insufficient documentation

## 2020-09-23 DIAGNOSIS — J45909 Unspecified asthma, uncomplicated: Secondary | ICD-10-CM | POA: Insufficient documentation

## 2020-09-23 DIAGNOSIS — D509 Iron deficiency anemia, unspecified: Secondary | ICD-10-CM | POA: Insufficient documentation

## 2020-09-23 DIAGNOSIS — Z7952 Long term (current) use of systemic steroids: Secondary | ICD-10-CM | POA: Insufficient documentation

## 2020-09-23 LAB — BASIC METABOLIC PANEL
Anion gap: 9 (ref 5–15)
BUN: 6 mg/dL (ref 6–20)
CO2: 23 mmol/L (ref 22–32)
Calcium: 8.9 mg/dL (ref 8.9–10.3)
Chloride: 104 mmol/L (ref 98–111)
Creatinine, Ser: 0.63 mg/dL (ref 0.44–1.00)
GFR, Estimated: >60 mL/min (ref >60)
Glucose, Bld: 94 mg/dL (ref 70–99)
Potassium: 3.7 mmol/L (ref 3.5–5.1)
Sodium: 136 mmol/L (ref 135–145)

## 2020-09-23 LAB — CBC WITH DIFFERENTIAL/PLATELET
Abs Immature Granulocytes: 0.01 10*3/uL (ref 0.00–0.07)
Basophils Absolute: 0.1 10*3/uL (ref 0.0–0.1)
Basophils Relative: 1 %
Eosinophils Absolute: 0.1 10*3/uL (ref 0.0–0.5)
Eosinophils Relative: 1 %
HCT: 26.7 % — ABNORMAL LOW (ref 36.0–46.0)
Hemoglobin: 8.5 g/dL — ABNORMAL LOW (ref 12.0–15.0)
Immature Granulocytes: 0 %
Lymphocytes Relative: 28 %
Lymphs Abs: 1.9 10*3/uL (ref 0.7–4.0)
MCH: 23.5 pg — ABNORMAL LOW (ref 26.0–34.0)
MCHC: 31.8 g/dL (ref 30.0–36.0)
MCV: 73.8 fL — ABNORMAL LOW (ref 80.0–100.0)
Monocytes Absolute: 0.6 10*3/uL (ref 0.1–1.0)
Monocytes Relative: 9 %
Neutro Abs: 4.1 10*3/uL (ref 1.7–7.7)
Neutrophils Relative %: 61 %
Platelets: 409 10*3/uL — ABNORMAL HIGH (ref 150–400)
RBC: 3.62 MIL/uL — ABNORMAL LOW (ref 3.87–5.11)
RDW: 21.7 % — ABNORMAL HIGH (ref 11.5–15.5)
Smear Review: NORMAL
WBC: 6.7 10*3/uL (ref 4.0–10.5)
nRBC: 0 % (ref 0.0–0.2)

## 2020-09-23 MED ORDER — FERROUS SULFATE 325 (65 FE) MG PO TABS: 325.0000 mg | ORAL_TABLET | Freq: Two times a day (BID) | ORAL | 0 refills | Status: DC

## 2020-09-23 NOTE — ED Provider Notes (Signed)
[facility] EMERGENCY DEPARTMENT Provider Note   CSN: [id number] Arrival date & time: 09/23/20  8242     History Chief Complaint  Patient presents with  . Weakness    Jenny Evans is a 50 y.o. female.  HPI Patient presents with concern for anemia.  States she is been feeling weak and tired over the last couple weeks.  Has a history of anemia that is apparently iron deficient.  Has had both oral iron and iron infusions.  Has had transfusions in the past.  States she does not have particularly heavy menses.  Does not quite know what her baseline hemoglobin is.  No chest pain.  No fevers or chills.  No swelling her legs.    Past Medical History:  Diagnosis Date  . Anemia   . Asthma   . Blood transfusion without reported diagnosis   . Chronic pain   . GERD (gastroesophageal reflux disease)   . Migraine   . Peptic ulcer   . Sickle cell trait (HCC)   . Ulcer     Patient Active Problem List   Diagnosis Date Noted  . Sickle cell trait (HCC) 06/14/2017  . Symptomatic anemia 06/14/2017  . Blood loss anemia 11/12/2015  . Melena 11/12/2015  . Nonspecific abnormal electrocardiogram (ECG) (EKG) 04/03/2015  . Palpitations 04/03/2015  . Neck pain 04/03/2015  . Colitis 07/10/2014  . Absolute anemia   . Post-operative state 08/26/2013  . Rectocele 08/26/2013  . Anal or rectal pain 07/29/2013  . Headache(784.0) 02/27/2012  . ACUTE BRONCHITIS 03/25/2010  . OBESITY 09/06/2009  . INSOMNIA 01/23/2009  . HYPOKALEMIA 11/01/2008  . DYSPEPSIA&OTHER [facility] DISORDERS FUNCTION STOMACH 06/07/2008  . ALLERGIC RHINITIS 03/23/2008  . CONSTIPATION 01/20/2008  . Shortness of breath 08/27/2007  . TOBACCO ABUSE 03/09/2007  . Migraine 03/09/2007  . ABSCESS, TOOTH 03/09/2007  . GERD (gastroesophageal reflux disease) 03/09/2007  . Migraine variant 02/25/2007  . ECZEMA 02/25/2007    Past Surgical History:  Procedure Laterality Date  . ESOPHAGOGASTRODUODENOSCOPY (EGD) WITH PROPOFOL  N/A 04/04/2013   Procedure: ESOPHAGOGASTRODUODENOSCOPY (EGD) WITH PROPOFOL;  Surgeon: Willis Modena, MD;  Location: [facility];  Service: Endoscopy;  Laterality: N/A;  . EXAMINATION UNDER ANESTHESIA N/A 08/10/2013   Procedure: RECTAL EXAM UNDER ANESTHESIA;  Surgeon: Clovis Pu. Cornett, MD;  Location: [facility];  Service: General;  Laterality: N/A;  ligation  . HEMORRHOID SURGERY    . MYRINGOTOMY     age 44     OB History   No obstetric history on file.     Family History  Problem Relation Age of Onset  . Heart disease Mother        Arrythmia  . Kidney disease Mother   . Hypertension Mother   . COPD Mother   . Diabetes Mother   . Lung cancer Mother   . Heart disease Father   . Crohn's disease Maternal Aunt   . Breast cancer Maternal Aunt        64's    Social History   Tobacco Use  . Smoking status: Current Some Day Smoker    Packs/day: 0.25    Years: 15.00    Pack years: 3.75    Types: Cigarettes  . Smokeless tobacco: Never Used  Vaping Use  . Vaping Use: Never used  Substance Use Topics  . Alcohol use: No  . Drug use: Never    Home Medications Prior to Admission medications   Medication Sig Start Date End Date Taking? Authorizing  Provider  ferrous sulfate 325 (65 FE) MG tablet Take 1 tablet (325 mg total) by mouth 2 (two) times daily with a meal. 09/23/20  Yes Benjiman Core, MD  fluticasone (FLONASE) 50 MCG/ACT nasal spray Place 1-2 sprays into both nostrils daily for 7 days. 09/22/19 09/29/19  Wieters, Hallie C, PA-C  albuterol (PROVENTIL HFA;VENTOLIN HFA) 108 (90 Base) MCG/ACT inhaler Inhale 1-2 puffs into the lungs every 6 (six) hours as needed for wheezing or shortness of breath. Patient not taking: Reported on 03/24/2019 04/08/18 09/22/19  Long, [name], MD  Cetirizine HCl 10 MG CAPS Take 1 capsule (10 mg total) by mouth daily. 09/22/19 01/23/20  Wieters, Hallie C, PA-C  potassium chloride SA (K-DUR,KLOR-CON) 20 MEQ tablet Take 2 tablets (40 mEq  total) by mouth 2 (two) times daily. Patient not taking: Reported on 01/18/2018 12/10/17 09/22/19  Mesner, Barbara Cower, MD  sodium chloride (OCEAN) 0.65 % SOLN nasal spray Place 1 spray into both nostrils as needed for congestion. Patient not taking: Reported on 03/24/2019 12/14/18 09/22/19  Carlyle Basques P, PA-C  SUMAtriptan (IMITREX) 6 MG/0.5ML SOLN injection Inject 0.5 mLs (6 mg total) into the skin every 2 (two) hours as needed for migraine or headache. May repeat in 2 hours if headache persists or recurs.  Please provide 10-0.5 mg doses. 03/31/19 01/23/20  Geoffery Lyons, MD    Allergies    Hydrocodone-acetaminophen and Penicillins  Review of Systems   Review of Systems  Constitutional: Positive for fatigue. Negative for appetite change.  HENT: Negative for congestion.   Respiratory: Positive for shortness of breath.   Gastrointestinal: Negative for abdominal pain.  Genitourinary: Negative for flank pain.  Musculoskeletal: Negative for back pain.  Skin: Negative for rash.  Neurological: Positive for light-headedness.  Psychiatric/Behavioral: Negative for confusion.    Physical Exam Updated Vital Signs BP 107/78   Pulse 74   Temp 98.6 F (37 C) (Oral)   Resp 16   Ht 5\' 5"  (1.651 m)   Wt 54.4 kg   LMP 09/16/2020   SpO2 99%   BMI 19.97 kg/m   Physical Exam Vitals and nursing note reviewed.  HENT:     Head: Atraumatic.  Eyes:     Pupils: Pupils are equal, round, and reactive to light.  Cardiovascular:     Rate and Rhythm: Regular rhythm.  Pulmonary:     Breath sounds: No wheezing or rhonchi.  Abdominal:     Tenderness: There is no abdominal tenderness.  Musculoskeletal:        General: No tenderness.     Cervical back: Neck supple.  Skin:    General: Skin is warm.     Capillary Refill: Capillary refill takes less than 2 seconds.     Coloration: Skin is not pale.  Neurological:     Mental Status: She is alert.     ED Results / Procedures / Treatments   Labs (all labs  ordered are listed, but only abnormal results are displayed) Labs Reviewed  CBC WITH DIFFERENTIAL/PLATELET - Abnormal; Notable for the following components:      Result Value   RBC 3.62 (*)    Hemoglobin 8.5 (*)    HCT 26.7 (*)    MCV 73.8 (*)    MCH 23.5 (*)    RDW 21.7 (*)    Platelets 409 (*)    All other components within normal limits  BASIC METABOLIC PANEL    EKG None  Radiology No results found.  Procedures Procedures   Medications  Ordered in ED Medications - No data to display  ED Course  I have reviewed the triage vital signs and the nursing notes.  Pertinent labs & imaging results that were available during my care of the patient were reviewed by me and considered in my medical decision making (see chart for details).    MDM Rules/Calculators/A&P                          Patient with history of iron deficiency anemia.  States she feels as if she is anemic and.  Hemoglobin actually appears to be near baseline right now.  Hemoglobin 8.5.  However it is microcytic.  We will add iron.  Follow-up with PCP for further treatment.  Do not feel she needs admission to the hospital at this time. Final Clinical Impression(s) / ED Diagnoses Final diagnoses:  Iron deficiency anemia, unspecified iron deficiency anemia type    Rx / DC Orders ED Discharge Orders         Ordered    ferrous sulfate 325 (65 FE) MG tablet  2 times daily with meals        09/23/20 1056           Benjiman Core, MD 09/23/20 1510

## 2020-09-23 NOTE — ED Triage Notes (Signed)
Pt reports feeling more tired and weak x2 weeks. States "normally when I get like this, my blood is low." Pt ambulated to room. A&Ox4. Denies dizziness/lightheadedness

## 2020-09-23 NOTE — Discharge Instructions (Signed)
Your hemoglobin is likely low due to iron deficiency follow-up with your doctor.

## 2020-10-03 ENCOUNTER — Other Ambulatory Visit: Payer: Self-pay

## 2020-10-03 ENCOUNTER — Emergency Department (HOSPITAL_BASED_OUTPATIENT_CLINIC_OR_DEPARTMENT_OTHER)
Admission: EM | Admit: 2020-10-03 | Discharge: 2020-10-03 | Disposition: A | Discharge status: Home / Self Care | Payer: Self-pay | Attending: Emergency Medicine | Admitting: Emergency Medicine

## 2020-10-03 ENCOUNTER — Encounter (HOSPITAL_BASED_OUTPATIENT_CLINIC_OR_DEPARTMENT_OTHER): Payer: Self-pay

## 2020-10-03 DIAGNOSIS — Z20822 Contact with and (suspected) exposure to covid-19: Secondary | ICD-10-CM | POA: Insufficient documentation

## 2020-10-03 DIAGNOSIS — J45909 Unspecified asthma, uncomplicated: Secondary | ICD-10-CM | POA: Insufficient documentation

## 2020-10-03 DIAGNOSIS — F17210 Nicotine dependence, cigarettes, uncomplicated: Secondary | ICD-10-CM | POA: Insufficient documentation

## 2020-10-03 DIAGNOSIS — J069 Acute upper respiratory infection, unspecified: Secondary | ICD-10-CM | POA: Insufficient documentation

## 2020-10-03 NOTE — ED Provider Notes (Signed)
[facility] EMERGENCY DEPARTMENT Provider Note   CSN: [id number] Arrival date & time: 10/03/20  1440     History Chief Complaint  Patient presents with  . Cough    Jenny Evans is a 50 y.o. female.  50 year old female with history of iron deficiency anemia, presents with complaint of sinus pressure and congestion with dry cough x3 days.  Reports limited relief with OTC Advil sinus.  No known sick contacts, has been vaccinated against COVID with a booster as well as flu. No other complaints or concerns.        Past Medical History:  Diagnosis Date  . Anemia   . Asthma   . Blood transfusion without reported diagnosis   . Chronic pain   . GERD (gastroesophageal reflux disease)   . Migraine   . Peptic ulcer   . Sickle cell trait (HCC)   . Ulcer     Patient Active Problem List   Diagnosis Date Noted  . Sickle cell trait (HCC) 06/14/2017  . Symptomatic anemia 06/14/2017  . Blood loss anemia 11/12/2015  . Melena 11/12/2015  . Nonspecific abnormal electrocardiogram (ECG) (EKG) 04/03/2015  . Palpitations 04/03/2015  . Neck pain 04/03/2015  . Colitis 07/10/2014  . Absolute anemia   . Post-operative state 08/26/2013  . Rectocele 08/26/2013  . Anal or rectal pain 07/29/2013  . Headache(784.0) 02/27/2012  . ACUTE BRONCHITIS 03/25/2010  . OBESITY 09/06/2009  . INSOMNIA 01/23/2009  . HYPOKALEMIA 11/01/2008  . DYSPEPSIA&OTHER [facility] DISORDERS FUNCTION STOMACH 06/07/2008  . ALLERGIC RHINITIS 03/23/2008  . CONSTIPATION 01/20/2008  . Shortness of breath 08/27/2007  . TOBACCO ABUSE 03/09/2007  . Migraine 03/09/2007  . ABSCESS, TOOTH 03/09/2007  . GERD (gastroesophageal reflux disease) 03/09/2007  . Migraine variant 02/25/2007  . ECZEMA 02/25/2007    Past Surgical History:  Procedure Laterality Date  . ESOPHAGOGASTRODUODENOSCOPY (EGD) WITH PROPOFOL N/A 04/04/2013   Procedure: ESOPHAGOGASTRODUODENOSCOPY (EGD) WITH PROPOFOL;  Surgeon: Willis Modena, MD;   Location: [facility];  Service: Endoscopy;  Laterality: N/A;  . EXAMINATION UNDER ANESTHESIA N/A 08/10/2013   Procedure: RECTAL EXAM UNDER ANESTHESIA;  Surgeon: Clovis Pu. Cornett, MD;  Location: [facility];  Service: General;  Laterality: N/A;  ligation  . HEMORRHOID SURGERY    . MYRINGOTOMY     age 73     OB History   No obstetric history on file.     Family History  Problem Relation Age of Onset  . Heart disease Mother        Arrythmia  . Kidney disease Mother   . Hypertension Mother   . COPD Mother   . Diabetes Mother   . Lung cancer Mother   . Heart disease Father   . Crohn's disease Maternal Aunt   . Breast cancer Maternal Aunt        50's    Social History   Tobacco Use  . Smoking status: Current Some Day Smoker    Packs/day: 0.25    Years: 15.00    Pack years: 3.75    Types: Cigarettes  . Smokeless tobacco: Never Used  Vaping Use  . Vaping Use: Never used  Substance Use Topics  . Alcohol use: No  . Drug use: Never    Home Medications Prior to Admission medications   Medication Sig Start Date End Date Taking? Authorizing Provider  ferrous sulfate 325 (65 FE) MG tablet Take 1 tablet (325 mg total) by mouth 2 (two) times daily with a meal. 09/23/20  Benjiman Core, MD  fluticasone [facility]) 50 MCG/ACT nasal spray Place 1-2 sprays into both nostrils daily for 7 days. 09/22/19 09/29/19  Wieters, Hallie C, PA-C  albuterol (PROVENTIL HFA;VENTOLIN HFA) 108 (90 Base) MCG/ACT inhaler Inhale 1-2 puffs into the lungs every 6 (six) hours as needed for wheezing or shortness of breath. Patient not taking: Reported on 03/24/2019 04/08/18 09/22/19  Long, [name], MD  Cetirizine HCl 10 MG CAPS Take 1 capsule (10 mg total) by mouth daily. 09/22/19 01/23/20  Wieters, Hallie C, PA-C  potassium chloride SA (K-DUR,KLOR-CON) 20 MEQ tablet Take 2 tablets (40 mEq total) by mouth 2 (two) times daily. Patient not taking: Reported on 01/18/2018 12/10/17 09/22/19  Mesner, Barbara Cower, MD   sodium chloride (OCEAN) 0.65 % SOLN nasal spray Place 1 spray into both nostrils as needed for congestion. Patient not taking: Reported on 03/24/2019 12/14/18 09/22/19  Carlyle Basques P, PA-C  SUMAtriptan (IMITREX) 6 MG/0.5ML SOLN injection Inject 0.5 mLs (6 mg total) into the skin every 2 (two) hours as needed for migraine or headache. May repeat in 2 hours if headache persists or recurs.  Please provide 10-0.5 mg doses. 03/31/19 01/23/20  Geoffery Lyons, MD    Allergies    Hydrocodone-acetaminophen and Penicillins  Review of Systems   Review of Systems  Constitutional: Negative for chills and fever.  HENT: Positive for congestion and sinus pressure. Negative for ear pain and sore throat.   Respiratory: Positive for cough. Negative for shortness of breath.   Gastrointestinal: Negative for nausea and vomiting.  Musculoskeletal: Negative for arthralgias and myalgias.  Skin: Negative for rash and wound.  Allergic/Immunologic: Negative for immunocompromised state.  Hematological: Negative for adenopathy.  Psychiatric/Behavioral: Negative for confusion.  All other systems reviewed and are negative.   Physical Exam Updated Vital Signs BP 112/85 (BP Location: Left Arm)   Pulse 88   Temp 98.3 F (36.8 C) (Oral)   Resp 16   LMP 09/16/2020   SpO2 100%   Physical Exam Vitals and nursing note reviewed.  Constitutional:      General: She is not in acute distress.    Appearance: She is well-developed. She is not diaphoretic.  HENT:     Head: Normocephalic and atraumatic.     Right Ear: Tympanic membrane and ear canal normal.     Left Ear: Tympanic membrane and ear canal normal.     Nose: Congestion present.     Mouth/Throat:     Mouth: Mucous membranes are moist.     Pharynx: No oropharyngeal exudate or posterior oropharyngeal erythema.  Eyes:     Conjunctiva/sclera: Conjunctivae normal.  Cardiovascular:     Rate and Rhythm: Normal rate and regular rhythm.     Heart sounds: Normal  heart sounds.  Pulmonary:     Effort: Pulmonary effort is normal.     Breath sounds: Normal breath sounds.  Musculoskeletal:     Cervical back: Neck supple. No tenderness.  Skin:    General: Skin is warm and dry.     Findings: No erythema or rash.  Neurological:     Mental Status: She is alert and oriented to person, place, and time.  Psychiatric:        Behavior: Behavior normal.     ED Results / Procedures / Treatments   Labs (all labs ordered are listed, but only abnormal results are displayed) Labs Reviewed  RESP PANEL BY RT-PCR (FLU A&B, COVID) [id number]    EKG None  Radiology No results found.  Procedures Procedures   Medications Ordered in ED Medications - No data to display  ED Course  I have reviewed the triage vital signs and the nursing notes.  Pertinent labs & imaging results that were available during my care of the patient were reviewed by me and considered in my medical decision making (see chart for details).  Clinical Course as of 10/03/20 1504  Wed Oct 03, 2020  10114 50 year old female with viral URI symptoms x3 days.  Well-appearing on exam with nasal congestion.  Plan is to test for COVID/flu. Recommend Advil sinus (ask the pharmacist) with Flonase and Zyrtec.  Add saline sinus rinse if not improving. If positive for flu, will discuss possible Tamiflu. [LM]    Clinical Course User Index [LM] Alden Hipp   MDM Rules/Calculators/A&P                          Final Clinical Impression(s) / ED Diagnoses Final diagnoses:  Viral URI with cough    Rx / DC Orders ED Discharge Orders    None       Jeannie Fend, PA-C 10/03/20 1505    Milagros Loll, MD 10/04/20 1316

## 2020-10-03 NOTE — Discharge Instructions (Addendum)
Ask the pharmacist for Advil Sinus (or similar product), kept behind the shelf, has sudafed in it and will work better than the over the counter products. Take above with Zyrtec daily, Flonase twice daily for 5 days then continue with daily Flonase. IF not improving with above measures, add in twice daily sinus rinse.  Monitor your MyChart account for your COVID/flu test results which should be available in the next 2 to 3 hours.  If your COVID test is positive, monitor and quarantine per guidelines.  If your flu test is positive, you will have the option to start Tamiflu today.

## 2020-10-03 NOTE — ED Triage Notes (Signed)
Pt c/o flu like sx x 3 days-NAD-steady gait ?

## 2020-10-04 LAB — SARS CORONAVIRUS 2 (TAT 6-24 HRS): SARS Coronavirus 2: NEGATIVE

## 2020-11-03 ENCOUNTER — Emergency Department (HOSPITAL_COMMUNITY)
Admission: EM | Admit: 2020-11-03 | Discharge: 2020-11-03 | Disposition: A | Discharge status: Home / Self Care | Payer: No Typology Code available for payment source | Attending: Emergency Medicine | Admitting: Emergency Medicine

## 2020-11-03 DIAGNOSIS — J45909 Unspecified asthma, uncomplicated: Secondary | ICD-10-CM | POA: Insufficient documentation

## 2020-11-03 DIAGNOSIS — T148XXA Other injury of unspecified body region, initial encounter: Secondary | ICD-10-CM

## 2020-11-03 DIAGNOSIS — F17210 Nicotine dependence, cigarettes, uncomplicated: Secondary | ICD-10-CM | POA: Diagnosis not present

## 2020-11-03 DIAGNOSIS — S6992XA Unspecified injury of left wrist, hand and finger(s), initial encounter: Secondary | ICD-10-CM | POA: Diagnosis present

## 2020-11-03 DIAGNOSIS — S61432A Puncture wound without foreign body of left hand, initial encounter: Secondary | ICD-10-CM | POA: Insufficient documentation

## 2020-11-03 DIAGNOSIS — Y990 Civilian activity done for income or pay: Secondary | ICD-10-CM | POA: Diagnosis not present

## 2020-11-03 DIAGNOSIS — X58XXXA Exposure to other specified factors, initial encounter: Secondary | ICD-10-CM | POA: Diagnosis not present

## 2020-11-03 MED ORDER — HYDROCODONE-ACETAMINOPHEN 5-325 MG PO TABS: 1.0000 | ORAL_TABLET | ORAL | 0 refills | Status: DC | PRN

## 2020-11-03 MED ORDER — HYDROMORPHONE HCL 2 MG/ML IJ SOLN
2.0000 mg | Freq: Once | INTRAMUSCULAR | Status: AC
  Administered 2020-11-03: 2 mg via SUBCUTANEOUS
  Filled 2020-11-03: qty 1

## 2020-11-03 NOTE — ED Provider Notes (Signed)
[facility]-EMERGENCY DEPT Provider Note   CSN: [id number] Arrival date & time: 11/03/20  8850     History Chief Complaint  Patient presents with  . Hand Pain    Jenny Evans is a 50 y.o. female.  HPI She presents for evaluation of left hand pain, gradually worse over the last 3 days.  She had a puncture wound, treated on 10/26/2020, with oral antibiotics and pain medicine.  She was referred to hand surgery for removal of a metallic foreign body and saw them 5 days ago.  They plan on doing surgery however has not been finalized secondary to waiting for authorization from [employer].  She is run out of her medications.  She denies fever, chills, nausea or vomiting.  There are no other known active modifying factors.    Past Medical History:  Diagnosis Date  . Anemia   . Asthma   . Blood transfusion without reported diagnosis   . Chronic pain   . GERD (gastroesophageal reflux disease)   . Migraine   . Peptic ulcer   . Sickle cell trait (HCC)   . Ulcer     Patient Active Problem List   Diagnosis Date Noted  . Sickle cell trait (HCC) 06/14/2017  . Symptomatic anemia 06/14/2017  . Blood loss anemia 11/12/2015  . Melena 11/12/2015  . Nonspecific abnormal electrocardiogram (ECG) (EKG) 04/03/2015  . Palpitations 04/03/2015  . Neck pain 04/03/2015  . Colitis 07/10/2014  . Absolute anemia   . Post-operative state 08/26/2013  . Rectocele 08/26/2013  . Anal or rectal pain 07/29/2013  . Headache(784.0) 02/27/2012  . ACUTE BRONCHITIS 03/25/2010  . OBESITY 09/06/2009  . INSOMNIA 01/23/2009  . HYPOKALEMIA 11/01/2008  . DYSPEPSIA&OTHER [facility] DISORDERS FUNCTION STOMACH 06/07/2008  . ALLERGIC RHINITIS 03/23/2008  . CONSTIPATION 01/20/2008  . Shortness of breath 08/27/2007  . TOBACCO ABUSE 03/09/2007  . Migraine 03/09/2007  . ABSCESS, TOOTH 03/09/2007  . GERD (gastroesophageal reflux disease) 03/09/2007  . Migraine variant 02/25/2007  . ECZEMA  02/25/2007    Past Surgical History:  Procedure Laterality Date  . ESOPHAGOGASTRODUODENOSCOPY (EGD) WITH PROPOFOL N/A 04/04/2013   Procedure: ESOPHAGOGASTRODUODENOSCOPY (EGD) WITH PROPOFOL;  Surgeon: Willis Modena, MD;  Location: [facility];  Service: Endoscopy;  Laterality: N/A;  . EXAMINATION UNDER ANESTHESIA N/A 08/10/2013   Procedure: RECTAL EXAM UNDER ANESTHESIA;  Surgeon: Clovis Pu. Cornett, MD;  Location: [facility];  Service: General;  Laterality: N/A;  ligation  . HEMORRHOID SURGERY    . MYRINGOTOMY     age 71     OB History   No obstetric history on file.     Family History  Problem Relation Age of Onset  . Heart disease Mother        Arrythmia  . Kidney disease Mother   . Hypertension Mother   . COPD Mother   . Diabetes Mother   . Lung cancer Mother   . Heart disease Father   . Crohn's disease Maternal Aunt   . Breast cancer Maternal Aunt        85's    Social History   Tobacco Use  . Smoking status: Current Some Day Smoker    Packs/day: 0.25    Years: 15.00    Pack years: 3.75    Types: Cigarettes  . Smokeless tobacco: Never Used  Vaping Use  . Vaping Use: Never used  Substance Use Topics  . Alcohol use: No  . Drug use: Never    Home  Medications Prior to Admission medications   Medication Sig Start Date End Date Taking? Authorizing Provider  HYDROcodone-acetaminophen (NORCO/VICODIN) 5-325 MG tablet Take 1 tablet by mouth every 4 (four) hours as needed. 11/03/20  Yes Mancel Bale, MD  ferrous sulfate 325 (65 FE) MG tablet Take 1 tablet (325 mg total) by mouth 2 (two) times daily with a meal. 09/23/20   Benjiman Core, MD  fluticasone (FLONASE) 50 MCG/ACT nasal spray Place 1-2 sprays into both nostrils daily for 7 days. 09/22/19 09/29/19  Wieters, Hallie C, PA-C  albuterol (PROVENTIL HFA;VENTOLIN HFA) 108 (90 Base) MCG/ACT inhaler Inhale 1-2 puffs into the lungs every 6 (six) hours as needed for wheezing or shortness of  breath. Patient not taking: Reported on 03/24/2019 04/08/18 09/22/19  Long, [name], MD  Cetirizine HCl 10 MG CAPS Take 1 capsule (10 mg total) by mouth daily. 09/22/19 01/23/20  Wieters, Hallie C, PA-C  potassium chloride SA (K-DUR,KLOR-CON) 20 MEQ tablet Take 2 tablets (40 mEq total) by mouth 2 (two) times daily. Patient not taking: Reported on 01/18/2018 12/10/17 09/22/19  Mesner, Barbara Cower, MD  sodium chloride (OCEAN) 0.65 % SOLN nasal spray Place 1 spray into both nostrils as needed for congestion. Patient not taking: Reported on 03/24/2019 12/14/18 09/22/19  Carlyle Basques P, PA-C  SUMAtriptan (IMITREX) 6 MG/0.5ML SOLN injection Inject 0.5 mLs (6 mg total) into the skin every 2 (two) hours as needed for migraine or headache. May repeat in 2 hours if headache persists or recurs.  Please provide 10-0.5 mg doses. 03/31/19 01/23/20  Geoffery Lyons, MD    Allergies    Hydrocodone-acetaminophen and Penicillins  Review of Systems   Review of Systems  All other systems reviewed and are negative.   Physical Exam Updated Vital Signs BP (!) 117/92 (BP Location: Left Arm)   Pulse 77   Temp 98.1 F (36.7 C) (Oral)   Resp 16   Ht 5\' 4"  (1.626 m)   Wt 59 kg   SpO2 100%   BMI 22.31 kg/m   Physical Exam Vitals and nursing note reviewed.  Constitutional:      General: She is not in acute distress.    Appearance: She is well-developed. She is not ill-appearing, toxic-appearing or diaphoretic.  HENT:     Head: Normocephalic and atraumatic.     Right Ear: External ear normal.     Left Ear: External ear normal.  Eyes:     Conjunctiva/sclera: Conjunctivae normal.     Pupils: Pupils are equal, round, and reactive to light.  Neck:     Trachea: Phonation normal.  Cardiovascular:     Rate and Rhythm: Normal rate.  Pulmonary:     Effort: Pulmonary effort is normal.  Abdominal:     General: There is no distension.  Musculoskeletal:     Cervical back: Normal range of motion and neck supple.     Comments:  Left hand is bandaged with gauze and Coban.  This bandage was removed, to expose the hand, and the wound which is in the left first webspace.  There is a closed puncture, on the dorsal aspect, near the edge of the webspace.  There is mild associated swelling of the first webspace.  The patient is holding her left hand and fingers in a fairly rigid fashion, and resists any movement secondary to pain.  She does not demonstrate ability to flex or extend the fingers or the hand.  There is no erythema, drainage from the wound, bleeding, or other visible deformity.  Skin:    General: Skin is warm and dry.  Neurological:     Mental Status: She is alert and oriented to person, place, and time.     Cranial Nerves: No cranial nerve deficit.     Sensory: No sensory deficit.     Motor: No abnormal muscle tone.     Coordination: Coordination normal.  Psychiatric:        Mood and Affect: Mood normal.        Behavior: Behavior normal.        Thought Content: Thought content normal.        Judgment: Judgment normal.     ED Results / Procedures / Treatments   Labs (all labs ordered are listed, but only abnormal results are displayed) Labs Reviewed - No data to display  EKG None  Radiology No results found.  Procedures Procedures   Medications Ordered in ED Medications  HYDROmorphone (DILAUDID) injection 2 mg (2 mg Subcutaneous Given 11/03/20 0915)    ED Course  I have reviewed the triage vital signs and the nursing notes.  Pertinent labs & imaging results that were available during my care of the patient were reviewed by me and considered in my medical decision making (see chart for details).    MDM Rules/Calculators/A&P                           Patient Vitals for the past 24 hrs:  BP Temp Temp src Pulse Resp SpO2 Height Weight  11/03/20 0837 -- -- -- -- -- -- 5\' 4"  (1.626 m) 59 kg  11/03/20 0833 (!) 117/92 98.1 F (36.7 C) Oral 77 16 100 % -- --    11:02 AM Reevaluation with  update and discussion. After initial assessment and treatment, an updated evaluation reveals she reports that her pain is better after treatment.  Findings discussed with her and all questions were answered. Mancel BaleElliott Dawaun Brancato   Medical Decision Making:  This patient is presenting for evaluation of left hand pain, with known foreign body, which does require a range of treatment options, and is a complaint that involves a moderate risk of morbidity and mortality. The differential diagnoses include complications from puncture wound, wound infection, pain from puncture wound. I decided to review old records, and in summary middle-aged female being evaluated by hand surgery for foreign body in the left hand following a work-related incident.  Currently she is awaiting operative intervention until authorization can be obtained.  She is apparently working light duty. I did not require additional historical information from anyone.    Critical Interventions-clinical evaluation, medication treatment, observation, Ace wrap and sling ordered, disposition planning arranged  After These Interventions, the Patient was reevaluated and was found with hand pain after puncture wound.  Wound does not appear infected.  Patient's hand is being held in a slightly flexed position, which is probably contributing to her discomfort.  She demonstrates some ability to move the fingers, but it is limited by pain.  There is no concern at this time for infection, or need for further ED interventions.  I have encouraged her to begin some gentle range of motion exercises with warm soaks, to improve her discomfort.  She will be supplied with Ace wrap and sling to use for times when she is not soaking.  She will be counseled to not use narcotics when driving or working.  CRITICAL CARE-no Performed by: Mancel BaleElliott Sakshi Sermons  Nursing Notes Reviewed/ Care Coordinated  Applicable Imaging Reviewed Interpretation of Laboratory Data incorporated into  ED treatment  The patient appears reasonably screened and/or stabilized for discharge and I doubt any other medical condition or other [facility] requiring further screening, evaluation, or treatment in the ED at this time prior to discharge.  Plan: Home Medications-OTC as needed; Home Treatments-warm soaks 3 times daily, Ace wrap and sling as needed; return here if the recommended treatment, does not improve the symptoms; Recommended follow up-orthopedic hand surgery as scheduled.      Final Clinical Impression(s) / ED Diagnoses Final diagnoses:  Puncture wound    Rx / DC Orders ED Discharge Orders         Ordered    HYDROcodone-acetaminophen (NORCO/VICODIN) 5-325 MG tablet  Every 4 hours PRN        11/03/20 1106           Mancel Bale, MD 11/03/20 1107

## 2020-11-03 NOTE — ED Triage Notes (Signed)
Patient reports injuring left hand on 10/26/20 at work, metal in hand. Patient awaiting surgery, waiting on workmans comp to come through. Patient says pain is 9/10 today and presents to ED for pain in left hand.

## 2020-11-03 NOTE — Discharge Instructions (Addendum)
Follow-up with your orthopedic hand surgeon as scheduled for further care and treatment.  Use the Ace wrap and sling as needed for comfort.  To help your hand discomfort improved, soak it in warm water for about 30 minutes, 3-4 times a day.  We are sending a prescription for pain reliever to your pharmacy.  Do not work or drive when taking the narcotic pain reliever.  At times when you are not working, use ibuprofen or acetaminophen for pain.

## 2020-11-03 NOTE — ED Notes (Signed)
Applied ace wrap and arm sling to left hand.

## 2020-11-09 ENCOUNTER — Emergency Department (HOSPITAL_BASED_OUTPATIENT_CLINIC_OR_DEPARTMENT_OTHER)
Admission: EM | Admit: 2020-11-09 | Discharge: 2020-11-09 | Disposition: A | Discharge status: Home / Self Care | Payer: No Typology Code available for payment source | Attending: Emergency Medicine | Admitting: Emergency Medicine

## 2020-11-09 ENCOUNTER — Other Ambulatory Visit: Payer: Self-pay

## 2020-11-09 ENCOUNTER — Other Ambulatory Visit (HOSPITAL_BASED_OUTPATIENT_CLINIC_OR_DEPARTMENT_OTHER): Payer: Self-pay

## 2020-11-09 ENCOUNTER — Encounter (HOSPITAL_BASED_OUTPATIENT_CLINIC_OR_DEPARTMENT_OTHER): Payer: Self-pay

## 2020-11-09 DIAGNOSIS — M795 Residual foreign body in soft tissue: Secondary | ICD-10-CM | POA: Diagnosis not present

## 2020-11-09 DIAGNOSIS — J45909 Unspecified asthma, uncomplicated: Secondary | ICD-10-CM | POA: Insufficient documentation

## 2020-11-09 DIAGNOSIS — F17210 Nicotine dependence, cigarettes, uncomplicated: Secondary | ICD-10-CM | POA: Diagnosis not present

## 2020-11-09 DIAGNOSIS — M79642 Pain in left hand: Secondary | ICD-10-CM | POA: Diagnosis present

## 2020-11-09 MED ORDER — OXYCODONE HCL 5 MG PO TABS: 5.0000 mg | ORAL_TABLET | ORAL | 0 refills | Status: DC | PRN

## 2020-11-09 MED ORDER — OXYCODONE HCL 5 MG PO TABS
5.0000 mg | ORAL_TABLET | ORAL | 0 refills | Status: DC | PRN
  Filled 2020-11-09: qty 3, 1d supply, fill #0

## 2020-11-09 NOTE — Discharge Instructions (Addendum)
Take Tylenol (acetaminophen) 1000 mg 4 times a day for 1 week. This is the maximum dose of Tylenol you can take from all sources. Please check other over-the-counter medications and prescriptions to ensure you are not taking other medications that contain acetaminophen.  You may also take ibuprofen 400 mg 6 times a day alternating with or at the same time as tylenol.  Take oxycodone as needed for breakthrough pain.  This medication can be addicting, sedating and cause constipation.   °

## 2020-11-09 NOTE — ED Provider Notes (Signed)
[facility] EMERGENCY DEPARTMENT Provider Note   CSN: [id number] Arrival date & time: 11/09/20  0820     History Chief Complaint  Patient presents with  . Hand Pain    Jenny Evans is a 50 y.o. female.  HPI     50yo female with history below including history of injury to left hand while hammering metal 5/13 with metallic foreign body who presents with concern for continuing pain.  Denies fever, drainage. She is scheduled to have hand surgery soon. She ran out of medications and is not having relief with OTC medications.  Doing dressing changes once daily and exercises.  Reports pain is severe and persistent.      Past Medical History:  Diagnosis Date  . Anemia   . Asthma   . Blood transfusion without reported diagnosis   . Chronic pain   . GERD (gastroesophageal reflux disease)   . Migraine   . Peptic ulcer   . Sickle cell trait (HCC)   . Ulcer     Patient Active Problem List   Diagnosis Date Noted  . Sickle cell trait (HCC) 06/14/2017  . Symptomatic anemia 06/14/2017  . Blood loss anemia 11/12/2015  . Melena 11/12/2015  . Nonspecific abnormal electrocardiogram (ECG) (EKG) 04/03/2015  . Palpitations 04/03/2015  . Neck pain 04/03/2015  . Colitis 07/10/2014  . Absolute anemia   . Post-operative state 08/26/2013  . Rectocele 08/26/2013  . Anal or rectal pain 07/29/2013  . Headache(784.0) 02/27/2012  . ACUTE BRONCHITIS 03/25/2010  . OBESITY 09/06/2009  . INSOMNIA 01/23/2009  . HYPOKALEMIA 11/01/2008  . DYSPEPSIA&OTHER [facility] DISORDERS FUNCTION STOMACH 06/07/2008  . ALLERGIC RHINITIS 03/23/2008  . CONSTIPATION 01/20/2008  . Shortness of breath 08/27/2007  . TOBACCO ABUSE 03/09/2007  . Migraine 03/09/2007  . ABSCESS, TOOTH 03/09/2007  . GERD (gastroesophageal reflux disease) 03/09/2007  . Migraine variant 02/25/2007  . ECZEMA 02/25/2007    Past Surgical History:  Procedure Laterality Date  . ESOPHAGOGASTRODUODENOSCOPY (EGD) WITH PROPOFOL  N/A 04/04/2013   Procedure: ESOPHAGOGASTRODUODENOSCOPY (EGD) WITH PROPOFOL;  Surgeon: Willis Modena, MD;  Location: [facility];  Service: Endoscopy;  Laterality: N/A;  . EXAMINATION UNDER ANESTHESIA N/A 08/10/2013   Procedure: RECTAL EXAM UNDER ANESTHESIA;  Surgeon: Clovis Pu. Cornett, MD;  Location: [facility];  Service: General;  Laterality: N/A;  ligation  . HEMORRHOID SURGERY    . MYRINGOTOMY     age 73     OB History   No obstetric history on file.     Family History  Problem Relation Age of Onset  . Heart disease Mother        Arrythmia  . Kidney disease Mother   . Hypertension Mother   . COPD Mother   . Diabetes Mother   . Lung cancer Mother   . Heart disease Father   . Crohn's disease Maternal Aunt   . Breast cancer Maternal Aunt        69's    Social History   Tobacco Use  . Smoking status: Current Some Day Smoker    Packs/day: 0.25    Years: 15.00    Pack years: 3.75    Types: Cigarettes  . Smokeless tobacco: Never Used  Vaping Use  . Vaping Use: Never used  Substance Use Topics  . Alcohol use: Yes    Comment: social  . Drug use: Never    Home Medications Prior to Admission medications   Medication Sig Start Date End Date Taking? Authorizing Provider  ferrous sulfate 325 (65 FE) MG tablet Take 1 tablet (325 mg total) by mouth 2 (two) times daily with a meal. 09/23/20   Benjiman Core, MD  fluticasone (FLONASE) 50 MCG/ACT nasal spray Place 1-2 sprays into both nostrils daily for 7 days. 09/22/19 09/29/19  Wieters, Hallie C, PA-C  HYDROcodone-acetaminophen (NORCO/VICODIN) 5-325 MG tablet Take 1 tablet by mouth every 4 (four) hours as needed. 11/03/20   Mancel Bale, MD  oxyCODONE (ROXICODONE) 5 MG immediate release tablet Take 1 tablet (5 mg total) by mouth every 4 (four) hours as needed for severe pain. 11/09/20   Alvira Monday, MD  albuterol (PROVENTIL HFA;VENTOLIN HFA) 108 (90 Base) MCG/ACT inhaler Inhale 1-2 puffs into the lungs every  6 (six) hours as needed for wheezing or shortness of breath. Patient not taking: Reported on 03/24/2019 04/08/18 09/22/19  Long, [name], MD  Cetirizine HCl 10 MG CAPS Take 1 capsule (10 mg total) by mouth daily. 09/22/19 01/23/20  Wieters, Hallie C, PA-C  potassium chloride SA (K-DUR,KLOR-CON) 20 MEQ tablet Take 2 tablets (40 mEq total) by mouth 2 (two) times daily. Patient not taking: Reported on 01/18/2018 12/10/17 09/22/19  Mesner, Barbara Cower, MD  sodium chloride (OCEAN) 0.65 % SOLN nasal spray Place 1 spray into both nostrils as needed for congestion. Patient not taking: Reported on 03/24/2019 12/14/18 09/22/19  Carlyle Basques P, PA-C  SUMAtriptan (IMITREX) 6 MG/0.5ML SOLN injection Inject 0.5 mLs (6 mg total) into the skin every 2 (two) hours as needed for migraine or headache. May repeat in 2 hours if headache persists or recurs.  Please provide 10-0.5 mg doses. 03/31/19 01/23/20  Geoffery Lyons, MD    Allergies    Hydrocodone-acetaminophen and Penicillins  Review of Systems   Review of Systems  Constitutional: Negative for fever.  Gastrointestinal: Negative for nausea and vomiting.  Musculoskeletal: Positive for arthralgias.  Skin: Positive for wound.  Neurological: Negative for headaches.    Physical Exam Updated Vital Signs BP 108/62 (BP Location: Right Arm)   Pulse 78   Temp 98.8 F (37.1 C) (Oral)   Resp 18   Ht 5\' 4"  (1.626 m)   Wt 59 kg   LMP 11/09/2020   SpO2 100%   BMI 22.31 kg/m   Physical Exam Vitals and nursing note reviewed.  Constitutional:      General: She is not in acute distress.    Appearance: Normal appearance. She is not ill-appearing, toxic-appearing or diaphoretic.  HENT:     Head: Normocephalic.  Eyes:     Conjunctiva/sclera: Conjunctivae normal.  Cardiovascular:     Rate and Rhythm: Normal rate and regular rhythm.     Pulses: Normal pulses.  Pulmonary:     Effort: Pulmonary effort is normal. No respiratory distress.  Musculoskeletal:        General:  Tenderness (thenar eminence) present. No deformity or signs of injury.     Cervical back: No rigidity.     Comments: Healing puncture wound web space left hand No drainage, no fluctuance, no erythema Flexion of all fingers limited by pain   Skin:    General: Skin is warm and dry.     Coloration: Skin is not jaundiced or pale.  Neurological:     General: No focal deficit present.     Mental Status: She is alert and oriented to person, place, and time.     ED Results / Procedures / Treatments   Labs (all labs ordered are listed, but only abnormal results are displayed) Labs  Reviewed - No data to display  EKG None  Radiology No results found.  Procedures Procedures   Medications Ordered in ED Medications - No data to display  ED Course  I have reviewed the triage vital signs and the nursing notes.  Pertinent labs & imaging results that were available during my care of the patient were reviewed by me and considered in my medical decision making (see chart for details).    MDM Rules/Calculators/A&P                           50yo female with history below including history of injury to left hand while hammering metal 5/13 with metallic foreign body who presents with concern for continuing pain.  Do not see signs of infection  Recommend nonnarcotic pain medications but after discussion did write rx for 3 tablets of oxycodone given soft tissue injury with pain. Recommend taking ibuprofen/tylenol, only using one of the oxycodone for severe limited pain> recommend follow up with her hand surgeon.  Final Clinical Impression(s) / ED Diagnoses Final diagnoses:  Foreign body (FB) in soft tissue    Rx / DC Orders ED Discharge Orders         Ordered    oxyCODONE (ROXICODONE) 5 MG immediate release tablet  Every 4 hours PRN,   Status:  Discontinued        11/09/20 1010    oxyCODONE (ROXICODONE) 5 MG immediate release tablet  Every 4 hours PRN        11/09/20 1013            Alvira Monday, MD 11/09/20 2244

## 2020-11-09 NOTE — ED Triage Notes (Signed)
Pt arrives with continued pain to left hand from event on the 13th of May pt reports having metal in her hand states that she is supposed to have surgery next week was told to keep the area clean dry and wrapped.

## 2021-04-25 ENCOUNTER — Other Ambulatory Visit (HOSPITAL_COMMUNITY): Payer: Self-pay

## 2021-07-30 ENCOUNTER — Other Ambulatory Visit (HOSPITAL_BASED_OUTPATIENT_CLINIC_OR_DEPARTMENT_OTHER): Payer: Self-pay

## 2021-07-30 ENCOUNTER — Other Ambulatory Visit: Payer: Self-pay

## 2021-07-30 ENCOUNTER — Encounter (HOSPITAL_BASED_OUTPATIENT_CLINIC_OR_DEPARTMENT_OTHER): Payer: Self-pay

## 2021-07-30 ENCOUNTER — Emergency Department (HOSPITAL_BASED_OUTPATIENT_CLINIC_OR_DEPARTMENT_OTHER)
Admission: EM | Admit: 2021-07-30 | Discharge: 2021-07-30 | Disposition: A | Discharge status: Home / Self Care | Payer: Self-pay | Attending: Emergency Medicine | Admitting: Emergency Medicine

## 2021-07-30 DIAGNOSIS — H60501 Unspecified acute noninfective otitis externa, right ear: Secondary | ICD-10-CM

## 2021-07-30 DIAGNOSIS — H6091 Unspecified otitis externa, right ear: Secondary | ICD-10-CM | POA: Insufficient documentation

## 2021-07-30 DIAGNOSIS — J45909 Unspecified asthma, uncomplicated: Secondary | ICD-10-CM | POA: Insufficient documentation

## 2021-07-30 MED ORDER — FLUTICASONE PROPIONATE 50 MCG/ACT NA SUSP
2.0000 | Freq: Every day | NASAL | 0 refills | Status: DC
  Filled 2021-07-30: qty 16, 30d supply, fill #0

## 2021-07-30 MED ORDER — NEOMYCIN-POLYMYXIN-HC 3.5-10000-1 OT SUSP
4.0000 [drp] | Freq: Three times a day (TID) | OTIC | 0 refills | Status: AC
  Filled 2021-07-30: qty 10, 17d supply, fill #0

## 2021-07-30 NOTE — Discharge Instructions (Signed)
You were seen in the emergency room today with drainage from the right ear.  I am starting you on antibiotic drops.  Please follow closely with the primary care doctor.  Return with any new or suddenly worsening symptoms.

## 2021-07-30 NOTE — ED Provider Notes (Signed)
° °  Emergency Department Provider Note   I have reviewed the triage vital signs and the nursing notes.   HISTORY  Chief Complaint Ear Pain   HPI Jenny Evans is a 51 y.o. female with past medical history reviewed below presents emergency department for evaluation of right ear pain and intermittent drainage.  Symptoms been ongoing for the past 2 weeks.  She has some associated nasal congestion and sinusitis.  Denies any chest pain or cough.  Denies any blood from the ear canal.  She has itching in the right ear and then will intermittently have drainage coming from the ear that she has to wipe away.    Past Medical History:  Diagnosis Date   Anemia    Asthma    Blood transfusion without reported diagnosis    Chronic pain    GERD (gastroesophageal reflux disease)    Migraine    Peptic ulcer    Sickle cell trait (HCC)    Ulcer     Review of Systems  Constitutional: No fever/chills Eyes: No visual changes. ENT: No sore throat. Drainage from the right ear.  Cardiovascular: Denies chest pain. Respiratory: Denies shortness of breath. Gastrointestinal: No abdominal pain. Skin: Negative for rash. Neurological: Negative for headaches.   ____________________________________________   PHYSICAL EXAM:  VITAL SIGNS: ED Triage Vitals [07/30/21 0740]  Enc Vitals Group     BP 132/64     Pulse Rate (!) 39     Resp 18     Temp 98.1 F (36.7 C)     Temp Source Oral     SpO2 100 %    Constitutional: Alert and oriented. Well appearing and in no acute distress. Eyes: Conjunctivae are normal.  Head: Atraumatic. Ears:  Healthy appearing TMs bilaterally. Scant discharge in the right external canal.  Nose: Mild congestion/rhinnorhea. Mouth/Throat: Mucous membranes are moist.  Oropharynx non-erythematous. Neck: No stridor.  Cardiovascular:  Good peripheral circulation. Respiratory: Normal respiratory effort.  Gastrointestinal: Soft and nontender. No distention.   Musculoskeletal: No gross deformities of extremities. Neurologic:  Normal speech and language.  Skin:  Skin is warm, dry and intact. No rash noted.   ____________________________________________   PROCEDURES  Procedure(s) performed:   Procedures  None ____________________________________________   INITIAL IMPRESSION / ASSESSMENT AND PLAN / ED COURSE  Pertinent labs & imaging results that were available during my care of the patient were reviewed by me and considered in my medical decision making (see chart for details).  Patient presents emergency department evaluation of drainage from the right ear with some discomfort and nasal congestion.  Exam does not seem consistent with TM perforation, otitis media, mastoiditis.  Plan for treatment with otic drops and Flonase.  Discussed PCP follow-up plan.  Disposition: discharge   ____________________________________________  FINAL CLINICAL IMPRESSION(S) / ED DIAGNOSES  Final diagnoses:  Acute otitis externa of right ear, unspecified type     NEW OUTPATIENT MEDICATIONS STARTED DURING THIS VISIT:  New Prescriptions   FLUTICASONE (FLONASE) 50 MCG/ACT NASAL SPRAY    Place 2 sprays into both nostrils daily for 7 days.   NEOMYCIN-POLYMYXIN-HYDROCORTISONE (CORTISPORIN) 3.5-10000-1 OTIC SUSPENSION    Place 4 drops into the right ear 3 (three) times daily for 7 days.    Note:  This document was prepared using Dragon voice recognition software and may include unintentional dictation errors.  Alona Bene, MD, [facility] Emergency Medicine    [name], [name], MD 07/30/21 [phone]

## 2021-07-30 NOTE — ED Triage Notes (Addendum)
Pt reports right ear for at least 2 weeks and has gotten worse. Sinus drainage as well

## 2021-08-10 ENCOUNTER — Other Ambulatory Visit: Payer: Self-pay

## 2021-08-10 ENCOUNTER — Emergency Department (HOSPITAL_BASED_OUTPATIENT_CLINIC_OR_DEPARTMENT_OTHER)
Admission: EM | Admit: 2021-08-10 | Discharge: 2021-08-10 | Disposition: A | Discharge status: Home / Self Care | Payer: Self-pay | Attending: Student | Admitting: Student

## 2021-08-10 ENCOUNTER — Encounter (HOSPITAL_BASED_OUTPATIENT_CLINIC_OR_DEPARTMENT_OTHER): Payer: Self-pay

## 2021-08-10 DIAGNOSIS — Z7951 Long term (current) use of inhaled steroids: Secondary | ICD-10-CM | POA: Insufficient documentation

## 2021-08-10 DIAGNOSIS — Z20822 Contact with and (suspected) exposure to covid-19: Secondary | ICD-10-CM | POA: Insufficient documentation

## 2021-08-10 DIAGNOSIS — R5383 Other fatigue: Secondary | ICD-10-CM | POA: Insufficient documentation

## 2021-08-10 DIAGNOSIS — J45909 Unspecified asthma, uncomplicated: Secondary | ICD-10-CM | POA: Insufficient documentation

## 2021-08-10 DIAGNOSIS — F17210 Nicotine dependence, cigarettes, uncomplicated: Secondary | ICD-10-CM | POA: Insufficient documentation

## 2021-08-10 DIAGNOSIS — Z79899 Other long term (current) drug therapy: Secondary | ICD-10-CM | POA: Insufficient documentation

## 2021-08-10 DIAGNOSIS — E876 Hypokalemia: Secondary | ICD-10-CM | POA: Insufficient documentation

## 2021-08-10 DIAGNOSIS — D649 Anemia, unspecified: Secondary | ICD-10-CM

## 2021-08-10 LAB — COMPREHENSIVE METABOLIC PANEL
ALT: 18 U/L (ref 0–44)
AST: 26 U/L (ref 15–41)
Albumin: 3.7 g/dL (ref 3.5–5.0)
Alkaline Phosphatase: 53 U/L (ref 38–126)
Anion gap: 7 (ref 5–15)
BUN: 10 mg/dL (ref 6–20)
CO2: 24 mmol/L (ref 22–32)
Calcium: 9 mg/dL (ref 8.9–10.3)
Chloride: 106 mmol/L (ref 98–111)
Creatinine, Ser: 0.59 mg/dL (ref 0.44–1.00)
GFR, Estimated: >60 mL/min (ref >60)
Glucose, Bld: 98 mg/dL (ref 70–99)
Potassium: 3.2 mmol/L — ABNORMAL LOW (ref 3.5–5.1)
Sodium: 137 mmol/L (ref 135–145)
Total Bilirubin: 0.3 mg/dL (ref 0.3–1.2)
Total Protein: 6.9 g/dL (ref 6.5–8.1)

## 2021-08-10 LAB — CBC WITH DIFFERENTIAL/PLATELET
Abs Immature Granulocytes: 0 10*3/uL (ref 0.00–0.07)
Basophils Absolute: 0 10*3/uL (ref 0.0–0.1)
Basophils Relative: 1 %
Eosinophils Absolute: 0 10*3/uL (ref 0.0–0.5)
Eosinophils Relative: 0 %
HCT: 25.4 % — ABNORMAL LOW (ref 36.0–46.0)
Hemoglobin: 8.1 g/dL — ABNORMAL LOW (ref 12.0–15.0)
Immature Granulocytes: 0 %
Lymphocytes Relative: 33 %
Lymphs Abs: 1.5 10*3/uL (ref 0.7–4.0)
MCH: 22.3 pg — ABNORMAL LOW (ref 26.0–34.0)
MCHC: 31.9 g/dL (ref 30.0–36.0)
MCV: 69.8 fL — ABNORMAL LOW (ref 80.0–100.0)
Monocytes Absolute: 0.4 10*3/uL (ref 0.1–1.0)
Monocytes Relative: 10 %
Neutro Abs: 2.5 10*3/uL (ref 1.7–7.7)
Neutrophils Relative %: 56 %
Platelets: 533 10*3/uL — ABNORMAL HIGH (ref 150–400)
RBC: 3.64 MIL/uL — ABNORMAL LOW (ref 3.87–5.11)
RDW: 21.4 % — ABNORMAL HIGH (ref 11.5–15.5)
Smear Review: NORMAL
WBC: 4.4 10*3/uL (ref 4.0–10.5)
nRBC: 0 % (ref 0.0–0.2)

## 2021-08-10 LAB — PREGNANCY, URINE: Preg Test, Ur: NEGATIVE

## 2021-08-10 LAB — RESP PANEL BY RT-PCR (FLU A&B, COVID) ARPGX2
Influenza A by PCR: NEGATIVE
Influenza B by PCR: NEGATIVE
SARS Coronavirus 2 by RT PCR: NEGATIVE

## 2021-08-10 NOTE — ED Triage Notes (Signed)
Pt c/o sleeping all the time, thinks her "blood might be low". Hx of anemia.

## 2021-08-10 NOTE — ED Provider Notes (Signed)
[facility] EMERGENCY DEPARTMENT Provider Note  CSN: [id number] Arrival date & time: 08/10/21 2620  Chief Complaint(s) Fatigue  HPI Jenny Evans is a 51 y.o. female with PMH iron deficiency anemia, peptic ulcer disease secondary to [facility] powder, sickle cell trait who presents the emergency department for evaluation of generalized fatigue.  She states that she feels that her blood counts are low.  She states that 3 weeks ago she had a 2-week period of dark stools and abdominal pain after taking BC powder and the symptoms have since resolved.  She states over the last 1 week she has felt more fatigued and comes to the emergency department for laboratory testing.  She denies current black stools and endorses noncompliance with her home iron supplementation.  She does not have a primary care physician or home hematologist.  Currently denies chest pain, shortness of breath, abdominal pain, nausea, vomiting, hematochezia, hematemesis or other systemic symptoms.  HPI  Past Medical History Past Medical History:  Diagnosis Date   Anemia    Asthma    Blood transfusion without reported diagnosis    Chronic pain    GERD (gastroesophageal reflux disease)    Migraine    Peptic ulcer    Sickle cell trait (HCC)    Ulcer    Patient Active Problem List   Diagnosis Date Noted   Sickle cell trait (HCC) 06/14/2017   Symptomatic anemia 06/14/2017   Blood loss anemia 11/12/2015   Melena 11/12/2015   Nonspecific abnormal electrocardiogram (ECG) (EKG) 04/03/2015   Palpitations 04/03/2015   Neck pain 04/03/2015   Colitis 07/10/2014   Absolute anemia    Post-operative state 08/26/2013   Rectocele 08/26/2013   Anal or rectal pain 07/29/2013   Headache(784.0) 02/27/2012   ACUTE BRONCHITIS 03/25/2010   OBESITY 09/06/2009   INSOMNIA 01/23/2009   HYPOKALEMIA 11/01/2008   DYSPEPSIA&OTHER SPEC DISORDERS FUNCTION STOMACH 06/07/2008   ALLERGIC RHINITIS 03/23/2008   CONSTIPATION 01/20/2008    Shortness of breath 08/27/2007   TOBACCO ABUSE 03/09/2007   Migraine 03/09/2007   ABSCESS, TOOTH 03/09/2007   GERD (gastroesophageal reflux disease) 03/09/2007   Migraine variant 02/25/2007   ECZEMA 02/25/2007   Home Medication(s) Prior to Admission medications   Medication Sig Start Date End Date Taking? Authorizing Provider  ferrous sulfate 325 (65 FE) MG tablet Take 1 tablet (325 mg total) by mouth 2 (two) times daily with a meal. 09/23/20  Yes Benjiman Core, MD  fluticasone (FLONASE) 50 MCG/ACT nasal spray Place 2 sprays into both nostrils daily for 7 days. 07/30/21 08/29/21 Yes Long, [name], MD  neomycin-polymyxin-hydrocortisone (CORTISPORIN) 3.5-10000-1 OTIC suspension Place 4 drops into the right ear 3 (three) times daily for 7 days. 07/30/21 08/16/21 Yes Long, [name], MD  HYDROcodone-acetaminophen (NORCO/VICODIN) 5-325 MG tablet Take 1 tablet by mouth every 4 (four) hours as needed. 11/03/20   Mancel Bale, MD  oxyCODONE (ROXICODONE) 5 MG immediate release tablet Take 1 tablet (5 mg total) by mouth every 4 (four) hours as needed for severe pain. 11/09/20   Alvira Monday, MD  albuterol (PROVENTIL HFA;VENTOLIN HFA) 108 (90 Base) MCG/ACT inhaler Inhale 1-2 puffs into the lungs every 6 (six) hours as needed for wheezing or shortness of breath. Patient not taking: Reported on 03/24/2019 04/08/18 09/22/19  Long, [name], MD  Cetirizine HCl 10 MG CAPS Take 1 capsule (10 mg total) by mouth daily. 09/22/19 01/23/20  Wieters, Hallie C, PA-C  potassium chloride SA (K-DUR,KLOR-CON) 20 MEQ tablet Take 2 tablets (40 mEq  total) by mouth 2 (two) times daily. Patient not taking: Reported on 01/18/2018 12/10/17 09/22/19  Mesner, Corene Cornea, MD  sodium chloride (OCEAN) 0.65 % SOLN nasal spray Place 1 spray into both nostrils as needed for congestion. Patient not taking: Reported on 03/24/2019 12/14/18 09/22/19  Darlin Drop P, PA-C  SUMAtriptan (IMITREX) 6 MG/0.5ML SOLN injection Inject 0.5 mLs (6 mg total) into the  skin every 2 (two) hours as needed for migraine or headache. May repeat in 2 hours if headache persists or recurs.  Please provide 10-0.5 mg doses. 03/31/19 01/23/20  Veryl Speak, MD                                                                                                                                    Past Surgical History Past Surgical History:  Procedure Laterality Date   ESOPHAGOGASTRODUODENOSCOPY (EGD) WITH PROPOFOL N/A 04/04/2013   Procedure: ESOPHAGOGASTRODUODENOSCOPY (EGD) WITH PROPOFOL;  Surgeon: Arta Silence, MD;  Location: [facility];  Service: Endoscopy;  Laterality: N/A;   EXAMINATION UNDER ANESTHESIA N/A 08/10/2013   Procedure: RECTAL EXAM UNDER ANESTHESIA;  Surgeon: Joyice Faster. Cornett, MD;  Location: [facility];  Service: General;  Laterality: N/A;  ligation   HEMORRHOID SURGERY     MYRINGOTOMY     age 14   Family History Family History  Problem Relation Age of Onset   Heart disease Mother        Arrythmia   Kidney disease Mother    Hypertension Mother    COPD Mother    Diabetes Mother    Lung cancer Mother    Heart disease Father    Crohn's disease Maternal Aunt    Breast cancer Maternal Aunt        40's    Social History Social History   Tobacco Use   Smoking status: Some Days    Packs/day: 0.25    Years: 15.00    Pack years: 3.75    Types: Cigarettes   Smokeless tobacco: Never  Vaping Use   Vaping Use: Every day  Substance Use Topics   Alcohol use: Yes    Comment: social   Drug use: Never   Allergies Hydrocodone-acetaminophen and Penicillins  Review of Systems Review of Systems  Constitutional:  Positive for fatigue.   Physical Exam Vital Signs  I have reviewed the triage vital signs BP 128/90 (BP Location: Right Arm)    Pulse 98    Temp 97.6 F (36.4 C) (Oral)    Resp 18    Ht 5\' 4"  (1.626 m)    Wt 59 kg    LMP 07/19/2021    SpO2 100%    BMI 22.31 kg/m   Physical Exam Vitals and nursing note reviewed.   Constitutional:      General: She is not in acute distress.    Appearance: She is well-developed.  HENT:     Head: Normocephalic and atraumatic.  Eyes:     Conjunctiva/sclera: Conjunctivae normal.  Cardiovascular:     Rate and Rhythm: Normal rate and regular rhythm.     Heart sounds: No murmur heard. Pulmonary:     Effort: Pulmonary effort is normal. No respiratory distress.     Breath sounds: Normal breath sounds.  Abdominal:     Palpations: Abdomen is soft.     Tenderness: There is no abdominal tenderness.  Musculoskeletal:        General: No swelling.     Cervical back: Neck supple.  Skin:    General: Skin is warm and dry.     Capillary Refill: Capillary refill takes less than 2 seconds.  Neurological:     Mental Status: She is alert.  Psychiatric:        Mood and Affect: Mood normal.    ED Results and Treatments Labs (all labs ordered are listed, but only abnormal results are displayed) Labs Reviewed  COMPREHENSIVE METABOLIC PANEL - Abnormal; Notable for the following components:      Result Value   Potassium 3.2 (*)    All other components within normal limits  CBC WITH DIFFERENTIAL/PLATELET - Abnormal; Notable for the following components:   RBC 3.64 (*)    Hemoglobin 8.1 (*)    HCT 25.4 (*)    MCV 69.8 (*)    MCH 22.3 (*)    RDW 21.4 (*)    Platelets 533 (*)    All other components within normal limits  RESP PANEL BY RT-PCR (FLU A&B, COVID) ARPGX2  PREGNANCY, URINE  IRON AND TIBC  FERRITIN                                                                                                                          Radiology No results found.  Pertinent labs & imaging results that were available during my care of the patient were reviewed by me and considered in my medical decision making (see MDM for details).  Medications Ordered in ED Medications - No data to display                                                                                                                                    Procedures Procedures  (including critical care time)  Medical Decision Making / ED Course   This patient presents to the ED for concern of fatigue, this involves an  extensive number of treatment options, and is a complaint that carries with it a high risk of complications and morbidity.  The differential diagnosis includes iron deficiency anemia, acute blood loss anemia, COVID-19, electrolyte abnormality  MDM: Patient seen emergency department for evaluation of fatigue.  Physical exam is unremarkable.  Laboratory evaluation with a hemoglobin of 8.1 which is near the patient's baseline but her MCV has dropped to 69.8 likely suggestive of worsening iron deficiency anemia in the setting of her noncompliance with her iron supplementation.  Hypokalemia to 3.2 and she was instructed to increase her diet with potassium rich foods.  COVID and flu negative.  Pregnancy negative.  As patient is not having any dark stools, I offered a rectal exam but the patient declined and this is reasonable as she has an alternative explanation for her current anemia.  However, cannot rule out persistent micro GI bleeding from her peptic ulcer disease and she will need to follow-up outpatient with GI.  She does not have a primary care physician she was instructed to start there in an amatory referral was sent to hematology for anemia follow-up.  Patient started to continue with her iron supplementation and she was discharged with PCP follow-up.   Additional history obtained:  -External records from outside source obtained and reviewed including: Chart review including previous notes, labs, imaging, consultation notes   Lab Tests: -I ordered, reviewed, and interpreted labs.   The pertinent results include:   Labs Reviewed  COMPREHENSIVE METABOLIC PANEL - Abnormal; Notable for the following components:      Result Value   Potassium 3.2 (*)    All other components within  normal limits  CBC WITH DIFFERENTIAL/PLATELET - Abnormal; Notable for the following components:   RBC 3.64 (*)    Hemoglobin 8.1 (*)    HCT 25.4 (*)    MCV 69.8 (*)    MCH 22.3 (*)    RDW 21.4 (*)    Platelets 533 (*)    All other components within normal limits  RESP PANEL BY RT-PCR (FLU A&B, COVID) ARPGX2  PREGNANCY, URINE  IRON AND TIBC  FERRITIN   \  Medicines ordered and prescription drug management: No orders of the defined types were placed in this encounter.   -I have reviewed the patients home medicines and have made adjustments as needed  Critical interventions none   Cardiac Monitoring: The patient was maintained on a cardiac monitor.  I personally viewed and interpreted the cardiac monitored which showed an underlying rhythm of: NSR  Social Determinants of Health:  Factors impacting patients care include: no PCP   Reevaluation: After the interventions noted above, I reevaluated the patient and found that they have :stayed the same  Co morbidities that complicate the patient evaluation  Past Medical History:  Diagnosis Date   Anemia    Asthma    Blood transfusion without reported diagnosis    Chronic pain    GERD (gastroesophageal reflux disease)    Migraine    Peptic ulcer    Sickle cell trait (HCC)    Ulcer       Dispostion: I considered admission for this patient, but with negative laboratory evaluation and stable vital signs she does not meet inpatient criteria and she is safe for discharge at this time.     Final Clinical Impression(s) / ED Diagnoses Final diagnoses:  None     @PCDICTATION @    Brinton Brandel, Debe Coder, MD 08/10/21 1102

## 2021-08-13 LAB — PATHOLOGIST SMEAR REVIEW

## 2021-08-31 ENCOUNTER — Encounter (HOSPITAL_BASED_OUTPATIENT_CLINIC_OR_DEPARTMENT_OTHER): Payer: Self-pay | Admitting: *Deleted

## 2021-08-31 ENCOUNTER — Other Ambulatory Visit: Payer: Self-pay

## 2021-08-31 ENCOUNTER — Emergency Department (HOSPITAL_BASED_OUTPATIENT_CLINIC_OR_DEPARTMENT_OTHER)
Admission: EM | Admit: 2021-08-31 | Discharge: 2021-08-31 | Disposition: A | Discharge status: Home / Self Care | Payer: Self-pay | Attending: Student | Admitting: Student

## 2021-08-31 DIAGNOSIS — G43019 Migraine without aura, intractable, without status migrainosus: Secondary | ICD-10-CM | POA: Insufficient documentation

## 2021-08-31 MED ORDER — SUMATRIPTAN SUCCINATE 100 MG PO TABS: 100.0000 mg | ORAL_TABLET | ORAL | 0 refills | Status: DC | PRN

## 2021-08-31 MED ORDER — SUMATRIPTAN SUCCINATE 6 MG/0.5ML ~~LOC~~ SOLN
6.0000 mg | Freq: Once | SUBCUTANEOUS | Status: AC
  Administered 2021-08-31: 6 mg via SUBCUTANEOUS
  Filled 2021-08-31: qty 0.5

## 2021-08-31 NOTE — ED Provider Notes (Signed)
?[place] EMERGENCY DEPARTMENT ?Provider Note ? ? ?CSN: [id number] ?Arrival date & time: 08/31/21  2126 ? ?  ? ?History ? ?Chief Complaint  ?Patient presents with  ? Migraine  ? ? ?DWALA HAMRE is a 51 y.o. female who presents the emergency department with a migraine headache.  Patient states that her migraine started earlier today, and feels like her typical migraine on the left side of her head.  She states that she normally takes Imitrex, but ran out of her prescription, so she tried taking a dose of Excedrin without relief. She endorses nausea and light sensitivity.  She was feeling well leading up until today.  No fevers or chills. ? ? ?Migraine ?Associated symptoms include headaches.  ? ?  ? ?Home Medications ?Prior to Admission medications   ?Medication Sig Start Date End Date Taking? Authorizing Provider  ?SUMAtriptan (IMITREX) 100 MG tablet Take 1 tablet (100 mg total) by mouth every 2 (two) hours as needed for migraine. May repeat in 2 hours if headache persists or recurs. 08/31/21  Yes Kyran Connaughton T, PA-C  ?ferrous sulfate 325 (65 FE) MG tablet Take 1 tablet (325 mg total) by mouth 2 (two) times daily with a meal. 09/23/20   Davonna Belling, MD  ?fluticasone (FLONASE) 50 MCG/ACT nasal spray Place 2 sprays into both nostrils daily for 7 days. 07/30/21 08/29/21  Long, Wonda Olds, MD  ?HYDROcodone-acetaminophen (NORCO/VICODIN) 5-325 MG tablet Take 1 tablet by mouth every 4 (four) hours as needed. 11/03/20   Daleen Bo, MD  ?oxyCODONE (ROXICODONE) 5 MG immediate release tablet Take 1 tablet (5 mg total) by mouth every 4 (four) hours as needed for severe pain. 11/09/20   Gareth Morgan, MD  ?albuterol (PROVENTIL HFA;VENTOLIN HFA) 108 (90 Base) MCG/ACT inhaler Inhale 1-2 puffs into the lungs every 6 (six) hours as needed for wheezing or shortness of breath. ?Patient not taking: Reported on 03/24/2019 04/08/18 09/22/19  Margette Fast, MD  ?Cetirizine HCl 10 MG CAPS Take 1 capsule (10 mg total)  by mouth daily. 09/22/19 01/23/20  Wieters, Hallie C, PA-C  ?potassium chloride SA (K-DUR,KLOR-CON) 20 MEQ tablet Take 2 tablets (40 mEq total) by mouth 2 (two) times daily. ?Patient not taking: Reported on 01/18/2018 12/10/17 09/22/19  Mesner, Corene Cornea, MD  ?sodium chloride (OCEAN) 0.65 % SOLN nasal spray Place 1 spray into both nostrils as needed for congestion. ?Patient not taking: Reported on 03/24/2019 12/14/18 09/22/19  Arville Lime, PA-C  ?   ? ?Allergies    ?Hydrocodone-acetaminophen and Penicillins   ? ?Review of Systems   ?Review of Systems  ?Constitutional:  Negative for chills and fever.  ?Eyes:  Positive for photophobia.  ?Gastrointestinal:  Positive for nausea. Negative for vomiting.  ?Neurological:  Positive for headaches. Negative for syncope, weakness and light-headedness.  ?All other systems reviewed and are negative. ? ?Physical Exam ?Updated Vital Signs ?BP 106/69 (BP Location: Left Arm)   Pulse 93   Temp 98.1 ?F (36.7 ?C) (Oral)   Resp 18   Ht 5\' 4"  (1.626 m)   Wt 63.5 kg   LMP 08/17/2021   SpO2 98%   BMI 24.03 kg/m?  ?Physical Exam ?Vitals and nursing note reviewed.  ?Constitutional:   ?   Appearance: Normal appearance.  ?   Comments: Patient laying under a blanket in the dark  ?HENT:  ?   Head: Normocephalic and atraumatic.  ?Eyes:  ?   Conjunctiva/sclera: Conjunctivae normal.  ?Pulmonary:  ?   Effort: Pulmonary effort  is normal. No respiratory distress.  ?Skin: ?   General: Skin is warm and dry.  ?Neurological:  ?   Mental Status: She is alert.  ?   Comments: Neuro: Speech is clear, able to follow commands. CN III-XII intact grossly intact. PERRLA. EOMI. Sensation intact throughout. Str 5/5 all extremities.  ?Psychiatric:     ?   Mood and Affect: Mood normal.     ?   Behavior: Behavior normal.  ? ? ?ED Results / Procedures / Treatments   ?Labs ?(all labs ordered are listed, but only abnormal results are displayed) ?Labs Reviewed - No data to display ? ?EKG ?None ? ?Radiology ?No results  found. ? ?Procedures ?Procedures  ? ? ?Medications Ordered in ED ?Medications  ?SUMAtriptan (IMITREX) injection 6 mg (6 mg Subcutaneous Given 08/31/21 2245)  ? ? ?ED Course/ Medical Decision Making/ A&P ?  ?                        ?Medical Decision Making ?Risk ?Prescription drug management. ? ? ?This patient is a 51 year old female who presents to the ED for concern of migraine.  ? ?Past Medical History / Co-morbidities / Social History: ?Migraine, GERD, sickle cell trait ? ?Physical Exam: ?Physical exam performed. The pertinent findings include: Patient is afebrile, not tachycardic, no acute distress.  She states that her headache feels like her typical with photophobia and nausea. Normal neurologic exam as above.  ? ?Medications: ?Patient given 1 dose of Imitrex.  On my reevaluation patient states that her symptoms have resolved, and she is ready to go home. ?  ?Disposition: ?After consideration of the diagnostic results and the patients response to treatment, I feel that patient is not requiring admission or inpatient treatment for her symptoms.  We will refill patient's prescription of Imitrex.  Discussed reasons to return to the emergency department, and the patient is agreeable to the plan.  ? ?Final Clinical Impression(s) / ED Diagnoses ?Final diagnoses:  ?Intractable migraine without aura and without status migrainosus  ? ? ?Rx / DC Orders ?ED Discharge Orders   ? ?      Ordered  ?  SUMAtriptan (IMITREX) 100 MG tablet  Every 2 hours PRN       ? 08/31/21 2326  ? ?  ?  ? ?  ? ?Portions of this report may have been transcribed using voice recognition software. Every effort was made to ensure accuracy; however, inadvertent computerized transcription errors may be present. ? ?  ?Lilyrose Tanney T, PA-C ?09/01/21 0003 ? ?  ?Teressa Lower, MD ?09/02/21 0007 ? ?

## 2021-08-31 NOTE — Discharge Instructions (Addendum)
You were seen in the emergency department today for migraine. ? ?I am glad that we have had resolution of your symptoms with Imitrex. I have sent a refill of this prescription to your pharmacy. ? ?Continue to monitor how you're doing and return to the ER for new or worsening symptoms.  ?

## 2021-08-31 NOTE — ED Triage Notes (Signed)
Pt reports mha today- took excedrin earlier- endorses nausea and light sensitivity ?

## 2021-08-31 NOTE — ED Notes (Signed)
Patient discharged to home.  All discharge instructions reviewed.  Patient verbalized understanding via teachback method.  VS WDL.  Respirations even and unlabored.  Ambulatory out of ED.   °

## 2021-09-01 ENCOUNTER — Telehealth (HOSPITAL_BASED_OUTPATIENT_CLINIC_OR_DEPARTMENT_OTHER): Payer: Self-pay | Admitting: Emergency Medicine

## 2021-09-06 ENCOUNTER — Emergency Department (HOSPITAL_COMMUNITY)
Admission: EM | Admit: 2021-09-06 | Discharge: 2021-09-06 | Disposition: A | Discharge status: Home / Self Care | Payer: Self-pay | Attending: Emergency Medicine | Admitting: Emergency Medicine

## 2021-09-06 ENCOUNTER — Encounter (HOSPITAL_COMMUNITY): Payer: Self-pay

## 2021-09-06 ENCOUNTER — Other Ambulatory Visit: Payer: Self-pay

## 2021-09-06 DIAGNOSIS — J45909 Unspecified asthma, uncomplicated: Secondary | ICD-10-CM | POA: Insufficient documentation

## 2021-09-06 DIAGNOSIS — D649 Anemia, unspecified: Secondary | ICD-10-CM | POA: Insufficient documentation

## 2021-09-06 DIAGNOSIS — G43909 Migraine, unspecified, not intractable, without status migrainosus: Secondary | ICD-10-CM | POA: Insufficient documentation

## 2021-09-06 LAB — COMPREHENSIVE METABOLIC PANEL
ALT: 14 U/L (ref 0–44)
AST: 20 U/L (ref 15–41)
Albumin: 3.5 g/dL (ref 3.5–5.0)
Alkaline Phosphatase: 50 U/L (ref 38–126)
Anion gap: 6 (ref 5–15)
BUN: 8 mg/dL (ref 6–20)
CO2: 24 mmol/L (ref 22–32)
Calcium: 8.7 mg/dL — ABNORMAL LOW (ref 8.9–10.3)
Chloride: 105 mmol/L (ref 98–111)
Creatinine, Ser: 0.56 mg/dL (ref 0.44–1.00)
GFR, Estimated: >60 mL/min (ref >60)
Glucose, Bld: 105 mg/dL — ABNORMAL HIGH (ref 70–99)
Potassium: 3.1 mmol/L — ABNORMAL LOW (ref 3.5–5.1)
Sodium: 135 mmol/L (ref 135–145)
Total Bilirubin: 0.3 mg/dL (ref 0.3–1.2)
Total Protein: 6.4 g/dL — ABNORMAL LOW (ref 6.5–8.1)

## 2021-09-06 LAB — CBC
HCT: 27.6 % — ABNORMAL LOW (ref 36.0–46.0)
Hemoglobin: 8.7 g/dL — ABNORMAL LOW (ref 12.0–15.0)
MCH: 22.8 pg — ABNORMAL LOW (ref 26.0–34.0)
MCHC: 31.5 g/dL (ref 30.0–36.0)
MCV: 72.4 fL — ABNORMAL LOW (ref 80.0–100.0)
Platelets: 548 10*3/uL — ABNORMAL HIGH (ref 150–400)
RBC: 3.81 MIL/uL — ABNORMAL LOW (ref 3.87–5.11)
RDW: 23 % — ABNORMAL HIGH (ref 11.5–15.5)
WBC: 3.8 10*3/uL — ABNORMAL LOW (ref 4.0–10.5)
nRBC: 0 % (ref 0.0–0.2)

## 2021-09-06 MED ORDER — SUMATRIPTAN SUCCINATE 6 MG/0.5ML ~~LOC~~ SOLN
6.0000 mg | Freq: Once | SUBCUTANEOUS | Status: AC
  Administered 2021-09-06: 6 mg via SUBCUTANEOUS
  Filled 2021-09-06: qty 0.5

## 2021-09-06 MED ORDER — SUMATRIPTAN SUCCINATE 6 MG/0.5ML ~~LOC~~ SOLN: 6.0000 mg | SUBCUTANEOUS | 0 refills | Status: DC | PRN

## 2021-09-06 NOTE — ED Provider Notes (Signed)
?[facility]-EMERGENCY DEPT ?Provider Note ? ? ?CSN: [id number] ?Arrival date & time: 09/06/21  7829 ? ?  ? ?History ? ?Chief Complaint  ?Patient presents with  ? Migraine  ? Nausea  ? Jaundice  ? ? ?Jenny Evans is a 51 y.o. female. ? ? ?Migraine ?Associated symptoms include headaches. Patient has a history of asthma, chronic pain, sickle cell trait, migraines who presents with a migraine headache.  Patient she started having a migraine today at about 2 AM.  She has had some nausea as well with it.  Her headache is typical for her migraines.  She is not having any fevers or chills.  No numbness or weakness.  No trouble with her vision or her speech.  Patient states normally she takes Imitrex injections.  She does not have a primary care doctor right now and the last time she was seen in an emergency room they gave her a prescription for the pills which are not as effective. ? ?Patient also mentions possible yellow discoloration of her eyes.  She states family members have noticed it recently.  She states her mother has a history of liver issues.  Patient does not have any abdominal pain.  No other complaints ? ?  ? ?Home Medications ?Prior to Admission medications   ?Medication Sig Start Date End Date Taking? Authorizing Provider  ?SUMAtriptan (IMITREX) 6 MG/0.5ML SOLN injection Inject 0.5 mLs (6 mg total) into the skin every 2 (two) hours as needed for migraine or headache. May repeat in 2 hours if headache persists or recurs. (Max two doses per day) 09/06/21  Yes Linwood Dibbles, MD  ?ferrous sulfate 325 (65 FE) MG tablet Take 1 tablet (325 mg total) by mouth 2 (two) times daily with a meal. 09/23/20   Benjiman Core, MD  ?fluticasone (FLONASE) 50 MCG/ACT nasal spray Place 2 sprays into both nostrils daily for 7 days. 07/30/21 08/29/21  Long, [name], MD  ?HYDROcodone-acetaminophen (NORCO/VICODIN) 5-325 MG tablet Take 1 tablet by mouth every 4 (four) hours as needed. 11/03/20   Mancel Bale, MD   ?oxyCODONE (ROXICODONE) 5 MG immediate release tablet Take 1 tablet (5 mg total) by mouth every 4 (four) hours as needed for severe pain. 11/09/20   Alvira Monday, MD  ?albuterol (PROVENTIL HFA;VENTOLIN HFA) 108 (90 Base) MCG/ACT inhaler Inhale 1-2 puffs into the lungs every 6 (six) hours as needed for wheezing or shortness of breath. ?Patient not taking: Reported on 03/24/2019 04/08/18 09/22/19  Maia Plan, MD  ?Cetirizine HCl 10 MG CAPS Take 1 capsule (10 mg total) by mouth daily. 09/22/19 01/23/20  Wieters, Hallie C, PA-C  ?potassium chloride SA (K-DUR,KLOR-CON) 20 MEQ tablet Take 2 tablets (40 mEq total) by mouth 2 (two) times daily. ?Patient not taking: Reported on 01/18/2018 12/10/17 09/22/19  Mesner, Barbara Cower, MD  ?sodium chloride (OCEAN) 0.65 % SOLN nasal spray Place 1 spray into both nostrils as needed for congestion. ?Patient not taking: Reported on 03/24/2019 12/14/18 09/22/19  Leretha Dykes, PA-C  ?   ? ?Allergies    ?Hydrocodone-acetaminophen and Penicillins   ? ?Review of Systems   ?Review of Systems  ?Constitutional:  Negative for fever.  ?Neurological:  Positive for headaches.  ? ?Physical Exam ?Updated Vital Signs ?BP 105/71   Pulse 81   Temp 97.6 ?F (36.4 ?C) (Oral)   Resp 20   Ht 1.626 m (5\' 4" )   Wt 63.5 kg   LMP 08/17/2021   SpO2 100%   BMI 24.03  kg/m?  ?Physical Exam ?Vitals and nursing note reviewed.  ?Constitutional:   ?   General: She is not in acute distress. ?   Appearance: She is well-developed.  ?HENT:  ?   Head: Normocephalic and atraumatic.  ?   Right Ear: External ear normal.  ?   Left Ear: External ear normal.  ?Eyes:  ?   General:     ?   Right eye: No discharge.     ?   Left eye: No discharge.  ?   Conjunctiva/sclera: Conjunctivae normal.  ?   Comments: ?icterus  ?Neck:  ?   Trachea: No tracheal deviation.  ?Cardiovascular:  ?   Rate and Rhythm: Normal rate and regular rhythm.  ?Pulmonary:  ?   Effort: Pulmonary effort is normal. No respiratory distress.  ?   Breath sounds: Normal  breath sounds. No stridor. No wheezing or rales.  ?Abdominal:  ?   General: Bowel sounds are normal. There is no distension.  ?   Palpations: Abdomen is soft.  ?   Tenderness: There is no abdominal tenderness. There is no guarding or rebound.  ?Musculoskeletal:     ?   General: No tenderness or deformity.  ?   Cervical back: Neck supple.  ?Skin: ?   General: Skin is warm and dry.  ?   Findings: No rash.  ?Neurological:  ?   General: No focal deficit present.  ?   Mental Status: She is alert and oriented to person, place, and time.  ?   Cranial Nerves: No cranial nerve deficit (no facial droop, extraocular movements intact, no slurred speech).  ?   Sensory: No sensory deficit.  ?   Motor: No weakness, abnormal muscle tone or seizure activity.  ?   Coordination: Coordination normal.  ?   Comments: Normal strength and sensation bilateral upper extremity and lower extremities  ?Psychiatric:     ?   Mood and Affect: Mood normal.  ? ? ?ED Results / Procedures / Treatments   ?Labs ?(all labs ordered are listed, but only abnormal results are displayed) ?Labs Reviewed  ?CBC - Abnormal; Notable for the following components:  ?    Result Value  ? WBC 3.8 (*)   ? RBC 3.81 (*)   ? Hemoglobin 8.7 (*)   ? HCT 27.6 (*)   ? MCV 72.4 (*)   ? MCH 22.8 (*)   ? RDW 23.0 (*)   ? Platelets 548 (*)   ? All other components within normal limits  ?COMPREHENSIVE METABOLIC PANEL - Abnormal; Notable for the following components:  ? Potassium 3.1 (*)   ? Glucose, Bld 105 (*)   ? Calcium 8.7 (*)   ? Total Protein 6.4 (*)   ? All other components within normal limits  ? ? ?EKG ?None ? ?Radiology ?No results found. ? ?Procedures ?Procedures  ? ? ?Medications Ordered in ED ?Medications  ?SUMAtriptan (IMITREX) injection 6 mg (6 mg Subcutaneous Given 09/06/21 0933)  ? ? ?ED Course/ Medical Decision Making/ A&P ?Clinical Course as of 09/06/21 1038  ?Fri Sep 06, 2021  ?1027 CBC(!) ?Anemia noted, similar to previous values [JK]  ?1028 Elevated platelets  noted, similar to previous [JK]  ?1028 Comprehensive metabolic panel(!) ?No signs of hyperbilirubinemia, metabolic panel unremarkable [JK]  ?  ?Clinical Course User Index ?[JK] Linwood DibblesKnapp, Josejulian Tarango, MD  ? ?                        ?  Medical Decision Making ?Amount and/or Complexity of Data Reviewed ?Labs: ordered. Decision-making details documented in ED Course. ? ?Risk ?Prescription drug management. ? ? ?Patient presented to the ED for evaluation of migraine headache as well as yellow discoloration to her eyes.  Patient was treated with a dose of Imitrex.  Headache has improved.  Presentation consistent with her chronic migraines.  No findings to suggest acute infection.  No acute neurologic dysfunction to suggest stroke.  Doubt subarachnoid hemorrhage or other acute vascular event ? ?Patient complaining some yellowish discoloration to her eyes.  Laboratory test do not show any signs of hyperbilirubinemia.  She does not have jaundice.  Patient does have chronic anemia.  This is unchanged.  No signs to suggest acute hemolysis.  Recommend outpatient follow-up with ophthalmologist and PCP ? ? ? ? ? ? ? ?Final Clinical Impression(s) / ED Diagnoses ?Final diagnoses:  ?Anemia, unspecified type  ?Migraine without status migrainosus, not intractable, unspecified migraine type  ? ? ?Rx / DC Orders ?ED Discharge Orders   ? ?      Ordered  ?  SUMAtriptan (IMITREX) 6 MG/0.5ML SOLN injection  Every 2 hours PRN       ? 09/06/21 1037  ? ?  ?  ? ?  ? ? ?  ?Linwood Dibbles, MD ?09/06/21 1038 ? ?

## 2021-09-06 NOTE — Discharge Instructions (Signed)
Follow-up with a primary care doctor to check on your anemia.  Blood test did not show any signs of problems with your liver or jaundice.  Take the medications as needed for your migraines ?

## 2021-09-06 NOTE — ED Triage Notes (Addendum)
Patient c/o a migraine and nausea since 0200 today. ? ?Patient c/o "yellow eyes" x 2 weeks. ?

## 2021-09-08 ENCOUNTER — Other Ambulatory Visit: Payer: Self-pay

## 2021-09-08 ENCOUNTER — Encounter (HOSPITAL_BASED_OUTPATIENT_CLINIC_OR_DEPARTMENT_OTHER): Payer: Self-pay | Admitting: Emergency Medicine

## 2021-09-08 ENCOUNTER — Emergency Department (HOSPITAL_BASED_OUTPATIENT_CLINIC_OR_DEPARTMENT_OTHER)
Admission: EM | Admit: 2021-09-08 | Discharge: 2021-09-08 | Disposition: A | Discharge status: Home / Self Care | Payer: Self-pay | Attending: Emergency Medicine | Admitting: Emergency Medicine

## 2021-09-08 DIAGNOSIS — R519 Headache, unspecified: Secondary | ICD-10-CM | POA: Insufficient documentation

## 2021-09-08 MED ORDER — CYCLOBENZAPRINE HCL 10 MG PO TABS: 10.0000 mg | ORAL_TABLET | Freq: Three times a day (TID) | ORAL | 0 refills | Status: DC | PRN

## 2021-09-08 MED ORDER — LACTATED RINGERS IV BOLUS
1000.0000 mL | Freq: Once | INTRAVENOUS | Status: AC
  Administered 2021-09-08: 1000 mL via INTRAVENOUS

## 2021-09-08 MED ORDER — DIPHENHYDRAMINE HCL 50 MG/ML IJ SOLN
25.0000 mg | Freq: Once | INTRAMUSCULAR | Status: AC
  Administered 2021-09-08: 25 mg via INTRAVENOUS
  Filled 2021-09-08: qty 1

## 2021-09-08 MED ORDER — PROCHLORPERAZINE EDISYLATE 10 MG/2ML IJ SOLN
10.0000 mg | Freq: Once | INTRAMUSCULAR | Status: AC
  Administered 2021-09-08: 10 mg via INTRAVENOUS
  Filled 2021-09-08: qty 2

## 2021-09-08 MED ORDER — DEXAMETHASONE SODIUM PHOSPHATE 10 MG/ML IJ SOLN
10.0000 mg | Freq: Once | INTRAMUSCULAR | Status: AC
  Administered 2021-09-08: 10 mg via INTRAVENOUS
  Filled 2021-09-08: qty 1

## 2021-09-08 NOTE — ED Triage Notes (Signed)
Reports migraine headache since 10pm last night.  Using imitrex injection at home with no relief. ?

## 2021-09-08 NOTE — ED Provider Notes (Signed)
?[facility] EMERGENCY DEPARTMENT ?Provider Note ? ? ?CSN: [id number] ?Arrival date & time: 09/08/21  8786 ? ?  ? ?History ? ?Chief Complaint  ?Patient presents with  ? Headache  ? ? ?Jenny Evans is a 51 y.o. female. ? ?The history is provided by the patient.  ?Headache ?She has history of sickle cell trait, migraine headaches and comes in with a left-sided headache which she feels is typical of her migraines.  She has actually been having problems for about a week with a headache waxing and waning.  She has been seen twice in the emergency department, treated with sumatriptan injections both times and getting temporary relief both times.  She does endorse photophobia and phonophobia but denies visual change.  There has been nausea but no vomiting.  She denies fever or chills.  She has taken Excedrin Migraine without any benefit. ?  ?Home Medications ?Prior to Admission medications   ?Medication Sig Start Date End Date Taking? Authorizing Provider  ?ferrous sulfate 325 (65 FE) MG tablet Take 1 tablet (325 mg total) by mouth 2 (two) times daily with a meal. 09/23/20   Benjiman Core, MD  ?fluticasone (FLONASE) 50 MCG/ACT nasal spray Place 2 sprays into both nostrils daily for 7 days. 07/30/21 08/29/21  Long, [name], MD  ?HYDROcodone-acetaminophen (NORCO/VICODIN) 5-325 MG tablet Take 1 tablet by mouth every 4 (four) hours as needed. 11/03/20   Mancel Bale, MD  ?oxyCODONE (ROXICODONE) 5 MG immediate release tablet Take 1 tablet (5 mg total) by mouth every 4 (four) hours as needed for severe pain. 11/09/20   Alvira Monday, MD  ?SUMAtriptan (IMITREX) 6 MG/0.5ML SOLN injection Inject 0.5 mLs (6 mg total) into the skin every 2 (two) hours as needed for migraine or headache. May repeat in 2 hours if headache persists or recurs. (Max two doses per day) 09/06/21   Linwood Dibbles, MD  ?albuterol (PROVENTIL HFA;VENTOLIN HFA) 108 (90 Base) MCG/ACT inhaler Inhale 1-2 puffs into the lungs every 6 (six) hours as  needed for wheezing or shortness of breath. ?Patient not taking: Reported on 03/24/2019 04/08/18 09/22/19  Maia Plan, MD  ?Cetirizine HCl 10 MG CAPS Take 1 capsule (10 mg total) by mouth daily. 09/22/19 01/23/20  Wieters, Hallie C, PA-C  ?potassium chloride SA (K-DUR,KLOR-CON) 20 MEQ tablet Take 2 tablets (40 mEq total) by mouth 2 (two) times daily. ?Patient not taking: Reported on 01/18/2018 12/10/17 09/22/19  Mesner, Barbara Cower, MD  ?sodium chloride (OCEAN) 0.65 % SOLN nasal spray Place 1 spray into both nostrils as needed for congestion. ?Patient not taking: Reported on 03/24/2019 12/14/18 09/22/19  Leretha Dykes, PA-C  ?   ? ?Allergies    ?Hydrocodone-acetaminophen and Penicillins   ? ?Review of Systems   ?Review of Systems  ?Neurological:  Positive for headaches.  ?All other systems reviewed and are negative. ? ?Physical Exam ?Updated Vital Signs ?BP (!) 124/91 (BP Location: Right Arm)   Pulse 88   Temp 97.7 ?F (36.5 ?C) (Oral)   Resp 18   Ht 5\' 4"  (1.626 m)   Wt 64.4 kg   LMP 08/17/2021   SpO2 100%   BMI 24.37 kg/m?  ?Physical Exam ?Vitals and nursing note reviewed.  ?51 year old female, resting comfortably and in no acute distress. Vital signs are significant for borderline elevated blood pressure. Oxygen saturation is 100%, which is normal. ?Head is normocephalic and atraumatic. PERRLA, EOMI. Oropharynx is clear.  There is tenderness palpation over the temporalis muscle on the  left side, and also at the insertion of the left paracervical muscles. ?Neck is nontender and supple without adenopathy or JVD. ?Back is nontender and there is no CVA tenderness. ?Lungs are clear without rales, wheezes, or rhonchi. ?Chest is nontender. ?Heart has regular rate and rhythm without murmur. ?Abdomen is soft, flat, nontender. ?Extremities have no cyanosis or edema, full range of motion is present. ?Skin is warm and dry without rash. ?Neurologic: Mental status is normal, cranial nerves are intact, strength is 5/5 in all 4  extremities. ? ?ED Results / Procedures / Treatments   ?Labs ?(all labs ordered are listed, but only abnormal results are displayed) ?Labs Reviewed - No data to display ? ?EKG ?None ? ?Radiology ?No results found. ? ?Procedures ?Procedures  ? ? ?Medications Ordered in ED ?Medications  ?lactated ringers bolus 1,000 mL (has no administration in time range)  ?prochlorperazine (COMPAZINE) injection 10 mg (has no administration in time range)  ?diphenhydrAMINE (BENADRYL) injection 25 mg (has no administration in time range)  ?dexamethasone (DECADRON) injection 10 mg (has no administration in time range)  ? ? ?ED Course/ Medical Decision Making/ A&P ?  ?                        ?Medical Decision Making ?Risk ?Prescription drug management. ? ? ?Headache which may be a migraine variant, possibly muscle contraction headache.  Old records are reviewed confirming to recent ED visits with headaches treated with sumatriptan, as well as other ED visits for headaches.  No red flags to suggest serious pathology such as meningitis, subarachnoid hemorrhage.  She has had a CT scan of her head in 2020 which was normal.  Since patient has already received 2 sumatriptan doses this week, we will try a migraine cocktail.  She is given a bolus of lactated Ringer's solution plus intravenous prochlorperazine, diphenhydramine, dexamethasone. ? ?She had excellent relief of her headache with above-noted treatment.  I do suspect that muscle spasm is playing a part in her symptoms, so she is discharged with prescription for cyclobenzaprine.  Advised to try using ice packs to supplement over-the-counter analgesics if headache recurs. ? ?Final Clinical Impression(s) / ED Diagnoses ?Final diagnoses:  ?Bad headache  ? ? ?Rx / DC Orders ?ED Discharge Orders   ? ?      Ordered  ?  cyclobenzaprine (FLEXERIL) 10 MG tablet  3 times daily PRN       ? 09/08/21 0702  ? ?  ?  ? ?  ? ? ?  ?Dione Booze, MD ?09/08/21 [phone] ? ?

## 2021-09-08 NOTE — Discharge Instructions (Addendum)
If your headache comes back, try putting ice packs on the sore areas in addition to taking migraine medications. ?

## 2021-09-09 ENCOUNTER — Emergency Department (HOSPITAL_BASED_OUTPATIENT_CLINIC_OR_DEPARTMENT_OTHER)
Admission: EM | Admit: 2021-09-09 | Discharge: 2021-09-10 | Disposition: A | Discharge status: Home / Self Care | Payer: Self-pay | Attending: Emergency Medicine | Admitting: Emergency Medicine

## 2021-09-09 ENCOUNTER — Encounter (HOSPITAL_BASED_OUTPATIENT_CLINIC_OR_DEPARTMENT_OTHER): Payer: Self-pay

## 2021-09-09 ENCOUNTER — Other Ambulatory Visit: Payer: Self-pay

## 2021-09-09 DIAGNOSIS — D649 Anemia, unspecified: Secondary | ICD-10-CM | POA: Insufficient documentation

## 2021-09-09 DIAGNOSIS — D75839 Thrombocytosis, unspecified: Secondary | ICD-10-CM | POA: Insufficient documentation

## 2021-09-09 DIAGNOSIS — E079 Disorder of thyroid, unspecified: Secondary | ICD-10-CM | POA: Insufficient documentation

## 2021-09-09 DIAGNOSIS — J45909 Unspecified asthma, uncomplicated: Secondary | ICD-10-CM | POA: Insufficient documentation

## 2021-09-09 DIAGNOSIS — M436 Torticollis: Secondary | ICD-10-CM | POA: Insufficient documentation

## 2021-09-09 DIAGNOSIS — D509 Iron deficiency anemia, unspecified: Secondary | ICD-10-CM

## 2021-09-09 DIAGNOSIS — E876 Hypokalemia: Secondary | ICD-10-CM | POA: Insufficient documentation

## 2021-09-09 DIAGNOSIS — E010 Iodine-deficiency related diffuse (endemic) goiter: Secondary | ICD-10-CM

## 2021-09-09 MED ORDER — LACTATED RINGERS IV BOLUS
1000.0000 mL | Freq: Once | INTRAVENOUS | Status: AC
  Administered 2021-09-10: 1000 mL via INTRAVENOUS

## 2021-09-09 MED ORDER — METHOCARBAMOL 1000 MG/10ML IJ SOLN
1000.0000 mg | Freq: Once | INTRAVENOUS | Status: AC
  Administered 2021-09-10: 1000 mg via INTRAVENOUS
  Filled 2021-09-09: qty 10

## 2021-09-09 MED ORDER — PROCHLORPERAZINE EDISYLATE 10 MG/2ML IJ SOLN
10.0000 mg | Freq: Once | INTRAMUSCULAR | Status: AC
  Administered 2021-09-10: 10 mg via INTRAVENOUS
  Filled 2021-09-09: qty 2

## 2021-09-09 MED ORDER — KETOROLAC TROMETHAMINE 30 MG/ML IJ SOLN
30.0000 mg | Freq: Once | INTRAMUSCULAR | Status: AC
  Administered 2021-09-10: 30 mg via INTRAVENOUS
  Filled 2021-09-09: qty 1

## 2021-09-09 NOTE — ED Notes (Signed)
Patient states taking "muscle relaxer" with no relief for headache. ?

## 2021-09-09 NOTE — ED Triage Notes (Signed)
Pt c/o cont'd migraine and swelling to left side of neck-states she was seen for both yesterday-states she was given "muscle relaxer"-no relief-NAD-steady gait ?

## 2021-09-09 NOTE — ED Provider Notes (Signed)
?[facility] EMERGENCY DEPARTMENT ?Provider Note ? ? ?CSN: [id number] ?Arrival date & time: 09/09/21  2213 ? ?  ? ?History ? ?Chief Complaint  ?Patient presents with  ? Migraine  ? ? ?Jenny Evans is a 51 y.o. female. ? ?The history is provided by the patient.  ?Migraine ?She has history of migraines, asthma, sickle cell trait and comes in complaining of swelling in the left side of her neck and a left-sided headache which started today.  She had been in the emergency department several times over the last week with migraine headaches and had been prescribed a muscle relaxer.  She had been doing well since her last ED visit until this morning.  She denies photophobia or phonophobia.  There has been mild nausea but no vomiting.  She denies weakness, numbness, tingling.  She denies fever, chills, sweats. ?  ?Home Medications ?Prior to Admission medications   ?Medication Sig Start Date End Date Taking? Authorizing Provider  ?cyclobenzaprine (FLEXERIL) 10 MG tablet Take 1 tablet (10 mg total) by mouth 3 (three) times daily as needed for muscle spasms. 09/08/21   Dione BoozeGlick, Amiel Mccaffrey, MD  ?ferrous sulfate 325 (65 FE) MG tablet Take 1 tablet (325 mg total) by mouth 2 (two) times daily with a meal. 09/23/20   Benjiman CorePickering, Nathan, MD  ?fluticasone (FLONASE) 50 MCG/ACT nasal spray Place 2 sprays into both nostrils daily for 7 days. 07/30/21 08/29/21  Long, Arlyss RepressJoshua G, MD  ?HYDROcodone-acetaminophen (NORCO/VICODIN) 5-325 MG tablet Take 1 tablet by mouth every 4 (four) hours as needed. 11/03/20   Mancel BaleWentz, Elliott, MD  ?oxyCODONE (ROXICODONE) 5 MG immediate release tablet Take 1 tablet (5 mg total) by mouth every 4 (four) hours as needed for severe pain. 11/09/20   Alvira MondaySchlossman, Erin, MD  ?SUMAtriptan (IMITREX) 6 MG/0.5ML SOLN injection Inject 0.5 mLs (6 mg total) into the skin every 2 (two) hours as needed for migraine or headache. May repeat in 2 hours if headache persists or recurs. (Max two doses per day) 09/06/21   Linwood DibblesKnapp, Jon, MD   ?albuterol (PROVENTIL HFA;VENTOLIN HFA) 108 (90 Base) MCG/ACT inhaler Inhale 1-2 puffs into the lungs every 6 (six) hours as needed for wheezing or shortness of breath. ?Patient not taking: Reported on 03/24/2019 04/08/18 09/22/19  Maia PlanLong, Joshua G, MD  ?Cetirizine HCl 10 MG CAPS Take 1 capsule (10 mg total) by mouth daily. 09/22/19 01/23/20  Wieters, Hallie C, PA-C  ?potassium chloride SA (K-DUR,KLOR-CON) 20 MEQ tablet Take 2 tablets (40 mEq total) by mouth 2 (two) times daily. ?Patient not taking: Reported on 01/18/2018 12/10/17 09/22/19  Mesner, Barbara CowerJason, MD  ?sodium chloride (OCEAN) 0.65 % SOLN nasal spray Place 1 spray into both nostrils as needed for congestion. ?Patient not taking: Reported on 03/24/2019 12/14/18 09/22/19  Leretha DykesHernandez, Ana P, PA-C  ?   ? ?Allergies    ?Hydrocodone-acetaminophen   ? ?Review of Systems   ?Review of Systems  ?All other systems reviewed and are negative. ? ?Physical Exam ?Updated Vital Signs ?BP 122/86 (BP Location: Left Arm)   Pulse 93   Temp 97.7 ?F (36.5 ?C) (Oral)   Resp 18   Ht 5\' 4"  (1.626 m)   Wt 65.8 kg   LMP 08/17/2021   SpO2 100%   BMI 24.89 kg/m?  ?Physical Exam ?Vitals and nursing note reviewed.  ?51 year old female, resting comfortably and in no acute distress. Vital signs are normal. Oxygen saturation is 100%, which is normal. ?Head is normocephalic and atraumatic. PERRLA, EOMI. Oropharynx is  clear. ?Neck: There is spasm and tenderness of the superior aspect of the left sternocleidomastoid muscle.  There is full passive range of motion of the neck, but there is pain elicited with this.  There is no adenopathy or JVD. ?Back is nontender and there is no CVA tenderness. ?Lungs are clear without rales, wheezes, or rhonchi. ?Chest is nontender. ?Heart has regular rate and rhythm without murmur. ?Abdomen is soft, flat, nontender. ?Extremities have no cyanosis or edema, full range of motion is present. ?Skin is warm and dry without rash. ?Neurologic: Mental status is normal, cranial  nerves are intact, strength is 5/5 in all 4 extremities. ? ?ED Results / Procedures / Treatments   ?Labs ?(all labs ordered are listed, but only abnormal results are displayed) ?Labs Reviewed  ?BASIC METABOLIC PANEL - Abnormal; Notable for the following components:  ?    Result Value  ? Potassium 2.9 (*)   ? Glucose, Bld 101 (*)   ? Calcium 8.4 (*)   ? All other components within normal limits  ?CBC WITH DIFFERENTIAL/PLATELET - Abnormal; Notable for the following components:  ? RBC 3.37 (*)   ? Hemoglobin 7.7 (*)   ? HCT 24.0 (*)   ? MCV 71.2 (*)   ? MCH 22.8 (*)   ? RDW 22.8 (*)   ? Platelets 434 (*)   ? All other components within normal limits  ?HCG, SERUM, QUALITATIVE  ?MAGNESIUM  ? ? ?EKG ?None ? ?Radiology ?No results found. ? ?Procedures ?Procedures  ? ? ?Medications Ordered in ED ?Medications  ?methocarbamol (ROBAXIN) 1000 MG/10ML injection (  Canceled Entry 09/10/21 0025)  ?lactated ringers bolus 1,000 mL ( Intravenous Stopped 09/10/21 0232)  ?prochlorperazine (COMPAZINE) injection 10 mg (10 mg Intravenous Given 09/10/21 0024)  ?methocarbamol (ROBAXIN) 1,000 mg in dextrose 5 % 100 mL IVPB (0 mg Intravenous Stopped 09/10/21 0127)  ?ketorolac (TORADOL) 30 MG/ML injection 30 mg (30 mg Intravenous Given 09/10/21 0024)  ?potassium chloride 10 mEq in 100 mL IVPB (0 mEq Intravenous Stopped 09/10/21 0326)  ?magnesium sulfate IVPB 2 g 50 mL (0 g Intravenous Stopped 09/10/21 0326)  ?potassium chloride SA (KLOR-CON M) CR tablet 40 mEq (40 mEq Oral Given 09/10/21 0423)  ?morphine (PF) 4 MG/ML injection 4 mg (4 mg Intravenous Given 09/10/21 0424)  ?iohexol (OMNIPAQUE) 300 MG/ML solution 75 mL (75 mLs Intravenous Contrast Given 09/10/21 0409)  ? ? ?ED Course/ Medical Decision Making/ A&P ?  ?                        ?Medical Decision Making ?Amount and/or Complexity of Data Reviewed ?Labs: ordered. ?Radiology: ordered. ? ?Risk ?Prescription drug management. ? ? ?Left-sided neck pain and headache which appears to be spasm of the  sternocleidomastoid muscle.  No evidence of meningitis.  We will check screening labs to look for possible evidence of infection, give migraine cocktail of lactated Ringer's plus prochlorperazine, diphenhydramine, dexamethasone, ketorolac.  We will also give dose of methocarbamol.  She will need to be assessed for clinical response, may need CT of neck if she does not respond to these medications.  Old records are reviewed confirming 3 recent ED visits for headaches, but patient states that today's headache is different. ? ?Potassium is come back very low at 2.9, magnesium has come back borderline at 1.6.  She is given oral and intravenous potassium, intravenous magnesium.  Anemia and thrombocytosis are present not significantly changed from prior. ? ?She states that there was  no improvement with above-noted medication.  She was sent for CT of the soft tissues of the neck with contrast which shows no masses, no inflammatory changes.  Incidental finding of diffuse thyromegaly with recommendation for follow-up thyroid ultrasound.  I have independently viewed the images, and agree with radiologist's interpretation.  She is discharged with prescriptions for K-Dur as well as hydrocodone-acetaminophen for pain.  She is advised to use ice and/or heat.  Return precautions discussed. ? ?Final Clinical Impression(s) / ED Diagnoses ?Final diagnoses:  ?Torticollis, acute  ?Hypokalemia  ?Microcytic anemia  ?Thyromegaly  ?Thrombocytosis  ? ? ?Rx / DC Orders ?ED Discharge Orders   ? ?      Ordered  ?  potassium chloride SA (KLOR-CON M) 20 MEQ tablet  2 times daily       ? 09/10/21 0502  ?  HYDROcodone-acetaminophen (NORCO/VICODIN) 5-325 MG tablet  Every 4 hours PRN       ? 09/10/21 0502  ? ?  ?  ? ?  ? ? ?  ?Dione Booze, MD ?09/10/21 [phone] ? ?

## 2021-09-10 ENCOUNTER — Emergency Department (HOSPITAL_BASED_OUTPATIENT_CLINIC_OR_DEPARTMENT_OTHER): Payer: Self-pay

## 2021-09-10 LAB — MAGNESIUM: Magnesium: 1.8 mg/dL (ref 1.7–2.4)

## 2021-09-10 LAB — CBC WITH DIFFERENTIAL/PLATELET
Abs Immature Granulocytes: 0.02 10*3/uL (ref 0.00–0.07)
Basophils Absolute: 0.1 10*3/uL (ref 0.0–0.1)
Basophils Relative: 1 %
Eosinophils Absolute: 0 10*3/uL (ref 0.0–0.5)
Eosinophils Relative: 0 %
HCT: 24 % — ABNORMAL LOW (ref 36.0–46.0)
Hemoglobin: 7.7 g/dL — ABNORMAL LOW (ref 12.0–15.0)
Immature Granulocytes: 0 %
Lymphocytes Relative: 28 %
Lymphs Abs: 1.9 10*3/uL (ref 0.7–4.0)
MCH: 22.8 pg — ABNORMAL LOW (ref 26.0–34.0)
MCHC: 32.1 g/dL (ref 30.0–36.0)
MCV: 71.2 fL — ABNORMAL LOW (ref 80.0–100.0)
Monocytes Absolute: 0.5 10*3/uL (ref 0.1–1.0)
Monocytes Relative: 8 %
Neutro Abs: 4.3 10*3/uL (ref 1.7–7.7)
Neutrophils Relative %: 63 %
Platelets: 434 10*3/uL — ABNORMAL HIGH (ref 150–400)
RBC: 3.37 MIL/uL — ABNORMAL LOW (ref 3.87–5.11)
RDW: 22.8 % — ABNORMAL HIGH (ref 11.5–15.5)
Smear Review: NORMAL
WBC: 6.8 10*3/uL (ref 4.0–10.5)
nRBC: 0 % (ref 0.0–0.2)

## 2021-09-10 LAB — HCG, SERUM, QUALITATIVE: Preg, Serum: NEGATIVE

## 2021-09-10 LAB — BASIC METABOLIC PANEL
Anion gap: 8 (ref 5–15)
BUN: 7 mg/dL (ref 6–20)
CO2: 22 mmol/L (ref 22–32)
Calcium: 8.4 mg/dL — ABNORMAL LOW (ref 8.9–10.3)
Chloride: 107 mmol/L (ref 98–111)
Creatinine, Ser: 0.64 mg/dL (ref 0.44–1.00)
GFR, Estimated: >60 mL/min (ref >60)
Glucose, Bld: 101 mg/dL — ABNORMAL HIGH (ref 70–99)
Potassium: 2.9 mmol/L — ABNORMAL LOW (ref 3.5–5.1)
Sodium: 137 mmol/L (ref 135–145)

## 2021-09-10 MED ORDER — POTASSIUM CHLORIDE CRYS ER 20 MEQ PO TBCR: 20.0000 meq | EXTENDED_RELEASE_TABLET | Freq: Two times a day (BID) | ORAL | 0 refills | Status: DC

## 2021-09-10 MED ORDER — POTASSIUM CHLORIDE 10 MEQ/100ML IV SOLN
10.0000 meq | INTRAVENOUS | Status: AC
  Administered 2021-09-10 (×2): 10 meq via INTRAVENOUS
  Filled 2021-09-10 (×2): qty 100

## 2021-09-10 MED ORDER — POTASSIUM CHLORIDE CRYS ER 20 MEQ PO TBCR
40.0000 meq | EXTENDED_RELEASE_TABLET | Freq: Once | ORAL | Status: AC
  Administered 2021-09-10: 40 meq via ORAL
  Filled 2021-09-10: qty 2

## 2021-09-10 MED ORDER — MAGNESIUM SULFATE 2 GM/50ML IV SOLN
2.0000 g | Freq: Once | INTRAVENOUS | Status: AC
  Administered 2021-09-10: 2 g via INTRAVENOUS
  Filled 2021-09-10: qty 50

## 2021-09-10 MED ORDER — HYDROCODONE-ACETAMINOPHEN 5-325 MG PO TABS: 1.0000 | ORAL_TABLET | ORAL | 0 refills | Status: DC | PRN

## 2021-09-10 MED ORDER — IOHEXOL 300 MG/ML  SOLN
75.0000 mL | Freq: Once | INTRAMUSCULAR | Status: AC | PRN
  Administered 2021-09-10: 75 mL via INTRAVENOUS

## 2021-09-10 MED ORDER — MORPHINE SULFATE (PF) 4 MG/ML IV SOLN
4.0000 mg | Freq: Once | INTRAVENOUS | Status: AC
  Administered 2021-09-10: 4 mg via INTRAVENOUS
  Filled 2021-09-10: qty 1

## 2021-09-10 MED ORDER — METHOCARBAMOL 1000 MG/10ML IJ SOLN
INTRAMUSCULAR | Status: AC
  Filled 2021-09-10: qty 10

## 2021-09-10 NOTE — Discharge Instructions (Signed)
Apply ice and/or heat to the sore area on your neck. ? ?Continue taking acetaminophen and/or ibuprofen as needed for pain, reserve hydrocodone-acetaminophen for more severe pain. ? ?Your CT scan did show that your thyroid gland is mildly enlarged.  Recommendation is for you to have a follow-up ultrasound of your thyroid gland.  Your primary care provider can arrange for this. ?

## 2021-09-15 ENCOUNTER — Other Ambulatory Visit: Payer: Self-pay

## 2021-09-15 ENCOUNTER — Encounter (HOSPITAL_BASED_OUTPATIENT_CLINIC_OR_DEPARTMENT_OTHER): Payer: Self-pay | Admitting: Urology

## 2021-09-15 ENCOUNTER — Emergency Department (HOSPITAL_BASED_OUTPATIENT_CLINIC_OR_DEPARTMENT_OTHER)
Admission: EM | Admit: 2021-09-15 | Discharge: 2021-09-16 | Disposition: A | Discharge status: Home / Self Care | Payer: BLUE CROSS/BLUE SHIELD | Attending: Emergency Medicine | Admitting: Emergency Medicine

## 2021-09-15 DIAGNOSIS — E876 Hypokalemia: Secondary | ICD-10-CM | POA: Insufficient documentation

## 2021-09-15 DIAGNOSIS — M7912 Myalgia of auxiliary muscles, head and neck: Secondary | ICD-10-CM | POA: Insufficient documentation

## 2021-09-15 DIAGNOSIS — F17210 Nicotine dependence, cigarettes, uncomplicated: Secondary | ICD-10-CM | POA: Diagnosis not present

## 2021-09-15 DIAGNOSIS — M542 Cervicalgia: Secondary | ICD-10-CM | POA: Diagnosis present

## 2021-09-15 NOTE — ED Triage Notes (Signed)
Pt states swelling to lymph node in left side of neck causing pain that radiated to the left ear and head  ?States started yesterday  ?Denies fever  ? ?

## 2021-09-15 NOTE — ED Provider Notes (Signed)
? ?[facility] ?Provider Note: Georgena Spurling, MD, [place] ? ?CSN: [id number] ?MRN: [id number] ?ARRIVAL: 09/15/21 at 2242 ?ROOM: [id number]/[id number] ? ? ?CHIEF COMPLAINT  ?Neck Pain ? ? ?HISTORY OF PRESENT ILLNESS  ?09/15/21 11:03 PM ?Jenny Evans is a 51 y.o. female with a history of microcytic anemia.  She is here for left-sided neck pain that she states began earlier today (per triage note it started yesterday).  The pain is located in about the region of the upper sternocleidomastoid muscle and radiates to the left side of her head causing a headache and ear pain.  She denies fever or sore throat.  She was in fact seen for the same thing on 09/09/2021 and had a work-up that included an unremarkable CT of the neck with contrast.  She was diagnosed with spasm of the sternocleidomastoid muscle and was treated with hydrocodone/APAP and Flexeril.  She states these have not helped her pain.  Because of the persistence of the pain the patient believes she has an infection that needs antibiotics. ? ?Lab work showed a microcytic anemia with a hemoglobin of 7.7, slightly lower than her baseline.  She was also hypokalemia (2.9) and was prescribed K-Dur; she has a history of hypokalemia in the past. ? ? ?Past Medical History:  ?Diagnosis Date  ? Anemia   ? Asthma   ? Chronic pain   ? GERD (gastroesophageal reflux disease)   ? Migraine   ? Peptic ulcer   ? Sickle cell trait ([place])   ? Ulcer   ? ? ?Past Surgical History:  ?Procedure Laterality Date  ? ESOPHAGOGASTRODUODENOSCOPY (EGD) WITH PROPOFOL N/A 04/04/2013  ? Procedure: ESOPHAGOGASTRODUODENOSCOPY (EGD) WITH PROPOFOL;  Surgeon: Arta Silence, MD;  Location: [facility];  Service: Endoscopy;  Laterality: N/A;  ? EXAMINATION UNDER ANESTHESIA N/A 08/10/2013  ? Procedure: RECTAL EXAM UNDER ANESTHESIA;  Surgeon: Joyice Faster. Cornett, MD;  Location: [facility];  Service: General;  Laterality: N/A;  ligation  ? HEMORRHOID SURGERY    ? MYRINGOTOMY    ? age 55   ? ? ?Family History  ?Problem Relation Age of Onset  ? Heart disease Mother   ?     Arrythmia  ? Kidney disease Mother   ? Hypertension Mother   ? COPD Mother   ? Diabetes Mother   ? Lung cancer Mother   ? Heart disease Father   ? Crohn's disease Maternal Aunt   ? Breast cancer Maternal Aunt   ?     40's  ? ? ?Social History  ? ?Tobacco Use  ? Smoking status: Every Day  ?  Packs/day: 0.25  ?  Years: 15.00  ?  Pack years: 3.75  ?  Types: Cigarettes  ? Smokeless tobacco: Never  ?Vaping Use  ? Vaping Use: Every day  ?Substance Use Topics  ? Alcohol use: Yes  ?  Comment: social  ? Drug use: Never  ? ? ?Prior to Admission medications   ?Medication Sig Start Date End Date Taking? Authorizing Provider  ?cyclobenzaprine (FLEXERIL) 10 MG tablet Take 1 tablet (10 mg total) by mouth 3 (three) times daily as needed for muscle spasms. [id number]   Delora Fuel, MD  ?ferrous sulfate 325 (65 FE) MG tablet Take 1 tablet (325 mg total) by mouth 2 (two) times daily with a meal. 09/23/20   Davonna Belling, MD  ?fluticasone (FLONASE) 50 MCG/ACT nasal spray Place 2 sprays into both nostrils daily for 7 days. 07/30/21 08/29/21  Long, Wonda Olds, MD  ?  HYDROcodone-acetaminophen (NORCO/VICODIN) 5-325 MG tablet Take 1 tablet by mouth every 4 (four) hours as needed. [id number]   Delora Fuel, MD  ?potassium chloride SA (KLOR-CON M) 20 MEQ tablet Take 1 tablet (20 mEq total) by mouth 2 (two) times daily. [id number]   Delora Fuel, MD  ?SUMAtriptan (IMITREX) 6 MG/0.5ML SOLN injection Inject 0.5 mLs (6 mg total) into the skin every 2 (two) hours as needed for migraine or headache. May repeat in 2 hours if headache persists or recurs. (Max two doses per day) 09/06/21   Dorie Rank, MD  ?albuterol (PROVENTIL HFA;VENTOLIN HFA) 108 (90 Base) MCG/ACT inhaler Inhale 1-2 puffs into the lungs every 6 (six) hours as needed for wheezing or shortness of breath. ?Patient not taking: Reported on 03/24/2019 04/08/18 09/22/19  Margette Fast, MD  ?Cetirizine HCl 10 MG CAPS  Take 1 capsule (10 mg total) by mouth daily. 09/22/19 01/23/20  Wieters, Hallie C, PA-C  ?sodium chloride (OCEAN) 0.65 % SOLN nasal spray Place 1 spray into both nostrils as needed for congestion. ?Patient not taking: Reported on 03/24/2019 12/14/18 09/22/19  Arville Lime, PA-C  ? ? ?Allergies ?Hydrocodone-acetaminophen ? ? ?REVIEW OF SYSTEMS  ?Negative except as noted here or in the History of Present Illness. ? ? ?PHYSICAL EXAMINATION  ?Initial Vital Signs ?Blood pressure 125/79, pulse 99, temperature 98.9 ?F (37.2 ?C), temperature source Oral, resp. rate 18, height 5\' 4"  (1.626 m), weight 65.7 kg, last menstrual period 08/17/2021, SpO2 97 %. ? ?Examination ?General: Well-developed, well-nourished female in no acute distress; appearance consistent with age of record ?HENT: normocephalic; atraumatic; TMs normal; no pharyngeal erythema or exudate ?Eyes: pupils equal, round and reactive to light; extraocular muscles intact ?Neck: supple; tenderness of left sternocleidomastoid muscle; no lymphadenopathy ?Heart: regular rate and rhythm ?Lungs: clear to auscultation bilaterally ?Abdomen: soft; nondistended; nontender; bowel sounds present ?Extremities: No deformity; full range of motion ?Neurologic: Awake, alert; motor function intact in all extremities and symmetric; no facial droop ?Skin: Warm and dry ?Psychiatric: Flat affect; poor eye contact; prefers to lie in fetal position with hoodie over her head ? ? ?RESULTS  ?Summary of this visit's results, reviewed and interpreted by myself: ? ? EKG Interpretation ? ?Date/Time:    ?Ventricular Rate:    ?PR Interval:    ?QRS Duration:   ?QT Interval:    ?QTC Calculation:   ?R Axis:     ?Text Interpretation:   ?  ? ?  ? ?Laboratory Studies: ?Results for orders placed or performed during the hospital encounter of 09/15/21 (from the past 24 hour(s))  ?CBC with Differential/Platelet     Status: Abnormal  ? Collection Time: 09/15/21 11:18 PM  ?Result Value Ref Range  ? WBC 6.7 4.0 -  10.5 K/uL  ? RBC 3.81 (L) 3.87 - 5.11 MIL/uL  ? Hemoglobin 8.5 (L) 12.0 - 15.0 g/dL  ? HCT 27.2 (L) 36.0 - 46.0 %  ? MCV 71.4 (L) 80.0 - 100.0 fL  ? MCH 22.3 (L) 26.0 - 34.0 pg  ? MCHC 31.3 30.0 - 36.0 g/dL  ? RDW 22.8 (H) 11.5 - 15.5 %  ? Platelets 356 150 - 400 K/uL  ? nRBC 0.0 0.0 - 0.2 %  ? Neutrophils Relative % 60 %  ? Neutro Abs 4.0 1.7 - 7.7 K/uL  ? Lymphocytes Relative 24 %  ? Lymphs Abs 1.6 0.7 - 4.0 K/uL  ? Monocytes Relative 13 %  ? Monocytes Absolute 0.8 0.1 - 1.0 K/uL  ? Eosinophils Relative 2 %  ?  Eosinophils Absolute 0.2 0.0 - 0.5 K/uL  ? Basophils Relative 1 %  ? Basophils Absolute 0.0 0.0 - 0.1 K/uL  ? WBC Morphology MORPHOLOGY UNREMARKABLE   ? RBC Morphology See Note   ? Smear Review MORPHOLOGY UNREMARKABLE   ? Immature Granulocytes 0 %  ? Abs Immature Granulocytes 0.02 0.00 - 0.07 K/uL  ? Polychromasia PRESENT   ? Target Cells PRESENT   ? Ovalocytes PRESENT   ? Stomatocytes PRESENT   ?Basic metabolic panel     Status: Abnormal  ? Collection Time: 09/15/21 11:18 PM  ?Result Value Ref Range  ? Sodium 134 (L) 135 - 145 mmol/L  ? Potassium 3.5 3.5 - 5.1 mmol/L  ? Chloride 102 98 - 111 mmol/L  ? CO2 26 22 - 32 mmol/L  ? Glucose, Bld 102 (H) 70 - 99 mg/dL  ? BUN 10 6 - 20 mg/dL  ? Creatinine, Ser 0.59 0.44 - 1.00 mg/dL  ? Calcium 9.1 8.9 - 10.3 mg/dL  ? GFR, Estimated >60 >60 mL/min  ? Anion gap 6 5 - 15  ?Mononucleosis screen     Status: None  ? Collection Time: 09/15/21 11:18 PM  ?Result Value Ref Range  ? Mono Screen NEGATIVE NEGATIVE  ? ?Imaging Studies: ?No results found. ? ?ED COURSE and MDM  ?Nursing notes, initial and subsequent vitals signs, including pulse oximetry, reviewed and interpreted by myself. ? ?Vitals:  ? 09/15/21 2251 09/15/21 2330 09/16/21 0000 09/16/21 0030  ?BP:  118/77 103/64 101/64  ?Pulse:  94 91 99  ?Resp:  16  17  ?Temp:      ?TempSrc:      ?SpO2:  100% 98% 99%  ?Weight: 65.7 kg     ?Height: 5\' 4"  (1.626 m)     ? ?Medications  ?oxyCODONE-acetaminophen (PERCOCET/ROXICET)  5-325 MG per tablet 2 tablet (has no administration in time range)  ?cyclobenzaprine (FLEXERIL) tablet 10 mg (has no administration in time range)  ? ? ?Anemia and hypokalemia have improved since last visit.  The

## 2021-09-16 LAB — CBC WITH DIFFERENTIAL/PLATELET
Abs Immature Granulocytes: 0.02 10*3/uL (ref 0.00–0.07)
Basophils Absolute: 0 10*3/uL (ref 0.0–0.1)
Basophils Relative: 1 %
Eosinophils Absolute: 0.2 10*3/uL (ref 0.0–0.5)
Eosinophils Relative: 2 %
HCT: 27.2 % — ABNORMAL LOW (ref 36.0–46.0)
Hemoglobin: 8.5 g/dL — ABNORMAL LOW (ref 12.0–15.0)
Immature Granulocytes: 0 %
Lymphocytes Relative: 24 %
Lymphs Abs: 1.6 10*3/uL (ref 0.7–4.0)
MCH: 22.3 pg — ABNORMAL LOW (ref 26.0–34.0)
MCHC: 31.3 g/dL (ref 30.0–36.0)
MCV: 71.4 fL — ABNORMAL LOW (ref 80.0–100.0)
Monocytes Absolute: 0.8 10*3/uL (ref 0.1–1.0)
Monocytes Relative: 13 %
Neutro Abs: 4 10*3/uL (ref 1.7–7.7)
Neutrophils Relative %: 60 %
Platelets: 356 10*3/uL (ref 150–400)
RBC: 3.81 MIL/uL — ABNORMAL LOW (ref 3.87–5.11)
RDW: 22.8 % — ABNORMAL HIGH (ref 11.5–15.5)
WBC: 6.7 10*3/uL (ref 4.0–10.5)
nRBC: 0 % (ref 0.0–0.2)

## 2021-09-16 LAB — BASIC METABOLIC PANEL
Anion gap: 6 (ref 5–15)
BUN: 10 mg/dL (ref 6–20)
CO2: 26 mmol/L (ref 22–32)
Calcium: 9.1 mg/dL (ref 8.9–10.3)
Chloride: 102 mmol/L (ref 98–111)
Creatinine, Ser: 0.59 mg/dL (ref 0.44–1.00)
GFR, Estimated: >60 mL/min (ref >60)
Glucose, Bld: 102 mg/dL — ABNORMAL HIGH (ref 70–99)
Potassium: 3.5 mmol/L (ref 3.5–5.1)
Sodium: 134 mmol/L — ABNORMAL LOW (ref 135–145)

## 2021-09-16 LAB — MONONUCLEOSIS SCREEN: Mono Screen: NEGATIVE

## 2021-09-16 MED ORDER — OXYCODONE-ACETAMINOPHEN 5-325 MG PO TABS
2.0000 | ORAL_TABLET | Freq: Once | ORAL | Status: AC
  Administered 2021-09-16: 2 via ORAL
  Filled 2021-09-16: qty 2

## 2021-09-16 MED ORDER — CYCLOBENZAPRINE HCL 10 MG PO TABS
10.0000 mg | ORAL_TABLET | Freq: Once | ORAL | Status: AC
  Administered 2021-09-16: 10 mg via ORAL
  Filled 2021-09-16: qty 1

## 2021-09-16 NOTE — ED Notes (Signed)
Patient states she is being picked up by her sister. Patient was given pain medication and discharge instructions.  ?

## 2021-09-19 ENCOUNTER — Other Ambulatory Visit: Payer: Self-pay

## 2021-09-19 ENCOUNTER — Emergency Department (HOSPITAL_BASED_OUTPATIENT_CLINIC_OR_DEPARTMENT_OTHER)
Admission: EM | Admit: 2021-09-19 | Discharge: 2021-09-19 | Disposition: A | Discharge status: Home / Self Care | Payer: BLUE CROSS/BLUE SHIELD | Attending: Emergency Medicine | Admitting: Emergency Medicine

## 2021-09-19 ENCOUNTER — Encounter (HOSPITAL_BASED_OUTPATIENT_CLINIC_OR_DEPARTMENT_OTHER): Payer: Self-pay | Admitting: *Deleted

## 2021-09-19 DIAGNOSIS — F17200 Nicotine dependence, unspecified, uncomplicated: Secondary | ICD-10-CM | POA: Insufficient documentation

## 2021-09-19 DIAGNOSIS — M62838 Other muscle spasm: Secondary | ICD-10-CM | POA: Diagnosis not present

## 2021-09-19 DIAGNOSIS — M542 Cervicalgia: Secondary | ICD-10-CM | POA: Diagnosis present

## 2021-09-19 MED ORDER — KETOROLAC TROMETHAMINE 30 MG/ML IJ SOLN
30.0000 mg | Freq: Once | INTRAMUSCULAR | Status: AC
  Administered 2021-09-19: 30 mg via INTRAMUSCULAR
  Filled 2021-09-19: qty 1

## 2021-09-19 MED ORDER — HYDROCODONE-ACETAMINOPHEN 5-325 MG PO TABS
1.0000 | ORAL_TABLET | Freq: Once | ORAL | Status: AC
  Administered 2021-09-19: 1 via ORAL
  Filled 2021-09-19: qty 1

## 2021-09-19 MED ORDER — OXYCODONE-ACETAMINOPHEN 5-325 MG PO TABS
1.0000 | ORAL_TABLET | Freq: Four times a day (QID) | ORAL | 0 refills | Status: DC | PRN
Start: 2021-09-19 — End: 2021-09-25

## 2021-09-19 NOTE — ED Notes (Signed)
Pt states she wants to see the ED MD prior to any care provided, states it her neck that is causing her migraine HA, states her neck pain on left side has been hurting for several weeks ?

## 2021-09-19 NOTE — ED Notes (Signed)
States no relief from pain med, requesting to see provider ?

## 2021-09-19 NOTE — ED Notes (Signed)
ED Provider at bedside. 

## 2021-09-19 NOTE — Discharge Instructions (Addendum)
You were seen today for left-sided neck pain.  I recommend alternating ice and heat on the muscle on the left side to help with inflammation and spasms. I have provided a short course of pain medication to be used as needed. I recommend follow up with sports medicine.  ?

## 2021-09-19 NOTE — ED Notes (Signed)
States has hx of migraines but no primary MD.  ?

## 2021-09-19 NOTE — ED Triage Notes (Signed)
Presents with HA, states onset was one hour ago, states took ibuprofen without relief, states HA is at left temporal ?

## 2021-09-19 NOTE — ED Notes (Signed)
Requesting DC instructions and work note, states her pain is still a 9-10 on 0-10 scale, stated she was going to get her Rx and take medication at home. Instructions reviewed and work note provided ?

## 2021-09-19 NOTE — ED Provider Notes (Signed)
?[facility] ?Provider Note ? ? ?CSN: [id number] ?Arrival date & time: 09/19/21  0936 ? ?  ? ?History ? ?Chief Complaint  ?Patient presents with  ? Neck Pain  ? ? ?Jenny Evans is a 51 y.o. female.  Patient presents to the hospital complaining of left-sided neck pain that began just over an hour ago.  The pain is located near the upper sternocleidomastoid muscle and radiates the left side of her head.  She states that the pain when radiating causes a severe headache and ear pain.  She denies fever.  Denies sore throat.  She has been seen for the same thing on March 27 and on April 2 this year.  A CT of the neck was done with contrast at those visits which was unremarkable.  She has been diagnosed with spasm of sternocleidomastoid muscle and has been treated in the past with pain medication and Flexeril.  She states that these were not beneficial long-term.  PMH significant for sickle cell trait, history of headaches, GERD, migraines, tobacco abuse ? ?HPI ? ?  ? ?Home Medications ?Prior to Admission medications   ?Medication Sig Start Date End Date Taking? Authorizing Provider  ?oxyCODONE-acetaminophen (PERCOCET/ROXICET) 5-325 MG tablet Take 1 tablet by mouth every 6 (six) hours as needed for severe pain. 09/19/21  Yes Darrick Grinder, PA-C  ?cyclobenzaprine (FLEXERIL) 10 MG tablet Take 1 tablet (10 mg total) by mouth 3 (three) times daily as needed for muscle spasms. 09/08/21   Dione Booze, MD  ?ferrous sulfate 325 (65 FE) MG tablet Take 1 tablet (325 mg total) by mouth 2 (two) times daily with a meal. 09/23/20   Benjiman Core, MD  ?fluticasone (FLONASE) 50 MCG/ACT nasal spray Place 2 sprays into both nostrils daily for 7 days. 07/30/21 08/29/21  Long, [name], MD  ?HYDROcodone-acetaminophen (NORCO/VICODIN) 5-325 MG tablet Take 1 tablet by mouth every 4 (four) hours as needed. 09/10/21   Dione Booze, MD  ?potassium chloride SA (KLOR-CON M) 20 MEQ tablet Take 1 tablet (20 mEq total)  by mouth 2 (two) times daily. 09/10/21   Dione Booze, MD  ?SUMAtriptan (IMITREX) 6 MG/0.5ML SOLN injection Inject 0.5 mLs (6 mg total) into the skin every 2 (two) hours as needed for migraine or headache. May repeat in 2 hours if headache persists or recurs. (Max two doses per day) 09/06/21   Linwood Dibbles, MD  ?albuterol (PROVENTIL HFA;VENTOLIN HFA) 108 (90 Base) MCG/ACT inhaler Inhale 1-2 puffs into the lungs every 6 (six) hours as needed for wheezing or shortness of breath. ?Patient not taking: Reported on 03/24/2019 04/08/18 09/22/19  Maia Plan, MD  ?Cetirizine HCl 10 MG CAPS Take 1 capsule (10 mg total) by mouth daily. 09/22/19 01/23/20  Wieters, Hallie C, PA-C  ?sodium chloride (OCEAN) 0.65 % SOLN nasal spray Place 1 spray into both nostrils as needed for congestion. ?Patient not taking: Reported on 03/24/2019 12/14/18 09/22/19  Leretha Dykes, PA-C  ?   ? ?Allergies    ?Hydrocodone-acetaminophen   ? ?Review of Systems   ?Review of Systems  ?Eyes:  Positive for photophobia.  ?Musculoskeletal:  Positive for neck pain. Negative for neck stiffness.  ?Neurological:  Positive for headaches.  ? ?Physical Exam ?Updated Vital Signs ?BP 111/77 (BP Location: Right Arm)   Pulse 86   Temp 98.2 ?F (36.8 ?C) (Oral)   Resp 16   Ht 5\' 4"  (1.626 m)   Wt 66 kg   SpO2 100%   BMI  24.98 kg/m?  ?Physical Exam ?Vitals and nursing note reviewed.  ?Constitutional:   ?   Appearance: She is normal weight.  ?HENT:  ?   Head: Normocephalic and atraumatic.  ?   Right Ear: External ear normal.  ?   Left Ear: External ear normal.  ?   Ears:  ?   Comments: Patient using earplug in left ear ?   Mouth/Throat:  ?   Mouth: Mucous membranes are moist.  ?Eyes:  ?   Pupils: Pupils are equal, round, and reactive to light.  ?Cardiovascular:  ?   Rate and Rhythm: Normal rate and regular rhythm.  ?   Pulses: Normal pulses.  ?Pulmonary:  ?   Effort: Pulmonary effort is normal.  ?   Breath sounds: Normal breath sounds.  ?Musculoskeletal:     ?   General:  No swelling. Normal range of motion.  ?   Cervical back: Normal range of motion. Tenderness (Tenderness along sternocleidomastoid muscle on left) present. No rigidity.  ?Skin: ?   General: Skin is warm and dry.  ?Neurological:  ?   General: No focal deficit present.  ?   Mental Status: She is alert and oriented to person, place, and time.  ? ? ?ED Results / Procedures / Treatments   ?Labs ?(all labs ordered are listed, but only abnormal results are displayed) ?Labs Reviewed - No data to display ? ?EKG ?None ? ?Radiology ?No results found. ? ?Procedures ?Procedures  ? ? ?Medications Ordered in ED ?Medications  ?HYDROcodone-acetaminophen (NORCO/VICODIN) 5-325 MG per tablet 1 tablet (1 tablet Oral Given 09/19/21 1012)  ?ketorolac (TORADOL) 30 MG/ML injection 30 mg (30 mg Intramuscular Given 09/19/21 1051)  ? ? ?ED Course/ Medical Decision Making/ A&P ?  ?                        ?Medical Decision Making ?Risk ?Prescription drug management. ? ? ?The patient presents with left sided neck pain.  She has been seen multiple times for the same pain has been diagnosed with muscle spasms in the past. She has no neurological deficits. No fever.  At this time I believe referral to sports medicine may be warranted.  There is not much more work-up to be done from an emergency perspective. ? ?I gave the patient a Norco and Toradol  for her pain.  After reevaluation the pain had slightly improved. ? ?I see no benefit to additional imaging at this time.  The patient has recently had a CT of the neck which was unremarkable.  Symptoms have not changed. ? ?I reviewed notes from March 26, March 27, and September 15, 2021 from the emergency department. ? ?I believe discharge home with outpatient follow-up with sports medicine would be the most beneficial plan for the patient at this time.  I discussed this with the patient and she voiced understanding.  Discharge home with short course of Percocet ? ? ?Final Clinical Impression(s) / ED  Diagnoses ?Final diagnoses:  ?Neck pain  ?Muscle spasms of neck  ? ? ?Rx / DC Orders ?ED Discharge Orders   ? ?      Ordered  ?  oxyCODONE-acetaminophen (PERCOCET/ROXICET) 5-325 MG tablet  Every 6 hours PRN       ? 09/19/21 1052  ? ?  ?  ? ?  ? ? ?  ?Darrick Grinder, PA-C ?09/19/21 1631 ? ?  ?Vanetta Mulders, MD ?09/21/21 1731 ? ?

## 2021-09-25 ENCOUNTER — Other Ambulatory Visit: Payer: Self-pay

## 2021-09-25 ENCOUNTER — Emergency Department (HOSPITAL_BASED_OUTPATIENT_CLINIC_OR_DEPARTMENT_OTHER)
Admission: EM | Admit: 2021-09-25 | Discharge: 2021-09-25 | Disposition: A | Discharge status: Home / Self Care | Payer: BLUE CROSS/BLUE SHIELD | Attending: Emergency Medicine | Admitting: Emergency Medicine

## 2021-09-25 ENCOUNTER — Encounter (HOSPITAL_BASED_OUTPATIENT_CLINIC_OR_DEPARTMENT_OTHER): Payer: Self-pay

## 2021-09-25 DIAGNOSIS — X58XXXA Exposure to other specified factors, initial encounter: Secondary | ICD-10-CM | POA: Diagnosis not present

## 2021-09-25 DIAGNOSIS — S161XXA Strain of muscle, fascia and tendon at neck level, initial encounter: Secondary | ICD-10-CM | POA: Diagnosis not present

## 2021-09-25 DIAGNOSIS — S199XXA Unspecified injury of neck, initial encounter: Secondary | ICD-10-CM | POA: Diagnosis present

## 2021-09-25 MED ORDER — METHOCARBAMOL 1000 MG PO TABS: 1000.0000 mg | ORAL_TABLET | Freq: Three times a day (TID) | ORAL | 0 refills | Status: DC | PRN

## 2021-09-25 MED ORDER — LIDOCAINE 5 % EX PTCH
1.0000 | MEDICATED_PATCH | Freq: Once | CUTANEOUS | Status: DC
  Administered 2021-09-25: 1 via TRANSDERMAL
  Filled 2021-09-25: qty 1

## 2021-09-25 MED ORDER — NAPROXEN 375 MG PO TABS: 375.0000 mg | ORAL_TABLET | Freq: Two times a day (BID) | ORAL | 0 refills | Status: DC

## 2021-09-25 MED ORDER — KETOROLAC TROMETHAMINE 30 MG/ML IJ SOLN
30.0000 mg | Freq: Once | INTRAMUSCULAR | Status: AC
  Administered 2021-09-25: 30 mg via INTRAMUSCULAR
  Filled 2021-09-25: qty 1

## 2021-09-25 MED ORDER — LIDOCAINE 5 % EX PTCH: 1.0000 | MEDICATED_PATCH | CUTANEOUS | 0 refills | Status: DC

## 2021-09-25 NOTE — Discharge Instructions (Addendum)
Return to the ER if you develop fever, neck stiffness, vomiting, dizziness, vision changes, severe headache, weakness or numbness in your extremities, trouble speaking, or any other new/concerning symptoms. ? ?You are being prescribed naproxen which is an NSAID.  Do not take Ibuprofen/Advil/Aleve/Motrin/Goody Powders/BC powders/Meloxicam/Diclofenac/Indomethacin and other Nonsteroidal anti-inflammatory medications.  You may still take Tylenol. ? ?You are being prescribed Robaxin which is a muscle relaxer.  You cannot combine this with other medications such as narcotics or alcohol.  Do not operate heavy machinery with this. ?

## 2021-09-25 NOTE — ED Provider Notes (Signed)
?[facility] ?Provider Note ? ? ?CSN: [id number] ?Arrival date & time: 09/25/21  [id number] ? ?  ? ?History ? ?Chief Complaint  ?Patient presents with  ? Neck Pain  ? ? ?Jenny Evans is a 51 y.o. female. ? ?HPI ?51 year old female presents with left-sided neck pain.  This has been ongoing for about 2 weeks.  She woke up with it 1 day.  Its been in the same area ever since it started and comes and goes.  She worked a 12-hour shift up until midnight last night and it seemed worse.  She has been trying Tylenol which has not helped.  She has run out of the Flexeril as well as the narcotics given.  No fevers.  Sometimes this gives her headache but no headache currently.  No visual complaints, dizziness, weakness or numbness.  No neck stiffness.  Sometimes it feels tight when she moves her neck.  She reports she has a follow-up with sports medicine in less than a week. ? ?Home Medications ?Prior to Admission medications   ?Medication Sig Start Date End Date Taking? Authorizing Provider  ?lidocaine (LIDODERM) 5 % Place 1 patch onto the skin daily. Remove & Discard patch within 12 hours or as directed by MD 09/25/21  Yes Sherwood Gambler, MD  ?methocarbamol 1000 MG TABS Take 1,000 mg by mouth every 8 (eight) hours as needed for muscle spasms. 09/25/21  Yes Sherwood Gambler, MD  ?naproxen (NAPROSYN) 375 MG tablet Take 1 tablet (375 mg total) by mouth 2 (two) times daily. 09/25/21  Yes Sherwood Gambler, MD  ?ferrous sulfate 325 (65 FE) MG tablet Take 1 tablet (325 mg total) by mouth 2 (two) times daily with a meal. 09/23/20   Davonna Belling, MD  ?fluticasone (FLONASE) 50 MCG/ACT nasal spray Place 2 sprays into both nostrils daily for 7 days. 07/30/21 08/29/21  Long, Wonda Olds, MD  ?potassium chloride SA (KLOR-CON M) 20 MEQ tablet Take 1 tablet (20 mEq total) by mouth 2 (two) times daily. [id number]   Delora Fuel, MD  ?SUMAtriptan (IMITREX) 6 MG/0.5ML SOLN injection Inject 0.5 mLs (6 mg total) into the skin  every 2 (two) hours as needed for migraine or headache. May repeat in 2 hours if headache persists or recurs. (Max two doses per day) 09/06/21   Dorie Rank, MD  ?albuterol (PROVENTIL HFA;VENTOLIN HFA) 108 (90 Base) MCG/ACT inhaler Inhale 1-2 puffs into the lungs every 6 (six) hours as needed for wheezing or shortness of breath. ?Patient not taking: Reported on 03/24/2019 04/08/18 09/22/19  Margette Fast, MD  ?Cetirizine HCl 10 MG CAPS Take 1 capsule (10 mg total) by mouth daily. 09/22/19 01/23/20  Wieters, Hallie C, PA-C  ?sodium chloride (OCEAN) 0.65 % SOLN nasal spray Place 1 spray into both nostrils as needed for congestion. ?Patient not taking: Reported on 03/24/2019 12/14/18 09/22/19  Arville Lime, PA-C  ?   ? ?Allergies    ?Hydrocodone-acetaminophen   ? ?Review of Systems   ?Review of Systems  ?Constitutional:  Negative for fever.  ?Respiratory:  Negative for shortness of breath.   ?Musculoskeletal:  Positive for neck pain. Negative for neck stiffness.  ?Neurological:  Negative for weakness and numbness.  ? ?Physical Exam ?Updated Vital Signs ?BP 123/86 (BP Location: Left Arm)   Pulse (!) 101   Temp 98.1 ?F (36.7 ?C) (Oral)   Resp 18   Ht 5\' 4"  (1.626 m)   Wt 66 kg   LMP 09/11/2021 (Approximate)   SpO2  100%   BMI 24.98 kg/m?  ?Physical Exam ?Vitals and nursing note reviewed.  ?Constitutional:   ?   General: She is not in acute distress. ?   Appearance: She is well-developed. She is not ill-appearing or diaphoretic.  ?HENT:  ?   Head: Normocephalic and atraumatic.  ?Eyes:  ?   Extraocular Movements: Extraocular movements intact.  ?   Pupils: Pupils are equal, round, and reactive to light.  ?Neck:  ? ?Cardiovascular:  ?   Rate and Rhythm: Normal rate and regular rhythm.  ?   Heart sounds: Normal heart sounds.  ?   Comments: HR~100 ?Pulmonary:  ?   Effort: Pulmonary effort is normal.  ?   Breath sounds: Normal breath sounds.  ?Abdominal:  ?   General: There is no distension.  ?Musculoskeletal:  ?   Cervical  back: Normal range of motion and neck supple. No rigidity.  ?Skin: ?   General: Skin is warm and dry.  ?Neurological:  ?   Mental Status: She is alert.  ?   Comments: CN 3-12 grossly intact. 5/5 strength in all 4 extremities. Grossly normal sensation. Normal finger to nose.   ? ? ?ED Results / Procedures / Treatments   ?Labs ?(all labs ordered are listed, but only abnormal results are displayed) ?Labs Reviewed - No data to display ? ?EKG ?None ? ?Radiology ?No results found. ? ?Procedures ?Procedures  ? ? ?Medications Ordered in ED ?Medications  ?ketorolac (TORADOL) 30 MG/ML injection 30 mg (has no administration in time range)  ? ? ?ED Course/ Medical Decision Making/ A&P ?  ?                        ?Medical Decision Making ?Risk ?Prescription drug management. ? ? ?Patient presents with recurrent and continued neck pain.  I reviewed her chart and she has been to this emergency department and urgent care combined 5 times and now 6 since 3/26.  She went to urgent care 2 days ago and was given a Decadron shot according to chart review.  Here she is not febrile and has no neuro symptoms.  She had a CT neck with contrast and while it was not a CT angiography I doubt both deep space infection or arterial/venous abnormality.  At this point, it is unclear why she has continued pain but I do not think refilling narcotics is warranted.  She is asking for more pain medicine and she has been prescribed both hydrocodone and oxycodone over the last couple weeks.  I think we can continue NSAIDs, try different muscle relaxer and some topical therapy.  However, I do not think she needs repeat blood work, scanning, or any invasive investigation such as LP given very low suspicion for CNS infection like meningitis, deep space neck infection, arterial dissection, etc.  Given return precautions. ? ? ? ? ? ? ? ?Final Clinical Impression(s) / ED Diagnoses ?Final diagnoses:  ?Strain of neck muscle, initial encounter  ? ? ?Rx / DC  Orders ?ED Discharge Orders   ? ?      Ordered  ?  lidocaine (LIDODERM) 5 %  Every 24 hours       ? 09/25/21 0742  ?  methocarbamol 1000 MG TABS  Every 8 hours PRN       ? 09/25/21 0742  ?  naproxen (NAPROSYN) 375 MG tablet  2 times daily       ? 09/25/21 [id number]  ? ?  ?  ? ?  ? ? ?  ?  Sherwood Gambler, MD ?09/25/21 [phone] ? ?

## 2021-09-25 NOTE — ED Triage Notes (Signed)
Pt in for recheck neck pain. Reports woke up with left side neck spasms.  ?

## 2021-09-30 ENCOUNTER — Ambulatory Visit: Payer: BLUE CROSS/BLUE SHIELD | Admitting: Family Medicine

## 2021-10-04 ENCOUNTER — Other Ambulatory Visit: Payer: Self-pay

## 2021-10-04 ENCOUNTER — Encounter: Payer: Self-pay | Admitting: Family Medicine

## 2021-10-04 ENCOUNTER — Ambulatory Visit (INDEPENDENT_AMBULATORY_CARE_PROVIDER_SITE_OTHER): Payer: BLUE CROSS/BLUE SHIELD | Admitting: Family Medicine

## 2021-10-04 VITALS — BP 108/80 | Ht 64.0 in | Wt 148.0 lb

## 2021-10-04 DIAGNOSIS — E049 Nontoxic goiter, unspecified: Secondary | ICD-10-CM | POA: Diagnosis not present

## 2021-10-04 DIAGNOSIS — G500 Trigeminal neuralgia: Secondary | ICD-10-CM

## 2021-10-04 DIAGNOSIS — M26622 Arthralgia of left temporomandibular joint: Secondary | ICD-10-CM | POA: Diagnosis not present

## 2021-10-04 DIAGNOSIS — D509 Iron deficiency anemia, unspecified: Secondary | ICD-10-CM | POA: Diagnosis not present

## 2021-10-04 MED ORDER — CARBAMAZEPINE ER 100 MG PO CP12: 100.0000 mg | ORAL_CAPSULE | Freq: Every day | ORAL | 1 refills | Status: DC

## 2021-10-04 NOTE — Assessment & Plan Note (Signed)
Pathology smear showing anemia with likely thalassemia  ?- counseled on supportive care ?- iron and ferritin  ?

## 2021-10-04 NOTE — Progress Notes (Signed)
?  Jenny Evans - 51 y.o. female MRN [id number]  Date of birth: 10/24/1970 ? ?SUBJECTIVE:  Including CC & ROS.  ?No chief complaint on file. ? ? ?Jenny Evans is a 51 y.o. female that is presenting with acutely occurring left-sided neck and head pain.  The pain is severe in nature and intermittent.  She denies any recent viral infection.  No rash.  The pain extends from the lower edge of the mandible and upper face.  No improvement with gabapentin, anti-inflammatories or pain medications.  No recent injury.  No dental infection or procedure.. ? ?Independent review of the emergency department note from 4/12 shows she was provided Lidoderm patches as well as Robaxin and naproxen. ?Independent review of the office note from 4/10 shows she was provided Decadron for neck pain as well as Voltaren gel. ?Review of the emergency department note from 4/6 shows she was provided Percocet for neck pain. ?Independent review of the CT soft tissue neck with contrast on 3/28 shows an enlarged thyroid. ? ? ?Review of Systems ?See HPI  ? ?HISTORY: Past Medical, Surgical, Social, and Family History Reviewed & Updated per EMR.   ?Pertinent Historical Findings include: ? ?Past Medical History:  ?Diagnosis Date  ? Anemia   ? Asthma   ? Chronic pain   ? GERD (gastroesophageal reflux disease)   ? Migraine   ? Peptic ulcer   ? Sickle cell trait ([place])   ? Ulcer   ? ? ?Past Surgical History:  ?Procedure Laterality Date  ? ESOPHAGOGASTRODUODENOSCOPY (EGD) WITH PROPOFOL N/A 04/04/2013  ? Procedure: ESOPHAGOGASTRODUODENOSCOPY (EGD) WITH PROPOFOL;  Surgeon: Arta Silence, MD;  Location: [facility];  Service: Endoscopy;  Laterality: N/A;  ? EXAMINATION UNDER ANESTHESIA N/A 08/10/2013  ? Procedure: RECTAL EXAM UNDER ANESTHESIA;  Surgeon: Joyice Faster. Cornett, MD;  Location: [facility];  Service: General;  Laterality: N/A;  ligation  ? HEMORRHOID SURGERY    ? MYRINGOTOMY    ? age 51  ? ? ? ?PHYSICAL EXAM:  ?VS: BP 108/80 (BP  Location: Left Arm, Patient Position: Sitting)   Ht 5\' 4"  (1.626 m)   Wt 148 lb (67.1 kg)   LMP 09/11/2021 (Approximate)   BMI 25.40 kg/m?  ?Physical Exam ?Gen: NAD, alert, cooperative with exam, well-appearing ?MSK:  ?Neurovascularly intact   ? ? ? ? ?ASSESSMENT & PLAN:  ? ?Enlarged thyroid ?Acutely occurring.  Was observed on recent CT scan. ?-Counseled on supportive care. ?-TSH. ?-Check ultrasound of the thyroid. ? ?Trigeminal neuralgia of left side of face ?Acutely occurring.  Having symptoms on the left side that seem more consistent with a trigeminal neuralgia as opposed to nerve impingement from the spinal canal. ?-Counseled on home exercise therapy and supportive care. ?-Carbamazepine. ?-MRI of the trigeminal nerve the left side to evaluate for compression and for presurgical planning. ? ?Microcytic anemia ?Pathology smear showing anemia with likely thalassemia  ?- counseled on supportive care ?- iron and ferritin  ? ? ? ? ?

## 2021-10-04 NOTE — Assessment & Plan Note (Signed)
Acutely occurring.  Having symptoms on the left side that seem more consistent with a trigeminal neuralgia as opposed to nerve impingement from the spinal canal. ?-Counseled on home exercise therapy and supportive care. ?-Carbamazepine. ?-MRI of the trigeminal nerve the left side to evaluate for compression and for presurgical planning. ?

## 2021-10-04 NOTE — Patient Instructions (Signed)
Nice to meet you ?I will call with the results from today  ?The thyroid ultrasound will be downstairs  ?We'll get the MRi at [facility] imaging   ?Please send me a message in MyChart with any questions or updates.  ?We'll get setup a virtual visit once the MRi is resulted.  ? ?--Dr. Jordan Likes ? ?

## 2021-10-04 NOTE — Assessment & Plan Note (Signed)
Acutely occurring.  Was observed on recent CT scan. ?-Counseled on supportive care. ?-TSH. ?-Check ultrasound of the thyroid. ?

## 2021-10-05 LAB — IRON,TIBC AND FERRITIN PANEL
Ferritin: 36 ng/mL (ref 15–150)
Iron Saturation: 5 % — CL (ref 15–55)
Iron: 22 ug/dL — ABNORMAL LOW (ref 27–159)
Total Iron Binding Capacity: 431 ug/dL (ref 250–450)
UIBC: 409 ug/dL (ref 131–425)

## 2021-10-05 LAB — TSH: TSH: 0.329 u[IU]/mL — ABNORMAL LOW (ref 0.450–4.500)

## 2021-10-07 ENCOUNTER — Telehealth: Payer: Self-pay | Admitting: Family Medicine

## 2021-10-07 ENCOUNTER — Encounter: Payer: Self-pay | Admitting: *Deleted

## 2021-10-07 DIAGNOSIS — E049 Nontoxic goiter, unspecified: Secondary | ICD-10-CM

## 2021-10-07 NOTE — Telephone Encounter (Signed)
Pt informed of below.  Please place lab orders.  ?

## 2021-10-07 NOTE — Telephone Encounter (Signed)
See other 10/07/21 tele encounter.  ?

## 2021-10-07 NOTE — Telephone Encounter (Signed)
Patient returned providers call for results-- ---forwarding message to med asst to call her back @ [phone]. ?-glh ?

## 2021-10-07 NOTE — Telephone Encounter (Signed)
Left VM for patient. If she calls back please have her speak with a nurse/CMA and inform that her TSH is low which is can cause hyperthyroidism. We need to get further lab work .  ? ?If any questions then please take the best time and phone number to call and I will try to call her back.  ? ?Myra Rude, MD ?[facility] ?10/07/2021, 10:02 AM ? ? ?

## 2021-10-08 ENCOUNTER — Ambulatory Visit (HOSPITAL_BASED_OUTPATIENT_CLINIC_OR_DEPARTMENT_OTHER)
Admission: RE | Admit: 2021-10-08 | Discharge: 2021-10-08 | Disposition: A | Discharge status: Home / Self Care | Payer: PRIVATE HEALTH INSURANCE | Source: Ambulatory Visit | Attending: Family Medicine | Admitting: Family Medicine

## 2021-10-08 DIAGNOSIS — E049 Nontoxic goiter, unspecified: Secondary | ICD-10-CM | POA: Diagnosis present

## 2021-10-08 NOTE — Telephone Encounter (Signed)
Patient picked up lab requisitions.  ?

## 2021-10-09 LAB — T4, FREE: Free T4: 0.91 ng/dL (ref 0.82–1.77)

## 2021-10-09 LAB — T3, FREE: T3, Free: 2.6 pg/mL (ref 2.0–4.4)

## 2021-10-10 ENCOUNTER — Telehealth: Payer: Self-pay | Admitting: Family Medicine

## 2021-10-10 NOTE — Telephone Encounter (Signed)
Left VM for patient. If she calls back please have her speak with a nurse/CMA and inform that her lab work is suggesting subclinical hyperthyroidism.  We need to test her labs on a monthly basis.  Her ultrasound of the thyroid showed nodules but nothing that was requiring a biopsy at this time..  ? ?If any questions then please take the best time and phone number to call and I will try to call her back.  ? ?Rosemarie Ax, MD ?[facility] ?10/10/2021, 1:14 PM ? ? ?

## 2021-10-10 NOTE — Telephone Encounter (Signed)
Pt informed of below.  

## 2021-10-24 ENCOUNTER — Ambulatory Visit
Admission: RE | Admit: 2021-10-24 | Discharge: 2021-10-24 | Disposition: A | Discharge status: Home / Self Care | Payer: PRIVATE HEALTH INSURANCE | Source: Ambulatory Visit | Attending: Family Medicine | Admitting: Family Medicine

## 2021-10-24 DIAGNOSIS — G500 Trigeminal neuralgia: Secondary | ICD-10-CM

## 2021-10-24 DIAGNOSIS — M26622 Arthralgia of left temporomandibular joint: Secondary | ICD-10-CM

## 2021-10-24 MED ORDER — GADOBENATE DIMEGLUMINE 529 MG/ML IV SOLN
12.0000 mL | Freq: Once | INTRAVENOUS | Status: AC | PRN
  Administered 2021-10-24: 12 mL via INTRAVENOUS

## 2021-10-28 ENCOUNTER — Telehealth: Payer: Self-pay | Admitting: Family Medicine

## 2021-10-28 NOTE — Telephone Encounter (Signed)
Pt called states she had the MRI this weekend & had started taking the Rx but think is having a reaction to the meds (face swelling & throat itching/scratchy) ? ?--Forwarding urgent msg to med asst for call back. ? ?-glh ?

## 2021-10-28 NOTE — Telephone Encounter (Signed)
I called pt- she states carbamazepine 100 mg is not working. She states initially the medication helped. She now c/o trouble swallowing and left side facial swelling with pain.  She denies trouble breathing. She had her MRI on 10/24/21. Please advise.  ?

## 2021-10-29 ENCOUNTER — Encounter: Payer: Self-pay | Admitting: Physician Assistant

## 2021-10-29 ENCOUNTER — Telehealth (INDEPENDENT_AMBULATORY_CARE_PROVIDER_SITE_OTHER): Payer: BLUE CROSS/BLUE SHIELD | Admitting: Family Medicine

## 2021-10-29 DIAGNOSIS — G500 Trigeminal neuralgia: Secondary | ICD-10-CM

## 2021-10-29 MED ORDER — MELOXICAM 15 MG PO TABS: ORAL_TABLET | ORAL | 3 refills | Status: DC

## 2021-10-29 NOTE — Progress Notes (Signed)
Virtual Visit via Telephone Note ? ?I connected with Jenny Evans on 10/29/21 at 11:10 AM EDT by telephone and verified that I am speaking with the correct person using two identifiers. ? ?Location: ?Patient: work ?Provider: office ?  ?I discussed the limitations, risks, security and privacy concerns of performing an evaluation and management service by telephone and the availability of in person appointments. I also discussed with the patient that there may be a patient responsible charge related to this service. The patient expressed understanding and agreed to proceed. ? ? ?History of Present Illness: ? ?Ms. Tippens is a 51 year old female that is following up after the MRI of the trigeminal nerve.  This was not showing an acute change.  She continues to have severe pain.  Initially the carbamazepine did help with the pain.  The pain is still occurring in the left side of the face and head. ?  ?Observations/Objective: ? ? ?Assessment and Plan: ? ?Trigeminal neuralgia of the left side of face: ?Acutely occurring.  She is still having severe pain on the left side of her face.  The MRI was unrevealing for any structural changes.  She did get initial improvement with the carbamazepine.  Still does not seem to be originating from the cervical spine. ?-Counseled on home exercise therapy and supportive care. ?-Adding meloxicam. ?-Increase carbamazepine to 300 potentially 400 mg daily. ?- Referral to neurology. ? ?Follow Up Instructions: ? ?  ?I discussed the assessment and treatment plan with the patient. The patient was provided an opportunity to ask questions and all were answered. The patient agreed with the plan and demonstrated an understanding of the instructions. ?  ?The patient was advised to call back or seek an in-person evaluation if the symptoms worsen or if the condition fails to improve as anticipated. ? ?I provided 12 minutes of non-face-to-face time during this encounter. ? ? ?Clare Gandy,  MD ? ? ? ? ?

## 2021-10-29 NOTE — Telephone Encounter (Signed)
Spoke with patient in virtual visit today.  ? ?Myra Rude, MD ?[facility] ?10/29/2021, 11:57 AM ? ?

## 2021-10-29 NOTE — Assessment & Plan Note (Signed)
Acutely occurring.  She is still having severe pain on the left side of her face.  The MRI was unrevealing for any structural changes.  She did get initial improvement with the carbamazepine.  Still does not seem to be originating from the cervical spine. ?-Counseled on home exercise therapy and supportive care. ?-Adding meloxicam. ?-Increase carbamazepine to 300 potentially 400 mg daily. ?- Referral to neurology. ?

## 2021-11-04 ENCOUNTER — Encounter: Payer: Self-pay | Admitting: Physician Assistant

## 2021-11-04 ENCOUNTER — Ambulatory Visit (INDEPENDENT_AMBULATORY_CARE_PROVIDER_SITE_OTHER): Payer: PRIVATE HEALTH INSURANCE | Admitting: Physician Assistant

## 2021-11-04 VITALS — BP 114/80 | HR 76 | Resp 18 | Ht 64.0 in | Wt 139.0 lb

## 2021-11-04 DIAGNOSIS — M542 Cervicalgia: Secondary | ICD-10-CM

## 2021-11-04 DIAGNOSIS — R519 Headache, unspecified: Secondary | ICD-10-CM | POA: Diagnosis not present

## 2021-11-04 DIAGNOSIS — M50322 Other cervical disc degeneration at C5-C6 level: Secondary | ICD-10-CM | POA: Diagnosis not present

## 2021-11-04 DIAGNOSIS — R9389 Abnormal findings on diagnostic imaging of other specified body structures: Secondary | ICD-10-CM | POA: Diagnosis not present

## 2021-11-04 MED ORDER — NORTRIPTYLINE HCL 25 MG PO CAPS: 25.0000 mg | ORAL_CAPSULE | Freq: Every day | ORAL | 1 refills | Status: DC

## 2021-11-04 NOTE — Patient Instructions (Addendum)
Nortriptyline 25 mg at night  Hold gabapetin Continue the daytime pain meds as per PCP MRI of the C spine  Referral to Physical Therapy  Referral to PCP, fool the anemia and the thyroid issues  Follow up in 4 weeks   We have sent a referral to [facility] for your MRI and they will call you directly to schedule your appointment. They are located at [address] [facility]. If you need to contact them directly please call [phone].

## 2021-11-04 NOTE — Progress Notes (Cosign Needed)
NEUROLOGY FOLLOW UP OFFICE NOTE  Jenny Evans [id number]  Assessment/Plan:   Left sided neck and head pain  This is a 51 yo woman with a history of anemia/thalassemia, last Tx , migraines, followed at the headache clinic at [facility] without recent flare, Sickle cell trait, presenting with 1 month history of  acute L sided head and neck pain, extending to the lower edge of the mandible and face. MRI trigeminal protocol on 5/11 was unrevealing, without bony remodeling or marrow edema to suggest TMJ. CT soft tissue neck 3/23 reviewed by me remarkable for mild cervical spine degeneration, including C5-C6 endplate spurring. No acute osseous abnormality identified. Suspect cervicogenic facial pain and less likely migraine or cluster headache or chronic muscle syndrome  Recommend MRI C spine to r/o C disk herniation versus other abnormalities.  Continue meloxicam and  other pain meds as per PCP. She has an appt to establish care soon Start nortriptyline 25 mg nightly if not taking carbamazepine ( she is not sure, she needs to verify). Side effects discussed Referral to PT for neck pain Follow up in 4 weeks or sooner pending on the MRI results    Enlarged thyroid    Independent, prior CT soft tissue neck 3/28 reveals enlarged thyroid with low TSH suggestive of hyperthyroidism.    Follow up with PCP   History of Sickle Cell Trait/Thalassemia/Iron deficiency anemia/ pica ( craves starches and ice) Recommend evaluation with PCP, recommend establish care with hematologist   Subjective:   51 yo woman with a history of anemia/thalassemia, migraines, Sickle cell trait, presenting with 1 month history of L sided head and neck pain, extending to the lower edge of the mandible and face.  Yesterday when the patient went to sleep, and developed dull ache in the neck, radiating upward along the back of the head, only on the left, but then extending to the left forehead and temple, and the area around the eyes  and ears.  No recent viral infection. No dental infection or procedures. No history of TMJ.   She does report some numbness in the area with the flares.  She also developed swelling in the area, and noted neck swelling as well (which work-up included a CT of the neck showing enlarged thyroid which is being followed as an outpatient).  She denies any vision changes.  She reports that this is different to her usual migraines.  She did try Imitrex anyway, without relief.Her flares last about 2 days.   Initially seen at the ED 4/10 where she received Decadron for neck pain and Voltaren gel with no relief. She also tried gabapentin teen "did not help no interest in taking it again".  She tried carbamazepine  as well. SHe is unsure if she is still taking it.. Initially thought to be trigeminal neuralgia vs. Nerve impingement from the spinal canal. MRI if the trigeminal nerve on 5/11 was unrevealing, without bony remodeling to marrow edema to suggest TMJ.  The prior CT soft tissue neck 3/23 for enlarged thyroid did show mild cervical spine degeneration,including C5-C6 endplate spurring. No acute osseous abnormality identified.  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  PAST MEDICAL HISTORY: Past Medical History:  Diagnosis Date   Anemia    Asthma    Chronic pain    GERD (gastroesophageal reflux disease)    Migraine    Peptic ulcer    Sickle cell trait ([facility])    Ulcer  MEDICATIONS: Current Outpatient Medications on File Prior to Visit  Medication Sig Dispense Refill   carbamazepine (CARBATROL) 100 MG 12 hr capsule Take 1 capsule (100 mg total) by mouth daily. 30 capsule 1   ferrous sulfate 325 (65 FE) MG tablet Take 1 tablet (325 mg total) by mouth 2 (two) times daily with a meal. 60 tablet 0   fluticasone (FLONASE) 50 MCG/ACT nasal spray Place 2 sprays into both nostrils daily for 7 days. 16 g 0   lidocaine (LIDODERM) 5 % Place 1 patch onto the skin daily. Remove &  Discard patch within 12 hours or as directed by MD 6 patch 0   meloxicam (MOBIC) 15 MG tablet One tab PO qAM with breakfast for 2 weeks, then daily prn pain. 30 tablet 3   methocarbamol 1000 MG TABS Take 1,000 mg by mouth every 8 (eight) hours as needed for muscle spasms. 15 tablet 0   potassium chloride SA (KLOR-CON M) 20 MEQ tablet Take 1 tablet (20 mEq total) by mouth 2 (two) times daily. 30 tablet 0   SUMAtriptan (IMITREX) 6 MG/0.5ML SOLN injection Inject 0.5 mLs (6 mg total) into the skin every 2 (two) hours as needed for migraine or headache. May repeat in 2 hours if headache persists or recurs. (Max two doses per day) 10 mL 0   [DISCONTINUED] albuterol (PROVENTIL HFA;VENTOLIN HFA) 108 (90 Base) MCG/ACT inhaler Inhale 1-2 puffs into the lungs every 6 (six) hours as needed for wheezing or shortness of breath. (Patient not taking: Reported on 03/24/2019) 1 Inhaler 0   [DISCONTINUED] Cetirizine HCl 10 MG CAPS Take 1 capsule (10 mg total) by mouth daily. 15 capsule 0   [DISCONTINUED] sodium chloride (OCEAN) 0.65 % SOLN nasal spray Place 1 spray into both nostrils as needed for congestion. (Patient not taking: Reported on 03/24/2019) 30 mL 0   No current facility-administered medications on file prior to visit.    ALLERGIES: Allergies  Allergen Reactions   Hydrocodone-Acetaminophen Nausea And Vomiting    Patient can tolerate acetaminophen    FAMILY HISTORY: Family History  Problem Relation Age of Onset   Heart disease Mother        Arrythmia   Kidney disease Mother    Hypertension Mother    COPD Mother    Diabetes Mother    Lung cancer Mother    Heart disease Father    Crohn's disease Maternal Aunt    Breast cancer Maternal Aunt        40's     Objective:   General: No acute distress.  Patient appears well groomed.   Head:  Normocephalic/atraumatic Eyes:  Fundi examined but not visualized. Sclera muddy Neck: supple, no paraspinal tenderness, full range of motion Heart:   Regular rate and rhythm Lungs:  Clear to auscultation bilaterally Back: No paraspinal tenderness Neurological Exam: alert and oriented to person, place, and time. Attention span and concentration intact, recent and remote memory intact, fund of knowledge intact.  Speech fluent and not dysarthric, language intact.  CN II-XII except for significant pain on palpation at the L side of  neck and face, sensation normal.  Sensation to light touch normal, temperature and vibration intact.  Deep tendon reflexes 2+ throughout, toes downgoing.  Finger to nose and heel to shin testing intact.  Gait normal, Romberg negative.    Marlowe Kays 11/04/2021    CC: Clare Gandy, MD,

## 2021-11-05 ENCOUNTER — Emergency Department (HOSPITAL_BASED_OUTPATIENT_CLINIC_OR_DEPARTMENT_OTHER)
Admission: EM | Admit: 2021-11-05 | Discharge: 2021-11-05 | Disposition: A | Discharge status: Home / Self Care | Payer: BLUE CROSS/BLUE SHIELD | Attending: Emergency Medicine | Admitting: Emergency Medicine

## 2021-11-05 ENCOUNTER — Other Ambulatory Visit: Payer: Self-pay

## 2021-11-05 ENCOUNTER — Encounter (HOSPITAL_BASED_OUTPATIENT_CLINIC_OR_DEPARTMENT_OTHER): Payer: Self-pay | Admitting: Pediatrics

## 2021-11-05 DIAGNOSIS — R531 Weakness: Secondary | ICD-10-CM | POA: Diagnosis present

## 2021-11-05 DIAGNOSIS — D508 Other iron deficiency anemias: Secondary | ICD-10-CM | POA: Insufficient documentation

## 2021-11-05 LAB — CBC
HCT: 29.8 % — ABNORMAL LOW (ref 36.0–46.0)
Hemoglobin: 9.5 g/dL — ABNORMAL LOW (ref 12.0–15.0)
MCH: 22.7 pg — ABNORMAL LOW (ref 26.0–34.0)
MCHC: 31.9 g/dL (ref 30.0–36.0)
MCV: 71.3 fL — ABNORMAL LOW (ref 80.0–100.0)
Platelets: 486 10*3/uL — ABNORMAL HIGH (ref 150–400)
RBC: 4.18 MIL/uL (ref 3.87–5.11)
RDW: 21.4 % — ABNORMAL HIGH (ref 11.5–15.5)
WBC: 4.1 10*3/uL (ref 4.0–10.5)
nRBC: 0 % (ref 0.0–0.2)

## 2021-11-05 NOTE — ED Triage Notes (Signed)
Reported was told by OP provider that her iron levels are low

## 2021-11-05 NOTE — ED Provider Notes (Signed)
[facility] EMERGENCY DEPARTMENT Provider Note   CSN: [id number] Arrival date & time: 11/05/21  1222     History Chief Complaint  Patient presents with   Abnormal Lab    Jenny Evans is a 51 y.o. female.  Patient presents after being instructed to come to the ED regarding abnormal blood work. She sees multiple specialists for a variety of reasons but one of her providers obtained blood work and noted that her iron was very low about a month ago. She was instructed to take iron supplementation which she has been doing but is still feeling weak and fatigue, says she feels tired almost all the time. She comes in today because her symptoms are not improving even after the iron supplementation. Denies fever, chills, dysuria, abdominal pain, nausea and vomiting. Endorses pica.   The history is provided by the patient. No language interpreter was used.  Abnormal Lab Result type: chemistry       Home Medications Prior to Admission medications   Medication Sig Start Date End Date Taking? Authorizing Provider  carbamazepine (CARBATROL) 100 MG 12 hr capsule Take 1 capsule (100 mg total) by mouth daily. 10/04/21   Myra Rude, MD  ferrous sulfate 325 (65 FE) MG tablet Take 1 tablet (325 mg total) by mouth 2 (two) times daily with a meal. 09/23/20   Benjiman Core, MD  fluticasone (FLONASE) 50 MCG/ACT nasal spray Place 2 sprays into both nostrils daily for 7 days. 07/30/21 08/29/21  Long, [name], MD  lidocaine (LIDODERM) 5 % Place 1 patch onto the skin daily. Remove & Discard patch within 12 hours or as directed by MD 09/25/21   Pricilla Loveless, MD  meloxicam (MOBIC) 15 MG tablet One tab PO qAM with breakfast for 2 weeks, then daily prn pain. 10/29/21   Myra Rude, MD  methocarbamol 1000 MG TABS Take 1,000 mg by mouth every 8 (eight) hours as needed for muscle spasms. 09/25/21   Pricilla Loveless, MD  nortriptyline (PAMELOR) 25 MG capsule Take 1 capsule (25 mg total) by  mouth at bedtime. 11/04/21   Marcos Eke, PA-C  potassium chloride SA (KLOR-CON M) 20 MEQ tablet Take 1 tablet (20 mEq total) by mouth 2 (two) times daily. 09/10/21   Dione Booze, MD  SUMAtriptan (IMITREX) 6 MG/0.5ML SOLN injection Inject 0.5 mLs (6 mg total) into the skin every 2 (two) hours as needed for migraine or headache. May repeat in 2 hours if headache persists or recurs. (Max two doses per day) 09/06/21   Linwood Dibbles, MD  albuterol (PROVENTIL HFA;VENTOLIN HFA) 108 (90 Base) MCG/ACT inhaler Inhale 1-2 puffs into the lungs every 6 (six) hours as needed for wheezing or shortness of breath. Patient not taking: Reported on 03/24/2019 04/08/18 09/22/19  Long, [name], MD  Cetirizine HCl 10 MG CAPS Take 1 capsule (10 mg total) by mouth daily. 09/22/19 01/23/20  Wieters, Hallie C, PA-C  sodium chloride (OCEAN) 0.65 % SOLN nasal spray Place 1 spray into both nostrils as needed for congestion. Patient not taking: Reported on 03/24/2019 12/14/18 09/22/19  Leretha Dykes, PA-C      Allergies    Hydrocodone-acetaminophen    Review of Systems   Review of Systems  Constitutional:  Positive for activity change. Negative for appetite change, chills and fever.  HENT:  Negative for congestion.   Eyes:  Negative for visual disturbance.  Respiratory:  Negative for cough, shortness of breath and wheezing.   Cardiovascular:  Negative  for chest pain and leg swelling.  Gastrointestinal:  Negative for abdominal distention, abdominal pain, blood in stool, constipation, nausea and vomiting.  Genitourinary:  Negative for dysuria.  Musculoskeletal:  Positive for neck pain.  Neurological:  Positive for weakness (generalized) and headaches. Negative for speech difficulty.  Psychiatric/Behavioral:  Negative for confusion.    Physical Exam Updated Vital Signs BP 117/75 (BP Location: Left Arm)   Pulse 81   Temp 98.2 F (36.8 C) (Oral)   Resp 18   Ht 5\' 4"  (1.626 m)   Wt 63 kg   SpO2 100%   BMI 23.86 kg/m   Physical Exam Vitals reviewed.  Constitutional:      General: She is not in acute distress.    Appearance: Normal appearance.  HENT:     Head: Normocephalic and atraumatic.     Right Ear: External ear normal.     Left Ear: External ear normal.     Nose: Nose normal. No congestion or rhinorrhea.     Mouth/Throat:     Mouth: Mucous membranes are moist.     Pharynx: Oropharynx is clear. No oropharyngeal exudate or posterior oropharyngeal erythema.  Eyes:     General: No scleral icterus.       Right eye: No discharge.        Left eye: No discharge.     Extraocular Movements: Extraocular movements intact.     Pupils: Pupils are equal, round, and reactive to light.     Comments: Presence of conjunctival pallor   Cardiovascular:     Rate and Rhythm: Normal rate and regular rhythm.     Pulses: Normal pulses.     Heart sounds: Normal heart sounds. No murmur heard.   No gallop.  Pulmonary:     Effort: Pulmonary effort is normal. No respiratory distress.     Breath sounds: Normal breath sounds. No wheezing or rales.  Abdominal:     General: Abdomen is flat. Bowel sounds are normal. There is no distension.     Palpations: Abdomen is soft. There is no mass.     Tenderness: There is no abdominal tenderness. There is no right CVA tenderness or left CVA tenderness.  Musculoskeletal:        General: No deformity or signs of injury.     Cervical back: Neck supple. No tenderness.     Right lower leg: No edema.     Left lower leg: No edema.  Lymphadenopathy:     Cervical: No cervical adenopathy.  Skin:    General: Skin is warm and dry.     Capillary Refill: Capillary refill takes 2 to 3 seconds.     Findings: No erythema.  Neurological:     General: No focal deficit present.     Mental Status: She is alert and oriented to person, place, and time.     Cranial Nerves: No cranial nerve deficit.     Sensory: No sensory deficit.     Motor: No weakness.  Psychiatric:        Mood and  Affect: Mood normal.        Behavior: Behavior normal.    ED Results / Procedures / Treatments   Labs (all labs ordered are listed, but only abnormal results are displayed) Labs Reviewed  CBC - Abnormal; Notable for the following components:      Result Value   Hemoglobin 9.5 (*)    HCT 29.8 (*)    MCV 71.3 (*)    [facility]  22.7 (*)    RDW 21.4 (*)    Platelets 486 (*)    All other components within normal limits    EKG None  Radiology No results found.  Procedures Procedures  None  Medications Ordered in ED Medications - No data to display  ED Course/ Medical Decision Making/ A&P                           Medical Decision Making Amount and/or Complexity of Data Reviewed Labs: ordered.     Patient with history of chronic anemia presents after blood work obtained a month ago without noted improvement after oral iron supplementation. Exam consistent with mild signs of anemia, symptoms are likely chronic. Prior blood work last month notable for Hgb 8.5 with iron 22 and ferritin 36. Will repeat CBC to get updated result, CBC demonstrated Hgb 9.5 Baseline Hgb appears to be around 7.5-8.5, today's hemoglobin is improved from this range. No known source of active bleeding. Patient likely with chronic symptoms, no urgent need for blood transfusion or iron infusion at this time.  Instructed to continue to take oral iron supplementation and follow up with PCP for repeat CBC and monitoring of anemia. Patient may benefit from seeing a hematologist outpatient. Patient discharged home in medically stable condition and agreeable to plan in place.    Final Clinical Impression(s) / ED Diagnoses Final diagnoses:  Other iron deficiency anemia    Rx / DC Orders ED Discharge Orders     None         Reece Leader, DO 11/05/21 1343    Milagros Loll, MD 11/06/21 [phone]

## 2021-11-05 NOTE — Discharge Instructions (Addendum)
You came to the emergency department due to your iron, your hemoglobin was actually improved from your normal baseline hemoglobin. You do not require  IV iron at this time. Please continue to take your iron supplement daily. Please follow up with your primary doctor for repeat blood work.

## 2021-11-05 NOTE — ED Notes (Signed)
Pt states that she was supposed to come to Dr. Raeford Razor for a labs only appointment.  Called lab to see if they had orders already for this which they do not.  Attempted to call Dr. Raeford Razor office to see which labs he wanted, unable to reach them.  Additional tubes drawn and brought to lab

## 2021-11-19 ENCOUNTER — Ambulatory Visit
Admission: RE | Admit: 2021-11-19 | Discharge: 2021-11-19 | Disposition: A | Discharge status: Home / Self Care | Payer: PRIVATE HEALTH INSURANCE | Source: Ambulatory Visit | Attending: Physician Assistant | Admitting: Physician Assistant

## 2021-11-20 ENCOUNTER — Encounter: Payer: Self-pay | Admitting: Nurse Practitioner

## 2021-11-20 ENCOUNTER — Telehealth: Payer: Self-pay | Admitting: Nurse Practitioner

## 2021-11-20 ENCOUNTER — Ambulatory Visit: Payer: PRIVATE HEALTH INSURANCE | Attending: Nurse Practitioner | Admitting: Nurse Practitioner

## 2021-11-20 ENCOUNTER — Telehealth: Payer: Self-pay

## 2021-11-20 VITALS — BP 114/80 | HR 86 | Temp 98.1°F | Resp 16 | Ht 65.25 in | Wt 139.0 lb

## 2021-11-20 DIAGNOSIS — E871 Hypo-osmolality and hyponatremia: Secondary | ICD-10-CM

## 2021-11-20 DIAGNOSIS — M542 Cervicalgia: Secondary | ICD-10-CM | POA: Diagnosis not present

## 2021-11-20 DIAGNOSIS — Z30013 Encounter for initial prescription of injectable contraceptive: Secondary | ICD-10-CM

## 2021-11-20 DIAGNOSIS — K219 Gastro-esophageal reflux disease without esophagitis: Secondary | ICD-10-CM

## 2021-11-20 DIAGNOSIS — R7989 Other specified abnormal findings of blood chemistry: Secondary | ICD-10-CM | POA: Diagnosis not present

## 2021-11-20 DIAGNOSIS — D509 Iron deficiency anemia, unspecified: Secondary | ICD-10-CM

## 2021-11-20 DIAGNOSIS — D573 Sickle-cell trait: Secondary | ICD-10-CM

## 2021-11-20 DIAGNOSIS — F5101 Primary insomnia: Secondary | ICD-10-CM

## 2021-11-20 DIAGNOSIS — Z1159 Encounter for screening for other viral diseases: Secondary | ICD-10-CM

## 2021-11-20 DIAGNOSIS — Z862 Personal history of diseases of the blood and blood-forming organs and certain disorders involving the immune mechanism: Secondary | ICD-10-CM

## 2021-11-20 DIAGNOSIS — Z1231 Encounter for screening mammogram for malignant neoplasm of breast: Secondary | ICD-10-CM

## 2021-11-20 DIAGNOSIS — Z7689 Persons encountering health services in other specified circumstances: Secondary | ICD-10-CM | POA: Diagnosis not present

## 2021-11-20 DIAGNOSIS — G8929 Other chronic pain: Secondary | ICD-10-CM

## 2021-11-20 DIAGNOSIS — Z3202 Encounter for pregnancy test, result negative: Secondary | ICD-10-CM | POA: Diagnosis not present

## 2021-11-20 DIAGNOSIS — Z1211 Encounter for screening for malignant neoplasm of colon: Secondary | ICD-10-CM

## 2021-11-20 LAB — POCT URINE PREGNANCY: Preg Test, Ur: NEGATIVE

## 2021-11-20 MED ORDER — TRAZODONE HCL 150 MG PO TABS: 150.0000 mg | ORAL_TABLET | Freq: Every day | ORAL | 0 refills | Status: DC

## 2021-11-20 MED ORDER — MEDROXYPROGESTERONE ACETATE 150 MG/ML IM SUSP
150.0000 mg | Freq: Once | INTRAMUSCULAR | Status: AC
  Administered 2021-11-20: 150 mg via INTRAMUSCULAR

## 2021-11-20 MED ORDER — PANTOPRAZOLE SODIUM 40 MG PO TBEC: 40.0000 mg | DELAYED_RELEASE_TABLET | Freq: Every day | ORAL | 3 refills | Status: DC

## 2021-11-20 MED ORDER — PREGABALIN 75 MG PO CAPS: 75.0000 mg | ORAL_CAPSULE | Freq: Two times a day (BID) | ORAL | 0 refills | Status: DC

## 2021-11-20 NOTE — Progress Notes (Signed)
Assessment & Plan:  Jenny Evans was seen today for insomnia, thyroid problem and establish care.  Diagnoses and all orders for this visit:  Encounter to establish care  Chronic neck pain -     pregabalin (LYRICA) 75 MG capsule; Take 1 capsule (75 mg total) by mouth 2 (two) times daily. If ineffective will need to follow up with neurology   Primary insomnia -     traZODone (DESYREL) 150 MG tablet; Take 1 tablet (150 mg total) by mouth at bedtime.  Abnormal thyroid blood test -     Thyroid Panel With TSH  Encounter for initial prescription of injectable contraceptive -     medroxyPROGESTERone (DEPO-PROVERA) injection 150 mg -     POCT urine pregnancy  Colon cancer screening -     Ambulatory referral to Gastroenterology  Hyponatremia -     Basic metabolic panel  Need for hepatitis C screening test -     HCV Ab w Reflex to Quant PCR  Breast cancer screening by mammogram -     MM 3D SCREEN BREAST BILATERAL; Future -     Ambulatory referral to Gastroenterology  Sickle cell trait (HCC) -     Ambulatory referral to Hematology / Oncology  GERD without esophagitis -     pantoprazole (PROTONIX) 40 MG tablet; Take 1 tablet (40 mg total) by mouth daily.  History of thalassemia -     Ambulatory referral to Hematology / Oncology  Iron deficiency anemia, unspecified iron deficiency anemia type -     Ambulatory referral to Hematology / Oncology    Patient has been counseled on age-appropriate routine health concerns for screening and prevention. These are reviewed and up-to-date. Referrals have been placed accordingly. Immunizations are up-to-date or declined.    Subjective:   Chief Complaint  Patient presents with   Insomnia   Thyroid Problem   Establish Care   HPI Jenny Evans 51 y.o. female presents to office today to establish care. She is accompanied by her significant other.  She has a past medical history of IDA/thalassemia Asthma, Chronic pain, GERD, Migraine,  Peptic ulcer, Sickle cell trait, and Ulcer.    She has left sided neck pain which has been ongoing for several months. Pain radiates to her left ear and head and sometimes it feels like her face is swollen as well. There is never any facial droop or dysarthria. She also has intractable migraines for which she is being evaluated by neuro as well.  Per neuro notes on 11-04-2021 MRI trigeminal protocol on 5/11 was unrevealing, without bony remodeling or marrow edema to suggest TMJ. CT soft tissue neck 3/23 reviewed by me remarkable for mild cervical spine degeneration, including C5-C6 endplate spurring. No acute osseous abnormality identified. Suspect cervicogenic facial pain and less likely migraine or cluster headache or chronic muscle syndrome Plan is MRI C spine to r/o disk herniation, referral for PT Medications triad and failed: meloxicam Voltaren gel Methocarbamol Gabapentin Voltaren gel Naproxen Ibuprofen Percocet Nortriptyline Pregabalin Imitrex injections There was an incidental finding of enlarged thyroid on CT. TSH levels have been fluctuating between low and normal. Will order full panel today.  Lab Results  Component Value Date   TSH 0.329 (L) 10/04/2021    Anemia She has a reported history of thalassemia, IDA, and sickle cell trait. Eating lots of ice and excessive amounts of corn starch. Menstrual cycles last 5 days but are normal in flow with heaviest day the first day of her cycle.  She is requesting to start Depo today.   Insomnia  Has difficulty falling asleep and staying asleep. Averaging a few hours of sleep at a time. Denies any excessive use of caffeine.       Incidental finding on CT 3-28 showed enlarged thyroid.    Review of Systems  Constitutional:  Negative for fever, malaise/fatigue and weight loss.  HENT: Negative.  Negative for nosebleeds.   Eyes: Negative.  Negative for blurred vision, double vision and photophobia.  Respiratory: Negative.   Negative for cough and shortness of breath.   Cardiovascular: Negative.  Negative for chest pain, palpitations and leg swelling.  Gastrointestinal:  Positive for constipation and heartburn. Negative for nausea and vomiting.  Musculoskeletal:  Positive for neck pain. Negative for myalgias.  Neurological:  Positive for headaches. Negative for dizziness, focal weakness and seizures.  Psychiatric/Behavioral:  Negative for suicidal ideas. The patient has insomnia.    Past Medical History:  Diagnosis Date   Anemia    Asthma    Chronic pain    GERD (gastroesophageal reflux disease)    Migraine    Peptic ulcer    Sickle cell trait ([place])    Ulcer     Past Surgical History:  Procedure Laterality Date   ESOPHAGOGASTRODUODENOSCOPY (EGD) WITH PROPOFOL N/A 04/04/2013   Procedure: ESOPHAGOGASTRODUODENOSCOPY (EGD) WITH PROPOFOL;  Surgeon: Arta Silence, MD;  Location: [facility];  Service: Endoscopy;  Laterality: N/A;   EXAMINATION UNDER ANESTHESIA N/A 08/10/2013   Procedure: RECTAL EXAM UNDER ANESTHESIA;  Surgeon: Joyice Faster. Cornett, MD;  Location: [facility];  Service: General;  Laterality: N/A;  ligation   HEMORRHOID SURGERY     MYRINGOTOMY     age 64    Family History  Problem Relation Age of Onset   Heart disease Mother        Arrythmia   Kidney disease Mother    Hypertension Mother    COPD Mother    Diabetes Mother    Lung cancer Mother    Heart disease Father    Crohn's disease Maternal Aunt    Breast cancer Maternal Aunt        40's    Social History Reviewed with no changes to be made today.   Outpatient Medications Prior to Visit  Medication Sig Dispense Refill   meloxicam (MOBIC) 15 MG tablet One tab PO qAM with breakfast for 2 weeks, then daily prn pain. 30 tablet 3   nortriptyline (PAMELOR) 25 MG capsule Take 1 capsule (25 mg total) by mouth at bedtime. 30 capsule 1   carbamazepine (CARBATROL) 100 MG 12 hr capsule Take 1 capsule (100 mg total) by mouth  daily. (Patient not taking: Reported on 11/20/2021) 30 capsule 1   ferrous sulfate 325 (65 FE) MG tablet Take 1 tablet (325 mg total) by mouth 2 (two) times daily with a meal. (Patient not taking: Reported on 11/20/2021) 60 tablet 0   fluticasone (FLONASE) 50 MCG/ACT nasal spray Place 2 sprays into both nostrils daily for 7 days. 16 g 0   potassium chloride SA (KLOR-CON M) 20 MEQ tablet Take 1 tablet (20 mEq total) by mouth 2 (two) times daily. (Patient not taking: Reported on 11/20/2021) 30 tablet 0   lidocaine (LIDODERM) 5 % Place 1 patch onto the skin daily. Remove & Discard patch within 12 hours or as directed by MD (Patient not taking: Reported on 11/20/2021) 6 patch 0   methocarbamol 1000 MG TABS Take 1,000 mg by mouth every 8 (eight)  hours as needed for muscle spasms. (Patient not taking: Reported on 11/20/2021) 15 tablet 0   SUMAtriptan (IMITREX) 6 MG/0.5ML SOLN injection Inject 0.5 mLs (6 mg total) into the skin every 2 (two) hours as needed for migraine or headache. May repeat in 2 hours if headache persists or recurs. (Max two doses per day) (Patient not taking: Reported on 11/20/2021) 10 mL 0   No facility-administered medications prior to visit.    Allergies  Allergen Reactions   Hydrocodone-Acetaminophen Nausea And Vomiting    Patient can tolerate acetaminophen       Objective:    BP 114/80   Pulse 86   Temp 98.1 F (36.7 C) (Oral)   Resp 16   Ht 5' 5.25" (1.657 m)   Wt 139 lb (63 kg)   LMP 10/21/2021   SpO2 100%   BMI 22.95 kg/m  Wt Readings from Last 3 Encounters:  11/20/21 139 lb (63 kg)  11/05/21 139 lb (63 kg)  11/04/21 139 lb (63 kg)    Physical Exam Vitals and nursing note reviewed.  Constitutional:      Appearance: She is well-developed.  HENT:     Head: Normocephalic and atraumatic.  Cardiovascular:     Rate and Rhythm: Normal rate and regular rhythm.     Heart sounds: Normal heart sounds. No murmur heard.   No friction rub. No gallop.  Pulmonary:      Effort: Pulmonary effort is normal. No tachypnea or respiratory distress.     Breath sounds: Normal breath sounds. No decreased breath sounds, wheezing, rhonchi or rales.  Chest:     Chest wall: No tenderness.  Abdominal:     General: Bowel sounds are normal.     Palpations: Abdomen is soft.  Musculoskeletal:        General: Normal range of motion.     Cervical back: Normal range of motion.  Skin:    General: Skin is warm and dry.  Neurological:     Mental Status: She is alert and oriented to person, place, and time.     Coordination: Coordination normal.  Psychiatric:        Behavior: Behavior normal. Behavior is cooperative.        Thought Content: Thought content normal.        Judgment: Judgment normal.         Patient has been counseled extensively about nutrition and exercise as well as the importance of adherence with medications and regular follow-up. The patient was given clear instructions to go to ER or return to medical center if symptoms don't improve, worsen or new problems develop. The patient verbalized understanding.   Follow-up: Return in about 3 months (around 02/20/2022).   Gildardo Pounds, FNP-BC [facility] and [facility] [place], [place]   11/20/2021, 7:50 PM

## 2021-11-20 NOTE — Progress Notes (Signed)
Establish care  -concerns with thyroid and insomina  Neck pain- seeing 2 specialist for neck pain  Sports med and neuro  Discuss method for birth control- Depo shot

## 2021-11-20 NOTE — Telephone Encounter (Signed)
PA for protonix approved until 11/19/22

## 2021-11-20 NOTE — Telephone Encounter (Signed)
Please contact pt about pain meds, prescribed something previously that didn't work.

## 2021-11-21 ENCOUNTER — Other Ambulatory Visit: Payer: Self-pay | Admitting: Nurse Practitioner

## 2021-11-21 DIAGNOSIS — D509 Iron deficiency anemia, unspecified: Secondary | ICD-10-CM

## 2021-11-21 LAB — HCV AB W REFLEX TO QUANT PCR: HCV Ab: NONREACTIVE

## 2021-11-21 LAB — THYROID PANEL WITH TSH
Free Thyroxine Index: 1.6 (ref 1.2–4.9)
T3 Uptake Ratio: 23 % — ABNORMAL LOW (ref 24–39)
T4, Total: 6.9 ug/dL (ref 4.5–12.0)
TSH: 0.506 u[IU]/mL (ref 0.450–4.500)

## 2021-11-21 LAB — BASIC METABOLIC PANEL
BUN/Creatinine Ratio: 11 (ref 9–23)
BUN: 7 mg/dL (ref 6–24)
CO2: 20 mmol/L (ref 20–29)
Calcium: 9.7 mg/dL (ref 8.7–10.2)
Chloride: 101 mmol/L (ref 96–106)
Creatinine, Ser: 0.62 mg/dL (ref 0.57–1.00)
Glucose: 88 mg/dL (ref 70–99)
Potassium: 4.2 mmol/L (ref 3.5–5.2)
Sodium: 138 mmol/L (ref 134–144)
eGFR: 110 mL/min/{1.73_m2} (ref >59)

## 2021-11-21 LAB — HCV INTERPRETATION

## 2021-11-21 NOTE — Therapy (Incomplete)
OUTPATIENT PHYSICAL THERAPY EVALUATION   Patient Name: Jenny Evans MRN: [id number] DOB:1970/06/29, 51 y.o., female Today's Date: 11/21/2021    Past Medical History:  Diagnosis Date   Anemia    Asthma    Chronic pain    GERD (gastroesophageal reflux disease)    Migraine    Peptic ulcer    Sickle cell trait (HCC)    Ulcer    Past Surgical History:  Procedure Laterality Date   ESOPHAGOGASTRODUODENOSCOPY (EGD) WITH PROPOFOL N/A 04/04/2013   Procedure: ESOPHAGOGASTRODUODENOSCOPY (EGD) WITH PROPOFOL;  Surgeon: Willis Modena, MD;  Location: [facility];  Service: Endoscopy;  Laterality: N/A;   EXAMINATION UNDER ANESTHESIA N/A 08/10/2013   Procedure: RECTAL EXAM UNDER ANESTHESIA;  Surgeon: Clovis Pu. Cornett, MD;  Location: [facility];  Service: General;  Laterality: N/A;  ligation   HEMORRHOID SURGERY     MYRINGOTOMY     age 22   Patient Active Problem List   Diagnosis Date Noted   Trigeminal neuralgia of left side of face 10/04/2021   Enlarged thyroid 10/04/2021   Sickle cell trait (HCC) 06/14/2017   Microcytic anemia 06/14/2017   Blood loss anemia 11/12/2015   Melena 11/12/2015   Nonspecific abnormal electrocardiogram (ECG) (EKG) 04/03/2015   Palpitations 04/03/2015   Neck pain 04/03/2015   Colitis 07/10/2014   Absolute anemia    Post-operative state 08/26/2013   Rectocele 08/26/2013   Anal or rectal pain 07/29/2013   Headache(784.0) 02/27/2012   ACUTE BRONCHITIS 03/25/2010   OBESITY 09/06/2009   INSOMNIA 01/23/2009   HYPOKALEMIA 11/01/2008   DYSPEPSIA&OTHER SPEC DISORDERS FUNCTION STOMACH 06/07/2008   ALLERGIC RHINITIS 03/23/2008   CONSTIPATION 01/20/2008   Shortness of breath 08/27/2007   TOBACCO ABUSE 03/09/2007   Migraine 03/09/2007   ABSCESS, TOOTH 03/09/2007   GERD (gastroesophageal reflux disease) 03/09/2007   Migraine variant 02/25/2007   ECZEMA 02/25/2007    PCP: Claiborne Rigg, NP  REFERRING PROVIDER: Marcos Eke,  PA-C  REFERRING DIAG: Abnormal CT scan, neck  THERAPY DIAG:  No diagnosis found.  Rationale for Evaluation and Treatment Rehabilitation  ONSET DATE: ***   SUBJECTIVE:      SUBJECTIVE STATEMENT: ***  PERTINENT HISTORY:  ***  PAIN:  Are you having pain? Yes:  NPRS scale: ***/10 Pain location: *** Pain description: *** Aggravating factors: *** Relieving factors: ***  PRECAUTIONS: None  WEIGHT BEARING RESTRICTIONS No  FALLS:  Has patient fallen in last 6 months? No  LIVING ENVIRONMENT: Lives with: {[facility] lives with:25569::"lives with their family"} Lives in: {Lives in:25570} Stairs: {opstairs:27293} Has following equipment at home: {Assistive devices:23999}  OCCUPATION: ***  PLOF: Independent  PATIENT GOALS: Pain relief and ***   OBJECTIVE:  DIAGNOSTIC FINDINGS:  ***  PATIENT SURVEYS:  FOTO ***  COGNITION: Overall cognitive status: Within functional limits for tasks assessed  SENSATION: {sensation:27233}  POSTURE: {posture:25561}   PALPATION: ***   CERVICAL ROM:   {AROM/PROM:27142} ROM A/PROM (deg) eval  Flexion   Extension   Right lateral flexion   Left lateral flexion   Right rotation   Left rotation    (Blank rows = not tested)  UPPER EXTREMITY ROM:  {AROM/PROM:27142} ROM Right eval Left eval  Shoulder flexion    Shoulder extension    Shoulder abduction    Shoulder adduction    Shoulder extension    Shoulder internal rotation    Shoulder external rotation    Elbow flexion    Elbow extension    Wrist flexion    Wrist  extension    Wrist ulnar deviation    Wrist radial deviation    Wrist pronation    Wrist supination     (Blank rows = not tested)  UPPER EXTREMITY MMT:  MMT Right eval Left eval  Shoulder flexion    Shoulder extension    Shoulder abduction    Shoulder adduction    Shoulder extension    Shoulder internal rotation    Shoulder external rotation    Middle trapezius    Lower trapezius    Elbow  flexion    Elbow extension    Wrist flexion    Wrist extension    Wrist ulnar deviation    Wrist radial deviation    Wrist pronation    Wrist supination    Grip strength     (Blank rows = not tested)  CERVICAL SPECIAL TESTS:  {Cervical special tests:25246}  FUNCTIONAL TESTS:  {Functional tests:24029}   TODAY'S TREATMENT:  ***   PATIENT EDUCATION:  Education details: Exam findings, POC, HEP Person educated: Patient Education method: Programmer, multimedia, Demonstration, Tactile cues, Verbal cues, and Handouts Education comprehension: verbalized understanding, returned demonstration, verbal cues required, tactile cues required, and needs further education  HOME EXERCISE PROGRAM: ***   ASSESSMENT: CLINICAL IMPRESSION: Patient is a 51 y.o. female who was seen today for physical therapy evaluation and treatment for ***.    OBJECTIVE IMPAIRMENTS {opptimpairments:25111}.   ACTIVITY LIMITATIONS {activitylimitations:27494}  PARTICIPATION LIMITATIONS: {participationrestrictions:25113}  PERSONAL FACTORS {Personal factors:25162} are also affecting patient's functional outcome.   REHAB POTENTIAL: {rehabpotential:25112}  CLINICAL DECISION MAKING: {clinical decision making:25114}  EVALUATION COMPLEXITY: {Evaluation complexity:25115}   GOALS: Goals reviewed with patient? Yes  SHORT TERM GOALS: Target date: {follow up:25551}   Patient will be I with initial HEP in order to progress with therapy. Baseline: HEP provided at eval Goal status: INITIAL  2.  PT will review FOTO with patient by 3rd visit in order to understand expected progress and outcome with therapy. Baseline: FOTO assessed at eval Goal status: INITIAL  3.  *** Baseline: *** Goal status: INITIAL  LONG TERM GOALS: Target date: {follow up:25551}  Patient will be I with final HEP to maintain progress from PT. Baseline: HEP provided at eval Goal status: INITIAL  2.  Patient will report >/= ***% status on FOTO  to indicate improved functional ability. Baseline: ***% functional status Goal status: INITIAL  3.  *** Baseline: *** Goal status: INITIAL  4.  *** Baseline: *** Goal status: INITIAL   PLAN: PT FREQUENCY: {rehab frequency:25116}  PT DURATION: {rehab duration:25117}  PLANNED INTERVENTIONS: {rehab planned interventions:25118::"Therapeutic exercises","Therapeutic activity","Neuromuscular re-education","Balance training","Gait training","Patient/Family education","Joint mobilization"}  PLAN FOR NEXT SESSION: Review HEP and progress PRN, ***   Rosana Hoes, PT, DPT, LAT, ATC 11/21/21  10:35 AM Phone: [phone] Fax: [phone]

## 2021-11-22 ENCOUNTER — Other Ambulatory Visit: Payer: Self-pay

## 2021-11-22 ENCOUNTER — Inpatient Hospital Stay: Payer: BLUE CROSS/BLUE SHIELD | Attending: Hematology & Oncology

## 2021-11-22 ENCOUNTER — Encounter: Payer: Self-pay | Admitting: Family

## 2021-11-22 ENCOUNTER — Other Ambulatory Visit: Payer: Self-pay | Admitting: Family

## 2021-11-22 ENCOUNTER — Inpatient Hospital Stay (HOSPITAL_BASED_OUTPATIENT_CLINIC_OR_DEPARTMENT_OTHER): Payer: BLUE CROSS/BLUE SHIELD | Admitting: Family

## 2021-11-22 ENCOUNTER — Other Ambulatory Visit: Payer: Self-pay | Admitting: Lab

## 2021-11-22 VITALS — BP 89/56 | HR 66 | Temp 98.0°F | Resp 18 | Ht 65.0 in | Wt 140.1 lb

## 2021-11-22 DIAGNOSIS — Z79899 Other long term (current) drug therapy: Secondary | ICD-10-CM | POA: Insufficient documentation

## 2021-11-22 DIAGNOSIS — D573 Sickle-cell trait: Secondary | ICD-10-CM

## 2021-11-22 DIAGNOSIS — F17210 Nicotine dependence, cigarettes, uncomplicated: Secondary | ICD-10-CM | POA: Insufficient documentation

## 2021-11-22 DIAGNOSIS — D649 Anemia, unspecified: Secondary | ICD-10-CM

## 2021-11-22 DIAGNOSIS — D509 Iron deficiency anemia, unspecified: Secondary | ICD-10-CM | POA: Insufficient documentation

## 2021-11-22 LAB — IRON AND IRON BINDING CAPACITY (CC-WL,HP ONLY)
Iron: 6 ug/dL — ABNORMAL LOW (ref 28–170)
Saturation Ratios: 1 % — ABNORMAL LOW (ref 10.4–31.8)
TIBC: 498 ug/dL — ABNORMAL HIGH (ref 250–450)
UIBC: 492 ug/dL — ABNORMAL HIGH (ref 148–442)

## 2021-11-22 LAB — CBC WITH DIFFERENTIAL (CANCER CENTER ONLY)
Abs Immature Granulocytes: 0.01 10*3/uL (ref 0.00–0.07)
Basophils Absolute: 0 10*3/uL (ref 0.0–0.1)
Basophils Relative: 1 %
Eosinophils Absolute: 0.2 10*3/uL (ref 0.0–0.5)
Eosinophils Relative: 4 %
HCT: 27.1 % — ABNORMAL LOW (ref 36.0–46.0)
Hemoglobin: 8.6 g/dL — ABNORMAL LOW (ref 12.0–15.0)
Immature Granulocytes: 0 %
Lymphocytes Relative: 23 %
Lymphs Abs: 1.5 10*3/uL (ref 0.7–4.0)
MCH: 22.8 pg — ABNORMAL LOW (ref 26.0–34.0)
MCHC: 31.7 g/dL (ref 30.0–36.0)
MCV: 71.9 fL — ABNORMAL LOW (ref 80.0–100.0)
Monocytes Absolute: 0.5 10*3/uL (ref 0.1–1.0)
Monocytes Relative: 8 %
Neutro Abs: 4.1 10*3/uL (ref 1.7–7.7)
Neutrophils Relative %: 64 %
Platelet Count: 709 10*3/uL — ABNORMAL HIGH (ref 150–400)
RBC: 3.77 MIL/uL — ABNORMAL LOW (ref 3.87–5.11)
RDW: 20.5 % — ABNORMAL HIGH (ref 11.5–15.5)
WBC Count: 6.5 10*3/uL (ref 4.0–10.5)
nRBC: 0 % (ref 0.0–0.2)

## 2021-11-22 LAB — CMP (CANCER CENTER ONLY)
ALT: 11 U/L (ref 0–44)
AST: 16 U/L (ref 15–41)
Albumin: 4.4 g/dL (ref 3.5–5.0)
Alkaline Phosphatase: 68 U/L (ref 38–126)
Anion gap: 9 (ref 5–15)
BUN: 11 mg/dL (ref 6–20)
CO2: 22 mmol/L (ref 22–32)
Calcium: 9.3 mg/dL (ref 8.9–10.3)
Chloride: 105 mmol/L (ref 98–111)
Creatinine: 0.77 mg/dL (ref 0.44–1.00)
GFR, Estimated: >60 mL/min (ref >60)
Glucose, Bld: 82 mg/dL (ref 70–99)
Potassium: 3.3 mmol/L — ABNORMAL LOW (ref 3.5–5.1)
Sodium: 136 mmol/L (ref 135–145)
Total Bilirubin: 0.2 mg/dL — ABNORMAL LOW (ref 0.3–1.2)
Total Protein: 7 g/dL (ref 6.5–8.1)

## 2021-11-22 LAB — RETICULOCYTES
Immature Retic Fract: 25.5 % — ABNORMAL HIGH (ref 2.3–15.9)
RBC.: 3.76 MIL/uL — ABNORMAL LOW (ref 3.87–5.11)
Retic Count, Absolute: 49.3 10*3/uL (ref 19.0–186.0)
Retic Ct Pct: 1.3 % (ref 0.4–3.1)

## 2021-11-22 LAB — FERRITIN: Ferritin: 5 ng/mL — ABNORMAL LOW (ref 11–307)

## 2021-11-22 MED ORDER — FOLIC ACID 1 MG PO TABS: 1.0000 mg | ORAL_TABLET | Freq: Every day | ORAL | 11 refills | Status: DC

## 2021-11-22 MED ORDER — FOLIC ACID 1 MG PO TABS: 1.0000 mg | ORAL_TABLET | Freq: Every day | ORAL | 11 refills | Status: AC

## 2021-11-22 NOTE — Progress Notes (Signed)
Hematology/Oncology Consultation   Name: Jenny Evans      MRN: [id number]    Location: Room/bed info not found  Date: 11/22/2021 Time:9:02 AM   REFERRING PHYSICIAN: Geryl Rankins, NP  REASON FOR CONSULT:  Sickle cell trait, history of thalassemia and iron deficiency anemia    DIAGNOSIS:  Sickle cell trait Thalassemia Iron deficiency anemia  HISTORY OF PRESENT ILLNESS: Jenny Evans is a very pleasant 51 yo African American female with long history of anemia.  She states that her mother also has history of anemia.  They both have failed oral iron and have received IV iron in the past. The patient states that it has been several years since she last had an infusion.  Her cycle is regular with heavy flow the first day. No other blood loss noted.  No bruising or petechiae.  She is symptomatic with fatigue, weakness, chewing lots of ice, eating corn starch, occasional dizziness and palpitations.  Her sickle cell screen in 2018 was positive.  No history of pregnancy or miscarriage.  She is due for mammogram in July.  EGD in 2014 with Dr. Paulita Fujita showed esophagitis and gastric ulcers. She is on Protonix daily.   No personal history of cancer. Mother had lung, niece had lymphoma at 34 yo, paternal aunt had brain, paternal uncle had lung and maternal aunt had breast.  She has been having pain in her left neck that radiates into her left ear and face.  MRI of the neck showed C5-6 mild broad based disc bulge,  mild bilateral foraminal stenosis, C4-5 mild bilateral foraminal narrowing. MRI of face showed no specific cause for symptoms.  No fever, chills, n/v, cough, rash, SOB, chest pain, abdominal pain or changes in bowel or bladder habits at this time.  She takes stool softeners regularly to help prevent constipation.  No swelling, tenderness, numbness or tingling in her extremities at this time. She states that since childhood she will have random episodes where her arms and legs go numb. She has  not had one of these episodes in 3 years.  She has not had much of an appetite. She is staying well hydrated. Her weight is 140 lbs.  She smokes cigarettes some throughout the week but mostly vapes. No recreational drug use or ETOH.  She work 6 days a week at a [employer].   ROS: All other 10 point review of systems is negative.   PAST MEDICAL HISTORY:   Past Medical History:  Diagnosis Date   Anemia    Asthma    Chronic pain    GERD (gastroesophageal reflux disease)    Migraine    Peptic ulcer    Sickle cell trait (HCC)    Ulcer     ALLERGIES: Allergies  Allergen Reactions   Hydrocodone-Acetaminophen Nausea And Vomiting    Patient can tolerate acetaminophen      MEDICATIONS:  Current Outpatient Medications on File Prior to Visit  Medication Sig Dispense Refill   carbamazepine (CARBATROL) 100 MG 12 hr capsule Take 1 capsule (100 mg total) by mouth daily. (Patient not taking: Reported on 11/20/2021) 30 capsule 1   ferrous sulfate 325 (65 FE) MG tablet Take 1 tablet (325 mg total) by mouth 2 (two) times daily with a meal. (Patient not taking: Reported on 11/20/2021) 60 tablet 0   fluticasone (FLONASE) 50 MCG/ACT nasal spray Place 2 sprays into both nostrils daily for 7 days. 16 g 0   meloxicam (MOBIC) 15 MG tablet One tab  PO qAM with breakfast for 2 weeks, then daily prn pain. 30 tablet 3   nortriptyline (PAMELOR) 25 MG capsule Take 1 capsule (25 mg total) by mouth at bedtime. 30 capsule 1   pantoprazole (PROTONIX) 40 MG tablet Take 1 tablet (40 mg total) by mouth daily. 30 tablet 3   potassium chloride SA (KLOR-CON M) 20 MEQ tablet Take 1 tablet (20 mEq total) by mouth 2 (two) times daily. (Patient not taking: Reported on 11/20/2021) 30 tablet 0   pregabalin (LYRICA) 75 MG capsule Take 1 capsule (75 mg total) by mouth 2 (two) times daily. 60 capsule 0   traZODone (DESYREL) 150 MG tablet Take 1 tablet (150 mg total) by mouth at bedtime. 90 tablet 0   [DISCONTINUED]  albuterol (PROVENTIL HFA;VENTOLIN HFA) 108 (90 Base) MCG/ACT inhaler Inhale 1-2 puffs into the lungs every 6 (six) hours as needed for wheezing or shortness of breath. (Patient not taking: Reported on 03/24/2019) 1 Inhaler 0   [DISCONTINUED] Cetirizine HCl 10 MG CAPS Take 1 capsule (10 mg total) by mouth daily. 15 capsule 0   [DISCONTINUED] sodium chloride (OCEAN) 0.65 % SOLN nasal spray Place 1 spray into both nostrils as needed for congestion. (Patient not taking: Reported on 03/24/2019) 30 mL 0   No current facility-administered medications on file prior to visit.     PAST SURGICAL HISTORY Past Surgical History:  Procedure Laterality Date   ESOPHAGOGASTRODUODENOSCOPY (EGD) WITH PROPOFOL N/A 04/04/2013   Procedure: ESOPHAGOGASTRODUODENOSCOPY (EGD) WITH PROPOFOL;  Surgeon: Arta Silence, MD;  Location: [facility];  Service: Endoscopy;  Laterality: N/A;   EXAMINATION UNDER ANESTHESIA N/A 08/10/2013   Procedure: RECTAL EXAM UNDER ANESTHESIA;  Surgeon: Joyice Faster. Cornett, MD;  Location: [facility];  Service: General;  Laterality: N/A;  ligation   HEMORRHOID SURGERY     MYRINGOTOMY     age 72    FAMILY HISTORY: Family History  Problem Relation Age of Onset   Heart disease Mother        Arrythmia   Kidney disease Mother    Hypertension Mother    COPD Mother    Diabetes Mother    Lung cancer Mother    Heart disease Father    Crohn's disease Maternal Aunt    Breast cancer Maternal Aunt        40's    SOCIAL HISTORY:  reports that she has been smoking cigarettes. She has a 3.75 pack-year smoking history. She has never used smokeless tobacco. She reports current alcohol use. She reports that she does not use drugs.  PERFORMANCE STATUS: The patient's performance status is 1 - Symptomatic but completely ambulatory  PHYSICAL EXAM: Most Recent Vital Signs: Last menstrual period 10/21/2021. BP (!) 89/56 (BP Location: Left Arm, Patient Position: Sitting)   Pulse 66   Temp  98 F (36.7 C) (Oral)   Resp 18   Ht 5\' 5"  (1.651 m)   Wt 140 lb 1.3 oz (63.5 kg)   LMP 10/21/2021   SpO2 100%   BMI 23.31 kg/m   General Appearance:    Alert, cooperative, no distress, appears stated age  Head:    Normocephalic, without obvious abnormality, atraumatic  Eyes:    PERRL, conjunctiva/corneas clear, EOM's intact, fundi    benign, both eyes        Throat:   Lips, mucosa, and tongue normal; teeth and gums normal  Neck:   Supple, symmetrical, trachea midline, no adenopathy;    thyroid:  no enlargement/tenderness/nodules; no carotid  bruit or JVD  Back:     Symmetric, no curvature, ROM normal, no CVA tenderness  Lungs:     Clear to auscultation bilaterally, respirations unlabored  Chest Wall:    No tenderness or deformity   Heart:    Regular rate and rhythm, S1 and S2 normal, no murmur, rub   or gallop     Abdomen:     Soft, non-tender, bowel sounds active all four quadrants,    no masses, no organomegaly        Extremities:   Extremities normal, atraumatic, no cyanosis or edema  Pulses:   2+ and symmetric all extremities  Skin:   Skin color, texture, turgor normal, no rashes or lesions  Lymph nodes:   Cervical, supraclavicular, and axillary nodes normal  Neurologic:   CNII-XII intact, normal strength, sensation and reflexes    throughout    LABORATORY DATA:  Results for orders placed or performed in visit on 11/22/21 (from the past 48 hour(s))  Reticulocytes     Status: Abnormal   Collection Time: 11/22/21  8:42 AM  Result Value Ref Range   Retic Ct Pct 1.3 0.4 - 3.1 %   RBC. 3.76 (L) 3.87 - 5.11 MIL/uL   Retic Count, Absolute 49.3 19.0 - 186.0 K/uL   Immature Retic Fract 25.5 (H) 2.3 - 15.9 %    Comment: Performed at [facility] at [facility], [address] [place], [place] [address]  CBC with Differential (Cancer Center Only)     Status: Abnormal   Collection Time: 11/22/21  8:42 AM  Result Value Ref Range   WBC Count 6.5  4.0 - 10.5 K/uL   RBC 3.77 (L) 3.87 - 5.11 MIL/uL   Hemoglobin 8.6 (L) 12.0 - 15.0 g/dL    Comment: Reticulocyte Hemoglobin testing may be clinically indicated, consider ordering this additional test [id number]    HCT 27.1 (L) 36.0 - 46.0 %   MCV 71.9 (L) 80.0 - 100.0 fL   MCH 22.8 (L) 26.0 - 34.0 pg   MCHC 31.7 30.0 - 36.0 g/dL   RDW 20.5 (H) 11.5 - 15.5 %   Platelet Count 709 (H) 150 - 400 K/uL   nRBC 0.0 0.0 - 0.2 %   Neutrophils Relative % 64 %   Neutro Abs 4.1 1.7 - 7.7 K/uL   Lymphocytes Relative 23 %   Lymphs Abs 1.5 0.7 - 4.0 K/uL   Monocytes Relative 8 %   Monocytes Absolute 0.5 0.1 - 1.0 K/uL   Eosinophils Relative 4 %   Eosinophils Absolute 0.2 0.0 - 0.5 K/uL   Basophils Relative 1 %   Basophils Absolute 0.0 0.0 - 0.1 K/uL   Immature Granulocytes 0 %   Abs Immature Granulocytes 0.01 0.00 - 0.07 K/uL    Comment: Performed at [facility] at [facility], [address], [place], [place] [address]      RADIOGRAPHY: No results found.     PATHOLOGY: None  ASSESSMENT/PLAN: Ms. Piggott is a very pleasant 51 yo African American female with long history of anemia.  She needs 3 doses of IV iron which we will start next week.  We will have her start taking folic acid 1 mg PO daily.  Follow-up in 8 weeks.   All questions were answered. The patient knows to call the clinic with any problems, questions or concerns. We can certainly see the patient much sooner if necessary.   Judson Roch  Montez Morita, NP

## 2021-11-23 LAB — ERYTHROPOIETIN: Erythropoietin: 151.8 m[IU]/mL — ABNORMAL HIGH (ref 2.6–18.5)

## 2021-11-25 ENCOUNTER — Ambulatory Visit: Payer: BLUE CROSS/BLUE SHIELD | Admitting: Physical Therapy

## 2021-11-25 NOTE — Telephone Encounter (Signed)
Called pt and she had already spoken to [place] about this issue. Waiting on call from neuro

## 2021-11-26 ENCOUNTER — Encounter: Payer: Self-pay | Admitting: Family

## 2021-11-27 LAB — HGB FRAC BY HPLC+SOLUBILITY
Hgb A: 62.9 % — ABNORMAL LOW (ref 96.4–98.8)
Hgb C: 0 %
Hgb E: 0 %
Hgb F: 0 % (ref 0.0–2.0)
Hgb S: 35.2 % — ABNORMAL HIGH
Hgb Solubility: POSITIVE — AB
Hgb Variant: 0.8 % — ABNORMAL HIGH

## 2021-11-27 LAB — HGB FRACTIONATION CASCADE: Hgb A2: 1.1 % — ABNORMAL LOW (ref 1.8–3.2)

## 2021-12-02 LAB — ALPHA-THALASSEMIA GENOTYPR

## 2021-12-03 ENCOUNTER — Ambulatory Visit: Payer: PRIVATE HEALTH INSURANCE | Admitting: Physical Therapy

## 2021-12-04 ENCOUNTER — Inpatient Hospital Stay: Payer: BLUE CROSS/BLUE SHIELD

## 2021-12-04 VITALS — BP 108/54 | HR 60 | Resp 17

## 2021-12-04 DIAGNOSIS — D509 Iron deficiency anemia, unspecified: Secondary | ICD-10-CM | POA: Diagnosis not present

## 2021-12-04 MED ORDER — SODIUM CHLORIDE 0.9 % IV SOLN
300.0000 mg | Freq: Once | INTRAVENOUS | Status: AC
  Administered 2021-12-04: 300 mg via INTRAVENOUS
  Filled 2021-12-04: qty 300

## 2021-12-04 MED ORDER — SODIUM CHLORIDE 0.9 % IV SOLN: Freq: Once | INTRAVENOUS | Status: AC

## 2021-12-04 NOTE — Patient Instructions (Signed)

## 2021-12-05 ENCOUNTER — Ambulatory Visit: Payer: PRIVATE HEALTH INSURANCE | Admitting: Physician Assistant

## 2021-12-05 ENCOUNTER — Encounter: Payer: Self-pay | Admitting: Physician Assistant

## 2021-12-05 DIAGNOSIS — Z029 Encounter for administrative examinations, unspecified: Secondary | ICD-10-CM

## 2021-12-11 ENCOUNTER — Inpatient Hospital Stay: Payer: BLUE CROSS/BLUE SHIELD

## 2021-12-11 VITALS — BP 101/50 | HR 70 | Temp 98.4°F | Resp 17

## 2021-12-11 DIAGNOSIS — D509 Iron deficiency anemia, unspecified: Secondary | ICD-10-CM

## 2021-12-11 MED ORDER — SODIUM CHLORIDE 0.9 % IV SOLN: Freq: Once | INTRAVENOUS | Status: AC

## 2021-12-11 MED ORDER — SODIUM CHLORIDE 0.9 % IV SOLN
300.0000 mg | Freq: Once | INTRAVENOUS | Status: AC
  Administered 2021-12-11: 300 mg via INTRAVENOUS
  Filled 2021-12-11: qty 300

## 2021-12-11 NOTE — Patient Instructions (Signed)

## 2021-12-18 ENCOUNTER — Encounter: Payer: Self-pay | Admitting: *Deleted

## 2021-12-18 ENCOUNTER — Inpatient Hospital Stay: Payer: BLUE CROSS/BLUE SHIELD | Attending: Hematology & Oncology

## 2021-12-18 VITALS — BP 120/60 | HR 50 | Resp 16

## 2021-12-18 DIAGNOSIS — D509 Iron deficiency anemia, unspecified: Secondary | ICD-10-CM | POA: Diagnosis not present

## 2021-12-18 MED ORDER — SODIUM CHLORIDE 0.9 % IV SOLN: Freq: Once | INTRAVENOUS | Status: AC

## 2021-12-18 MED ORDER — SODIUM CHLORIDE 0.9 % IV SOLN
300.0000 mg | Freq: Once | INTRAVENOUS | Status: AC
  Administered 2021-12-18: 300 mg via INTRAVENOUS
  Filled 2021-12-18: qty 300

## 2021-12-18 NOTE — Patient Instructions (Signed)

## 2021-12-19 ENCOUNTER — Other Ambulatory Visit: Payer: Self-pay | Admitting: *Deleted

## 2021-12-19 ENCOUNTER — Other Ambulatory Visit: Payer: Self-pay | Admitting: Physician Assistant

## 2021-12-19 DIAGNOSIS — G500 Trigeminal neuralgia: Secondary | ICD-10-CM

## 2021-12-19 MED ORDER — CARBAMAZEPINE ER 100 MG PO CP12: 100.0000 mg | ORAL_CAPSULE | Freq: Every day | ORAL | 1 refills | Status: DC

## 2021-12-24 ENCOUNTER — Other Ambulatory Visit: Payer: Self-pay

## 2021-12-24 ENCOUNTER — Emergency Department (HOSPITAL_COMMUNITY)
Admission: EM | Admit: 2021-12-24 | Discharge: 2021-12-24 | Discharge status: Against Medical Advice | Payer: BLUE CROSS/BLUE SHIELD | Attending: Emergency Medicine | Admitting: Emergency Medicine

## 2021-12-24 ENCOUNTER — Encounter: Payer: Self-pay | Admitting: Family

## 2021-12-24 ENCOUNTER — Encounter (HOSPITAL_COMMUNITY): Payer: Self-pay

## 2021-12-24 DIAGNOSIS — K029 Dental caries, unspecified: Secondary | ICD-10-CM | POA: Insufficient documentation

## 2021-12-24 DIAGNOSIS — M542 Cervicalgia: Secondary | ICD-10-CM | POA: Insufficient documentation

## 2021-12-24 DIAGNOSIS — K0889 Other specified disorders of teeth and supporting structures: Secondary | ICD-10-CM

## 2021-12-24 DIAGNOSIS — Z5329 Procedure and treatment not carried out because of patient's decision for other reasons: Secondary | ICD-10-CM | POA: Insufficient documentation

## 2021-12-24 MED ORDER — IBUPROFEN 600 MG PO TABS: 600.0000 mg | ORAL_TABLET | Freq: Four times a day (QID) | ORAL | 0 refills | Status: DC | PRN

## 2021-12-24 MED ORDER — IBUPROFEN 800 MG PO TABS
800.0000 mg | ORAL_TABLET | Freq: Once | ORAL | Status: DC
Start: 2021-12-24 — End: 2021-12-25

## 2021-12-24 MED ORDER — PENICILLIN V POTASSIUM 500 MG PO TABS: 500.0000 mg | ORAL_TABLET | Freq: Three times a day (TID) | ORAL | 0 refills | Status: DC

## 2021-12-24 NOTE — ED Provider Notes (Signed)
[facility]-EMERGENCY DEPT Provider Note   CSN: [id number] Arrival date & time: 12/24/21  1803     History  Chief Complaint  Patient presents with   Dental Pain   Neck Pain    Jenny Evans is a 51 y.o. female.  The history is provided by the patient and medical records. No language interpreter was used.  Dental Pain Associated symptoms: neck pain   Neck Pain   51 year old female presenting complaint of dental pain.  Patient reports she has had left  upper dental pain for the past week.  She described pain as a sharp shooting sensation worse with chewing and with cold temperature.  Pain is moderate in severity without any associated fever or trouble swallowing.  She does have left lateral neck pain that has been ongoing for the past 2-1/2 months.  Has been seen and evaluated for that complaint and have been prescribed meloxicam.  She felt the combination of that neck pain along with the dental pain has become more intense for her.  She does not endorse fever or recent injury or hearing changes.  Home Medications Prior to Admission medications   Medication Sig Start Date End Date Taking? Authorizing Provider  carbamazepine (CARBATROL) 100 MG 12 hr capsule Take 1 capsule (100 mg total) by mouth daily. 12/19/21   Myra Rude, MD  folic acid (FOLVITE) 1 MG tablet Take 1 tablet (1 mg total) by mouth daily. 11/22/21   Erenest Blank, NP  meloxicam (MOBIC) 15 MG tablet One tab PO qAM with breakfast for 2 weeks, then daily prn pain. 10/29/21   Myra Rude, MD  pantoprazole (PROTONIX) 40 MG tablet Take 1 tablet (40 mg total) by mouth daily. 11/20/21   Claiborne Rigg, NP  traZODone (DESYREL) 150 MG tablet Take 1 tablet (150 mg total) by mouth at bedtime. 11/20/21   Claiborne Rigg, NP  albuterol (PROVENTIL HFA;VENTOLIN HFA) 108 (90 Base) MCG/ACT inhaler Inhale 1-2 puffs into the lungs every 6 (six) hours as needed for wheezing or shortness of breath. Patient not  taking: Reported on 03/24/2019 04/08/18 09/22/19  Long, [name], MD  Cetirizine HCl 10 MG CAPS Take 1 capsule (10 mg total) by mouth daily. 09/22/19 01/23/20  Wieters, Hallie C, PA-C  sodium chloride (OCEAN) 0.65 % SOLN nasal spray Place 1 spray into both nostrils as needed for congestion. Patient not taking: Reported on 03/24/2019 12/14/18 09/22/19  Leretha Dykes, PA-C      Allergies    Hydrocodone-acetaminophen    Review of Systems   Review of Systems  Musculoskeletal:  Positive for neck pain.  All other systems reviewed and are negative.   Physical Exam Updated Vital Signs BP 128/72 (BP Location: Right Arm)   Pulse 67   Temp 98.7 F (37.1 C) (Oral)   Resp 16   Ht 5\' 4"  (1.626 m)   Wt 65.8 kg   SpO2 100%   BMI 24.89 kg/m  Physical Exam Vitals and nursing note reviewed.  Constitutional:      General: She is not in acute distress.    Appearance: She is well-developed.  HENT:     Head: Atraumatic.     Mouth/Throat:     Comments: Mouth: Tenderness noted to tooth #13 with mild decay but no gingival erythema and no abscess amenable for drainage.  No facial involvement. Eyes:     Conjunctiva/sclera: Conjunctivae normal.  Pulmonary:     Effort: Pulmonary effort is normal.  Musculoskeletal:     Cervical back: Neck supple.  Skin:    Findings: No rash.  Neurological:     Mental Status: She is alert.  Psychiatric:        Mood and Affect: Mood normal.     ED Results / Procedures / Treatments   Labs (all labs ordered are listed, but only abnormal results are displayed) Labs Reviewed - No data to display  EKG None  Radiology No results found.  Procedures Procedures    Medications Ordered in ED Medications - No data to display  ED Course/ Medical Decision Making/ A&P                           Medical Decision Making  Presentation consistent with dental pain of tooth # 13. No evidence of Ludwig's Angina, large abscess pocket, requirement for emergent extraction, or  other complications. Provided prescription for penicillin 500 mg twice a day x 10 days. Provided prescription for ibuprofen. Patient informed to follow up with [facility] or local provider. Return to ER if pain uncontrolled, abscess that drains purulent fluid, high fevers, trouble swallowing, or other concerns.  Impression: Dental Pain   Plan:    * Discharge from ED   * Penicillin V 500 mg BID x 10 days   * ibuprofen   * F/U with local dentist   * Informed to return to ED if has new or worsening symptoms. Expressed understanding of and agreement with plan and all questions answered.         Final Clinical Impression(s) / ED Diagnoses Final diagnoses:  Pain, dental    Rx / DC Orders ED Discharge Orders          Ordered    penicillin v potassium (VEETID) 500 MG tablet  3 times daily        12/24/21 1930    ibuprofen (ADVIL) 600 MG tablet  Every 6 hours PRN        12/24/21 1930              Fayrene Helper, PA-C 12/24/21 1931    Mancel Bale, MD 12/25/21 [phone]

## 2021-12-24 NOTE — ED Triage Notes (Signed)
Patient c/o left lower dental pain x 1 week. Patient states her filling came out of her tooth.  Patient also c/o left  lateral neck pain x 2 1/2 months. Patient has been seen a few times for the same.

## 2021-12-24 NOTE — Discharge Instructions (Signed)
You have been diagnosed with dental pain.  Please take antibiotic as prescribed.  You may take meloxicam or take ibuprofen for pain.  Call and follow-up closely with a dentist for further management.

## 2021-12-24 NOTE — ED Notes (Signed)
Upon me entering exam room to provide Ibuprofen and discharge education, pt could not be located within department or waiting room.

## 2022-01-15 ENCOUNTER — Other Ambulatory Visit: Payer: Self-pay

## 2022-01-15 ENCOUNTER — Emergency Department (HOSPITAL_BASED_OUTPATIENT_CLINIC_OR_DEPARTMENT_OTHER)
Admission: EM | Admit: 2022-01-15 | Discharge: 2022-01-15 | Disposition: A | Discharge status: Home / Self Care | Payer: BLUE CROSS/BLUE SHIELD | Attending: Emergency Medicine | Admitting: Emergency Medicine

## 2022-01-15 ENCOUNTER — Emergency Department (HOSPITAL_BASED_OUTPATIENT_CLINIC_OR_DEPARTMENT_OTHER): Payer: BLUE CROSS/BLUE SHIELD

## 2022-01-15 ENCOUNTER — Encounter (HOSPITAL_BASED_OUTPATIENT_CLINIC_OR_DEPARTMENT_OTHER): Payer: Self-pay | Admitting: Emergency Medicine

## 2022-01-15 DIAGNOSIS — Y9389 Activity, other specified: Secondary | ICD-10-CM | POA: Insufficient documentation

## 2022-01-15 DIAGNOSIS — S9032XA Contusion of left foot, initial encounter: Secondary | ICD-10-CM | POA: Diagnosis not present

## 2022-01-15 DIAGNOSIS — S99922A Unspecified injury of left foot, initial encounter: Secondary | ICD-10-CM | POA: Diagnosis present

## 2022-01-15 DIAGNOSIS — W1830XA Fall on same level, unspecified, initial encounter: Secondary | ICD-10-CM | POA: Diagnosis not present

## 2022-01-15 MED ORDER — IBUPROFEN 800 MG PO TABS: 800.0000 mg | ORAL_TABLET | Freq: Three times a day (TID) | ORAL | 0 refills | Status: DC

## 2022-01-15 MED ORDER — ACETAMINOPHEN 500 MG PO TABS
1000.0000 mg | ORAL_TABLET | Freq: Once | ORAL | Status: AC
  Administered 2022-01-15: 1000 mg via ORAL
  Filled 2022-01-15: qty 2

## 2022-01-15 MED ORDER — IBUPROFEN 800 MG PO TABS
800.0000 mg | ORAL_TABLET | Freq: Once | ORAL | Status: AC
  Administered 2022-01-15: 800 mg via ORAL
  Filled 2022-01-15: qty 1

## 2022-01-15 NOTE — ED Triage Notes (Signed)
Pt getting pallet at work to put on a lift and fell on left foot.

## 2022-01-15 NOTE — ED Provider Notes (Signed)
[facility] EMERGENCY DEPARTMENT Provider Note   CSN: [id number] Arrival date & time: 01/15/22  0029     History  Chief Complaint  Patient presents with   Foot Injury    Jenny Evans is a 51 y.o. female.  The history is provided by the patient.  Foot Injury Location:  Foot Time since incident:  2 hours Injury: yes   Mechanism of injury comment:  Palette onto foot Foot location:  L foot Pain details:    Quality:  Aching   Radiates to:  Does not radiate   Severity:  Moderate   Onset quality:  Sudden   Duration:  2 hours   Timing:  Constant   Progression:  Unchanged Chronicity:  New Foreign body present:  No foreign bodies Prior injury to area:  No Relieved by:  Nothing Worsened by:  Nothing Ineffective treatments:  None tried Associated symptoms: no fever   Risk factors: no concern for non-accidental trauma        Home Medications Prior to Admission medications   Medication Sig Start Date End Date Taking? Authorizing Provider  ibuprofen (ADVIL) 800 MG tablet Take 1 tablet (800 mg total) by mouth 3 (three) times daily. 01/15/22  Yes Renata Gambino, MD  carbamazepine (CARBATROL) 100 MG 12 hr capsule Take 1 capsule (100 mg total) by mouth daily. 12/19/21   Myra Rude, MD  folic acid (FOLVITE) 1 MG tablet Take 1 tablet (1 mg total) by mouth daily. 11/22/21   Erenest Blank, NP  ibuprofen (ADVIL) 600 MG tablet Take 1 tablet (600 mg total) by mouth every 6 (six) hours as needed. 12/24/21   Fayrene Helper, PA-C  meloxicam (MOBIC) 15 MG tablet One tab PO qAM with breakfast for 2 weeks, then daily prn pain. 10/29/21   Myra Rude, MD  pantoprazole (PROTONIX) 40 MG tablet Take 1 tablet (40 mg total) by mouth daily. 11/20/21   Claiborne Rigg, NP  penicillin v potassium (VEETID) 500 MG tablet Take 1 tablet (500 mg total) by mouth 3 (three) times daily. 12/24/21   Fayrene Helper, PA-C  traZODone (DESYREL) 150 MG tablet Take 1 tablet (150 mg total) by mouth at  bedtime. 11/20/21   Claiborne Rigg, NP  albuterol (PROVENTIL HFA;VENTOLIN HFA) 108 (90 Base) MCG/ACT inhaler Inhale 1-2 puffs into the lungs every 6 (six) hours as needed for wheezing or shortness of breath. Patient not taking: Reported on 03/24/2019 04/08/18 09/22/19  Long, [name], MD  Cetirizine HCl 10 MG CAPS Take 1 capsule (10 mg total) by mouth daily. 09/22/19 01/23/20  Wieters, Hallie C, PA-C  sodium chloride (OCEAN) 0.65 % SOLN nasal spray Place 1 spray into both nostrils as needed for congestion. Patient not taking: Reported on 03/24/2019 12/14/18 09/22/19  Leretha Dykes, PA-C      Allergies    Hydrocodone-acetaminophen    Review of Systems   Review of Systems  Constitutional:  Negative for fever.  HENT:  Negative for facial swelling.   Eyes:  Negative for redness.  Respiratory:  Negative for wheezing and stridor.   Musculoskeletal:  Negative for arthralgias.  All other systems reviewed and are negative.   Physical Exam Updated Vital Signs BP 106/80   Pulse 62   Temp 98.2 F (36.8 C) (Oral)   Resp 15   Ht 5\' 4"  (1.626 m)   Wt 65.8 kg   SpO2 99%   BMI 24.89 kg/m  Physical Exam Vitals and nursing note reviewed.  Constitutional:      General: She is not in acute distress.    Appearance: Normal appearance. She is well-developed.  HENT:     Head: Normocephalic and atraumatic.     Nose: Nose normal.  Eyes:     Pupils: Pupils are equal, round, and reactive to light.  Cardiovascular:     Rate and Rhythm: Normal rate and regular rhythm.     Pulses: Normal pulses.     Heart sounds: Normal heart sounds.  Pulmonary:     Effort: Pulmonary effort is normal. No respiratory distress.     Breath sounds: Normal breath sounds.  Abdominal:     General: Bowel sounds are normal. There is no distension.     Palpations: Abdomen is soft.     Tenderness: There is no abdominal tenderness. There is no guarding or rebound.  Genitourinary:    Vagina: No vaginal discharge.   Musculoskeletal:        General: Normal range of motion.     Cervical back: Neck supple.     Right ankle: Normal.     Right foot: Normal. Normal range of motion and normal capillary refill. No swelling, deformity, laceration, tenderness, bony tenderness or crepitus. Normal pulse.  Skin:    General: Skin is warm and dry.     Capillary Refill: Capillary refill takes less than 2 seconds.     Findings: No erythema or rash.  Neurological:     General: No focal deficit present.     Mental Status: She is alert.     Deep Tendon Reflexes: Reflexes normal.  Psychiatric:        Mood and Affect: Mood normal.     ED Results / Procedures / Treatments   Labs (all labs ordered are listed, but only abnormal results are displayed) Labs Reviewed - No data to display  EKG None  Radiology DG Foot Complete Left  Result Date: 01/15/2022 CLINICAL DATA:  Pallet fell on left foot with pain. EXAM: LEFT FOOT - COMPLETE 3+ VIEW COMPARISON:  None Available. FINDINGS: There is no evidence of fracture or dislocation. There is no evidence of arthropathy or other focal bone abnormality. Soft tissues are unremarkable. IMPRESSION: Negative. Electronically Signed   By: Thornell Sartorius M.D.   On: 01/15/2022 00:55    Procedures Procedures    Medications Ordered in ED Medications  acetaminophen (TYLENOL) tablet 1,000 mg (1,000 mg Oral Given 01/15/22 0111)  ibuprofen (ADVIL) tablet 800 mg (800 mg Oral Given 01/15/22 0111)    ED Course/ Medical Decision Making/ A&P                           Medical Decision Making Foot pain after injury at work   Amount and/or Complexity of Data Reviewed External Data Reviewed: notes.    Details: previous notes reviewed Radiology: ordered and independent interpretation performed.    Details: no fracture by me on XR  Risk OTC drugs. Prescription drug management. Risk Details: Ice, elevation and NSAIDs.  Tight fitting lace up shoe.  Follow up with your occupational medicine  specialist.  Strict return     Final Clinical Impression(s) / ED Diagnoses Final diagnoses:  Contusion of left foot, initial encounter   Return for intractable cough, coughing up blood, fevers > 100.4 unrelieved by medication, shortness of breath, intractable vomiting, chest pain, shortness of breath, weakness, numbness, changes in speech, facial asymmetry, abdominal pain, passing out, Inability to tolerate liquids or food,  cough, altered mental status or any concerns. No signs of systemic illness or infection. The patient is nontoxic-appearing on exam and vital signs are within normal limits.  I have reviewed the triage vital signs and the nursing notes. Pertinent labs & imaging results that were available during my care of the patient were reviewed by me and considered in my medical decision making (see chart for details). After history, exam, and medical workup I feel the patient has been appropriately medically screened and is safe for discharge home. Pertinent diagnoses were discussed with the patient. Patient was given return precautions.  Rx / DC Orders ED Discharge Orders          Ordered    ibuprofen (ADVIL) 800 MG tablet  3 times daily        01/15/22 0107              Tanique Matney, MD 01/15/22 0209

## 2022-01-22 ENCOUNTER — Encounter: Payer: Self-pay | Admitting: Family

## 2022-01-22 ENCOUNTER — Telehealth: Payer: Self-pay | Admitting: *Deleted

## 2022-01-22 ENCOUNTER — Inpatient Hospital Stay: Payer: BLUE CROSS/BLUE SHIELD | Attending: Hematology & Oncology

## 2022-01-22 ENCOUNTER — Other Ambulatory Visit: Payer: Self-pay

## 2022-01-22 ENCOUNTER — Inpatient Hospital Stay (HOSPITAL_BASED_OUTPATIENT_CLINIC_OR_DEPARTMENT_OTHER): Payer: BLUE CROSS/BLUE SHIELD | Admitting: Family

## 2022-01-22 ENCOUNTER — Inpatient Hospital Stay: Payer: BLUE CROSS/BLUE SHIELD

## 2022-01-22 VITALS — BP 108/72 | HR 64 | Temp 98.2°F | Resp 18 | Ht 65.0 in | Wt 132.4 lb

## 2022-01-22 DIAGNOSIS — D573 Sickle-cell trait: Secondary | ICD-10-CM

## 2022-01-22 DIAGNOSIS — D649 Anemia, unspecified: Secondary | ICD-10-CM | POA: Diagnosis not present

## 2022-01-22 DIAGNOSIS — D509 Iron deficiency anemia, unspecified: Secondary | ICD-10-CM

## 2022-01-22 LAB — CBC WITH DIFFERENTIAL (CANCER CENTER ONLY)
Abs Immature Granulocytes: 0.04 10*3/uL (ref 0.00–0.07)
Basophils Absolute: 0 10*3/uL (ref 0.0–0.1)
Basophils Relative: 1 %
Eosinophils Absolute: 0.1 10*3/uL (ref 0.0–0.5)
Eosinophils Relative: 2 %
HCT: 34.7 % — ABNORMAL LOW (ref 36.0–46.0)
Hemoglobin: 11.8 g/dL — ABNORMAL LOW (ref 12.0–15.0)
Immature Granulocytes: 1 %
Lymphocytes Relative: 36 %
Lymphs Abs: 1.6 10*3/uL (ref 0.7–4.0)
MCH: 27.8 pg (ref 26.0–34.0)
MCHC: 34 g/dL (ref 30.0–36.0)
MCV: 81.6 fL (ref 80.0–100.0)
Monocytes Absolute: 0.4 10*3/uL (ref 0.1–1.0)
Monocytes Relative: 8 %
Neutro Abs: 2.3 10*3/uL (ref 1.7–7.7)
Neutrophils Relative %: 52 %
Platelet Count: 337 10*3/uL (ref 150–400)
RBC: 4.25 MIL/uL (ref 3.87–5.11)
WBC Count: 4.4 10*3/uL (ref 4.0–10.5)
nRBC: 0 % (ref 0.0–0.2)

## 2022-01-22 LAB — FERRITIN: Ferritin: 50 ng/mL (ref 11–307)

## 2022-01-22 LAB — IRON AND IRON BINDING CAPACITY (CC-WL,HP ONLY)
Iron: 100 ug/dL (ref 28–170)
Saturation Ratios: 34 % — ABNORMAL HIGH (ref 10.4–31.8)
TIBC: 293 ug/dL (ref 250–450)
UIBC: 193 ug/dL (ref 148–442)

## 2022-01-22 LAB — RETICULOCYTES
Immature Retic Fract: 8.5 % (ref 2.3–15.9)
RBC.: 4.25 MIL/uL (ref 3.87–5.11)
Retic Count, Absolute: 47.2 10*3/uL (ref 19.0–186.0)
Retic Ct Pct: 1.1 % (ref 0.4–3.1)

## 2022-01-22 NOTE — Progress Notes (Signed)
Hematology and Oncology Follow Up Visit  Jenny Evans [id number] 10-30-1970 51 y.o. 01/22/2022   Principle Diagnosis:  Sickle cell trait Thalassemia Iron deficiency anemia  Current Therapy:   IV iron as indicated  Folic acid 1 mg PO daily   Interim History:  Jenny Evans is here today for follow-up. She is feeling much better and has no complaints at this time.  She has mild SOB with stairs but will take a break when needed.  No blood loss noted. No abnormal bruising, no petechiae.  No fever, chills, n/v, cough, rash, dizziness, chest pain, palpitations, abdominal pain or changes in bowel or bladder habits.  No swelling, tenderness, numbness or tingling in her extremities.  No falls or syncope.  Appetite and hydration are good. Weight stable at 132 lbs.   ECOG Performance Status: 1 - Symptomatic but completely ambulatory  Medications:  Allergies as of 01/22/2022       Reactions   Hydrocodone-acetaminophen Nausea And Vomiting   Patient can tolerate acetaminophen        Medication List        Accurate as of January 22, 2022  8:39 AM. If you have any questions, ask your nurse or doctor.          STOP taking these medications    ibuprofen 600 MG tablet Commonly known as: ADVIL Stopped by: Eileen Stanford, NP   ibuprofen 800 MG tablet Commonly known as: ADVIL Stopped by: Eileen Stanford, NP   meloxicam 15 MG tablet Commonly known as: MOBIC Stopped by: Eileen Stanford, NP   penicillin v potassium 500 MG tablet Commonly known as: VEETID Stopped by: Eileen Stanford, NP       TAKE these medications    carbamazepine 100 MG 12 hr capsule Commonly known as: CARBATROL Take 1 capsule (100 mg total) by mouth daily.   folic acid 1 MG tablet Commonly known as: FOLVITE Take 1 tablet (1 mg total) by mouth daily.   pantoprazole 40 MG tablet Commonly known as: PROTONIX Take 1 tablet (40 mg total) by mouth daily.   traZODone 150 MG tablet Commonly known as: DESYREL Take  1 tablet (150 mg total) by mouth at bedtime.        Allergies:  Allergies  Allergen Reactions   Hydrocodone-Acetaminophen Nausea And Vomiting    Patient can tolerate acetaminophen    Past Medical History, Surgical history, Social history, and Family History were reviewed and updated.  Review of Systems: All other 10 point review of systems is negative.   Physical Exam:  height is 5\' 5"  (1.651 m) and weight is 132 lb 6.4 oz (60.1 kg). Her oral temperature is 98.2 F (36.8 C). Her blood pressure is 108/72 and her pulse is 64. Her respiration is 18 and oxygen saturation is 100%.   Wt Readings from Last 3 Encounters:  01/22/22 132 lb 6.4 oz (60.1 kg)  01/15/22 145 lb (65.8 kg)  12/24/21 145 lb (65.8 kg)    Ocular: Sclerae unicteric, pupils equal, round and reactive to light Ear-nose-throat: Oropharynx clear, dentition fair Lymphatic: No cervical or supraclavicular adenopathy Lungs no rales or rhonchi, good excursion bilaterally Heart regular rate and rhythm, no murmur appreciated Abd soft, nontender, positive bowel sounds MSK no focal spinal tenderness, no joint edema Neuro: non-focal, well-oriented, appropriate affect Breasts: Deferred   Lab Results  Component Value Date   WBC 4.4 01/22/2022   HGB 11.8 (L) 01/22/2022   HCT 34.7 (L) 01/22/2022   MCV 81.6 01/22/2022  PLT 337 01/22/2022   Lab Results  Component Value Date   FERRITIN 5 (L) 11/22/2021   IRON 6 (L) 11/22/2021   TIBC 498 (H) 11/22/2021   UIBC 492 (H) 11/22/2021   IRONPCTSAT 1 (L) 11/22/2021   Lab Results  Component Value Date   RETICCTPCT 1.1 01/22/2022   RBC 4.25 01/22/2022   No results found for: "KPAFRELGTCHN", "LAMBDASER", "KAPLAMBRATIO" No results found for: "IGGSERUM", "IGA", "IGMSERUM" No results found for: "TOTALPROTELP", "ALBUMINELP", "A1GS", "A2GS", "BETS", "BETA2SER", "GAMS", "MSPIKE", "SPEI"   Chemistry      Component Value Date/Time   NA 136 11/22/2021 0842   NA 138 11/20/2021  1025   K 3.3 (L) 11/22/2021 0842   CL 105 11/22/2021 0842   CO2 22 11/22/2021 0842   BUN 11 11/22/2021 0842   BUN 7 11/20/2021 1025   CREATININE 0.77 11/22/2021 0842      Component Value Date/Time   CALCIUM 9.3 11/22/2021 0842   ALKPHOS 68 11/22/2021 0842   AST 16 11/22/2021 0842   ALT 11 11/22/2021 0842   BILITOT 0.2 (L) 11/22/2021 0842       Impression and Plan: Jenny Evans is a very pleasant 51 yo African American female with long history of multifactorial anemia.  Iron studies are pending.  Continue folic acid daily.  Follow-up in 4 months.   Eileen Stanford, NP 8/9/20238:39 AM

## 2022-01-22 NOTE — Telephone Encounter (Signed)
Per 01/22/22 los - called and gave upcoming appointments - confirmed 

## 2022-02-14 ENCOUNTER — Ambulatory Visit: Payer: PRIVATE HEALTH INSURANCE | Admitting: Nurse Practitioner

## 2022-02-28 ENCOUNTER — Ambulatory Visit: Payer: PRIVATE HEALTH INSURANCE | Admitting: Nurse Practitioner

## 2022-03-16 ENCOUNTER — Encounter (HOSPITAL_BASED_OUTPATIENT_CLINIC_OR_DEPARTMENT_OTHER): Payer: Self-pay | Admitting: Emergency Medicine

## 2022-03-16 ENCOUNTER — Ambulatory Visit
Admission: EM | Admit: 2022-03-16 | Discharge: 2022-03-16 | Discharge status: Against Medical Advice | Payer: BLUE CROSS/BLUE SHIELD

## 2022-03-16 ENCOUNTER — Emergency Department (HOSPITAL_BASED_OUTPATIENT_CLINIC_OR_DEPARTMENT_OTHER)
Admission: EM | Admit: 2022-03-16 | Discharge: 2022-03-16 | Disposition: A | Discharge status: Home / Self Care | Payer: BLUE CROSS/BLUE SHIELD | Attending: Student | Admitting: Student

## 2022-03-16 ENCOUNTER — Other Ambulatory Visit: Payer: Self-pay

## 2022-03-16 DIAGNOSIS — H5712 Ocular pain, left eye: Secondary | ICD-10-CM | POA: Diagnosis not present

## 2022-03-16 DIAGNOSIS — J45909 Unspecified asthma, uncomplicated: Secondary | ICD-10-CM | POA: Insufficient documentation

## 2022-03-16 DIAGNOSIS — H5710 Ocular pain, unspecified eye: Secondary | ICD-10-CM | POA: Diagnosis present

## 2022-03-16 MED ORDER — FLUORESCEIN SODIUM 1 MG OP STRP
1.0000 | ORAL_STRIP | Freq: Once | OPHTHALMIC | Status: AC
  Administered 2022-03-16: 1 via OPHTHALMIC
  Filled 2022-03-16: qty 1

## 2022-03-16 MED ORDER — TETRACAINE HCL 0.5 % OP SOLN
2.0000 [drp] | Freq: Once | OPHTHALMIC | Status: AC
  Administered 2022-03-16: 2 [drp] via OPHTHALMIC
  Filled 2022-03-16: qty 4

## 2022-03-16 NOTE — ED Notes (Signed)
Pt accidentally sprayed triazicide insect killer into her left eye between 8-9pm last night. She flushed with water for about 20 minutes immediately after and flushed for about 10 minutes again this morning but still feels uncomfortable. Sclera does not appear red in comparison to unaffected eye. EDP currently at bedside for exam.   Called Poison Control at [phone] and spoke to nurse who states that the substance is an irritant. Recommends that provider perform eye exam, continue to flush as needed, and manage symptoms.

## 2022-03-16 NOTE — ED Triage Notes (Signed)
Pt reports she accidentally sprayed bug killer (mixed with water from a water hose) in LT eye last night; she flushed it last night, but irritation continues today

## 2022-03-16 NOTE — ED Provider Notes (Signed)
[facility] EMERGENCY DEPARTMENT Provider Note   CSN: [id number] Arrival date & time: 03/16/22  1501     History  Chief Complaint  Patient presents with   Eye Problem    Jenny Evans is a 51 y.o. female.  Patient with history of asthma and anemia presents today with complaints of eye pain. She states that yesterday and 8-9 pm she was spraying Triazicide insect killer on her lawn and a few drops splashed into her eyes. She endorses pain since then. States that she has been regularly flushing her eyes but has been unable to get relief of her symptoms. She denies any changes in her vision. She does not wear contact lenses.   The history is provided by the patient. No language interpreter was used.  Eye Problem      Home Medications Prior to Admission medications   Medication Sig Start Date End Date Taking? Authorizing Provider  carbamazepine (CARBATROL) 100 MG 12 hr capsule Take 1 capsule (100 mg total) by mouth daily. 12/19/21   Myra Rude, MD  folic acid (FOLVITE) 1 MG tablet Take 1 tablet (1 mg total) by mouth daily. 11/22/21   Erenest Blank, NP  pantoprazole (PROTONIX) 40 MG tablet Take 1 tablet (40 mg total) by mouth daily. 11/20/21   Claiborne Rigg, NP  traZODone (DESYREL) 150 MG tablet Take 1 tablet (150 mg total) by mouth at bedtime. 11/20/21   Claiborne Rigg, NP  albuterol (PROVENTIL HFA;VENTOLIN HFA) 108 (90 Base) MCG/ACT inhaler Inhale 1-2 puffs into the lungs every 6 (six) hours as needed for wheezing or shortness of breath. Patient not taking: Reported on 03/24/2019 04/08/18 09/22/19  Long, [name], MD  Cetirizine HCl 10 MG CAPS Take 1 capsule (10 mg total) by mouth daily. 09/22/19 01/23/20  Wieters, Hallie C, PA-C  sodium chloride (OCEAN) 0.65 % SOLN nasal spray Place 1 spray into both nostrils as needed for congestion. Patient not taking: Reported on 03/24/2019 12/14/18 09/22/19  Leretha Dykes, PA-C      Allergies    Hydrocodone-acetaminophen     Review of Systems   Review of Systems  Eyes:  Positive for pain.  All other systems reviewed and are negative.   Physical Exam Updated Vital Signs BP 109/78   Pulse 88   Temp 98.5 F (36.9 C) (Oral)   Resp 18   Ht 5\' 4"  (1.626 m)   Wt 61.2 kg   LMP 03/10/2022   SpO2 97%   BMI 23.17 kg/m  Physical Exam Vitals and nursing note reviewed.  Constitutional:      General: She is not in acute distress.    Appearance: Normal appearance. She is normal weight. She is not ill-appearing, toxic-appearing or diaphoretic.  HENT:     Head: Normocephalic and atraumatic.  Eyes:     General: Lids are normal. Lids are everted, no foreign bodies appreciated. Vision grossly intact.        Right eye: No foreign body, discharge or hordeolum.        Left eye: No foreign body, discharge or hordeolum.     Intraocular pressure: Left eye pressure is 19 mmHg. Measurements were taken using an automated tonometer.    Extraocular Movements: Extraocular movements intact.     Right eye: Normal extraocular motion and no nystagmus.     Left eye: Normal extraocular motion and no nystagmus.     Conjunctiva/sclera:     Right eye: Right conjunctiva is not injected. No  chemosis, exudate or hemorrhage.    Left eye: Left conjunctiva is not injected. No chemosis, exudate or hemorrhage.    Pupils: Pupils are equal, round, and reactive to light.     Comments: PERRLA, EOMs intact without pain. No conjunctival erythema. No visualized foreign body. No fluorescin uptake on Woods Lamp exam.   Cardiovascular:     Rate and Rhythm: Normal rate.  Pulmonary:     Effort: Pulmonary effort is normal. No respiratory distress.  Musculoskeletal:        General: Normal range of motion.     Cervical back: Normal range of motion.  Skin:    General: Skin is warm and dry.  Neurological:     General: No focal deficit present.     Mental Status: She is alert.  Psychiatric:        Mood and Affect: Mood normal.        Behavior:  Behavior normal.     ED Results / Procedures / Treatments   Labs (all labs ordered are listed, but only abnormal results are displayed) Labs Reviewed - No data to display  EKG None  Radiology No results found.  Procedures Procedures    Medications Ordered in ED Medications  fluorescein ophthalmic strip 1 strip (1 strip Left Eye Given 03/16/22 1735)  tetracaine (PONTOCAINE) 0.5 % ophthalmic solution 2 drop (2 drops Left Eye Given 03/16/22 1735)    ED Course/ Medical Decision Making/ A&P                           Medical Decision Making Risk Prescription drug management.   Patient presents today with complaints of left eye pain after exposure to triazicide insect killer. She is afebrile, non-toxic appearing, and in no acute distress with reassuring vital signs.  Physical exam reveals vision that is intact bilaterally.  Tetracaine, fluoroscein and wood's lamp with no evidence of injury / defect to the left eye. Normal pupillary exam and normal EOM.  No consensual pain.  Eye rinsed with minimal relief.  No concern for abrasion or other emergent concerns.  No indication for topical antibiotics at this time.  Poison control was contacted and recommended continued flushing as needed for symptom relief but no other interventions indicated.  We will give ophthalmology referral for follow-up of symptoms and continued evaluation and management.  Patient given eye patch per her request for supportive care, no other needs.  She is stable for discharge.  Educated on red flag symptoms that would prompt immediate return.  Patient is understanding and amenable with plan, discharged in stable condition.    Final Clinical Impression(s) / ED Diagnoses Final diagnoses:  Left eye pain    Rx / DC Orders ED Discharge Orders     None     An After Visit Summary was printed and given to the patient.     Nestor Lewandowsky 03/16/22 1932    Teressa Lower, MD 03/17/22 1316

## 2022-03-16 NOTE — Discharge Instructions (Signed)
As we discussed, your work-up in the ER today was reassuring for emergent concerns.  I have given you a referral to ophthalmology with a number to call to schedule an appointment for continued evaluation and management of your symptoms.  I also recommend that you continue to flush your eye regularly to help with irritation.  Return if development of any new or worsening symptoms.

## 2022-03-18 ENCOUNTER — Encounter: Payer: Self-pay | Admitting: Nurse Practitioner

## 2022-03-18 ENCOUNTER — Ambulatory Visit: Payer: BLUE CROSS/BLUE SHIELD | Attending: Nurse Practitioner | Admitting: Nurse Practitioner

## 2022-03-18 VITALS — BP 124/76 | HR 58 | Temp 98.0°F | Ht 64.0 in | Wt 124.0 lb

## 2022-03-18 DIAGNOSIS — J029 Acute pharyngitis, unspecified: Secondary | ICD-10-CM | POA: Diagnosis not present

## 2022-03-18 DIAGNOSIS — Z3202 Encounter for pregnancy test, result negative: Secondary | ICD-10-CM

## 2022-03-18 DIAGNOSIS — E876 Hypokalemia: Secondary | ICD-10-CM

## 2022-03-18 DIAGNOSIS — J4520 Mild intermittent asthma, uncomplicated: Secondary | ICD-10-CM

## 2022-03-18 DIAGNOSIS — Z1211 Encounter for screening for malignant neoplasm of colon: Secondary | ICD-10-CM

## 2022-03-18 DIAGNOSIS — Z3042 Encounter for surveillance of injectable contraceptive: Secondary | ICD-10-CM

## 2022-03-18 DIAGNOSIS — J302 Other seasonal allergic rhinitis: Secondary | ICD-10-CM

## 2022-03-18 DIAGNOSIS — F5101 Primary insomnia: Secondary | ICD-10-CM

## 2022-03-18 DIAGNOSIS — K219 Gastro-esophageal reflux disease without esophagitis: Secondary | ICD-10-CM | POA: Diagnosis not present

## 2022-03-18 DIAGNOSIS — R7989 Other specified abnormal findings of blood chemistry: Secondary | ICD-10-CM

## 2022-03-18 DIAGNOSIS — E559 Vitamin D deficiency, unspecified: Secondary | ICD-10-CM

## 2022-03-18 DIAGNOSIS — E785 Hyperlipidemia, unspecified: Secondary | ICD-10-CM

## 2022-03-18 LAB — POCT RAPID STREP A (OFFICE): Rapid Strep A Screen: NEGATIVE

## 2022-03-18 LAB — POCT URINE PREGNANCY: Preg Test, Ur: NEGATIVE

## 2022-03-18 MED ORDER — PANTOPRAZOLE SODIUM 40 MG PO TBEC: 40.0000 mg | DELAYED_RELEASE_TABLET | Freq: Two times a day (BID) | ORAL | 1 refills | Status: DC

## 2022-03-18 MED ORDER — MEDROXYPROGESTERONE ACETATE 150 MG/ML IM SUSP
150.0000 mg | Freq: Once | INTRAMUSCULAR | Status: AC
  Administered 2022-03-18: 150 mg via INTRAMUSCULAR

## 2022-03-18 MED ORDER — MIRTAZAPINE 7.5 MG PO TABS: 7.5000 mg | ORAL_TABLET | Freq: Every day | ORAL | 1 refills | Status: AC

## 2022-03-18 MED ORDER — CETIRIZINE HCL 10 MG PO TABS: 10.0000 mg | ORAL_TABLET | Freq: Every day | ORAL | 11 refills | Status: AC

## 2022-03-18 MED ORDER — ALBUTEROL SULFATE HFA 108 (90 BASE) MCG/ACT IN AERS: 1.0000 | INHALATION_SPRAY | Freq: Four times a day (QID) | RESPIRATORY_TRACT | 1 refills | Status: AC | PRN

## 2022-03-18 NOTE — Progress Notes (Signed)
Assessment & Plan:  Jenny Evans was seen today for contraception.  Diagnoses and all orders for this visit:  Encounter for Depo-Provera contraception -     POCT urine pregnancy -     medroxyPROGESTERone (DEPO-PROVERA) injection 150 mg  GERD without esophagitis -     Ambulatory referral to Gastroenterology -     pantoprazole (PROTONIX) 40 MG tablet; Take 1 tablet (40 mg total) by mouth 2 (two) times daily before a meal.  Colon cancer screening -     Ambulatory referral to Gastroenterology  Primary insomnia -     mirtazapine (REMERON) 7.5 MG tablet; Take 1 tablet (7.5 mg total) by mouth at bedtime. FOR INSOMNIA  Pharyngitis, unspecified etiology -     Rapid Strep A  Mild intermittent asthma without complication -     albuterol (VENTOLIN HFA) 108 (90 Base) MCG/ACT inhaler; Inhale 1-2 puffs into the lungs every 6 (six) hours as needed for wheezing or shortness of breath.  Seasonal allergies -     cetirizine (ZYRTEC) 10 MG tablet; Take 1 tablet (10 mg total) by mouth daily.  Abnormal TSH -     TSH -     T3, Free -     T4, Free  Hypokalemia -     CMP14+EGFR  Dyslipidemia, goal LDL below 100 -     Lipid panel  Vitamin D deficiency disease -     VITAMIN D 25 Hydroxy (Vit-D Deficiency, Fractures)    Patient has been counseled on age-appropriate routine health concerns for screening and prevention. These are reviewed and up-to-date. Referrals have been placed accordingly. Immunizations are up-to-date or declined.    Subjective:   Chief Complaint  Patient presents with   Contraception   HPI Jenny Evans 51 y.o. female presents to office today for depo provera injection and with complaint of cough x 3 weeks.    Cough:  The cough is non-productive, with shortness of breath and is aggravated by nothing Associated symptoms include: sore throat . Patient does not have new pets. Patient does have a history of asthma. Patient does have a history of environmental allergens. She  is not using an inhaler or antihistamine for either. Patient did not have recent travel. Patient does have a former history of smoking and states she stopped smoking 3 weeks ago. She also has a history of GERD and states pantoprazole 40 mg daily has been ineffective.  Symptoms have been associated with bilious reflux, deep pressure at base of neck, heartburn, need to clear throat frequently, and nocturnal burning.  She denies dysphagia.  She has not lost weight. She denies melena, hematochezia, hematemesis, and coffee ground emesis.    Primary Insomnia Does not feel trazodone has been effective. Still waking up during the night after only sleeping for a few hours. Will start remeron.   Review of Systems  Constitutional:  Negative for fever, malaise/fatigue and weight loss.  HENT:  Positive for sore throat. Negative for nosebleeds.   Eyes: Negative.  Negative for blurred vision, double vision and photophobia.  Respiratory:  Positive for cough and shortness of breath.   Cardiovascular: Negative.  Negative for chest pain, palpitations and leg swelling.  Gastrointestinal:  Positive for heartburn. Negative for nausea and vomiting.  Musculoskeletal: Negative.  Negative for myalgias.  Neurological: Negative.  Negative for dizziness, focal weakness, seizures and headaches.  Endo/Heme/Allergies:  Positive for environmental allergies.  Psychiatric/Behavioral:  Negative for suicidal ideas. The patient has insomnia.     Past Medical  History:  Diagnosis Date   Anemia    Asthma    Chronic pain    GERD (gastroesophageal reflux disease)    Migraine    Peptic ulcer    Sickle cell trait ([place])    Ulcer     Past Surgical History:  Procedure Laterality Date   ESOPHAGOGASTRODUODENOSCOPY (EGD) WITH PROPOFOL N/A 04/04/2013   Procedure: ESOPHAGOGASTRODUODENOSCOPY (EGD) WITH PROPOFOL;  Surgeon: Arta Silence, MD;  Location: [facility];  Service: Endoscopy;  Laterality: N/A;   EXAMINATION UNDER  ANESTHESIA N/A 08/10/2013   Procedure: RECTAL EXAM UNDER ANESTHESIA;  Surgeon: Joyice Faster. Cornett, MD;  Location: [place];  Service: General;  Laterality: N/A;  ligation   HEMORRHOID SURGERY     MYRINGOTOMY     age 24    Family History  Problem Relation Age of Onset   Heart disease Mother        Arrythmia   Kidney disease Mother    Hypertension Mother    COPD Mother    Diabetes Mother    Lung cancer Mother    Heart disease Father    Crohn's disease Maternal Aunt    Breast cancer Maternal Aunt        40's    Social History Reviewed with no changes to be made today.   Outpatient Medications Prior to Visit  Medication Sig Dispense Refill   carbamazepine (CARBATROL) 100 MG 12 hr capsule Take 1 capsule (100 mg total) by mouth daily. 30 capsule 1   folic acid (FOLVITE) 1 MG tablet Take 1 tablet (1 mg total) by mouth daily. 30 tablet 11   traZODone (DESYREL) 150 MG tablet Take 1 tablet (150 mg total) by mouth at bedtime. 90 tablet 0   pantoprazole (PROTONIX) 40 MG tablet Take 1 tablet (40 mg total) by mouth daily. (Patient not taking: Reported on 03/18/2022) 30 tablet 3   No facility-administered medications prior to visit.    Allergies  Allergen Reactions   Hydrocodone-Acetaminophen Nausea And Vomiting    Patient can tolerate acetaminophen       Objective:    BP 124/76   Pulse (!) 58   Temp 98 F (36.7 C) (Oral)   Ht 5' 4"  (1.626 m)   Wt 124 lb (56.2 kg)   LMP 03/10/2022   SpO2 98%   BMI 21.28 kg/m  Wt Readings from Last 3 Encounters:  03/18/22 124 lb (56.2 kg)  03/16/22 135 lb (61.2 kg)  01/22/22 132 lb 6.4 oz (60.1 kg)    Physical Exam Vitals and nursing note reviewed.  Constitutional:      Appearance: She is well-developed.  HENT:     Head: Normocephalic and atraumatic.  Cardiovascular:     Rate and Rhythm: Normal rate and regular rhythm.     Heart sounds: Normal heart sounds. No murmur heard.    No friction rub. No gallop.  Pulmonary:      Effort: Pulmonary effort is normal. No tachypnea or respiratory distress.     Breath sounds: Normal breath sounds. Decreased air movement present. No decreased breath sounds, wheezing, rhonchi or rales.  Chest:     Chest wall: No tenderness.  Abdominal:     General: Bowel sounds are normal.     Palpations: Abdomen is soft.  Musculoskeletal:        General: Normal range of motion.     Cervical back: Normal range of motion.  Skin:    General: Skin is warm and dry.  Neurological:  Mental Status: She is alert and oriented to person, place, and time.     Coordination: Coordination normal.  Psychiatric:        Behavior: Behavior normal. Behavior is cooperative.        Thought Content: Thought content normal.        Judgment: Judgment normal.          Patient has been counseled extensively about nutrition and exercise as well as the importance of adherence with medications and regular follow-up. The patient was given clear instructions to go to ER or return to medical center if symptoms don't improve, worsen or new problems develop. The patient verbalized understanding.   Follow-up: Return if symptoms worsen or fail to improve.   Gildardo Pounds, FNP-BC [facility] [place] [place]   03/18/2022, 1:40 PM

## 2022-03-19 ENCOUNTER — Other Ambulatory Visit: Payer: Self-pay | Admitting: Nurse Practitioner

## 2022-03-19 ENCOUNTER — Other Ambulatory Visit: Payer: Self-pay | Admitting: Family Medicine

## 2022-03-19 DIAGNOSIS — E559 Vitamin D deficiency, unspecified: Secondary | ICD-10-CM

## 2022-03-19 DIAGNOSIS — G500 Trigeminal neuralgia: Secondary | ICD-10-CM

## 2022-03-19 LAB — CMP14+EGFR
ALT: 18 IU/L (ref 0–32)
AST: 21 IU/L (ref 0–40)
Albumin/Globulin Ratio: 2 (ref 1.2–2.2)
Albumin: 4.6 g/dL (ref 3.9–4.9)
Alkaline Phosphatase: 64 IU/L (ref 44–121)
BUN/Creatinine Ratio: 10 (ref 9–23)
BUN: 7 mg/dL (ref 6–24)
Bilirubin Total: 0.4 mg/dL (ref 0.0–1.2)
CO2: 21 mmol/L (ref 20–29)
Calcium: 10.2 mg/dL (ref 8.7–10.2)
Chloride: 100 mmol/L (ref 96–106)
Creatinine, Ser: 0.71 mg/dL (ref 0.57–1.00)
Globulin, Total: 2.3 g/dL (ref 1.5–4.5)
Glucose: 86 mg/dL (ref 70–99)
Potassium: 3.5 mmol/L (ref 3.5–5.2)
Sodium: 136 mmol/L (ref 134–144)
Total Protein: 6.9 g/dL (ref 6.0–8.5)
eGFR: 104 mL/min/{1.73_m2} (ref >59)

## 2022-03-19 LAB — TSH: TSH: 0.676 u[IU]/mL (ref 0.450–4.500)

## 2022-03-19 LAB — LIPID PANEL
Chol/HDL Ratio: 4 ratio (ref 0.0–4.4)
Cholesterol, Total: 186 mg/dL (ref 100–199)
HDL: 47 mg/dL (ref >39)
LDL Chol Calc (NIH): 125 mg/dL — ABNORMAL HIGH (ref 0–99)
Triglycerides: 76 mg/dL (ref 0–149)
VLDL Cholesterol Cal: 14 mg/dL (ref 5–40)

## 2022-03-19 LAB — T3, FREE: T3, Free: 3.4 pg/mL (ref 2.0–4.4)

## 2022-03-19 LAB — VITAMIN D 25 HYDROXY (VIT D DEFICIENCY, FRACTURES): Vit D, 25-Hydroxy: 10 ng/mL — ABNORMAL LOW (ref 30.0–100.0)

## 2022-03-19 LAB — T4, FREE: Free T4: 1.22 ng/dL (ref 0.82–1.77)

## 2022-03-19 MED ORDER — VITAMIN D (ERGOCALCIFEROL) 1.25 MG (50000 UNIT) PO CAPS: 50000.0000 [IU] | ORAL_CAPSULE | ORAL | 1 refills | Status: AC

## 2022-05-23 ENCOUNTER — Inpatient Hospital Stay: Payer: BLUE CROSS/BLUE SHIELD | Attending: Nurse Practitioner

## 2022-05-23 ENCOUNTER — Inpatient Hospital Stay: Payer: BLUE CROSS/BLUE SHIELD | Admitting: Family

## 2022-09-10 ENCOUNTER — Other Ambulatory Visit: Payer: Self-pay | Admitting: Nurse Practitioner

## 2022-09-10 DIAGNOSIS — K219 Gastro-esophageal reflux disease without esophagitis: Secondary | ICD-10-CM

## 2022-09-10 NOTE — Telephone Encounter (Unsigned)
Copied from [place] [id number]. Topic: General - Other >> Sep 10, 2022 12:15 PM Everette C wrote: Reason for CRM: Medication Refill - Medication: pantoprazole (PROTONIX) 40 MG tablet [id number]  meloxicam (MOBIC) 15 MG tablet [id number]  Has the patient contacted their pharmacy? Yes.   (Agent: If no, request that the patient contact the pharmacy for the refill. If patient does not wish to contact the pharmacy document the reason why and proceed with request.) (Agent: If yes, when and what did the pharmacy advise?)  Preferred Pharmacy (with phone number or street name): [facility] [place] [address] [place] [address] Phone: [phone] Fax: [phone] Hours: Not open 24 hours   Has the patient been seen for an appointment in the last year OR does the patient have an upcoming appointment? Yes.    Agent: Please be advised that RX refills may take up to 3 business days. We ask that you follow-up with your pharmacy.  The patient would like to be contacted by a member of clinical staff regarding the submission of their prescriptions when possible

## 2022-09-11 MED ORDER — PANTOPRAZOLE SODIUM 40 MG PO TBEC: 40.0000 mg | DELAYED_RELEASE_TABLET | Freq: Two times a day (BID) | ORAL | 0 refills | Status: DC

## 2022-09-11 NOTE — Telephone Encounter (Signed)
Requested Prescriptions  Pending Prescriptions Disp Refills   pantoprazole (PROTONIX) 40 MG tablet 180 tablet 0    Sig: Take 1 tablet (40 mg total) by mouth 2 (two) times daily before a meal.     Gastroenterology: Proton Pump Inhibitors Passed - 09/10/2022  2:27 PM      Passed - Valid encounter within last 12 months    Recent Outpatient Visits           5 months ago Encounter for Depo-Provera contraception   [place], Vernia Buff, NP   9 months ago Encounter to establish care   [place] Gildardo Pounds, NP   5 years ago Breast lump in female   [place], Roger David, PA-C   5 years ago Chronic maxillary sinusitis   [place], PA-C               meloxicam (MOBIC) 15 MG tablet 30 tablet 3    Sig: One tab PO qAM with breakfast for 2 weeks, then daily prn pain.     Analgesics:  COX2 Inhibitors Failed - 09/10/2022  2:27 PM      Failed - Manual Review: Labs are only required if the patient has taken medication for more than 8 weeks.      Failed - HGB in normal range and within 360 days    Hemoglobin  Date Value Ref Range Status  01/22/2022 11.8 (L) 12.0 - 15.0 g/dL Final  07/01/2017 9.5 (L) 11.1 - 15.9 g/dL Final         Failed - HCT in normal range and within 360 days    HCT  Date Value Ref Range Status  01/22/2022 34.7 (L) 36.0 - 46.0 % Final   Hematocrit  Date Value Ref Range Status  07/01/2017 29.3 (L) 34.0 - 46.6 % Final         Passed - Cr in normal range and within 360 days    Creatinine  Date Value Ref Range Status  11/22/2021 0.77 0.44 - 1.00 mg/dL Final   Creatinine, Ser  Date Value Ref Range Status  03/18/2022 0.71 0.57 - 1.00 mg/dL Final         Passed - AST in normal range and within 360 days    AST  Date Value Ref Range Status  03/18/2022 21 0 - 40 IU/L Final  11/22/2021 16 15 - 41  U/L Final         Passed - ALT in normal range and within 360 days    ALT  Date Value Ref Range Status  03/18/2022 18 0 - 32 IU/L Final  11/22/2021 11 0 - 44 U/L Final         Passed - eGFR is 30 or above and within 360 days    GFR calc Af Amer  Date Value Ref Range Status  04/02/2019 >60 >60 mL/min Final   GFR, Estimated  Date Value Ref Range Status  11/22/2021 >60 >60 mL/min Final    Comment:    (NOTE) Calculated using the CKD-EPI Creatinine Equation (2021)    eGFR  Date Value Ref Range Status  03/18/2022 104 >59 mL/min/1.73 Final         Passed - Patient is not pregnant      Passed - Valid encounter within last 12 months    Recent Outpatient Visits  5 months ago Encounter for Depo-Provera contraception   [place], Vernia Buff, NP   9 months ago Encounter to establish care   [facility] Gildardo Pounds, NP   5 years ago Breast lump in female   [place], Roger David, PA-C   5 years ago Chronic maxillary sinusitis   [place], Roger David, PA-C

## 2022-09-29 ENCOUNTER — Other Ambulatory Visit: Payer: Self-pay | Admitting: Nurse Practitioner

## 2022-09-29 ENCOUNTER — Encounter: Payer: Self-pay | Admitting: *Deleted

## 2022-09-29 DIAGNOSIS — K219 Gastro-esophageal reflux disease without esophagitis: Secondary | ICD-10-CM

## 2022-09-30 ENCOUNTER — Other Ambulatory Visit (HOSPITAL_COMMUNITY): Payer: Self-pay

## 2022-09-30 ENCOUNTER — Other Ambulatory Visit: Payer: Self-pay

## 2022-09-30 ENCOUNTER — Encounter: Payer: Self-pay | Admitting: Family

## 2022-09-30 MED ORDER — PANTOPRAZOLE SODIUM 40 MG PO TBEC
40.0000 mg | DELAYED_RELEASE_TABLET | Freq: Two times a day (BID) | ORAL | 0 refills | Status: AC
  Filled 2022-09-30 – 2022-10-02 (×2): qty 180, 90d supply, fill #0

## 2022-10-01 ENCOUNTER — Other Ambulatory Visit: Payer: Self-pay

## 2022-10-02 ENCOUNTER — Other Ambulatory Visit: Payer: Self-pay

## 2022-10-02 ENCOUNTER — Other Ambulatory Visit (HOSPITAL_COMMUNITY): Payer: Self-pay

## 2022-10-07 ENCOUNTER — Other Ambulatory Visit: Payer: Self-pay | Admitting: *Deleted

## 2022-10-07 MED ORDER — MELOXICAM 15 MG PO TABS: 15.0000 mg | ORAL_TABLET | Freq: Every day | ORAL | 2 refills | Status: AC | PRN

## 2022-10-22 ENCOUNTER — Ambulatory Visit: Payer: BLUE CROSS/BLUE SHIELD | Admitting: Physician Assistant

## 2022-11-05 ENCOUNTER — Other Ambulatory Visit (HOSPITAL_BASED_OUTPATIENT_CLINIC_OR_DEPARTMENT_OTHER): Payer: Self-pay

## 2022-11-05 ENCOUNTER — Other Ambulatory Visit (HOSPITAL_COMMUNITY): Payer: Self-pay

## 2022-11-05 ENCOUNTER — Other Ambulatory Visit: Payer: Self-pay | Admitting: Nurse Practitioner

## 2022-11-05 DIAGNOSIS — K219 Gastro-esophageal reflux disease without esophagitis: Secondary | ICD-10-CM

## 2022-11-14 ENCOUNTER — Other Ambulatory Visit (HOSPITAL_COMMUNITY): Payer: Self-pay

## 2023-01-30 ENCOUNTER — Other Ambulatory Visit: Payer: Self-pay

## 2024-03-16 ENCOUNTER — Other Ambulatory Visit (HOSPITAL_BASED_OUTPATIENT_CLINIC_OR_DEPARTMENT_OTHER): Payer: Self-pay

## 2024-06-18 ENCOUNTER — Encounter (HOSPITAL_BASED_OUTPATIENT_CLINIC_OR_DEPARTMENT_OTHER): Payer: Self-pay | Admitting: Emergency Medicine

## 2024-06-18 ENCOUNTER — Encounter: Payer: Self-pay | Admitting: Family

## 2024-06-18 ENCOUNTER — Emergency Department (HOSPITAL_BASED_OUTPATIENT_CLINIC_OR_DEPARTMENT_OTHER)
Admission: EM | Admit: 2024-06-18 | Discharge: 2024-06-18 | Disposition: A | Discharge status: Home / Self Care | Attending: Emergency Medicine | Admitting: Emergency Medicine

## 2024-06-18 ENCOUNTER — Other Ambulatory Visit: Payer: Self-pay

## 2024-06-18 ENCOUNTER — Emergency Department (HOSPITAL_BASED_OUTPATIENT_CLINIC_OR_DEPARTMENT_OTHER)

## 2024-06-18 DIAGNOSIS — M25512 Pain in left shoulder: Secondary | ICD-10-CM | POA: Diagnosis present

## 2024-06-18 DIAGNOSIS — G8929 Other chronic pain: Secondary | ICD-10-CM | POA: Insufficient documentation

## 2024-06-18 MED ORDER — FAMOTIDINE 40 MG PO TABS: 40.0000 mg | ORAL_TABLET | Freq: Every day | ORAL | 0 refills | Status: AC

## 2024-06-18 MED ORDER — KETOROLAC TROMETHAMINE 10 MG PO TABS: 10.0000 mg | ORAL_TABLET | Freq: Four times a day (QID) | ORAL | 0 refills | Status: AC | PRN

## 2024-06-18 MED ORDER — KETOROLAC TROMETHAMINE 60 MG/2ML IM SOLN
30.0000 mg | Freq: Once | INTRAMUSCULAR | Status: AC
  Administered 2024-06-18: 30 mg via INTRAMUSCULAR
  Filled 2024-06-18: qty 2

## 2024-06-18 NOTE — ED Provider Notes (Signed)
 " [place] [facility] Provider Note   CSN: [id number] Arrival date & time: 06/18/24  1515     Patient presents with: Shoulder Pain   Lexus Z Wentz is a 54 y.o. female.   54 year old female here today for acute on chronic shoulder pain.  Patient has a history of rotator cuff injury, has an appointment with her orthopedic doctor on Thursday.  She reports that she was helping her sister move, feels like she might of overdone it.  She states that she has got some tight muscles in her shoulder.   Shoulder Pain      Prior to Admission medications  Medication Sig Start Date End Date Taking? Authorizing Provider  albuterol  (VENTOLIN  HFA) 108 (90 Base) MCG/ACT inhaler Inhale 1-2 puffs into the lungs every 6 (six) hours as needed for wheezing or shortness of breath. 03/18/22   Fleming, Zelda W, NP  carbamazepine  (CARBATROL ) 100 MG 12 hr capsule TAKE 1 CAPSULE(100 MG) BY MOUTH DAILY 03/19/22   Chick Venetia BRAVO, MD  cetirizine  (ZYRTEC ) 10 MG tablet Take 1 tablet (10 mg total) by mouth daily. 03/18/22   Fleming, Zelda W, NP  folic acid  (FOLVITE ) 1 MG tablet Take 1 tablet (1 mg total) by mouth daily. 11/22/21   Franchot Lauraine HERO, NP  meloxicam  (MOBIC ) 15 MG tablet Take 1 tablet (15 mg total) by mouth daily as needed for pain. 10/07/22   Chick Venetia BRAVO, MD  mirtazapine  (REMERON ) 7.5 MG tablet Take 1 tablet (7.5 mg total) by mouth at bedtime. FOR INSOMNIA 03/18/22   Fleming, Zelda W, NP  pantoprazole  (PROTONIX ) 40 MG tablet Take 1 tablet (40 mg total) by mouth 2 (two) times daily before a meal. 09/30/22   Theotis Haze ORN, NP  Vitamin D , Ergocalciferol , (DRISDOL ) 1.25 MG (50000 UNIT) CAPS capsule Take 1 capsule (50,000 Units total) by mouth every 7 (seven) days. 03/19/22   Fleming, Zelda W, NP  sodium chloride  (OCEAN) 0.65 % SOLN nasal spray Place 1 spray into both nostrils as needed for congestion. Patient not taking: Reported on 03/24/2019 12/14/18 09/22/19  Theophilus Shasta SQUIBB, PA-C    Allergies: Hydrocodone -acetaminophen     Review of Systems  Updated Vital Signs BP 132/84 (BP Location: Right Arm)   Pulse (!) 102   Temp 97.9 F (36.6 C) (Oral)   Resp 16   Ht 5' 4 (1.626 m)   Wt 64.9 kg   LMP 06/11/2024   SpO2 99%   BMI 24.55 kg/m   Physical Exam Vitals and nursing note reviewed.  Pulmonary:     Effort: Pulmonary effort is normal.  Musculoskeletal:        General: Normal range of motion.     Comments: Left trapezius hypertonicity.  No deformity.  No squared off shoulder.  No crepitus.  Neurological:     Mental Status: She is alert.     Comments: Intact median, radial, ulnar nerve sensation and function.     (all labs ordered are listed, but only abnormal results are displayed) Labs Reviewed - No data to display  EKG: None  Radiology: No results found.   Procedures   Medications Ordered in the ED  ketorolac  (TORADOL ) injection 30 mg (has no administration in time range)                                    Medical Decision Making 55 year old female here  today for left shoulder pain.  Differential diagnoses include musculoskeletal pain, less likely ACS, less likely dissection.  Plan- patient with area of point tenderness in her left trapezius.  There is hypertonicity in this area.  Will provide her with some Toradol , a sling for comfort.  She follows up with her orthopedic doctor Thursday.  Will send a prescription for Toradol  and famotidine .  Amount and/or Complexity of Data Reviewed Radiology: ordered.  Risk Prescription drug management.        Final diagnoses:  None    ED Discharge Orders     None          Mannie Fairy DASEN, DO 06/18/24 1630  "

## 2024-06-18 NOTE — ED Triage Notes (Signed)
 Pt c/o pain and knot to LT shoulder x 2 wks; reports hx of torn rotator cuff; has appt with ortho Thurs; no new injury
